# Patient Record
Sex: Female | Born: 1945 | Race: White | Hispanic: No | Smoking: Never smoker
Health system: Southern US, Community
[De-identification: ages and names within clinical notes are randomized; demographics above are authoritative.]

## PROBLEM LIST (undated history)

## (undated) ENCOUNTER — Emergency Department: Admission: EM | Payer: Medicare PPO | Source: Home / Self Care

## (undated) DIAGNOSIS — N2889 Other specified disorders of kidney and ureter: Secondary | ICD-10-CM

## (undated) DIAGNOSIS — E119 Type 2 diabetes mellitus without complications: Secondary | ICD-10-CM

## (undated) DIAGNOSIS — F32A Depression, unspecified: Secondary | ICD-10-CM

## (undated) DIAGNOSIS — I4891 Unspecified atrial fibrillation: Secondary | ICD-10-CM

## (undated) DIAGNOSIS — Z87442 Personal history of urinary calculi: Secondary | ICD-10-CM

## (undated) HISTORY — PX: GASTRIC BYPASS: SHX52

## (undated) HISTORY — DX: Personal history of urinary calculi: Z87.442

## (undated) HISTORY — PX: TONSILLECTOMY: SUR1361

## (undated) HISTORY — DX: Other specified disorders of kidney and ureter: N28.89

## (undated) HISTORY — DX: Type 2 diabetes mellitus without complications: E11.9

## (undated) HISTORY — PX: HERNIA REPAIR: SHX51

## (undated) HISTORY — DX: Depression, unspecified: F32.A

## (undated) HISTORY — DX: Unspecified atrial fibrillation: I48.91

## (undated) HISTORY — PX: URETEROSCOPY WITH HOLMIUM LASER LITHOTRIPSY: SHX6645

---

## 2020-06-20 ENCOUNTER — Emergency Department
Admission: EM | Admit: 2020-06-20 | Discharge: 2020-06-23 | Disposition: A | Discharge status: Home / Self Care | Payer: Medicare PPO | Attending: Emergency Medicine | Admitting: Emergency Medicine

## 2020-06-20 ENCOUNTER — Other Ambulatory Visit: Payer: Self-pay

## 2020-06-20 ENCOUNTER — Emergency Department: Payer: Medicare PPO

## 2020-06-20 DIAGNOSIS — Z7984 Long term (current) use of oral hypoglycemic drugs: Secondary | ICD-10-CM | POA: Diagnosis not present

## 2020-06-20 DIAGNOSIS — R410 Disorientation, unspecified: Secondary | ICD-10-CM | POA: Insufficient documentation

## 2020-06-20 DIAGNOSIS — R2681 Unsteadiness on feet: Secondary | ICD-10-CM | POA: Insufficient documentation

## 2020-06-20 DIAGNOSIS — Z7901 Long term (current) use of anticoagulants: Secondary | ICD-10-CM | POA: Diagnosis not present

## 2020-06-20 DIAGNOSIS — R4182 Altered mental status, unspecified: Secondary | ICD-10-CM | POA: Diagnosis present

## 2020-06-20 DIAGNOSIS — M6281 Muscle weakness (generalized): Secondary | ICD-10-CM | POA: Insufficient documentation

## 2020-06-20 DIAGNOSIS — R739 Hyperglycemia, unspecified: Secondary | ICD-10-CM | POA: Diagnosis not present

## 2020-06-20 DIAGNOSIS — Z794 Long term (current) use of insulin: Secondary | ICD-10-CM | POA: Diagnosis not present

## 2020-06-20 LAB — BASIC METABOLIC PANEL
Anion gap: 10 (ref 5–15)
BUN: 12 mg/dL (ref 8–23)
CO2: 26 mmol/L (ref 22–32)
Calcium: 10.2 mg/dL (ref 8.9–10.3)
Chloride: 98 mmol/L (ref 98–111)
Creatinine, Ser: 0.58 mg/dL (ref 0.44–1.00)
GFR, Estimated: >60 mL/min (ref >60)
Glucose, Bld: 303 mg/dL — ABNORMAL HIGH (ref 70–99)
Potassium: 4.6 mmol/L (ref 3.5–5.1)
Sodium: 134 mmol/L — ABNORMAL LOW (ref 135–145)

## 2020-06-20 LAB — URINALYSIS, COMPLETE (UACMP) WITH MICROSCOPIC
Bilirubin Urine: NEGATIVE
Glucose, UA: >=500 mg/dL — AB
Hgb urine dipstick: NEGATIVE
Ketones, ur: NEGATIVE mg/dL
Leukocytes,Ua: NEGATIVE
Nitrite: NEGATIVE
Protein, ur: NEGATIVE mg/dL
Specific Gravity, Urine: 1.016 (ref 1.005–1.030)
pH: 6 (ref 5.0–8.0)

## 2020-06-20 LAB — CBC
HCT: 39.4 % (ref 36.0–46.0)
Hemoglobin: 12.4 g/dL (ref 12.0–15.0)
MCH: 26.3 pg (ref 26.0–34.0)
MCHC: 31.5 g/dL (ref 30.0–36.0)
MCV: 83.7 fL (ref 80.0–100.0)
Platelets: 195 10*3/uL (ref 150–400)
RBC: 4.71 MIL/uL (ref 3.87–5.11)
RDW: 15.7 % — ABNORMAL HIGH (ref 11.5–15.5)
WBC: 6.5 10*3/uL (ref 4.0–10.5)
nRBC: 0 % (ref 0.0–0.2)

## 2020-06-20 LAB — GLUCOSE, CAPILLARY
Glucose-Capillary: 275 mg/dL — ABNORMAL HIGH (ref 70–99)
Glucose-Capillary: 302 mg/dL — ABNORMAL HIGH (ref 70–99)
Glucose-Capillary: 332 mg/dL — ABNORMAL HIGH (ref 70–99)

## 2020-06-20 MED ORDER — INSULIN ASPART 100 UNIT/ML ~~LOC~~ SOLN: 0.0000 [IU] | Freq: Three times a day (TID) | SUBCUTANEOUS | Status: DC

## 2020-06-20 MED ORDER — MELATONIN 5 MG PO TABS
5.0000 mg | ORAL_TABLET | Freq: Every evening | ORAL | Status: DC | PRN
  Filled 2020-06-20: qty 1

## 2020-06-20 MED ORDER — BISACODYL 5 MG PO TBEC: 5.0000 mg | DELAYED_RELEASE_TABLET | Freq: Every day | ORAL | Status: DC | PRN

## 2020-06-20 MED ORDER — FLUOXETINE HCL 10 MG PO CAPS
10.0000 mg | ORAL_CAPSULE | Freq: Every day | ORAL | Status: DC
  Administered 2020-06-20 – 2020-06-22 (×3): 10 mg via ORAL
  Filled 2020-06-20 (×4): qty 1

## 2020-06-20 MED ORDER — ACETAMINOPHEN ER 650 MG PO TBCR: 650.0000 mg | EXTENDED_RELEASE_TABLET | ORAL | Status: DC | PRN

## 2020-06-20 MED ORDER — MELATONIN 3 MG PO TABS: 3.0000 mg | ORAL_TABLET | Freq: Every evening | ORAL | Status: DC | PRN

## 2020-06-20 MED ORDER — HALOPERIDOL LACTATE 5 MG/ML IJ SOLN: 1.0000 mg | Freq: Once | INTRAMUSCULAR | Status: AC

## 2020-06-20 MED ORDER — HALOPERIDOL LACTATE 5 MG/ML IJ SOLN: 1.0000 mg | Freq: Once | INTRAMUSCULAR | Status: DC

## 2020-06-20 MED ORDER — INSULIN GLARGINE 100 UNIT/ML ~~LOC~~ SOLN
20.0000 [IU] | Freq: Every day | SUBCUTANEOUS | Status: DC
  Administered 2020-06-21 – 2020-06-23 (×3): 20 [IU] via SUBCUTANEOUS
  Filled 2020-06-20 (×4): qty 0.2

## 2020-06-20 MED ORDER — INSULIN ASPART 100 UNIT/ML ~~LOC~~ SOLN
0.0000 [IU] | Freq: Three times a day (TID) | SUBCUTANEOUS | Status: DC
  Administered 2020-06-21: 2 [IU] via SUBCUTANEOUS
  Administered 2020-06-21 – 2020-06-22 (×3): 5 [IU] via SUBCUTANEOUS
  Administered 2020-06-22: 2 [IU] via SUBCUTANEOUS
  Administered 2020-06-23: 1 [IU] via SUBCUTANEOUS
  Filled 2020-06-20 (×6): qty 1

## 2020-06-20 MED ORDER — ACETAMINOPHEN 325 MG PO TABS: 650.0000 mg | ORAL_TABLET | ORAL | Status: DC | PRN

## 2020-06-20 MED ORDER — HALOPERIDOL LACTATE 5 MG/ML IJ SOLN
INTRAMUSCULAR | Status: AC
  Administered 2020-06-20: 1 mg via INTRAMUSCULAR
  Filled 2020-06-20: qty 1

## 2020-06-20 MED ORDER — GLIMEPIRIDE 4 MG PO TABS
4.0000 mg | ORAL_TABLET | Freq: Every day | ORAL | Status: DC
  Administered 2020-06-21 – 2020-06-23 (×3): 4 mg via ORAL
  Filled 2020-06-20 (×4): qty 1

## 2020-06-20 MED ORDER — INSULIN ASPART 100 UNIT/ML ~~LOC~~ SOLN: 0.0000 [IU] | Freq: Every day | SUBCUTANEOUS | Status: DC

## 2020-06-20 MED ORDER — ONDANSETRON HCL 4 MG PO TABS: 4.0000 mg | ORAL_TABLET | Freq: Four times a day (QID) | ORAL | Status: DC | PRN

## 2020-06-20 MED ORDER — METFORMIN HCL ER 500 MG PO TB24
500.0000 mg | ORAL_TABLET | Freq: Every day | ORAL | Status: DC
  Administered 2020-06-21 – 2020-06-23 (×3): 500 mg via ORAL
  Filled 2020-06-20 (×4): qty 1

## 2020-06-20 MED ORDER — INSULIN LISPRO 100 UNIT/ML ~~LOC~~ SOLN: 0.0000 [IU] | Freq: Three times a day (TID) | SUBCUTANEOUS | Status: DC

## 2020-06-20 MED ORDER — METOPROLOL SUCCINATE ER 50 MG PO TB24
50.0000 mg | ORAL_TABLET | Freq: Every day | ORAL | Status: DC
  Administered 2020-06-20 – 2020-06-22 (×3): 50 mg via ORAL
  Filled 2020-06-20 (×2): qty 1

## 2020-06-20 NOTE — ED Triage Notes (Signed)
Patient was sent to assisted living today as a new patient, patient arrived to facility with AMS and elevated BGL. Patient alert to person. Patient agitated with EMS and uncooperative for vitals.

## 2020-06-20 NOTE — ED Provider Notes (Signed)
[facility] Emergency Department Provider Note ____________________________________________   First MD Initiated Contact with Patient 06/20/20 1542     (approximate)  I have reviewed the triage vital signs and the nursing notes.   HISTORY  Chief Complaint Altered Mental Status  EM Caveat: confusion  HPI Barbara Frank is a 74 y.o. female for evaluation after she arrived to an assisted living facility from outside hospital.  Patient tells me that she arrived to a new apartment, a bunch of people showed up and "kidnapped her" taking her to the hospital.  She does tell me she left the hospital earlier today and was brought to [facility] for her new home and her niece  She denies any acute concerns, she tells me she not any pain or discomfort.  She wants to be able to leave soon, but is starting life in a new apartment here in [place].  She does tell me she just left the hospital and was brought from the hospital to her new apartment which is assisted living  No past medical history on file.  There are no problems to display for this patient.     Prior to Admission medications   Medication Sig Start Date End Date Taking? Authorizing Provider  acetaminophen (TYLENOL) 650 MG CR tablet Take 650 mg by mouth every 4 (four) hours as needed for pain or fever.   Yes [provider]  bisacodyl (DULCOLAX) 5 MG EC tablet Take 5 mg by mouth daily as needed for moderate constipation.   Yes [provider]  FLUoxetine (PROZAC) 10 MG capsule Take 10 mg by mouth daily.   Yes [provider]  glimepiride (AMARYL) 4 MG tablet Take 4 mg by mouth daily with breakfast.   Yes [provider]  insulin glargine (LANTUS) 100 UNIT/ML injection Inject 20 Units into the skin daily.   Yes [provider]  insulin lispro (HUMALOG) 100 UNIT/ML injection Inject 0-8 Units into the skin 4 (four) times daily -  with meals and at bedtime. Sliding  scale 0-8 units (No coverage for blood sugars less than 250. Blood sugar 251-300 4 units, 301-350 6 units, 351-400 8 units, more than 400 call MD   Yes [provider]  melatonin 3 MG TABS tablet Take 3 mg by mouth at bedtime as needed (insomnia).   Yes [provider]  metFORMIN (GLUMETZA) 500 MG (MOD) 24 hr tablet Take 500 mg by mouth daily with breakfast.   Yes [provider]  metoprolol succinate (TOPROL-XL) 50 MG 24 hr tablet Take 50 mg by mouth daily. Take with or immediately following a meal.   Yes [provider]  ondansetron (ZOFRAN) 4 MG tablet Take 4 mg by mouth every 6 (six) hours as needed for nausea or vomiting.   Yes [provider]  warfarin (COUMADIN) 10 MG tablet Take 10 mg by mouth daily. Monday, Wednesday, Friday, Saturday, Sunday.   Yes [provider]  warfarin (COUMADIN) 7.5 MG tablet Take 7.5 mg by mouth daily. Tuesday, Thursday   Yes [provider]   Patient came from [facility] in [place] today per [name] (Niece). Patient will be staying at [facility] apartments starting today. Her glucose was > 400 at assisted living. Niece is guardian recently. Patient was just released for stomach pains and discharged today. She had a kidney stone at that time. Also found a possible renal mass and they recommended she see an oncologist in the future.  Niece reports  now guardian, patient recently depressed, and worsening condition since daughter died. No known history of dementia.   Allergies Patient has no known allergies.  No family history on file.  Social History Social History   Tobacco Use  . Smoking status: Not on file  Substance Use Topics  . Alcohol use: Not on file  . Drug use: Not on file  Does not actively smoke or use illegal drugs  Review of Systems Constitutional: No fever/chills reports recently had a stone treated at [facility] and just left that hospital Eyes: No visual  changes. ENT: No sore throat. Cardiovascular: Denies chest pain. Respiratory: Denies shortness of breath. Gastrointestinal: No abdominal pain.   Genitourinary: Negative for dysuria. Musculoskeletal: Negative for back pain. Skin: Negative for rash. Neurological: Negative for headaches, areas of focal weakness or numbness.    ____________________________________________   PHYSICAL EXAM:  VITAL SIGNS: ED Triage Vitals [06/20/20 1457]  Enc Vitals Group     BP      Pulse      Resp      Temp      Temp src      SpO2      Weight      Height      Head Circumference      Peak Flow      Pain Score 0     Pain Loc      Pain Edu?      Excl. in [place]?     Constitutional: Alert and oriented. Well appearing and in no acute distress.  Oriented to being at the hospital, being sent here from her new facility, also to month and date but not to year.  She reports unsure of the year Eyes: Conjunctivae are normal. Head: Atraumatic. Nose: No congestion/rhinnorhea. Mouth/Throat: Mucous membranes are somewhat dry. Neck: No stridor.  Cardiovascular: Normal rate, regular rhythm. Grossly normal heart sounds.  Good peripheral circulation. Respiratory: Normal respiratory effort.  No retractions. Lungs CTAB. Gastrointestinal: Soft and nontender. No distention. Musculoskeletal: No lower extremity tenderness nor edema. Neurologic:  Normal speech and language. No gross focal neurologic deficits are appreciated.  Skin:  Skin is warm, dry and intact. No rash noted. Psychiatric: Mood and affect are slightly anxious, calm and easily when spoken to and allows examination with her permission.  She has a Actuary at the bedside.  She does seem a little bit irritable, or upset easily, but participates in exam denies pain denies being any discomfort.  She is not fully oriented and that she is not quite sure of the year  ____________________________________________   LABS (all labs ordered are listed, but only  abnormal results are displayed)  Labs Reviewed  CBC - Abnormal; Notable for the following components:      Result Value   RDW 15.7 (*)    All other components within normal limits  URINALYSIS, COMPLETE (UACMP) WITH MICROSCOPIC - Abnormal; Notable for the following components:   Color, Urine YELLOW (*)    APPearance CLEAR (*)    Glucose, UA >=500 (*)    Bacteria, UA RARE (*)    All other components within normal limits  GLUCOSE, CAPILLARY - Abnormal; Notable for the following components:   Glucose-Capillary 302 (*)    All other components within normal limits  GLUCOSE, CAPILLARY - Abnormal; Notable for the following components:   Glucose-Capillary 275 (*)    All other components within normal limits  BASIC METABOLIC PANEL - Abnormal; Notable for the following components:   Sodium  134 (*)    Glucose, Bld 303 (*)    All other components within normal limits  GLUCOSE, CAPILLARY - Abnormal; Notable for the following components:   Glucose-Capillary 332 (*)    All other components within normal limits  CBG MONITORING, ED  CBG MONITORING, ED   ____________________________________________  EKG  Patient refuses this intervention. ____________________________________________  RADIOLOGY  Patient refused CT the head.  Please see my note regarding discussion with the patient's healthcare power of attorney who is her niece that assists in her medical decision ____________________________________________   PROCEDURES  Procedure(s) performed: None  Procedures  Critical Care performed: No  ____________________________________________   INITIAL IMPRESSION / ASSESSMENT AND PLAN / ED COURSE  Pertinent labs & imaging results that were available during my care of the patient were reviewed by me and considered in my medical decision making (see chart for details).   Patient sent for evaluation for concerns of altered mental status from a brand-new facility that she checked into  today.  Spoken with her family, they are coming to see her and we will place social work consult, she is recently had medical evaluation including discharged today from [facility] per family.  This was for a kidney stone.  She been doing okay but they are moving [place]  Overall she has a very soft reassuring exam, she does not appear acutely unstable, she is just slightly oriented or slightly confused and her niece reports that they have noticed a little bit of confusion for her are a little bit of a change in the last year but not diagnosed dementia.   Will evaluate and screen for medical etiologies but based on the clinical history provided I suspect the new care environment and recent transition from hospital to assisted living may be a contributory factor.  Discussed plan of care with niece who is guardian who is in agreement, she is also coming to the hospital.  Patient understanding  Clinical Course as of Jun 20 2345  Fri Jun 20, 2020  1700 Patient currently refusing head CT.   [MQ]  1749 Niece at the bedside, also received medical records from [place].  Patient is known diabetic was admitted and had on her assessment periods of confusion in the hospital also noted, also gait instability, and concern for possible renal cell carcinoma and nephrolithiasis.  Await laboratory panel, but CBC appears normal.  Patient refused head CT I discussed this with his niece with her niece who is her healthcare power of attorney/guardian now and she is okay with [place] for going head CT with a focus at this point to try to find her appropriate housing or care moving forward.  We discussed not obtaining a head CT could miss causes such as stroke, injury though no fall has been reported and she was evidently delivered from hospital to her assisted living facility today escorted the entire way, and other etiologies such as tumor, but given the goals of care and  discussion with niece at the bedside primary goal at this point is to find her appropriate care such as assisted living or nursing facility, niece does report there was some potential opportunity for her to go to a hospice facility in [place] which was also offered while she was at [place]   [MQ]  2001 Nursing had to have labs redrawn, awaiting redraw.  Home meds reordered.  Patient resting, would like something to eat.  Niece at bedside,  discussed with them our current plan is to await her pending lab tests including urinalysis and BMP, allow her to eat and drink, and further care coordination through social work team.   [MQ]    Clinical Course User Index [MQ] Delman Kitten, MD   ----------------------------------------- 9:42 PM on 06/20/2020 -----------------------------------------  In discussion with the patient's niece who is healthcare power of attorney, and the patient we do not wish for further work-up such as head CT but rather focus to this finding appropriate care setting for the patient especially in the setting and she was recently seen and evaluated at outside hospital, spent 2 weeks there being evaluated.  I think this is appropriate.  We discussed evaluation with CT would be helpful in evaluating to exclude etiology such as tumor, brain cancer, stroke, but also appears that an acute central neurologic etiology is unlikely to contribute to her cause for evaluation today.  I was able to review notes from her admission at [facility], able to review her discharge summary it does appear that she also had noted history of confusion some difficulty with gait there as well.  There is also concerns for possible renal cancer developing which niece is also aware of.  They are currently working and have placed a consult to our social work team to assist in finding appropriate placement.  Hopefully she will be able to return to her care facility which she is just found in [place] with additional  assistance and social/Case management, but other options may exist as family also reports she was offered possible placement at a hospice facility in [place] while she was at [place].  Ongoing care signed Dr. Joni Fears, patient being observed with social work team assistance to help with disposition.  Patient was wandering about, difficult to redirect back to her room, did require small dose of intramuscular Haldol for relief of patient's agitation.  I am suspicious patient may be experiencing some type of sundowning type pattern.  ____________________________________________   FINAL CLINICAL IMPRESSION(S) / ED DIAGNOSES  Final diagnoses:  Hyperglycemia  Confusion        Note:  This document was prepared using Dragon voice recognition software and may include unintentional dictation errors       Delman Kitten, MD 06/20/20 2348

## 2020-06-20 NOTE — ED Notes (Signed)
Unable to obtain vitals due to AMS, patient refused and very defensive.

## 2020-06-20 NOTE — ED Notes (Signed)
Contacted lab to come straight stick patient. Unsuccessful IV Stick.

## 2020-06-20 NOTE — ED Notes (Signed)
Patient's niece stated that she is in parking lot and will be here shortly.

## 2020-06-21 LAB — GLUCOSE, CAPILLARY
Glucose-Capillary: 254 mg/dL — ABNORMAL HIGH (ref 70–99)
Glucose-Capillary: 262 mg/dL — ABNORMAL HIGH (ref 70–99)
Glucose-Capillary: 152 mg/dL — ABNORMAL HIGH (ref 70–99)
Glucose-Capillary: 282 mg/dL — ABNORMAL HIGH (ref 70–99)
Glucose-Capillary: 179 mg/dL — ABNORMAL HIGH (ref 70–99)

## 2020-06-21 NOTE — ED Notes (Signed)
Pt visualized sleeping, rise and fall of chest noted.

## 2020-06-21 NOTE — ED Notes (Signed)
Pt assisted up to commode to void, hands washed, old meal tray discarded. Pt assisted back to bed and fall alarm turned back on. siderails up, non skid yellow socks in place.

## 2020-06-21 NOTE — ED Notes (Signed)
Pt up walking around ED with this RN and offgoing RN. Pt states "looking for her partner [name]". Pt reasured family went home. Pt used restroom, sample sent to lab.  Pt refusing VS and refusing to go back into bedroom. Charge RN aware, sitter requested.

## 2020-06-21 NOTE — ED Notes (Addendum)
Social work at bedside speaking to pt and niece.

## 2020-06-21 NOTE — Progress Notes (Signed)
Inpatient Diabetes Program Recommendations  AACE/ADA: New Consensus Statement on Inpatient Glycemic Control (2015)  Target Ranges:  Prepandial:   less than 140 mg/dL      Peak postprandial:   less than 180 mg/dL (1-2 hours)      Critically ill patients:  140 - 180 mg/dL   Lab Results  Component Value Date   GLUCAP 262 (H) 06/21/2020    Review of Glycemic Control Results for Barbara Frank, RIDINGS (MRN [id number]) as of 06/21/2020 09:24  Ref. Range 06/20/2020 18:52 06/20/2020 23:39 06/21/2020 05:57 06/21/2020 08:34  Glucose-Capillary Latest Ref Range: 70 - 99 mg/dL 275 (H) 332 (H) 254 (H) 262 (H)   Diabetes history: DM 2 Outpatient Diabetes medications:  Amaryl 4 mg daily, Lantus 20 units daily, Humalog 0-8 units four times a day, Glumetza 500 mg daily Current orders for Inpatient glycemic control:  Novolog sensitive tid with meals Amaryl 4 mg daily Lantus 20 units daily Metformin XR 500 mg daily  Inpatient Diabetes Program Recommendations:    Referral received.  Agree with current orders.  Will need to make sure she has assistance with DM medications at SNF or ALF.   Thanks,  Adah Perl, RN, BC-ADM Inpatient Diabetes Coordinator Pager [phone] (8a-5p)

## 2020-06-21 NOTE — ED Notes (Addendum)
Patient assessed and found to be sleeping. Patient wakes easily to verbal stimuli and responds to questions and commands appropriately. Pt in hospital bed with bed low and locked and siderails raised. Call bell is in reach and pulse ox/bp cuff is in place. Pt is breathing unlabored with symmetric chest rise and fall. Pt denies pain or needs at this time. RN to continue to monitor.

## 2020-06-21 NOTE — ED Notes (Signed)
Family and pt updated on plan of care °

## 2020-06-21 NOTE — ED Notes (Signed)
Pt noted to be sleeping. Rise and fall of chest noted, NAD noted.

## 2020-06-21 NOTE — ED Notes (Signed)
Pt resting quietly at this time. Pt denies any needs at this time. Family at bedside. Call bell in reach.

## 2020-06-21 NOTE — ED Notes (Signed)
Pt sitting in hallway with NT. Pt allowed this RN to check BG but is refusing insulin coverage. Pt is refusing VS and wanting to stay up in chair in hallway.

## 2020-06-21 NOTE — TOC Progression Note (Signed)
Transition of Care [facility]) - Progression Note    Patient Details  Name: Barbara Frank MRN: [id number] Date of Birth: Aug 02, 1946  Transition of Care [facility]) CM/SW Contact  Truitt Merle, LCSW Phone Number: 06/21/2020, 4:45 PM  Clinical Narrative:    LCSW spoke with patient and niece-Julie Cates [phone]) at bedside and explained role of TOC department in patient care. Patient recently moved to [place] from [facility], where she was admitted for several weeks due to kidney stone removal. Before being hospitalized in [place] patient lived in a house alone where there were concerns about the state of the home (hoarding), per niece. Adult Protective Services (APS) was active with patient and transported patient to [facility] from [facility] on 10/15, per niece. Patient had not had her insurlin, eaten, or had anything to drink for the 4 hours drive per niece and patient report. Staffed with RN Jacquelyn and Dr. Quentin Cornwall.   Patient observed to be alert and oriented. Very supportive niece, but works full-time and dealing with her own medical concerns. Patient stated she has been able to perform ADLs with assistance and administer her own medication before coming to [facility]. Patient also able to ambulate with limited assistance per patient and niece. Patient and niece wanting to return to [facility] upon discharge. Niece already working with facility management to put additional support in place for patient.   Spoke with Myles Gip at [facility] [phone]) who confirmed patient is able to return, but not until Monday due to support services not being available to initiate in the weekend. Per Myles Gip, patient to be assessed by [facility] to assess for assistance needs with ADLs. Patient also to be assessed by [facility] for physical therapy needs. Tenecia asked that patient be provided Rx for her  medication, as patient arrived with no medication from home. Updated RN Jacquelyn and Dr. Quentin Cornwall. P/T order placed.      Expected Discharge Plan and Services  Return to [facility], upon discharge.                                               Social Determinants of Health (SDOH) Interventions    Readmission Risk Interventions No flowsheet data found.

## 2020-06-21 NOTE — ED Notes (Signed)
Pt resting comfortably at this time. No distress noted. Pt denies any needs at this time. Call bell in reach.

## 2020-06-21 NOTE — ED Notes (Signed)
Patient assisted to the bathroom 

## 2020-06-21 NOTE — ED Notes (Signed)
Pt given IM medication by charge RN. Pt refusing to leave hallway and disrupting other patients.

## 2020-06-21 NOTE — ED Notes (Signed)
Patient sleeping free from sign of distress. Breathing remains easy and unlabored and symmetric chest rise and fall noted. Patient remains in hospital bed with bed low and locked and side rails raised x3. BP cuff and pulse ox in place and call bell in reach. No further change in patient condition noted.

## 2020-06-22 LAB — GLUCOSE, CAPILLARY
Glucose-Capillary: 273 mg/dL — ABNORMAL HIGH (ref 70–99)
Glucose-Capillary: 172 mg/dL — ABNORMAL HIGH (ref 70–99)

## 2020-06-22 NOTE — ED Notes (Signed)
Family member at bedside.

## 2020-06-22 NOTE — ED Notes (Signed)
Pt resting in bed. Pt cooperative with taking meds. Provided water to pt.

## 2020-06-22 NOTE — ED Notes (Signed)
Helped pt to toilet. Peri care performed and pt changed pants.

## 2020-06-22 NOTE — Evaluation (Signed)
Physical Therapy Evaluation Patient Details Name: Barbara Frank MRN: [id number] DOB: 05/17/1946 Today's Date: 06/22/2020   History of Present Illness  Patient is a 74 y.o. female presenting for evaluation after she arrived to an assisted living facility from outside hospital with AMS, hyperglycemia. Patient with past medical history of recent hospitilization at outside hospital with periods of confusion, gait instability, and possible renal cell carcinoma and nephrolithiasis with discharge plaement at a new facility.   Clinical Impression  Patient is pleasant, cooperative, and able to follow single step commands without difficulty. Per chart, patient recently discharged from outside hospital to ALF. Patient is Mod I for bed mobility. Supervision provided for sit to stand transfers for safety. Patient ambulated with hand held assistance around the room. Patient participated with LE seated exercises for strengthening. Recommend supervision for OOB/mobility at this time for safety and fall prevention at ALF and physical therapy at facility if possible. PT will continue to follow to address functional limitations listed below to maximize independence in preparation for discharge to next setting.     Follow Up Recommendations Other (comment);Supervision for mobility/OOB ([place] return to ALF with supervision for mobility, PT )    Equipment Recommendations  None recommended by PT    Recommendations for Other Services       Precautions / Restrictions Precautions Precautions: Fall Restrictions Weight Bearing Restrictions: No      Mobility  Bed Mobility   Bed Mobility: Sit to Supine;Supine to Sit     Supine to sit: Modified independent (Device/Increase time);HOB elevated Sit to supine: Modified independent (Device/Increase time);HOB elevated      Transfers Overall transfer level: Needs assistance Equipment used: None Transfers: Sit to/from Stand Sit to Stand: Supervision          General transfer comment: supervision for safety   Ambulation/Gait Ambulation/Gait assistance: Min guard Gait Distance (Feet): 50 Feet Assistive device: 1 person hand held assist Gait Pattern/deviations: Decreased stride length Gait velocity: decreased   General Gait Details: patient ambulated with hand held assistance with cues for safety   Stairs            Wheelchair Mobility    Modified Rankin (Stroke Patients Only)       Balance Overall balance assessment: Needs assistance Sitting-balance support: Feet supported Sitting balance-Leahy Scale: Normal     Standing balance support: Single extremity supported Standing balance-Leahy Scale: Fair Standing balance comment: CGA provided for safety                              Pertinent Vitals/Pain Pain Assessment: No/denies pain    Home Living Family/patient expects to be discharged to:: Private residence Living Arrangements:  (ALF per the chart review )                    Prior Function Level of Independence: Independent         Comments: patient reports she was independent with ambulation prior to admission without assistive devices      Hand Dominance   Dominant Hand: Right    Extremity/Trunk Assessment   Upper Extremity Assessment Upper Extremity Assessment: Generalized weakness    Lower Extremity Assessment Lower Extremity Assessment: Generalized weakness       Communication   Communication: No difficulties  Cognition Arousal/Alertness: Awake/alert Behavior During Therapy: WFL for tasks assessed/performed Overall Cognitive Status: No family/caregiver present to determine baseline cognitive functioning  General Comments: patient is pleasant and able to follow single step commands without difficulty. patient is oriented to self, place.       General Comments      Exercises General Exercises - Lower Extremity Ankle  Circles/Pumps: AROM;Strengthening;Both;10 reps;Seated Long Arc Quad: AAROM;Strengthening;Both;10 reps;Seated Hip Flexion/Marching: AAROM;Strengthening;Both;10 reps;Seated Other Exercises Other Exercises: verbal and tactile cues for exercise technique    Assessment/Plan    PT Assessment Patient needs continued PT services  PT Problem List Decreased strength;Decreased activity tolerance;Decreased balance;Decreased mobility;Decreased cognition;Decreased safety awareness;Decreased knowledge of precautions       PT Treatment Interventions DME instruction;Gait training;Stair training;Functional mobility training;Therapeutic activities;Therapeutic exercise;Balance training;Neuromuscular re-education;Patient/family education    PT Goals (Current goals can be found in the Care Plan section)  Acute Rehab PT Goals Patient Stated Goal: to return to apartment  PT Goal Formulation: With patient Time For Goal Achievement: 07/06/20 Potential to Achieve Goals: Good    Frequency Min 2X/week   Barriers to discharge        Co-evaluation               AM-PAC PT "6 Clicks" Mobility  Outcome Measure Help needed turning from your back to your side while in a flat bed without using bedrails?: None Help needed moving from lying on your back to sitting on the side of a flat bed without using bedrails?: None Help needed moving to and from a bed to a chair (including a wheelchair)?: A Little Help needed standing up from a chair using your arms (e.g., wheelchair or bedside chair)?: A Little Help needed to walk in hospital room?: A Little Help needed climbing 3-5 steps with a railing? : A Little 6 Click Score: 20    End of Session   Activity Tolerance: Patient tolerated treatment well Patient left: in bed;with bed alarm set;with call bell/phone within reach Nurse Communication: Mobility status PT Visit Diagnosis: Muscle weakness (generalized) (M62.81);Unsteadiness on feet (R26.81)    Time:  3202-3343 PT Time Calculation (min) (ACUTE ONLY): 17 min   Charges:   PT Evaluation $PT Eval Low Complexity: 1 Low PT Treatments $Therapeutic Exercise: 8-22 mins       Minna Merritts, PT, MPT   Percell Locus 06/22/2020, 10:31 AM

## 2020-06-22 NOTE — ED Notes (Signed)
Niece (POA) called. Wanted to know more or less what time tomorrow for pickup. Spoke with EDP who said likely during first part of day tomorrow. Informed niece.

## 2020-06-23 LAB — GLUCOSE, CAPILLARY
Glucose-Capillary: 185 mg/dL — ABNORMAL HIGH (ref 70–99)
Glucose-Capillary: 130 mg/dL — ABNORMAL HIGH (ref 70–99)

## 2020-06-23 NOTE — ED Notes (Signed)
This RN at bedside to assist pt to the bathroom. This RN asking pt to place mask on face to ensure safety when walking through the hall. Pt stating, "I will not. You will not talk to me like that. Put this thing down." This RN assuring pt it is for her safety. Pt stating, "I'm doing it for me not you." This RN attempting to assist pt out of bed. Pt stating, "I got this, I don't need your help I walk just fine." This RN stating assistance is to ensure she was steady and did not fall. Pt stating, "I don't want it." Pt walking through hall to bathroom holding onto walls and nurses station counter. This RN offering to assist again and pt declines.

## 2020-06-23 NOTE — ED Notes (Signed)
This RN spoke with pt's daughter and POA for transportation upon discharge.

## 2020-06-23 NOTE — ED Notes (Signed)
Waiting for breakfast tray to give 0800 meds

## 2020-06-23 NOTE — ED Notes (Signed)
Missing glimepiride and metformin. Messaged pharmacy to tube medications to ED.

## 2020-07-04 ENCOUNTER — Other Ambulatory Visit: Payer: Self-pay | Admitting: Family Medicine

## 2020-07-04 DIAGNOSIS — Z1231 Encounter for screening mammogram for malignant neoplasm of breast: Secondary | ICD-10-CM

## 2020-07-14 ENCOUNTER — Encounter: Payer: Self-pay | Admitting: *Deleted

## 2020-09-26 ENCOUNTER — Encounter (INDEPENDENT_AMBULATORY_CARE_PROVIDER_SITE_OTHER): Payer: Self-pay

## 2020-09-26 ENCOUNTER — Inpatient Hospital Stay: Payer: Medicare PPO

## 2020-09-26 ENCOUNTER — Ambulatory Visit
Admission: RE | Admit: 2020-09-26 | Discharge: 2020-09-26 | Disposition: A | Discharge status: Home / Self Care | Payer: Self-pay | Source: Ambulatory Visit | Attending: Oncology | Admitting: Oncology

## 2020-09-26 ENCOUNTER — Other Ambulatory Visit: Payer: Self-pay

## 2020-09-26 ENCOUNTER — Encounter: Payer: Self-pay | Admitting: Oncology

## 2020-09-26 ENCOUNTER — Telehealth: Payer: Self-pay

## 2020-09-26 ENCOUNTER — Inpatient Hospital Stay: Payer: Medicare PPO | Attending: Oncology | Admitting: Oncology

## 2020-09-26 VITALS — BP 132/80 | HR 75 | Temp 97.0°F | Resp 18 | Wt 169.3 lb

## 2020-09-26 DIAGNOSIS — Z7189 Other specified counseling: Secondary | ICD-10-CM

## 2020-09-26 DIAGNOSIS — F32A Depression, unspecified: Secondary | ICD-10-CM | POA: Diagnosis not present

## 2020-09-26 DIAGNOSIS — N2889 Other specified disorders of kidney and ureter: Secondary | ICD-10-CM

## 2020-09-26 DIAGNOSIS — I4891 Unspecified atrial fibrillation: Secondary | ICD-10-CM | POA: Diagnosis not present

## 2020-09-26 DIAGNOSIS — Z803 Family history of malignant neoplasm of breast: Secondary | ICD-10-CM | POA: Diagnosis not present

## 2020-09-26 DIAGNOSIS — C649 Malignant neoplasm of unspecified kidney, except renal pelvis: Secondary | ICD-10-CM | POA: Insufficient documentation

## 2020-09-26 DIAGNOSIS — R410 Disorientation, unspecified: Secondary | ICD-10-CM | POA: Insufficient documentation

## 2020-09-26 DIAGNOSIS — Z7901 Long term (current) use of anticoagulants: Secondary | ICD-10-CM | POA: Insufficient documentation

## 2020-09-26 DIAGNOSIS — R599 Enlarged lymph nodes, unspecified: Secondary | ICD-10-CM | POA: Diagnosis not present

## 2020-09-26 DIAGNOSIS — E876 Hypokalemia: Secondary | ICD-10-CM | POA: Insufficient documentation

## 2020-09-26 DIAGNOSIS — I480 Paroxysmal atrial fibrillation: Secondary | ICD-10-CM | POA: Insufficient documentation

## 2020-09-26 DIAGNOSIS — R627 Adult failure to thrive: Secondary | ICD-10-CM | POA: Diagnosis not present

## 2020-09-26 DIAGNOSIS — N201 Calculus of ureter: Secondary | ICD-10-CM | POA: Insufficient documentation

## 2020-09-26 DIAGNOSIS — IMO0002 Reserved for concepts with insufficient information to code with codable children: Secondary | ICD-10-CM | POA: Insufficient documentation

## 2020-09-26 DIAGNOSIS — C641 Malignant neoplasm of right kidney, except renal pelvis: Secondary | ICD-10-CM | POA: Insufficient documentation

## 2020-09-26 LAB — CBC WITH DIFFERENTIAL/PLATELET
Abs Immature Granulocytes: 0.04 10*3/uL (ref 0.00–0.07)
Basophils Absolute: 0 10*3/uL (ref 0.0–0.1)
Basophils Relative: 0 %
Eosinophils Absolute: 0.2 10*3/uL (ref 0.0–0.5)
Eosinophils Relative: 2 %
HCT: 41.2 % (ref 36.0–46.0)
Hemoglobin: 13.6 g/dL (ref 12.0–15.0)
Immature Granulocytes: 1 %
Lymphocytes Relative: 25 %
Lymphs Abs: 1.8 10*3/uL (ref 0.7–4.0)
MCH: 27 pg (ref 26.0–34.0)
MCHC: 33 g/dL (ref 30.0–36.0)
MCV: 81.9 fL (ref 80.0–100.0)
Monocytes Absolute: 0.6 10*3/uL (ref 0.1–1.0)
Monocytes Relative: 8 %
Neutro Abs: 4.8 10*3/uL (ref 1.7–7.7)
Neutrophils Relative %: 64 %
Platelets: 230 10*3/uL (ref 150–400)
RBC: 5.03 MIL/uL (ref 3.87–5.11)
RDW: 13.5 % (ref 11.5–15.5)
WBC: 7.5 10*3/uL (ref 4.0–10.5)
nRBC: 0 % (ref 0.0–0.2)

## 2020-09-26 LAB — COMPREHENSIVE METABOLIC PANEL
ALT: 13 U/L (ref 0–44)
AST: 17 U/L (ref 15–41)
Albumin: 3.6 g/dL (ref 3.5–5.0)
Alkaline Phosphatase: 98 U/L (ref 38–126)
Anion gap: 7 (ref 5–15)
BUN: 16 mg/dL (ref 8–23)
CO2: 27 mmol/L (ref 22–32)
Calcium: 10.8 mg/dL — ABNORMAL HIGH (ref 8.9–10.3)
Chloride: 99 mmol/L (ref 98–111)
Creatinine, Ser: 0.7 mg/dL (ref 0.44–1.00)
GFR, Estimated: >60 mL/min (ref >60)
Glucose, Bld: 426 mg/dL — ABNORMAL HIGH (ref 70–99)
Potassium: 4.5 mmol/L (ref 3.5–5.1)
Sodium: 133 mmol/L — ABNORMAL LOW (ref 135–145)
Total Bilirubin: 0.6 mg/dL (ref 0.3–1.2)
Total Protein: 6.8 g/dL (ref 6.5–8.1)

## 2020-09-26 LAB — URINALYSIS, COMPLETE (UACMP) WITH MICROSCOPIC
Bacteria, UA: NONE SEEN
Bilirubin Urine: NEGATIVE
Glucose, UA: >=500 mg/dL — AB
Hgb urine dipstick: NEGATIVE
Ketones, ur: NEGATIVE mg/dL
Leukocytes,Ua: NEGATIVE
Nitrite: NEGATIVE
Protein, ur: NEGATIVE mg/dL
Specific Gravity, Urine: 1.033 — ABNORMAL HIGH (ref 1.005–1.030)
pH: 5 (ref 5.0–8.0)

## 2020-09-26 LAB — LACTATE DEHYDROGENASE: LDH: 92 U/L — ABNORMAL LOW (ref 98–192)

## 2020-09-26 NOTE — Telephone Encounter (Signed)
[facility] radiology dept at [phone] to upload images via power share.  The have CT chest/abdomen/pelvis from 06/06/21 and 06/09/21.

## 2020-09-26 NOTE — Telephone Encounter (Signed)
Images have been up lodaded and available to view.

## 2020-09-26 NOTE — Progress Notes (Signed)
New patient evaluation.  Current medications confirmed with her pharmacy [facility].

## 2020-09-26 NOTE — Progress Notes (Signed)
Hematology/Oncology Consult note [facility] [phone] [phone]   Patient Care Team: Sheral Apley as PCP - General  REFERRING PROVIDER: Virgie Dad, FNP  CHIEF COMPLAINTS/REASON FOR VISIT:  Evaluation of right kidney mass  HISTORY OF PRESENTING ILLNESS:   Barbara Frank is a  75 y.o.  female with PMH listed below was seen in consultation at the request of  Virgie Dad, FNP  for evaluation of right kidney mass.   Patient is a poor historian. Her mood appears to be depressed.  She currently lives at assisted living facility. Moved from [place]. Widowed 3 years ago.  Her power of attorney is [name] who is her husband's niece. [name] was called during this encounter.   Extensive medical record review was performed. She has had multiple images done during the past few months, 05/27/20 CT head wo contrast- no acute intracranial abnormality.  06/01/20 CT abdomen and pelvis with contrast 4 mm proximal left ureteral calculus causing mild proximal left- sided hydroureteronephrosis with periureteric and perinephric stranding on the left.5.2cm enhancing right mid anterior kidney mass. Mildly enlarged right retroperitoneal retrocaval lymph nodes suspicious for nodal metastases Mild cardiomegaly, Small hiatal hernia. Status post gastric bypass surgery and cholecystectomy. Postoperative changes anterior abdominal wall. 06/05/20 CT head wo contrast - No acute intracranial abnormality. Probable tiny right frontal scalp Contusion. 2. Mild global volume loss and minimal chronic ischemic small vessel disease. 06/06/20 CT abdomen pelvis wo contrast 1. Indeterminate lesion in the right kidney which is stable may be a solid mass., 2. Partially calcified uterine fibroids. 3. Left UVJ calculus measuring 5 mm 06/07/20 CT head wo contrast.  1. No acute intracranial abnormality. No significant change. 2. Intracranial atherosclerosis, atrophy and chronic  small vessel ischemic changes. 3. Chronic/subacute right maxillary sinus disease.  06/07/20 Cystoscopy by [name] for Ureteroscopic stone extraction Admitted on 10/3/21due to failure to thrive,  anorexia, dehydration, electrolyte imbalances.   06/09/20 CT abdomen and chest with contrast 1. Right renal mass is stable in size. Most suggestive of a renal neoplasm. 2. Right retroperitoneal lymph nodes are stable in size; may well be metastatic. 3. Left hydronephrosis and hydroureter is stable. Pelvis is not imaged therefore if there is a distal calculus still present, it is not visible. 4. Sclerotic lesions in the anterior left seventh, eighth and ninth ribs which given distribution are most likely healed rib fractures.  She is on Eliquis 5mg  Bid for atrial fibrillation. She reports not taking anti depressants that were given by her PCP.   Review of Systems  Constitutional: Positive for fatigue. Negative for appetite change, chills and fever.  HENT:   Negative for hearing loss and voice change.   Eyes: Negative for eye problems.  Respiratory: Negative for chest tightness and cough.   Cardiovascular: Negative for chest pain.  Gastrointestinal: Negative for abdominal distention, abdominal pain and blood in stool.  Endocrine: Negative for hot flashes.  Genitourinary: Negative for difficulty urinating and frequency.   Musculoskeletal: Negative for arthralgias.  Skin: Negative for itching and rash.  Neurological: Negative for extremity weakness.  Hematological: Negative for adenopathy.  Psychiatric/Behavioral: Positive for depression. Negative for confusion.    MEDICAL HISTORY:  Past Medical History:  Diagnosis Date  . A-fib ([place])   . Depression   . Diabetes mellitus without complication ([place])   . History of kidney stones   . Kidney mass     SURGICAL HISTORY: Past Surgical History:  Procedure Laterality Date  . GASTRIC BYPASS    .  KIDNEY STONE SURGERY      SOCIAL  HISTORY: Social History   Socioeconomic History  . Marital status: Unknown    Spouse name: Not on file  . Number of children: Not on file  . Years of education: Not on file  . Highest education level: Not on file  Occupational History  . Not on file  Tobacco Use  . Smoking status: Never Smoker  . Smokeless tobacco: Not on file  Vaping Use  . Vaping Use: Never used  Substance and Sexual Activity  . Alcohol use: Not Currently  . Drug use: Not Currently  . Sexual activity: Not Currently  Other Topics Concern  . Not on file  Social History Narrative  . Not on file   Social Determinants of Health   Financial Resource Strain: Not on file  Food Insecurity: Not on file  Transportation Needs: Not on file  Physical Activity: Not on file  Stress: Not on file  Social Connections: Not on file  Intimate Partner Violence: Not on file    FAMILY HISTORY: Family History  Problem Relation Age of Onset  . Breast cancer Mother     ALLERGIES:  has No Known Allergies.  MEDICATIONS:  Current Outpatient Medications  Medication Sig Dispense Refill  . acetaminophen (TYLENOL) 650 MG CR tablet Take 650 mg by mouth every 4 (four) hours as needed for pain or fever.    . cyanocobalamin (,VITAMIN B-12,) 1000 MCG/ML injection Inject into the muscle.    Marland Kitchen ELIQUIS 5 MG TABS tablet Take 5 mg by mouth 2 (two) times daily.    . insulin glargine (LANTUS) 100 UNIT/ML injection Inject 25 Units into the skin 2 (two) times daily.    . metFORMIN (GLUCOPHAGE) 850 MG tablet Take 850 mg by mouth 2 (two) times daily with a meal.    . metoprolol succinate (TOPROL-XL) 50 MG 24 hr tablet Take 50 mg by mouth daily. Take with or immediately following a meal.     No current facility-administered medications for this visit.     PHYSICAL EXAMINATION: ECOG PERFORMANCE STATUS: 2 - Symptomatic, <50% confined to bed Vitals:   09/26/20 1123  BP: 132/80  Pulse: 75  Resp: 18  Temp: (!) 97 F (36.1 C)  SpO2: 98%    Filed Weights   09/26/20 1123  Weight: 169 lb 4.8 oz (76.8 kg)    Physical Exam Constitutional:      General: She is not in acute distress.    Comments: Frail elderly female walks with a walker.   HENT:     Head: Normocephalic and atraumatic.  Eyes:     General: No scleral icterus. Cardiovascular:     Rate and Rhythm: Normal rate. Rhythm irregular.     Heart sounds: Normal heart sounds.  Pulmonary:     Effort: Pulmonary effort is normal. No respiratory distress.     Breath sounds: No wheezing.  Abdominal:     General: Bowel sounds are normal. There is no distension.     Palpations: Abdomen is soft.  Musculoskeletal:        General: No deformity. Normal range of motion.     Cervical back: Normal range of motion and neck supple.  Skin:    General: Skin is warm and dry.     Findings: No erythema or rash.  Neurological:     Mental Status: She is alert. Mental status is at baseline.     Cranial Nerves: No cranial nerve deficit.  Coordination: Coordination normal.     LABORATORY DATA:  I have reviewed the data as listed Lab Results  Component Value Date   WBC 7.5 09/26/2020   HGB 13.6 09/26/2020   HCT 41.2 09/26/2020   MCV 81.9 09/26/2020   PLT 230 09/26/2020   Recent Labs    06/20/20 2005 09/26/20 1243  NA 134* 133*  K 4.6 4.5  CL 98 99  CO2 26 27  GLUCOSE 303* 426*  BUN 12 16  CREATININE 0.58 0.70  CALCIUM 10.2 10.8*  GFRNONAA >60 >60  PROT  --  6.8  ALBUMIN  --  3.6  AST  --  17  ALT  --  13  ALKPHOS  --  98  BILITOT  --  0.6   Iron/TIBC/Ferritin/ %Sat No results found for: IRON, TIBC, FERRITIN, IRONPCTSAT    RADIOGRAPHIC STUDIES: I have personally reviewed the radiological images as listed and agreed with the findings in the report. No results found.    ASSESSMENT & PLAN:  1. Right kidney mass   2. Atrial fibrillation, unspecified type ([place])   3. Failure to thrive in adult   4. Goals of care, counseling/discussion    Will obtain CT  images from outside facility.  Right kidney mass, 5.2, with retroperitoneal lymphadenopathy, suspect renal cell carcinoma.  cT1b N1 M0 Recommend patient to establish care with urology.  Lengthy discussion with patient, as well as her [place]. Patient is undecided if she wants to proceed with aggressive diagnostic work up and treatment but is willing to talk to urology about possible procedure.   A Fib on Eliquis 5mg  BID.  Depression/greiving. Currently not on any medication as she does not want to take.  Recommend patient to establish care with local PCP for further discussion.  Refer to palliative care.   Check cbc cmp [facility] Orders Placed This Encounter  Procedures  . CT OUTSIDE FILMS BODY/ABD/PELVIS    CT chest/abdomen/pelvis    Standing Status:   Future    Number of Occurrences:   1    Standing Expiration Date:   09/26/2021    Order Specific Question:   Where was this CD imported?    Answer:   [facility]  . Comprehensive metabolic panel    Standing Status:   Future    Number of Occurrences:   1    Standing Expiration Date:   09/26/2021  . CBC with Differential/Platelet    Standing Status:   Future    Number of Occurrences:   1    Standing Expiration Date:   09/26/2021  . Lactate dehydrogenase    Standing Status:   Future    Number of Occurrences:   1    Standing Expiration Date:   09/26/2021  . Urinalysis, Complete w Microscopic    Standing Status:   Future    Number of Occurrences:   1    Standing Expiration Date:   09/26/2021  . Ambulatory Referral to Palliative Care    Referral Priority:   Routine    Referral Type:   Consultation    Referred to Provider:   Borders, Kirt Boys, NP    Number of Visits Requested:   1  . Ambulatory referral to Urology    Referral Priority:   Routine    Referral Type:   Consultation    Referral Reason:   Specialty Services Required    Referred to Provider:   Billey Co, MD    Requested Specialty:  Urology    Number of Visits  Requested:   1    All questions were answered. The patient knows to call the clinic with any problems questions or concerns.   Virgie Dad, FNP    Return of visit: to be determined.  Thank you for this kind referral and the opportunity to participate in the care of this patient. A copy of today's note is routed to referring provider    [name], MD, PhD Hematology Oncology [facility] Pager- [phone] 09/26/2020

## 2020-09-30 ENCOUNTER — Other Ambulatory Visit: Payer: Self-pay

## 2020-09-30 ENCOUNTER — Inpatient Hospital Stay (HOSPITAL_BASED_OUTPATIENT_CLINIC_OR_DEPARTMENT_OTHER): Payer: Medicare PPO | Admitting: Hospice and Palliative Medicine

## 2020-09-30 ENCOUNTER — Encounter: Payer: Self-pay | Admitting: Hospice and Palliative Medicine

## 2020-09-30 VITALS — BP 147/108 | HR 150 | Temp 97.8°F | Resp 20 | Wt 168.6 lb

## 2020-09-30 DIAGNOSIS — Z7901 Long term (current) use of anticoagulants: Secondary | ICD-10-CM

## 2020-09-30 DIAGNOSIS — Z515 Encounter for palliative care: Secondary | ICD-10-CM

## 2020-09-30 DIAGNOSIS — R627 Adult failure to thrive: Secondary | ICD-10-CM

## 2020-09-30 DIAGNOSIS — N2889 Other specified disorders of kidney and ureter: Secondary | ICD-10-CM | POA: Diagnosis not present

## 2020-09-30 DIAGNOSIS — F32A Depression, unspecified: Secondary | ICD-10-CM

## 2020-09-30 DIAGNOSIS — E119 Type 2 diabetes mellitus without complications: Secondary | ICD-10-CM

## 2020-09-30 DIAGNOSIS — R599 Enlarged lymph nodes, unspecified: Secondary | ICD-10-CM | POA: Diagnosis not present

## 2020-09-30 DIAGNOSIS — I4891 Unspecified atrial fibrillation: Secondary | ICD-10-CM | POA: Diagnosis not present

## 2020-09-30 NOTE — Progress Notes (Signed)
   Palliative Medicine [facility]  [phone] [phone]   Name: Barbara Frank Date: 09/30/2020 MRN: [id number]  DOB: 08/07/1946  Patient Care Team: Barbara Frank as PCP - General    REASON FOR CONSULTATION: Barbara Frank is a 74 y.o. female with multiple medical problems including A. fib on Eliquis, depression, and diabetes, who was admitted in October 2021 in [place] for failure to thrive.  Work-up including abdominal CT chest/abdomen/pelvis revealed a right renal mass and retroperitoneal adenopathy concerning for renal cell carcinoma.  Patient saw Barbara Frank and was not interested in pursuing aggressive diagnostic work-up or treatment.  She was referred to urology to establish care.  She was also referred to palliative care to discuss goals.  SOCIAL HISTORY:     reports that she has never smoked. She has never used smokeless tobacco. She reports previous alcohol use. She reports previous drug use.  Patient was previously living near [place] in a motel. Patient now lives in [facility].  She is widowed 3 years ago.  She has no children.  Her only living family is her husband's niece.  Patient is a retired high school special education teacher.  ADVANCE DIRECTIVES:  Patient's HC POA husband's niece, Barbara Frank  CODE STATUS:   PAST MEDICAL HISTORY: Past Medical History:  Diagnosis Date  . A-fib ([facility])   . Depression   . Diabetes mellitus without complication ([facility])   . History of kidney stones   . Kidney mass     PAST SURGICAL HISTORY:  Past Surgical History:  Procedure Laterality Date  . GASTRIC BYPASS    . KIDNEY STONE SURGERY      HEMATOLOGY/ONCOLOGY HISTORY:  Oncology History   No history exists.    ALLERGIES:  has No Known Allergies.  MEDICATIONS:  Current Outpatient Medications  Medication Sig Dispense Refill  . acetaminophen (TYLENOL) 650 MG CR tablet Take 650 mg by mouth every 4 (four)  hours as needed for pain or fever.    . cyanocobalamin (,VITAMIN B-12,) 1000 MCG/ML injection Inject into the muscle.    . ELIQUIS 5 MG TABS tablet Take 5 mg by mouth 2 (two) times daily.    . insulin glargine (LANTUS) 100 UNIT/ML injection Inject 25 Units into the skin 2 (two) times daily.    . metFORMIN (GLUCOPHAGE) 850 MG tablet Take 850 mg by mouth 2 (two) times daily with a meal.    . metoprolol succinate (TOPROL-XL) 50 MG 24 hr tablet Take 50 mg by mouth daily. Take with or immediately following a meal.     No current facility-administered medications for this visit.    VITAL SIGNS: There were no vitals taken for this visit. There were no vitals filed for this visit.  There is no height or weight on file to calculate BMI.  LABS: CBC:    Component Value Date/Time   WBC 7.5 09/26/2020 1243   HGB 13.6 09/26/2020 1243   HCT 41.2 09/26/2020 1243   PLT 230 09/26/2020 1243   MCV 81.9 09/26/2020 1243   NEUTROABS 4.8 09/26/2020 1243   LYMPHSABS 1.8 09/26/2020 1243   MONOABS 0.6 09/26/2020 1243   EOSABS 0.2 09/26/2020 1243   BASOSABS 0.0 09/26/2020 1243   Comprehensive Metabolic Panel:    Component Value Date/Time   NA 133 (Frank) 09/26/2020 1243   K 4.5 09/26/2020 1243   CL 99 09/26/2020 1243   CO2 27 09/26/2020 1243   BUN 16 09/26/2020 1243     CREATININE 0.70 09/26/2020 1243   GLUCOSE 426 (H) 09/26/2020 1243   CALCIUM 10.8 (H) 09/26/2020 1243   AST 17 09/26/2020 1243   ALT 13 09/26/2020 1243   ALKPHOS 98 09/26/2020 1243   BILITOT 0.6 09/26/2020 1243   PROT 6.8 09/26/2020 1243   ALBUMIN 3.6 09/26/2020 1243    RADIOGRAPHIC STUDIES: No results found.  PERFORMANCE STATUS (ECOG) : 2 - Symptomatic, <50% confined to bed  Review of Systems Unless otherwise noted, a complete review of systems is negative.  Physical Exam General: NAD, thin, frail-appearing Pulmonary: unlabored Extremities: no edema, no joint deformities Skin: no rashes Neurological: Weakness but otherwise  nonfocal  IMPRESSION: Met with patient today in the clinic.  I introduced palliative care services and attempted to establish therapeutic rapport.  Patient understands that she likely has kidney cancer.  Previously, she had verbalized disinterest in pursuing further work-up or treatment.  However, today patient seems anxious that she perceives there has been a prolonged delay in pursuing work-up.  She tells me that she would want biopsy or surgery and any other treatments that would be unlikely to negatively impact her quality of life.  She says that she has a strong [place] faith and is excepting of what ever may come.  However, she seems interested in prolonging her life if possible.  Patient now lives in an ALF.  She says that she has gained some new friends there.  However, she says that she has limited social support outside of ALF.  She does have a niece (she clarifies as her husband's family) who is involved as her [place] POA.  Patient says that this niece is incredibly busy and she is considered hiring someone to assume the role of financial and healthcare power of attorney.  Symptomatically, patient endorses constipation.  She says that she has medication that she takes for constipation at the ALF.  She says his bowel regimen is effective but she recently stopped taking it.  She plans to resume medication.  Patient denies any other distressing or concerning symptoms today.  At baseline, she ambulates with use of a walker.  She is able to engage in her own self-care.  We discussed options for transportation assistance if needed to medical appointments.  She has history of depression and reports previous hoarding behavior when she lived alone.  She feels she is coping well today and denies depression or anxiety.  She did verbalize some frustration with her medical care.  Will refer her to urology for evaluation.  PLAN: -Referral to urology (discussed with Dr. Diamantina Frank) -Referral to  community-based palliative care -RTC as needed  Case and plan discussed with Dr. Tasia Frank  Patient expressed understanding and was in agreement with this plan. She also understands that She can call the clinic at any time with any questions, concerns, or complaints.     Time Total: 35 minutes  Visit consisted of counseling and education dealing with the complex and emotionally intense issues of symptom management and palliative care in the setting of serious and potentially life-threatening illness.Greater than 50%  of this time was spent counseling and coordinating care related to the above assessment and plan.  Signed by: Altha Harm, PhD, NP-C

## 2020-10-01 ENCOUNTER — Telehealth: Payer: Self-pay | Admitting: Urology

## 2020-10-01 ENCOUNTER — Telehealth: Payer: Self-pay | Admitting: Adult Health Nurse Practitioner

## 2020-10-01 NOTE — Telephone Encounter (Signed)
Left message w/Julie, pt's niece, on DPR to call office to schedule appt tomorrow 10/02/20 at 12:00 per [facility]: Lambert Rhonda W

## 2020-10-01 NOTE — Telephone Encounter (Signed)
Called patient to offer to schedule a Palliative Consult, no answer and unable to leave a message due to no voicemail set up.  I then called niece, [name], and had to leave a message as well, requested a return call.

## 2020-10-01 NOTE — Telephone Encounter (Signed)
Pt calling stating that she CAN NOT come tomorrow, pt states she's going to a funeral and she lives at [place] and have to schedule transportation. Pt was very confused on why [facility] wanted to see her. I advised she was referred to our office, pt states she will deal with this later and disconnected the call.

## 2020-10-02 ENCOUNTER — Ambulatory Visit: Payer: Medicare PPO

## 2020-10-02 ENCOUNTER — Other Ambulatory Visit: Payer: Self-pay

## 2020-10-02 ENCOUNTER — Encounter: Payer: Self-pay | Admitting: Urology

## 2020-10-02 ENCOUNTER — Telehealth: Payer: Self-pay | Admitting: Adult Health Nurse Practitioner

## 2020-10-02 ENCOUNTER — Ambulatory Visit (INDEPENDENT_AMBULATORY_CARE_PROVIDER_SITE_OTHER): Payer: Medicare PPO | Admitting: Urology

## 2020-10-02 VITALS — BP 119/78 | HR 83 | Ht 62.0 in | Wt 168.0 lb

## 2020-10-02 DIAGNOSIS — N2889 Other specified disorders of kidney and ureter: Secondary | ICD-10-CM | POA: Diagnosis not present

## 2020-10-02 DIAGNOSIS — N200 Calculus of kidney: Secondary | ICD-10-CM

## 2020-10-02 NOTE — Telephone Encounter (Signed)
Spoke with Claiborne Billings (receptionist) @ [facility] and asked her to please get a message to the patient and ask her to give me a call back to schedule a visit with our Palliative NP.  Claiborne Billings agreed to do this and took my name and number.  If I do not hear back from patient today or tomorrow I will f/u with patient's niece again.

## 2020-10-02 NOTE — Patient Instructions (Signed)
Kidney Cancer  Kidney cancer is an abnormal growth of cells in one or both kidneys. The kidneys filter waste from your blood and produce urine. Kidney cancer may spread to other parts of your body. This type of cancer may also be called renal cell carcinoma. What are the causes? The cause of this condition is not always known. In some cases, abnormal changes to genes (genetic mutations) can cause cells to form cancer. What increases the risk? You may be more likely to develop kidney cancer if you:  Are over age 60. The risk increases with age.  Have a family history of kidney cancer.  Are of African-American, Native American, or Native [place] descent.  Smoke.  Are female.  Are obese.  Have high blood pressure (hypertension).  Have advanced kidney disease, especially if you need long-term dialysis.  Have certain conditions that are passed from parent to child (inherited), such as von Hippel-Lindau disease, tuberous sclerosis, or hereditary papillary renal carcinoma.  Have been exposed to certain chemicals. What are the signs or symptoms? In the early stages, kidney cancer does not cause symptoms. As the cancer grows, symptoms may include:  Blood in the urine.  Pain in the upper back or abdomen, just below the rib cage. You may feel pain on one or both sides of the body.  Fatigue.  Unexplained weight loss.  Fever. How is this diagnosed? This condition may be diagnosed based on:  Your symptoms and medical history.  A physical exam.  Blood and urine tests.  X-rays.  Imaging tests, such as CT scans, MRIs, and PET scans.  Having dye injected into your blood through an IV, and then having X-rays taken of: ? Your kidneys and the rest of the organs involved in making and storing urine (intravenous pyelogram). ? Your blood vessels (angiogram).  Removal and testing of a kidney tissue sample (biopsy). Your cancer will be assessed (staged), based on how severe it is and how  much it has spread. How is this treated? Treatment depends on the type and stage of the cancer. Treatment may include one or more of the following:  Surgery. This may include surgery to remove: ? Just the tumor (nephron-sparing surgery). ? The entire kidney (nephrectomy). ? The kidney, some of the surrounding healthy tissue, nearby lymph nodes, and the adrenal gland in certain cases (radical nephrectomy).  Medicines that kill cancer cells (chemotherapy).  High-energy rays that kill cancer cells (radiation therapy).  Targeted therapy. This targets specific parts of cancer cells and the area around them to block the growth and the spread of the cancer. Targeted therapy can help to limit the damage to healthy cells.  Medicines that help your body's disease-fighting system (immune system) fight cancer cells (immunotherapy).  Freezing cancer cells using gas or liquid that is delivered through a needle (cryoablation).  Destroying cancer cells using high-energy radio waves that are delivered through a needle-like probe (radiofrequency ablation).  A procedure to block the artery that supplies blood to the tumor, which kills the cancer cells (embolization). Follow these instructions at home: Eating and drinking  Some of your treatments might affect your appetite and your ability to chew and swallow. If you are having problems eating, or if you do not have an appetite, meet with a diet and nutrition specialist (dietitian).  If you have side effects that affect eating, it may help to: ? Eat smaller meals and snacks often. ? Drink high-nutrition and high-calorie shakes or supplements. ? Eat bland and soft   foods that are easy to eat. ? Not eat foods that are hot, spicy, or hard to swallow. Lifestyle  Do not drink alcohol.  Do not use any products that contain nicotine or tobacco, such as cigarettes and e-cigarettes. If you need help quitting, ask your health care provider. General  instructions  Take over-the-counter and prescription medicines only as told by your health care provider. This includes vitamins, supplements, and herbal products.  Consider joining a support group to help you cope with the stress of having kidney cancer.  Work with your health care provider to manage any side effects of treatment.  Keep all follow-up visits as told by your health care provider. This is important.   Where to find more information  American Cancer Society: https://www.cancer.[place] ([place]): https://www.cancer.gov Contact a health care provider if you:  Notice that you bruise or bleed easily.  Are losing weight without trying.  Have new or increased fatigue or weakness. Get help right away if you have:  Blood in your urine.  A sudden increase in pain.  A fever.  Shortness of breath.  Chest pain.  Yellow skin or whites of your eyes (jaundice). Summary  Kidney cancer is an abnormal growth of cells (tumor) in one or both kidneys. Tumors may spread to other parts of your body.  In the early stages, kidney cancer does not cause symptoms. As the cancer grows, symptoms may include blood in the urine, pain in the upper back or abdomen, unexplained weight loss, fatigue, and fever.  Treatment depends on the type and stage of the cancer. It may include surgery to remove the tumor, procedures and medicines to kill the cancer cells, or medicines to help your body fight cancer cells. This information is not intended to replace advice given to you by your health care provider. Make sure you discuss any questions you have with your health care provider. Document Revised: 09/14/2017 Document Reviewed: 09/11/2017 Elsevier Patient Education  2021 [employer].   Minimally Invasive Nephrectomy Minimally invasive nephrectomy is a surgical procedure to remove a kidney. This procedure is done through several small incisions in the abdomen. You may have  surgery to:  Remove the entire kidney and some surrounding structures (radical nephrectomy).  Remove only the damaged or diseased part of the kidney (partial nephrectomy). You may need this surgery if:  Your kidney is severely damaged from disease, infection, or cancer.  You were born with an abnormal kidney.  You are donating a healthy kidney. Tell a health care provider about:  Any allergies you have.  All medicines you are taking, including vitamins, herbs, eye drops, creams, and over-the-counter medicines.  Any problems you or family members have had with anesthetic medicines.  Any blood disorders you have.  Any surgeries you have had.  Any medical conditions you have.  Whether you are pregnant or may be pregnant. What are the risks? Generally, this is a safe procedure. However, problems may occur, including:  Bleeding.  Infection.  Damage to other body structures near the kidney.  Urine leaking into the abdomen.  Pneumonia.  A blood clot that forms in the leg and travels to the lung (pulmonary embolism).  Allergic reactions to medicines. A bone (usually part of a rib) or more tissue may need to be removed so the surgeon can get to the kidney. If this happens, the minimally invasive procedure may need to be changed to an open procedure. What happens before the procedure? Staying hydrated Follow  instructions from your health care provider about hydration, which may include:  Up to 2 hours before the procedure - you may continue to drink clear liquids, such as water, clear fruit juice, black coffee, and plain tea.   Eating and drinking restrictions Follow instructions from your health care provider about eating and drinking, which may include:  8 hours before the procedure - stop eating heavy meals or foods, such as meat, fried foods, or fatty foods.  6 hours before the procedure - stop eating light meals or foods, such as toast or cereal.  6 hours before the  procedure - stop drinking milk or drinks that contain milk.  2 hours before the procedure - stop drinking clear liquids. Medicines Ask your health care provider about:  Changing or stopping your regular medicines. This is especially important if you are taking diabetes medicines or blood thinners.  Taking medicines such as aspirin and ibuprofen. These medicines can thin your blood. Do not take these medicines unless your health care provider tells you to take them.  Taking over-the-counter medicines, vitamins, herbs, and supplements. Surgery safety Ask your health care provider:  How your surgery site will be marked.  What steps will be taken to help prevent infection. These steps may include: ? Removing hair at the surgery site. ? Washing skin with a germ-killing soap. ? Taking antibiotic medicine. General instructions  Do not use any products that contain nicotine or tobacco for at least 4 weeks before the procedure. These products include cigarettes, e-cigarettes, and chewing tobacco. If you need help quitting, ask your health care provider.  Your health care provider may instruct you to do breathing exercises before the procedure to help prevent pneumonia. Do these exercises as directed.  You may need to have your blood drawn (type and cross) in case you need to receive blood (get a transfusion) during the procedure.  Plan to have a responsible adult take you home from the hospital or clinic.  Plan to have a responsible adult care for you for the time you are told after you leave the hospital or clinic. This is important. What happens during the procedure? Before surgery begins  An IV will be inserted into one of your veins.  You will be given a medicine to make you fall asleep (general anesthetic).  Your health care provider may take steps to help blood flow in your legs. You may have: ? Tight stockings, also called compression stockings, on your legs. ? Compression  sleeves wrapped around your legs. A machine called a sequential compression device (SCD) will pump air into the sleeves.  A small, thin tube will be placed in your bladder (urinary catheter) to drain urine.  A tube will be placed through your nose and into your stomach (nasogastric tube) to drain stomach fluids. During surgery  A small incision will be made in your abdomen to insert a thin, lighted tube (laparoscope) with a camera attached. This lets your surgeon see your kidney during the procedure.  More small incisions will be made to insert other surgical tools. One incision may be slightly larger to remove your kidney. ? In some cases, the surgeon will do the procedure by controlling tools that are attached to a surgical robot. This is called a robot-assisted nephrectomy.  The next steps depend on the type of procedure you are having. ? If you are having a partial nephrectomy:  The blood vessels attached to your kidney will be clamped.  Part of your kidney  will be removed. The kidney will be closed with stitches (sutures), and the clamp on the blood vessels will be removed. ? If you are having a radical nephrectomy:  All of the blood vessels that attach to your kidney will be closed and cut.  Part of the tube that carries urine from your kidney to your bladder (ureter) will be removed.  Your kidney will be removed.  All of the surgical tools will be removed from your body.  A small tube (drain) may be placed near one of the incisions to drain extra fluid from the surgery area.  The incisions may be closed with sutures or another type of closure.  A bandage (dressing) will be placed over the incision area. The procedure may vary among health care providers and hospitals.      What happens after the procedure?  Your blood pressure, heart rate, breathing rate, and blood oxygen level will be monitored until you leave the hospital or clinic.  Your nasogastric tube will be  removed.  You may continue to have: ? An IV until you can drink fluids on your own. ? A urinary catheter. Your urine output will be checked. ? A drain. Your surgical drain will be removed after a few days. Your health care provider will tell you how to care for the drain at home. ? Pain medicine. This will be given through your IV. ? Compression stockings or compression sleeves.This helps to prevent blood clots and reduce swelling in your legs.  You will be encouraged to: ? Get out of bed and walk as soon as possible. ? Do breathing exercises, such as coughing and breathing deeply. This helps prevent pneumonia. Summary  Minimally invasive nephrectomy is a surgical procedure to remove a kidney. In some cases, only the damaged or diseased part of the kidney is removed.  This procedure is done through several small incisions in the abdomen.  Follow instructions from your health care provider about taking medicines and about eating and drinking before the procedure.  You will be given a medicine to make you fall asleep (general anesthetic) during the procedure. This information is not intended to replace advice given to you by your health care provider. Make sure you discuss any questions you have with your health care provider. Document Revised: 12/17/2019 Document Reviewed: 12/17/2019 Elsevier Patient Education  2021 [employer].

## 2020-10-02 NOTE — Progress Notes (Addendum)
10/02/20 12:16 PM   Kermit Balo Darrall Dears 24-Mar-1946 [id number]  CC: 5cm Right renal mass, history of left ureteral stone  HPI: I saw Ms. Rogerson in urology clinic for a 5 cm right renal mass.  She is a poor historian, and much of the history is obtained from chart review.  She is a 75 year old frail-appearing female who apparently was residing alone in [place] and was brought to the hospital with failure to thrive.  I reviewed those notes extensively in care everywhere.  Essentially she was found to have a left 4 mm distal ureteral stone with hydronephrosis and flank pain and a 5 cm central right renal mass worrisome for renal cell carcinoma with retroperitoneal lymphadenopathy measuring up to 4 cm concerning for metastatic disease.  No chest imaging has been performed.  The 4 mm left ureteral stone was removed with ureteroscopy and basket extraction in October 2021 at the outside hospital.  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  She does not remember much of what is happened over the last year or two, and it sounds like she likely had some severe depression contributing as well.  She is unsure if she has had any weight loss or weight gain over the last few months.  She denies any pain.  She denies any gross hematuria.  She was sent to live in [place] an assisted living facility, and her POA is [name] Cates(late husband's niece)-but it sounds like that relationship is not going well and she is trying to find a new POA.  She was seen by oncology and palliative care this week, and lab work notable for normal renal function with creatinine 0.7(EGFR greater than 60), hematocrit 41, WBC 7.5, LDH 92, calcium 10.8, urinalysis benign aside from 6-10 WBCs, elevated glucose of  426.  Her past medical history is notable for diabetes on insulin and atrial fibrillation on Eliquis.  Her past surgical history is notable for gastric bypass with follow-up ventral hernia repair and cholecystectomy.  PMH: Past Medical History:  Diagnosis Date  . A-fib ([place])   . Depression   . Diabetes mellitus without complication ([place])   . History of kidney stones   . Kidney mass     Surgical History: Past Surgical History:  Procedure Laterality Date  . GASTRIC BYPASS    . KIDNEY STONE SURGERY      Family History: Family History  Problem Relation Age of Onset  . Breast cancer Mother     Social History:  reports that she has never smoked. She has never used smokeless tobacco. She reports previous alcohol use. She reports previous drug use.  Physical Exam: BP 119/78   Pulse 83   Ht $R'5\' 2"'Uj$  (1.575 m)   Wt 168 lb (76.2 kg)   SpO2 98%   BMI 30.73 kg/m    Constitutional: Frail-appearing, uses walker Cardiovascular: No clubbing, cyanosis, or edema. Respiratory: Normal respiratory effort, no increased work of breathing. GI: Abdomen is soft, nontender, nondistended, no abdominal masses GU: No CVA tenderness  Laboratory Data: Reviewed, see HPI  Pertinent Imaging: I have personally viewed and interpreted the CT abdomen from 06/09/2020 that shows the 5 cm right enhancing central renal mass and 4 cm of retroperitoneal lymphadenopathy worrisome for metastatic disease, there  is persistent hydronephrosis on the left side down to the presumed 4 mm left distal ureteral stone that was subsequently removed by urology with ureteroscopy.  No more recent imaging to review.  Assessment & Plan:   In summary, she is a 75 year old female who was originally found in October 2021 at an outside hospital to have a 5 cm enhancing right renal mass worrisome for renal cell carcinoma with 4 cm of retroperitoneal lymphadenopathy worrisome for metastatic disease.  At that time she also had a 4 mm left  distal ureteral stone that has subsequently been removed by urology with ureteroscopy in early October.  Renal function is normal, she has poorly controlled diabetes and atrial fibrillation on Eliquis.  She does not have any pain or symptoms at this time.  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  We reviewed the role of renal mass biopsy at length.  We also discussed the controversies surrounding treatment of metastatic RCC and the debate around cytoreductive nephrectomy.  We discussed options including renal mass biopsy, systemic immunotherapy and consideration of future nephrectomy in the future pending response to treatment, or upfront cytoreductive nephrectomy and consideration of systemic treatment in the future.  We reviewed hand-assisted laparoscopic radical nephrectomy which would take 2 to 3 hours, require 2-3 night hospital stay, have a small risk of bleeding or infection, and would anticipate 4 to 6 weeks before she is back to her baseline functional status.  She may require some rehab postoperatively.  Advantages would be tissue diagnosis and debulking of the majority of her disease, but this would likely not be curative and would require additional systemic treatment.    She does want to pursue treatment.  I recommended starting with updated imaging with a CT of the chest abdomen and pelvis to evaluate for any progression of disease or metastatic disease to the chest that would potentially change management.  She explicitly asked me not to contact her [place], and she wanted to discuss with her first.  I recommended that at our follow-up visit to review her updated imaging, she bring [place] with her so she has someone else to help make these challenging decisions.  -Complete staging imaging with CT chest, abdomen, pelvis, scheduled for tomorrow -Will plan to add to tumor board for discussion of options with her complex case -Follow-up in 1 to 2 weeks to review  imaging and re-discuss laparoscopic cytoreductive nephrectomy, ideally bring POA [name] with her if possible and further discuss treatment strategies  Addendum: New imaging with CT C/A/P reviewed, notable for 2cm right upper lobe lesion suspicious for metastatic disease, known large right renal mass with RP lymphadenopathy, nodules in the duodenum and stomach that may represent a second primary neoplasm, and nodularity throughout the peritoneum suspicious for peritoneal carcinomatosis.  With her extensive suspected metastatic disease and additional GI neoplasm, as well as her overall frailty, she is not a candidate for cytoreductive nephrectomy at this time. I communicated these findings to Dr. Tasia Catchings via [facility]. Will discuss further in person with patient at our follow up visit. Would recommend palliative approach or systemic therapy per patient preference.  I spent 70 total minutes on the day of the encounter including pre-visit review of the medical record, face-to-face time with the patient, and post visit ordering of labs/imaging/tests.  Nickolas Madrid, MD 10/02/2020  [facility] [address], [place], [place] [address] [phone]

## 2020-10-03 ENCOUNTER — Ambulatory Visit
Admission: RE | Admit: 2020-10-03 | Discharge: 2020-10-03 | Disposition: A | Discharge status: Home / Self Care | Payer: Medicare PPO | Source: Ambulatory Visit | Attending: Urology | Admitting: Urology

## 2020-10-03 DIAGNOSIS — R590 Localized enlarged lymph nodes: Secondary | ICD-10-CM | POA: Insufficient documentation

## 2020-10-03 DIAGNOSIS — R918 Other nonspecific abnormal finding of lung field: Secondary | ICD-10-CM | POA: Diagnosis not present

## 2020-10-03 DIAGNOSIS — I700 Atherosclerosis of aorta: Secondary | ICD-10-CM | POA: Diagnosis not present

## 2020-10-03 DIAGNOSIS — I2510 Atherosclerotic heart disease of native coronary artery without angina pectoris: Secondary | ICD-10-CM | POA: Insufficient documentation

## 2020-10-03 DIAGNOSIS — N2889 Other specified disorders of kidney and ureter: Secondary | ICD-10-CM | POA: Insufficient documentation

## 2020-10-03 DIAGNOSIS — I2721 Secondary pulmonary arterial hypertension: Secondary | ICD-10-CM | POA: Diagnosis not present

## 2020-10-03 MED ORDER — IOHEXOL 300 MG/ML  SOLN
100.0000 mL | Freq: Once | INTRAMUSCULAR | Status: AC | PRN
  Administered 2020-10-03: 100 mL via INTRAVENOUS

## 2020-10-06 ENCOUNTER — Telehealth: Payer: Self-pay | Admitting: Adult Health Nurse Practitioner

## 2020-10-06 NOTE — Telephone Encounter (Signed)
Called patient again to offer to schedule Consult with Palliative NP for tomorrow, 10/07/20 @ 9 AM (NP had a cancellation), no answer and no VM.  I caled niece, Barbara Frank back and left a message to let her know that Barbara Frank has not returned my call and that I did call and leave a message at [facility].  I let the niece know that NP had a cancellation and that I was trying to get patient scheduled for tomorrow @ 9 AM, left my contact information requesting a return call.

## 2020-10-14 ENCOUNTER — Telehealth: Payer: Self-pay

## 2020-10-14 NOTE — Telephone Encounter (Signed)
Please schedule as MD recommends and inform patient.

## 2020-10-14 NOTE — Telephone Encounter (Signed)
-----   Message from [name], MD sent at 10/14/2020  8:56 AM EST ----- Regarding: follow up Team, please schedule her to have a follow up visit with both me and Vonna Kotyk to go over CT results and treatment plan.   Talbert Cage

## 2020-10-14 NOTE — Telephone Encounter (Signed)
FYI....  Spoke with pts (Niece) [name]  she stated that she would try To contact the pt to have her call the office back to have her appts scheduled as resquested

## 2020-10-14 NOTE — Telephone Encounter (Signed)
Pt left vm stating her phone is now working and requested a call back at [phone].  Routing to scheduler for follow up.

## 2020-10-15 NOTE — Telephone Encounter (Signed)
FYI... I'm still trying to reach pt. to get her sched as requested. I called [facility] and was told that they would relay a message to Her to give the office a call back asap

## 2020-10-17 ENCOUNTER — Inpatient Hospital Stay: Payer: Medicare PPO | Admitting: Hospice and Palliative Medicine

## 2020-10-17 ENCOUNTER — Inpatient Hospital Stay: Payer: Medicare PPO | Attending: Oncology | Admitting: Oncology

## 2020-10-17 ENCOUNTER — Encounter: Payer: Self-pay | Admitting: Oncology

## 2020-10-17 VITALS — BP 134/92 | HR 108 | Temp 96.6°F | Resp 18 | Wt 173.4 lb

## 2020-10-17 DIAGNOSIS — R222 Localized swelling, mass and lump, trunk: Secondary | ICD-10-CM

## 2020-10-17 DIAGNOSIS — Z7901 Long term (current) use of anticoagulants: Secondary | ICD-10-CM | POA: Insufficient documentation

## 2020-10-17 DIAGNOSIS — C786 Secondary malignant neoplasm of retroperitoneum and peritoneum: Secondary | ICD-10-CM

## 2020-10-17 DIAGNOSIS — F32A Depression, unspecified: Secondary | ICD-10-CM | POA: Insufficient documentation

## 2020-10-17 DIAGNOSIS — I4891 Unspecified atrial fibrillation: Secondary | ICD-10-CM | POA: Insufficient documentation

## 2020-10-17 DIAGNOSIS — N2889 Other specified disorders of kidney and ureter: Secondary | ICD-10-CM | POA: Diagnosis present

## 2020-10-17 DIAGNOSIS — Z7189 Other specified counseling: Secondary | ICD-10-CM

## 2020-10-17 NOTE — Progress Notes (Signed)
Patient has chronic constipation that was worse for the past 2 weeks but finally had a bowel movement yesterday.

## 2020-10-17 NOTE — Progress Notes (Signed)
Hematology/Oncology Consult note [facility] [phone] [phone]   Patient Care Team: Sheral Apley as PCP - General  REFERRING PROVIDER: No ref. provider found  CHIEF COMPLAINTS/REASON FOR VISIT:  Evaluation of right kidney mass  HISTORY OF PRESENTING ILLNESS:   Barbara Frank is a  75 y.o.  female with PMH listed below was seen in consultation at the request of  No ref. provider found  for evaluation of right kidney mass.   Patient is a poor historian. Her mood appears to be depressed.  She currently lives at assisted living facility. Moved from [place]. Widowed 3 years ago.  Her power of attorney is [name] who is her husband's niece. [name] was called during this encounter.   Extensive medical record review was performed. She has had multiple images done during the past few months, 05/27/20 CT head wo contrast- no acute intracranial abnormality.  06/01/20 CT abdomen and pelvis with contrast 4 mm proximal left ureteral calculus causing mild proximal left- sided hydroureteronephrosis with periureteric and perinephric stranding on the left.5.2cm enhancing right mid anterior kidney mass. Mildly enlarged right retroperitoneal retrocaval lymph nodes suspicious for nodal metastases Mild cardiomegaly, Small hiatal hernia. Status post gastric bypass surgery and cholecystectomy. Postoperative changes anterior abdominal wall. 06/05/20 CT head wo contrast - No acute intracranial abnormality. Probable tiny right frontal scalp Contusion. 2. Mild global volume loss and minimal chronic ischemic small vessel disease. 06/06/20 CT abdomen pelvis wo contrast 1. Indeterminate lesion in the right kidney which is stable may be a solid mass., 2. Partially calcified uterine fibroids. 3. Left UVJ calculus measuring 5 mm 06/07/20 CT head wo contrast.  1. No acute intracranial abnormality. No significant change. 2. Intracranial atherosclerosis, atrophy and  chronic small vessel ischemic changes. 3. Chronic/subacute right maxillary sinus disease.  06/07/20 Cystoscopy by [name] for Ureteroscopic stone extraction Admitted on 10/3/21due to failure to thrive,  anorexia, dehydration, electrolyte imbalances.   06/09/20 CT abdomen and chest with contrast 1. Right renal mass is stable in size. Most suggestive of a renal neoplasm. 2. Right retroperitoneal lymph nodes are stable in size; may well be metastatic. 3. Left hydronephrosis and hydroureter is stable. Pelvis is not imaged therefore if there is a distal calculus still present, it is not visible. 4. Sclerotic lesions in the anterior left seventh, eighth and ninth ribs which given distribution are most likely healed rib fractures.  She is on Eliquis 5mg  Bid for atrial fibrillation. She reports not taking anti depressants that were given by her PCP.  INTERVAL HISTORY Barbara Frank is a 75 y.o. female who has above history reviewed by me today presents for follow up visit for management of renal mass. Problems and complaints are listed below: Patient was seen by urology and a CT chest abdomen pelvis was obtained for staging. Patient presents to discuss imaging results and management plan. Reports having constipation which she took laxatives which improved.  Vaginal itchiness.  Review of Systems  Constitutional: Positive for fatigue. Negative for appetite change, chills and fever.  HENT:   Negative for hearing loss and voice change.   Eyes: Negative for eye problems.  Respiratory: Negative for chest tightness and cough.   Cardiovascular: Negative for chest pain.  Gastrointestinal: Positive for constipation. Negative for abdominal distention, abdominal pain and blood in stool.  Endocrine: Negative for hot flashes.  Genitourinary: Negative for difficulty urinating and frequency.   Musculoskeletal: Negative for arthralgias.  Skin: Negative for itching and rash.  Neurological: Negative for  extremity weakness.  Hematological: Negative for adenopathy.  Psychiatric/Behavioral: Positive for depression. Negative for confusion.    MEDICAL HISTORY:  Past Medical History:  Diagnosis Date  . A-fib ([place])   . Depression   . Diabetes mellitus without complication ([place])   . History of kidney stones   . Kidney mass     SURGICAL HISTORY: Past Surgical History:  Procedure Laterality Date  . GASTRIC BYPASS    . HERNIA REPAIR    . TONSILLECTOMY    . URETEROSCOPY WITH HOLMIUM LASER LITHOTRIPSY Left     SOCIAL HISTORY: Social History   Socioeconomic History  . Marital status: Unknown    Spouse name: Not on file  . Number of children: Not on file  . Years of education: Not on file  . Highest education level: Not on file  Occupational History  . Not on file  Tobacco Use  . Smoking status: Never Smoker  . Smokeless tobacco: Never Used  Vaping Use  . Vaping Use: Never used  Substance and Sexual Activity  . Alcohol use: Not Currently  . Drug use: Not Currently  . Sexual activity: Not Currently  Other Topics Concern  . Not on file  Social History Narrative  . Not on file   Social Determinants of Health   Financial Resource Strain: Not on file  Food Insecurity: Not on file  Transportation Needs: Not on file  Physical Activity: Not on file  Stress: Not on file  Social Connections: Not on file  Intimate Partner Violence: Not on file    FAMILY HISTORY: Family History  Problem Relation Age of Onset  . Breast cancer Mother   . Bladder Cancer Neg Hx   . Kidney cancer Neg Hx   . Prostate cancer Neg Hx     ALLERGIES:  has No Known Allergies.  MEDICATIONS:  Current Outpatient Medications  Medication Sig Dispense Refill  . acetaminophen (TYLENOL) 650 MG CR tablet Take 650 mg by mouth every 4 (four) hours as needed for pain or fever.    . cyanocobalamin (,VITAMIN B-12,) 1000 MCG/ML injection Inject into the muscle.    Marland Kitchen ELIQUIS 5 MG TABS tablet Take 5 mg by mouth  2 (two) times daily.    . insulin glargine (LANTUS) 100 UNIT/ML injection Inject 25 Units into the skin 2 (two) times daily.    . metFORMIN (GLUCOPHAGE) 850 MG tablet Take 850 mg by mouth 2 (two) times daily with a meal.    . metoprolol succinate (TOPROL-XL) 50 MG 24 hr tablet Take 50 mg by mouth daily. Take with or immediately following a meal.    . Psyllium 48.57 % POWD Take by mouth. Takes 1 QD prn     No current facility-administered medications for this visit.     PHYSICAL EXAMINATION: ECOG PERFORMANCE STATUS: 2 - Symptomatic, <50% confined to bed Vitals:   10/17/20 1141  BP: (!) 134/92  Pulse: (!) 108  Resp: 18  Temp: (!) 96.6 F (35.9 C)   Filed Weights   10/17/20 1141  Weight: 173 lb 6.4 oz (78.7 kg)    Physical Exam Constitutional:      General: She is not in acute distress.    Comments: Frail elderly female walks with a walker.   HENT:     Head: Normocephalic and atraumatic.  Eyes:     General: No scleral icterus. Cardiovascular:     Rate and Rhythm: Normal rate. Rhythm irregular.     Heart sounds: Normal heart sounds.  Pulmonary:     Effort: Pulmonary effort is normal. No respiratory distress.     Breath sounds: No wheezing.  Abdominal:     General: Bowel sounds are normal. There is no distension.     Palpations: Abdomen is soft.  Musculoskeletal:        General: No deformity. Normal range of motion.     Cervical back: Normal range of motion and neck supple.  Skin:    General: Skin is warm and dry.     Findings: No erythema or rash.  Neurological:     Mental Status: She is alert. Mental status is at baseline.     Cranial Nerves: No cranial nerve deficit.     Coordination: Coordination normal.     LABORATORY DATA:  I have reviewed the data as listed Lab Results  Component Value Date   WBC 7.5 09/26/2020   HGB 13.6 09/26/2020   HCT 41.2 09/26/2020   MCV 81.9 09/26/2020   PLT 230 09/26/2020   Recent Labs    06/20/20 2005 09/26/20 1243  NA  134* 133*  K 4.6 4.5  CL 98 99  CO2 26 27  GLUCOSE 303* 426*  BUN 12 16  CREATININE 0.58 0.70  CALCIUM 10.2 10.8*  GFRNONAA >60 >60  PROT  --  6.8  ALBUMIN  --  3.6  AST  --  17  ALT  --  13  ALKPHOS  --  98  BILITOT  --  0.6   Iron/TIBC/Ferritin/ %Sat No results found for: IRON, TIBC, FERRITIN, IRONPCTSAT    RADIOGRAPHIC STUDIES: I have personally reviewed the radiological images as listed and agreed with the findings in the report. CT Abdomen Pelvis W Wo Contrast  Result Date: 10/03/2020 CLINICAL DATA:  Staging of urologic cancer, renal mass in this 75 year old female found to have a large RIGHT renal mass. EXAM: CT ABDOMEN AND PELVIS WITHOUT AND CHEST, ABDOMEN, AND PELVIS WITH CONTRAST TECHNIQUE: Multidetector CT imaging of the abdomen and pelvis was performed without contrast. Subsequently, CT imaging of the chest, abdomen and pelvis was performed following the standard protocol following bolus administration of intravenous contrast. CONTRAST:  158mL OMNIPAQUE IOHEXOL 300 MG/ML  SOLN COMPARISON:  06/09/2020 FINDINGS: CT CHEST FINDINGS Cardiovascular: Heart size is enlarged particularly LEFT atrium and RIGHT heart. No pericardial effusion. Signs of coronary artery disease of LEFT and RIGHT coronary circulation. Calcified atheromatous plaque, noncalcified plaque mild in the thoracic aorta which is nonaneurysmal. Central pulmonary vasculature is engorged markedly so at 4.2 cm greatest axial dimension. Mediastinum/Nodes: No thoracic inlet adenopathy. No axillary lymphadenopathy. No mediastinal lymphadenopathy. No hilar lymphadenopathy. Esophagus grossly normal. Lungs/Pleura: Spiculated nodule in the RIGHT upper lobe just above the minor fissure measuring 2.0 x 1.5 x 1.7 cm. Tiny pulmonary nodules on image 37 of series 11 measuring 4 and 2 mm in the medial and lateral posterior RIGHT lower lobe respectively. Airways are patent. Ill-defined nodule in the LEFT lung base measuring 3 mm on image  40. Musculoskeletal: See below for full musculoskeletal details. Spinal degenerative changes. CT ABDOMEN PELVIS FINDINGS Hepatobiliary: Subtle area of low attenuation along the anterior LEFT hepatic lobe (image 70, series 7) 10 mm. Post cholecystectomy with mild extrahepatic biliary duct distension. Portal vein is patent. Hepatic veins are patent. Pancreas: Atrophy of the pancreas without focal lesion or sign of inflammation. Spleen: Normal Adrenals/Urinary Tract: Adrenal glands are normal. RIGHT kidney: Arising from the anterior hilar lip and abutting renal vasculature is a reasonably well marginated bulge shaped heterogeneously  enhancing RIGHT renal mass that measures 4.8 x 4.3 x 5.7 cm. The RIGHT renal vein is patent. The mass is centered on the anterior hilar lip displacing hilar vessels. No hydronephrosis. No LEFT-sided renal mass. Hyperenhancing RIGHT hilar lymph nodes 2.1 cm lymph node on image 91 of series 7 behind the inferior vena cava. The RIGHT retrocaval lymph node also with similar enhancement characteristics on image 95 of series 7 measuring 11 mm. Stomach/Bowel: Post gastric bypass with changes of gastrojejunostomy. Some gastric thickening is noted diffusely with some wall enhancement. Nodule in the duodenal bulb with hyperenhancement measuring 2 x 2.2 cm (image 91 of series 7. Other nodular areas of change along the mucosal surface in the distal stomach/pylorus are noted on image 26 and 27 through image 33 of series 8. No acute gastrointestinal process. Nodularity in the area of the appendix but without visualization of the appendix along fascial planes in the RIGHT lower quadrant measuring 3.2 x 1.7 cm in greatest axial dimension on image 86 of series 12. Similar appearing nodule with calcification in the LEFT hemiabdomen measuring 1.7 cm on image 69 of series 12. Postoperative changes in the midline of the abdomen. Vascular/Lymphatic: Patent abdominal vasculature with signs of minimal  atherosclerotic change. No aneurysmal dilation. Retroperitoneal adenopathy as described above. Reproductive: No pelvic lymphadenopathy. Posterior to the uterus is a small area of nodularity with some calcification similar to other peritoneal lesions described above measuring 2.5 x 1.4 cm with another peripherally calcified area in the cul-de-sac at the upper margin of the mesorectum measuring 1.5 cm (image 114, series 12) Other: An additional calcified peritoneal nodule on image 84 series 12 measures 11 mm. An additional nodule on image 51 of series 12 measuring 14 mm both of these in the LEFT hemiabdomen. Additional small calcified nodule seen on image 68 of series 12 in the anterior peritoneum just to the LEFT of midline. Musculoskeletal: Spinal degenerative changes. No acute or destructive bone process IMPRESSION: 1. Spiculated nodule in the RIGHT upper lobe just above the minor fissure, measuring 2.0 x 1.5 x 1.7 cm, suspicious for lung primary versus metastatic disease in the chest. PET scan may be helpful. 2. Other small pulmonary nodules are less than 5 mm, attention on follow-up. 3. Large RIGHT renal mass centered about the anterior hilar lip, associated with retroperitoneal adenopathy. 4. Nodules in the duodenum and distal stomach are nonspecific, potentially primary neoplasm of the gastrointestinal tract though metastatic disease while unusual is also considered. 5. Calcified areas of nodularity throughout the peritoneum suspicious for peritoneal carcinomatosis. Appendix not discretely seen and dominant area in the expected location of the appendix. Correlate with prior appendectomy and with pathology if available. Implants are present in the cul-de-sac as well. Correlation with more remote imaging could be helpful to determine whether this could somehow be related to postoperative/inflammatory changes though findings remain suspicious for peritoneal disease. 6. Subtle area of low attenuation along the  anterior LEFT hepatic lobe, nonspecific, attention on follow-up. MRI may be helpful for further evaluation. 7. Signs of coronary artery disease and marked pulmonary arterial hypertension. 8. Aortic atherosclerosis. Aortic Atherosclerosis (ICD10-I70.0). Electronically Signed   By: Zetta Bills M.D.   On: 10/03/2020 11:13   CT CHEST W CONTRAST  Result Date: 10/03/2020 CLINICAL DATA:  Staging of urologic cancer, renal mass in this 75 year old female found to have a large RIGHT renal mass. EXAM: CT ABDOMEN AND PELVIS WITHOUT AND CHEST, ABDOMEN, AND PELVIS WITH CONTRAST TECHNIQUE: Multidetector CT imaging of the abdomen  and pelvis was performed without contrast. Subsequently, CT imaging of the chest, abdomen and pelvis was performed following the standard protocol following bolus administration of intravenous contrast. CONTRAST:  173mL OMNIPAQUE IOHEXOL 300 MG/ML  SOLN COMPARISON:  06/09/2020 FINDINGS: CT CHEST FINDINGS Cardiovascular: Heart size is enlarged particularly LEFT atrium and RIGHT heart. No pericardial effusion. Signs of coronary artery disease of LEFT and RIGHT coronary circulation. Calcified atheromatous plaque, noncalcified plaque mild in the thoracic aorta which is nonaneurysmal. Central pulmonary vasculature is engorged markedly so at 4.2 cm greatest axial dimension. Mediastinum/Nodes: No thoracic inlet adenopathy. No axillary lymphadenopathy. No mediastinal lymphadenopathy. No hilar lymphadenopathy. Esophagus grossly normal. Lungs/Pleura: Spiculated nodule in the RIGHT upper lobe just above the minor fissure measuring 2.0 x 1.5 x 1.7 cm. Tiny pulmonary nodules on image 37 of series 11 measuring 4 and 2 mm in the medial and lateral posterior RIGHT lower lobe respectively. Airways are patent. Ill-defined nodule in the LEFT lung base measuring 3 mm on image 40. Musculoskeletal: See below for full musculoskeletal details. Spinal degenerative changes. CT ABDOMEN PELVIS FINDINGS Hepatobiliary: Subtle  area of low attenuation along the anterior LEFT hepatic lobe (image 70, series 7) 10 mm. Post cholecystectomy with mild extrahepatic biliary duct distension. Portal vein is patent. Hepatic veins are patent. Pancreas: Atrophy of the pancreas without focal lesion or sign of inflammation. Spleen: Normal Adrenals/Urinary Tract: Adrenal glands are normal. RIGHT kidney: Arising from the anterior hilar lip and abutting renal vasculature is a reasonably well marginated bulge shaped heterogeneously enhancing RIGHT renal mass that measures 4.8 x 4.3 x 5.7 cm. The RIGHT renal vein is patent. The mass is centered on the anterior hilar lip displacing hilar vessels. No hydronephrosis. No LEFT-sided renal mass. Hyperenhancing RIGHT hilar lymph nodes 2.1 cm lymph node on image 91 of series 7 behind the inferior vena cava. The RIGHT retrocaval lymph node also with similar enhancement characteristics on image 95 of series 7 measuring 11 mm. Stomach/Bowel: Post gastric bypass with changes of gastrojejunostomy. Some gastric thickening is noted diffusely with some wall enhancement. Nodule in the duodenal bulb with hyperenhancement measuring 2 x 2.2 cm (image 91 of series 7. Other nodular areas of change along the mucosal surface in the distal stomach/pylorus are noted on image 26 and 27 through image 33 of series 8. No acute gastrointestinal process. Nodularity in the area of the appendix but without visualization of the appendix along fascial planes in the RIGHT lower quadrant measuring 3.2 x 1.7 cm in greatest axial dimension on image 86 of series 12. Similar appearing nodule with calcification in the LEFT hemiabdomen measuring 1.7 cm on image 69 of series 12. Postoperative changes in the midline of the abdomen. Vascular/Lymphatic: Patent abdominal vasculature with signs of minimal atherosclerotic change. No aneurysmal dilation. Retroperitoneal adenopathy as described above. Reproductive: No pelvic lymphadenopathy. Posterior to the  uterus is a small area of nodularity with some calcification similar to other peritoneal lesions described above measuring 2.5 x 1.4 cm with another peripherally calcified area in the cul-de-sac at the upper margin of the mesorectum measuring 1.5 cm (image 114, series 12) Other: An additional calcified peritoneal nodule on image 84 series 12 measures 11 mm. An additional nodule on image 51 of series 12 measuring 14 mm both of these in the LEFT hemiabdomen. Additional small calcified nodule seen on image 68 of series 12 in the anterior peritoneum just to the LEFT of midline. Musculoskeletal: Spinal degenerative changes. No acute or destructive bone process IMPRESSION: 1. Spiculated nodule  in the RIGHT upper lobe just above the minor fissure, measuring 2.0 x 1.5 x 1.7 cm, suspicious for lung primary versus metastatic disease in the chest. PET scan may be helpful. 2. Other small pulmonary nodules are less than 5 mm, attention on follow-up. 3. Large RIGHT renal mass centered about the anterior hilar lip, associated with retroperitoneal adenopathy. 4. Nodules in the duodenum and distal stomach are nonspecific, potentially primary neoplasm of the gastrointestinal tract though metastatic disease while unusual is also considered. 5. Calcified areas of nodularity throughout the peritoneum suspicious for peritoneal carcinomatosis. Appendix not discretely seen and dominant area in the expected location of the appendix. Correlate with prior appendectomy and with pathology if available. Implants are present in the cul-de-sac as well. Correlation with more remote imaging could be helpful to determine whether this could somehow be related to postoperative/inflammatory changes though findings remain suspicious for peritoneal disease. 6. Subtle area of low attenuation along the anterior LEFT hepatic lobe, nonspecific, attention on follow-up. MRI may be helpful for further evaluation. 7. Signs of coronary artery disease and marked  pulmonary arterial hypertension. 8. Aortic atherosclerosis. Aortic Atherosclerosis (ICD10-I70.0). Electronically Signed   By: Zetta Bills M.D.   On: 10/03/2020 11:13      ASSESSMENT & PLAN:  1. Right kidney mass   2. 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 Hypercalcemia    #Right kidney mass with retroperitoneal lymphadenopathy Chest mass, peritoneal carcinomatosis 10/03/2020, CT chest abdomen pelvis with contrast images were reviewed and discussed with patient.  Patient feels there is no need to call her needs right now as she is working.  She will further discuss her information with her.  Spiculated nodule in the right upper lobe just above the minor fissure, 2 x 1.5 x 1.7 cm suspicious for primary lung cancer versus metastatic disease.  Small pulmonary nodules less than 5 mm attention on follow-up. Large right renal mass associated with a retroperitoneal adenopathy. Nodules in the duodenum and distal stomach are nonspecific. Calcified area of nodularity throughout the peritoneum suspicious for peritoneal carcinomatosis.  Appendix was not discretely seen and dominant area in the expected location of the appendix.  Subtle area of low attenuation along the anterior left hepatic lobe.  Nonspecific.  Coronary artery disease and marked pulmonary arterial hypertension.  Aortic atherosclerosis  I recommend patient to obtain PET scan and determine site for tissue diagnosis. Consider metastatic RCC +/- lung mets versus primary lung neoplasm.  Patient agrees with setting up a PET scan. Goals of care were discussed with patient.  She will decide after the PET scan regarding biopsy and whether to proceed with aggressive work-up/treatments versus hospice.  A Fib on Eliquis 5mg  BID.  Depression/greiving. Currently not on any medication as she does not want to take.  Follow-up with palliative care service.  #Hypercalcemia, calcium is 10.8, likely secondary to underlying  malignancy.  Orders Placed This Encounter  Procedures  . NM PET Image Initial (PI) Skull Base To Thigh    Standing Status:   Future    Standing Expiration Date:   10/17/2021    Order Specific Question:   If indicated for the ordered procedure, I authorize the administration of a radiopharmaceutical per Radiology protocol    Answer:   Yes    Order Specific Question:   Preferred imaging location?    Answer:   [facility]    Order Specific Question:   Radiology Contrast Protocol - do NOT remove file path  Answer:   [email or web address]    All questions were answered. The patient knows to call the clinic with any problems questions or concerns.  Return of visit: to be determined.  Thank you for this kind referral and the opportunity to participate in the care of this patient. A copy of today's note is routed to referring provider    [name], MD, PhD Hematology Oncology [facility] at [facility] Pager- [phone] 10/17/2020

## 2020-10-20 ENCOUNTER — Telehealth: Payer: Self-pay | Admitting: *Deleted

## 2020-10-20 NOTE — Telephone Encounter (Signed)
Cx 10/22/20 PET scan due to pending pre auth per [facility] PET scan was cx as requested. Pt was last seen on 10/17/20 I made her aware that if the sched PET was not approved by Ins. then It would be cx. Pt stated that she was working on getting a new phone and that she would call the office on 10/20/20 the see if it was still sched. No incoming call was received by her so I called  [name] pts (Niece) and left a very detailed message on her VM making her aware of the cx PET scan.

## 2020-10-21 NOTE — Telephone Encounter (Signed)
Pts.  PET was approved per [facility] on 10/21/20 I called and had pts PET scan R/S.Per [facility] Scheduled the 1st avail appt is 11/04/20. I called pts niece and left a very detailed message on her VM making her aware the the PET had been approved. And to contact the office with any questions. A NEW appt letter was mailed to the address list on pts chart. FYI (Pt did state on 10/17/20 that she didn't have a phone) and to contact her niece [name] went appt details.

## 2020-10-22 ENCOUNTER — Ambulatory Visit: Payer: Medicare PPO

## 2020-10-27 ENCOUNTER — Telehealth: Payer: Self-pay | Admitting: *Deleted

## 2020-10-27 NOTE — Telephone Encounter (Signed)
FYI..... I contacted [facility] and spoke with Judye Bos the manager and made him aware of her sched 11/04/20 PET scan.  He stated that they will provide transportation for her

## 2020-10-28 ENCOUNTER — Telehealth: Payer: Self-pay | Admitting: Adult Health Nurse Practitioner

## 2020-10-28 NOTE — Telephone Encounter (Signed)
Called patient to schedule Palliative Consult, no answer and no VM.    I also called patient's niece, [name], and left her a message letting her know that I still have not rec'd a return call from patient and would like for her to call me back.

## 2020-10-29 ENCOUNTER — Ambulatory Visit: Payer: Medicare PPO | Admitting: Urology

## 2020-10-31 ENCOUNTER — Telehealth: Payer: Self-pay | Admitting: Adult Health Nurse Practitioner

## 2020-10-31 NOTE — Telephone Encounter (Signed)
Spoke with patient and told her that I was calling from [place] and why I was calling and she said she wasn't in a good place to listen to me and hung the phone up.  I then called patient's niece [name], and left her a message regarding my conversation with the patient and requested a return call to let me know if I needed to cancel this referral or if she could speak with the patient, left my name and call back number.

## 2020-11-04 ENCOUNTER — Ambulatory Visit
Admission: RE | Admit: 2020-11-04 | Discharge: 2020-11-04 | Disposition: A | Discharge status: Home / Self Care | Payer: Medicare PPO | Source: Ambulatory Visit | Attending: Oncology | Admitting: Oncology

## 2020-11-04 ENCOUNTER — Other Ambulatory Visit: Payer: Self-pay

## 2020-11-04 DIAGNOSIS — N2889 Other specified disorders of kidney and ureter: Secondary | ICD-10-CM | POA: Insufficient documentation

## 2020-11-04 DIAGNOSIS — J849 Interstitial pulmonary disease, unspecified: Secondary | ICD-10-CM | POA: Diagnosis not present

## 2020-11-04 DIAGNOSIS — R911 Solitary pulmonary nodule: Secondary | ICD-10-CM | POA: Diagnosis not present

## 2020-11-04 LAB — GLUCOSE, CAPILLARY: Glucose-Capillary: 151 mg/dL — ABNORMAL HIGH (ref 70–99)

## 2020-11-04 MED ORDER — FLUDEOXYGLUCOSE F - 18 (FDG) INJECTION
9.0000 | Freq: Once | INTRAVENOUS | Status: AC | PRN
  Administered 2020-11-04: 9.81 via INTRAVENOUS

## 2020-11-05 ENCOUNTER — Telehealth: Payer: Self-pay | Admitting: Gastroenterology

## 2020-11-05 NOTE — Telephone Encounter (Signed)
Letter mailed to patient returned to sender

## 2020-11-10 ENCOUNTER — Telehealth: Payer: Self-pay

## 2020-11-10 NOTE — Telephone Encounter (Signed)
FYI...Marland KitchenMarland Kitchen  I was unable to reach pt by phone. I called [place] where pt lives  And spoke with Jerl Mina that stated that no calls could be transferred but she would relay the to Ms Dura to contact the office back  [place].

## 2020-11-10 NOTE — Telephone Encounter (Signed)
-----   Message from [name], MD sent at 11/10/2020  8:32 AM EST ----- Please arrange MD visit for discussion with her about biopsy.

## 2020-11-10 NOTE — Telephone Encounter (Signed)
Please contact pt to schedule MD visit.

## 2020-11-10 NOTE — Telephone Encounter (Signed)
FYI...  Working on trying to contact pt.Marland Kitchen

## 2020-11-13 ENCOUNTER — Telehealth: Payer: Self-pay | Admitting: *Deleted

## 2020-11-13 NOTE — Telephone Encounter (Signed)
FYI....  I received a message from [facility] . Stating that pt called the office back this morning saying the she received a message to call.  I tried calling her back, but again (no answer) pt did state on her last visit that she was having telephone issues. I tried calling her niece Mrs. Cates and a detailed message was left on her VM. I then called [place] back were she lives and spoke with [place] and asked her if she could relay another message to Laury Deep asking her to please give me a call back so that I can get her appt sched as requested per MD on 11/10/20. I'll try calling her back as well.

## 2020-11-13 NOTE — Telephone Encounter (Signed)
FYI.Rene Paci been trying every hr on the hr trying to contact pt to get her sched for an MD visit for discussion of her biopsy per MD 11/10/20 pt message.Her phone is not allowing me to leave messages. I'll try to reaching out to [facility] again.

## 2020-11-14 NOTE — Telephone Encounter (Signed)
11/14/20  I spoke with Barbara Frank this morning. She stated that her appt had to sched for late nx week due to her transportation service having to have a 3 day notice. Pt will RTC on 11/21/20 "MD visit for discussion with her about biopsy" @ 8:45 (Early morning appt per pts request).

## 2020-11-21 ENCOUNTER — Inpatient Hospital Stay: Payer: Medicare PPO | Attending: Oncology | Admitting: Oncology

## 2020-11-21 ENCOUNTER — Encounter: Payer: Self-pay | Admitting: Oncology

## 2020-11-21 ENCOUNTER — Other Ambulatory Visit: Payer: Self-pay

## 2020-11-21 VITALS — BP 132/73 | HR 89 | Temp 96.7°F | Resp 18 | Wt 177.3 lb

## 2020-11-21 DIAGNOSIS — Z7984 Long term (current) use of oral hypoglycemic drugs: Secondary | ICD-10-CM | POA: Diagnosis not present

## 2020-11-21 DIAGNOSIS — Z7189 Other specified counseling: Secondary | ICD-10-CM

## 2020-11-21 DIAGNOSIS — R918 Other nonspecific abnormal finding of lung field: Secondary | ICD-10-CM | POA: Insufficient documentation

## 2020-11-21 DIAGNOSIS — E119 Type 2 diabetes mellitus without complications: Secondary | ICD-10-CM | POA: Diagnosis not present

## 2020-11-21 DIAGNOSIS — N2889 Other specified disorders of kidney and ureter: Secondary | ICD-10-CM | POA: Diagnosis not present

## 2020-11-21 DIAGNOSIS — R599 Enlarged lymph nodes, unspecified: Secondary | ICD-10-CM | POA: Insufficient documentation

## 2020-11-21 DIAGNOSIS — Z7901 Long term (current) use of anticoagulants: Secondary | ICD-10-CM | POA: Insufficient documentation

## 2020-11-21 DIAGNOSIS — F32A Depression, unspecified: Secondary | ICD-10-CM | POA: Insufficient documentation

## 2020-11-21 DIAGNOSIS — I4891 Unspecified atrial fibrillation: Secondary | ICD-10-CM | POA: Diagnosis not present

## 2020-11-21 NOTE — Progress Notes (Signed)
Patient here for follow up. No concerns voiced.  °

## 2020-11-21 NOTE — Progress Notes (Signed)
Hematology/Oncology Consult note [facility] Telephone:([phone] [phone]   Patient Care Team: Sheral Apley as PCP - General  REFERRING PROVIDER: No ref. provider found  CHIEF COMPLAINTS/REASON FOR VISIT:  Evaluation of right kidney mass  HISTORY OF PRESENTING ILLNESS:   Barbara Frank is a  75 y.o.  female with PMH listed below was seen in consultation at the request of  No ref. provider found  for evaluation of right kidney mass.   Patient is a poor historian. Her mood appears to be depressed.  She currently lives at assisted living facility. Moved from [place]. Widowed 3 years ago.  Her power of attorney is [name] who is her husband's niece. [name] was called during this encounter.   Extensive medical record review was performed. She has had multiple images done during the past few months, 05/27/20 CT head wo contrast- no acute intracranial abnormality.  06/01/20 CT abdomen and pelvis with contrast 4 mm proximal left ureteral calculus causing mild proximal left- sided hydroureteronephrosis with periureteric and perinephric stranding on the left.5.2cm enhancing right mid anterior kidney mass. Mildly enlarged right retroperitoneal retrocaval lymph nodes suspicious for nodal metastases Mild cardiomegaly, Small hiatal hernia. Status post gastric bypass surgery and cholecystectomy. Postoperative changes anterior abdominal wall. 06/05/20 CT head wo contrast - No acute intracranial abnormality. Probable tiny right frontal scalp Contusion. 2. Mild global volume loss and minimal chronic ischemic small vessel disease. 06/06/20 CT abdomen pelvis wo contrast 1. Indeterminate lesion in the right kidney which is stable may be a solid mass., 2. Partially calcified uterine fibroids. 3. Left UVJ calculus measuring 5 mm 06/07/20 CT head wo contrast.  1. No acute intracranial abnormality. No significant change. 2. Intracranial atherosclerosis, atrophy and  chronic small vessel ischemic changes. 3. Chronic/subacute right maxillary sinus disease.  06/07/20 Cystoscopy by [name] for Ureteroscopic stone extraction Admitted on 10/3/21due to failure to thrive,  anorexia, dehydration, electrolyte imbalances.   06/09/20 CT abdomen and chest with contrast 1. Right renal mass is stable in size. Most suggestive of a renal neoplasm. 2. Right retroperitoneal lymph nodes are stable in size; may well be metastatic. 3. Left hydronephrosis and hydroureter is stable. Pelvis is not imaged therefore if there is a distal calculus still present, it is not visible. 4. Sclerotic lesions in the anterior left seventh, eighth and ninth ribs which given distribution are most likely healed rib fractures.  She is on Eliquis 5mg  Bid for atrial fibrillation. She reports not taking anti depressants that were given by her PCP.  INTERVAL HISTORY Barbara Frank is a 75 y.o. female who has above history reviewed by me today presents for follow up visit for management of renal mass. Problems and complaints are listed below: 11/04/2020, PET scan was obtained. PET scan showed interval increasing size of right upper lobe pulmonary lesion, now 3 cm with an SUV of 3.8.  Associated with a new right lower lobe nodule 8 mm.  5.1 cm right renal mass hypermetabolic activity.  1.9 retrocaval lymph node with SUV of 4.8.  Second index retrocaval node with SUV of 2.  Nodular disease seen in the duodenum on previous study is not well demonstrated on noncontrast CT image today.  Some nodular FDG activity, nonspecific. Multiple anterior abdominal soft tissue nodules are evident.  13 mm anterior left nodule shows no substantial hypermetabolic with an SUV of 1.7.  Calcified left lateral nodule with an SUV of 2.9.  2.2 cm calcified nodule lobe right paracolic gutter with an  SUV of 2.5. Patient presents to discuss about PET scan results and her decisionon the next steps of diagnosis and  treatments. Denies any pain today.  Review of Systems  Constitutional: Positive for fatigue. Negative for appetite change, chills and fever.  HENT:   Negative for hearing loss and voice change.   Eyes: Negative for eye problems.  Respiratory: Negative for chest tightness and cough.   Cardiovascular: Negative for chest pain.  Gastrointestinal: Positive for constipation. Negative for abdominal distention, abdominal pain and blood in stool.  Endocrine: Negative for hot flashes.  Genitourinary: Negative for difficulty urinating and frequency.   Musculoskeletal: Negative for arthralgias.  Skin: Negative for itching and rash.  Neurological: Negative for extremity weakness.  Hematological: Negative for adenopathy.  Psychiatric/Behavioral: Positive for depression. Negative for confusion.    MEDICAL HISTORY:  Past Medical History:  Diagnosis Date  . A-fib ([place])   . Depression   . Diabetes mellitus without complication ([place])   . History of kidney stones   . Kidney mass     SURGICAL HISTORY: Past Surgical History:  Procedure Laterality Date  . GASTRIC BYPASS    . HERNIA REPAIR    . TONSILLECTOMY    . URETEROSCOPY WITH HOLMIUM LASER LITHOTRIPSY Left     SOCIAL HISTORY: Social History   Socioeconomic History  . Marital status: Widowed    Spouse name: Not on file  . Number of children: Not on file  . Years of education: Not on file  . Highest education level: Not on file  Occupational History  . Not on file  Tobacco Use  . Smoking status: Never Smoker  . Smokeless tobacco: Never Used  Vaping Use  . Vaping Use: Never used  Substance and Sexual Activity  . Alcohol use: Not Currently  . Drug use: Not Currently  . Sexual activity: Not Currently  Other Topics Concern  . Not on file  Social History Narrative  . Not on file   Social Determinants of Health   Financial Resource Strain: Not on file  Food Insecurity: Not on file  Transportation Needs: Not on file  Physical  Activity: Not on file  Stress: Not on file  Social Connections: Not on file  Intimate Partner Violence: Not on file    FAMILY HISTORY: Family History  Problem Relation Age of Onset  . Breast cancer Mother   . Bladder Cancer Neg Hx   . Kidney cancer Neg Hx   . Prostate cancer Neg Hx     ALLERGIES:  has No Known Allergies.  MEDICATIONS:  Current Outpatient Medications  Medication Sig Dispense Refill  . acetaminophen (TYLENOL) 650 MG CR tablet Take 650 mg by mouth every 4 (four) hours as needed for pain or fever.    . cetirizine (ZYRTEC) 10 MG tablet Take 10 mg by mouth daily.    . cyanocobalamin (,VITAMIN B-12,) 1000 MCG/ML injection Inject into the muscle.    Marland Kitchen ELIQUIS 5 MG TABS tablet Take 5 mg by mouth 2 (two) times daily.    . insulin glargine (LANTUS) 100 UNIT/ML injection Inject 25 Units into the skin 2 (two) times daily.    . metFORMIN (GLUCOPHAGE) 850 MG tablet Take 850 mg by mouth 2 (two) times daily with a meal.    . metoprolol succinate (TOPROL-XL) 50 MG 24 hr tablet Take 50 mg by mouth daily. Take with or immediately following a meal.    . Psyllium 48.57 % POWD Take by mouth. Takes 1 QD prn  No current facility-administered medications for this visit.     PHYSICAL EXAMINATION: ECOG PERFORMANCE STATUS: 2 - Symptomatic, <50% confined to bed Vitals:   11/21/20 0849  BP: 132/73  Pulse: 89  Resp: 18  Temp: (!) 96.7 F (35.9 C)   Filed Weights   11/21/20 0849  Weight: 177 lb 4.8 oz (80.4 kg)    Physical Exam Constitutional:      General: She is not in acute distress.    Comments: Frail elderly female walks with a walker.   HENT:     Head: Normocephalic and atraumatic.  Eyes:     General: No scleral icterus. Cardiovascular:     Rate and Rhythm: Normal rate. Rhythm irregular.     Heart sounds: Normal heart sounds.  Pulmonary:     Effort: Pulmonary effort is normal. No respiratory distress.     Breath sounds: No wheezing.  Abdominal:     General:  Bowel sounds are normal. There is no distension.     Palpations: Abdomen is soft.  Musculoskeletal:        General: No deformity. Normal range of motion.     Cervical back: Normal range of motion and neck supple.  Skin:    General: Skin is warm and dry.     Findings: No erythema or rash.  Neurological:     Mental Status: She is alert. Mental status is at baseline.     Cranial Nerves: No cranial nerve deficit.     Coordination: Coordination normal.     LABORATORY DATA:  I have reviewed the data as listed Lab Results  Component Value Date   WBC 7.5 09/26/2020   HGB 13.6 09/26/2020   HCT 41.2 09/26/2020   MCV 81.9 09/26/2020   PLT 230 09/26/2020   Recent Labs    06/20/20 2005 09/26/20 1243  NA 134* 133*  K 4.6 4.5  CL 98 99  CO2 26 27  GLUCOSE 303* 426*  BUN 12 16  CREATININE 0.58 0.70  CALCIUM 10.2 10.8*  GFRNONAA >60 >60  PROT  --  6.8  ALBUMIN  --  3.6  AST  --  17  ALT  --  13  ALKPHOS  --  98  BILITOT  --  0.6   Iron/TIBC/Ferritin/ %Sat No results found for: IRON, TIBC, FERRITIN, IRONPCTSAT    RADIOGRAPHIC STUDIES: I have personally reviewed the radiological images as listed and agreed with the findings in the report. NM PET Image Initial (PI) Skull Base To Thigh  Result Date: 11/05/2020 CLINICAL DATA:  Initial treatment strategy for renal mass and peritoneal nodules. EXAM: NUCLEAR MEDICINE PET SKULL BASE TO THIGH TECHNIQUE: 9.8 mCi F-18 FDG was injected intravenously. Full-ring PET imaging was performed from the skull base to thigh after the radiotracer. CT data was obtained and used for attenuation correction and anatomic localization. Fasting blood glucose: 151 mg/dl COMPARISON:  CT scan 10/03/2020 FINDINGS: Mediastinal blood pool activity: SUV max 2.6 Liver activity: SUV max NA NECK: No hypermetabolic lymph nodes in the neck. Incidental CT findings: none CHEST: 3 cm right upper lobe pulmonary lesion (image 82/3) is mildly hypermetabolic with SUV max = 3.8  this lesion has increased in size from 2.0 cm previously. 8 mm right lower lobe nodule on 101/3 is new in the interval but below threshold for detection on PET imaging. Incidental CT findings: Heart is enlarged. Coronary artery calcification is evident. Atherosclerotic calcification is noted in the wall of the thoracic aorta. Enlargement of the pulmonary outflow  tract suggests pulmonary arterial hypertension. ABDOMEN/PELVIS: 5.1 cm right renal mass is hypermetabolic consistent with renal cell carcinoma. 1.9 cm retrocaval lymph node seen on image 137/3 shows low level hypermetabolism with SUV max = 4.8. A second index retrocaval node identified previously shows no substantial hypermetabolism with SUV max = 2.0 (image 145/3 today). Nodular disease seen in the duodenum on the previous study is not well demonstrated on noncontrast CT imaging today. There is some nodular FDG accumulation in this region on PET imaging, nonspecific. As on prior CT, multiple anterior abdominal soft tissue nodules are evident. Patient may be status post omentectomy based on imaging, but these nodules are either omental, mesenteric or anterior peritoneal. 13 mm anterior left nodule on 153/3 shows no substantial hypermetabolism with SUV max = 1.7. Calcified left lateral nodule on 166/3 demonstrates SUV max = 2.9. 2.2 cm calcified nodule low right paracolic gutter on 585/2 demonstrates SUV max = 2.5. Incidental CT findings: Status post gastric bypass. There is abdominal aortic atherosclerosis without aneurysm. No intraperitoneal free fluid. Multiple calcified nodules are seen along the posterior uterus and in the cul-de-sac. SKELETON: No focal hypermetabolic activity to suggest skeletal metastasis. Incidental CT findings: No worrisome lytic or sclerotic osseous abnormality. IMPRESSION: 1. Interval increase in size of the right upper lobe pulmonary lesion showing low level hypermetabolism. Imaging features concerning and primary for neoplasm  bronchogenic etiology or metastatic disease would be considerations. This finding is associated with a new 8 mm right lower lobe pulmonary nodule. 2. Large right renal mass is hypermetabolic, highly suspicious for renal cell carcinoma. Adjacent retrocaval lymph nodes show low level uptake to mild hypermetabolism. Metastatic disease is a concern. 3. Calcified nodules in the peritoneal cavity as identified on previous diagnostic CT. The show low level FDG accumulation and may reflect infectious/inflammatory etiology or metastatic disease. 4. Enhancing nodularity seen in the duodenum on the previous CT is not evident on noncontrast CT imaging today. 5. No ascites. Electronically Signed   By: Misty Stanley M.D.   On: 11/05/2020 09:54      ASSESSMENT & PLAN:  1. Right kidney mass   2. Goals of care, counseling/discussion   3. Lung mass    #Right kidney mass with retroperitoneal lymphadenopathy, lung mass and peritoneal nodularity. PET scan was independently reviewed by me and discussed with patient. 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 I recommend patient to proceed with biopsy to establish tissue diagnosis.  Due to the complexity of her the condition she may needs more than one biopsy. Goals of care was discussed with patient.  Likely this is metastatic disease and cure is not available.  Palliative continue present treatment will be discussed after she establish tissue diagnosis. Patient is also on Eliquis 5 mg twice daily for anticoagulation for atrial fibrillation.  Will need cardiology clearance prior to the biopsy. It has been about 6 months since the renal mass was first discovered on her previous CTs at outside hospital, and has been 2 months since she establish care with me.  Patient remains undecided about her biopsy physician. She says that she has no family members, and is concerned about further decrease of her quality when she pursue  additional aggressive work-up and cancer treatments.  We discussed about options of proceed with biopsy then have another discussion about treatment plan versus proceed with hospice care.  She will think about this over the weekend and update me. we will arrange her to see palliative care  service next week.  Depression/greiving. Currently not on any medication as she does not want to take.  Follow-up with palliative care service.  #Hypercalcemia, calcium is 10.8, likely secondary to underlying malignancy.  Orders Placed This Encounter  Procedures  . Ambulatory Referral to Palliative Care    Referral Priority:   Routine    Referral Type:   Consultation    Referral Reason:   Advance Care Planning    Number of Visits Requested:   1    All questions were answered. The patient knows to call the clinic with any problems questions or concerns.  Return of visit: to be determined.  Thank you for this kind referral and the opportunity to participate in the care of this patient. A copy of today's note is routed to referring provider    [name], MD, PhD Hematology Oncology [facility] at [facility] Pager- [phone] 11/21/2020

## 2020-11-26 ENCOUNTER — Encounter: Payer: Self-pay | Admitting: Hospice and Palliative Medicine

## 2020-11-26 ENCOUNTER — Inpatient Hospital Stay (HOSPITAL_BASED_OUTPATIENT_CLINIC_OR_DEPARTMENT_OTHER): Payer: Medicare PPO | Admitting: Hospice and Palliative Medicine

## 2020-11-26 VITALS — BP 130/83 | HR 85 | Temp 97.8°F | Resp 20 | Wt 178.8 lb

## 2020-11-26 DIAGNOSIS — Z515 Encounter for palliative care: Secondary | ICD-10-CM

## 2020-11-26 DIAGNOSIS — N2889 Other specified disorders of kidney and ureter: Secondary | ICD-10-CM

## 2020-11-26 NOTE — Progress Notes (Signed)
[place]  [phone] [phone]   Name: Barbara Frank Date: 11/26/2020 MRN: [id number]  DOB: 07-Jun-1946  Patient Care Team: Sheral Apley as PCP - General    REASON FOR CONSULTATION: Barbara Frank is a 75 y.o. female with multiple medical problems including A. fib on Eliquis, depression, and diabetes, who was admitted in October 2021 in [facility] for failure to thrive.  Work-up including abdominal CT chest/abdomen/pelvis revealed a right renal mass and retroperitoneal adenopathy concerning for renal cell carcinoma.  Patient saw Dr. Tasia Catchings and was not interested in pursuing aggressive diagnostic work-up or treatment.  She was referred to urology to establish care.  She was also referred to palliative care to discuss goals.  SOCIAL HISTORY:     reports that she has never smoked. She has never used smokeless tobacco. She reports previous alcohol use. She reports previous drug use.  Patient was previously living near [facility] in a motel. Patient now lives in [facility].  She is widowed 3 years ago.  She has no children.  Her only living family is her husband's niece.  Patient is a retired Air cabin crew.  ADVANCE DIRECTIVES:  Patient's HC POA husband's niece, [name]  CODE STATUS:   PAST MEDICAL HISTORY: Past Medical History:  Diagnosis Date  . A-fib ([place])   . Depression   . Diabetes mellitus without complication ([place])   . History of kidney stones   . Kidney mass     PAST SURGICAL HISTORY:  Past Surgical History:  Procedure Laterality Date  . GASTRIC BYPASS    . HERNIA REPAIR    . TONSILLECTOMY    . URETEROSCOPY WITH HOLMIUM LASER LITHOTRIPSY Left     HEMATOLOGY/ONCOLOGY HISTORY:  Oncology History   No history exists.    ALLERGIES:  has No Known Allergies.  MEDICATIONS:  Current Outpatient Medications  Medication Sig Dispense Refill  . acetaminophen  (TYLENOL) 650 MG CR tablet Take 650 mg by mouth every 4 (four) hours as needed for pain or fever.    . cetirizine (ZYRTEC) 10 MG tablet Take 10 mg by mouth daily.    . cyanocobalamin (,VITAMIN B-12,) 1000 MCG/ML injection Inject into the muscle.    Marland Kitchen ELIQUIS 5 MG TABS tablet Take 5 mg by mouth 2 (two) times daily.    . insulin glargine (LANTUS) 100 UNIT/ML injection Inject 25 Units into the skin 2 (two) times daily.    . metFORMIN (GLUCOPHAGE) 850 MG tablet Take 850 mg by mouth 2 (two) times daily with a meal.    . metoprolol succinate (TOPROL-XL) 50 MG 24 hr tablet Take 50 mg by mouth daily. Take with or immediately following a meal.    . Psyllium 48.57 % POWD Take by mouth. Takes 1 QD prn     No current facility-administered medications for this visit.    VITAL SIGNS: BP 130/83   Pulse 85   Temp 97.8 F (36.6 C)   Resp 20   Wt 178 lb 12.8 oz (81.1 kg)   SpO2 100%   BMI 32.70 kg/m  Filed Weights   11/26/20 0929  Weight: 178 lb 12.8 oz (81.1 kg)    Estimated body mass index is 32.7 kg/m as calculated from the following:   Height as of 10/02/20: 5\' 2"  (1.575 m).   Weight as of this encounter: 178 lb 12.8 oz (81.1 kg).  LABS: CBC:    Component Value Date/Time  WBC 7.5 09/26/2020 1243   HGB 13.6 09/26/2020 1243   HCT 41.2 09/26/2020 1243   PLT 230 09/26/2020 1243   MCV 81.9 09/26/2020 1243   NEUTROABS 4.8 09/26/2020 1243   LYMPHSABS 1.8 09/26/2020 1243   MONOABS 0.6 09/26/2020 1243   EOSABS 0.2 09/26/2020 1243   BASOSABS 0.0 09/26/2020 1243   Comprehensive Metabolic Panel:    Component Value Date/Time   NA 133 (L) 09/26/2020 1243   K 4.5 09/26/2020 1243   CL 99 09/26/2020 1243   CO2 27 09/26/2020 1243   BUN 16 09/26/2020 1243   CREATININE 0.70 09/26/2020 1243   GLUCOSE 426 (H) 09/26/2020 1243   CALCIUM 10.8 (H) 09/26/2020 1243   AST 17 09/26/2020 1243   ALT 13 09/26/2020 1243   ALKPHOS 98 09/26/2020 1243   BILITOT 0.6 09/26/2020 1243   PROT 6.8 09/26/2020  1243   ALBUMIN 3.6 09/26/2020 1243    RADIOGRAPHIC STUDIES: NM PET Image Initial (PI) Skull Base To Thigh  Result Date: 11/05/2020 CLINICAL DATA:  Initial treatment strategy for renal mass and peritoneal nodules. EXAM: NUCLEAR MEDICINE PET SKULL BASE TO THIGH TECHNIQUE: 9.8 mCi F-18 FDG was injected intravenously. Full-ring PET imaging was performed from the skull base to thigh after the radiotracer. CT data was obtained and used for attenuation correction and anatomic localization. Fasting blood glucose: 151 mg/dl COMPARISON:  CT scan 10/03/2020 FINDINGS: Mediastinal blood pool activity: SUV max 2.6 Liver activity: SUV max NA NECK: No hypermetabolic lymph nodes in the neck. Incidental CT findings: none CHEST: 3 cm right upper lobe pulmonary lesion (image 82/3) is mildly hypermetabolic with SUV max = 3.8 this lesion has increased in size from 2.0 cm previously. 8 mm right lower lobe nodule on 101/3 is new in the interval but below threshold for detection on PET imaging. Incidental CT findings: Heart is enlarged. Coronary artery calcification is evident. Atherosclerotic calcification is noted in the wall of the thoracic aorta. Enlargement of the pulmonary outflow tract suggests pulmonary arterial hypertension. ABDOMEN/PELVIS: 5.1 cm right renal mass is hypermetabolic consistent with renal cell carcinoma. 1.9 cm retrocaval lymph node seen on image 137/3 shows low level hypermetabolism with SUV max = 4.8. A second index retrocaval node identified previously shows no substantial hypermetabolism with SUV max = 2.0 (image 145/3 today). Nodular disease seen in the duodenum on the previous study is not well demonstrated on noncontrast CT imaging today. There is some nodular FDG accumulation in this region on PET imaging, nonspecific. As on prior CT, multiple anterior abdominal soft tissue nodules are evident. Patient may be status post omentectomy based on imaging, but these nodules are either omental, mesenteric or  anterior peritoneal. 13 mm anterior left nodule on 153/3 shows no substantial hypermetabolism with SUV max = 1.7. Calcified left lateral nodule on 166/3 demonstrates SUV max = 2.9. 2.2 cm calcified nodule low right paracolic gutter on 950/9 demonstrates SUV max = 2.5. Incidental CT findings: Status post gastric bypass. There is abdominal aortic atherosclerosis without aneurysm. No intraperitoneal free fluid. Multiple calcified nodules are seen along the posterior uterus and in the cul-de-sac. SKELETON: No focal hypermetabolic activity to suggest skeletal metastasis. Incidental CT findings: No worrisome lytic or sclerotic osseous abnormality. IMPRESSION: 1. Interval increase in size of the right upper lobe pulmonary lesion showing low level hypermetabolism. Imaging features concerning and primary for neoplasm bronchogenic etiology or metastatic disease would be considerations. This finding is associated with a new 8 mm right lower lobe pulmonary nodule. 2. Large right  renal mass is hypermetabolic, highly suspicious for renal cell carcinoma. Adjacent retrocaval lymph nodes show low level uptake to mild hypermetabolism. Metastatic disease is a concern. 3. Calcified nodules in the peritoneal cavity as identified on previous diagnostic CT. The show low level FDG accumulation and may reflect infectious/inflammatory etiology or metastatic disease. 4. Enhancing nodularity seen in the duodenum on the previous CT is not evident on noncontrast CT imaging today. 5. No ascites. Electronically Signed   By: Misty Stanley M.D.   On: 11/05/2020 09:54    PERFORMANCE STATUS (ECOG) : 2 - Symptomatic, <50% confined to bed  Review of Systems Unless otherwise noted, a complete review of systems is negative.  Physical Exam General: NAD, thin, frail-appearing Pulmonary: unlabored Extremities: no edema, no joint deformities Skin: no rashes Neurological: Weakness but otherwise nonfocal  IMPRESSION: Patient presents today for  routine follow-up.  She reports that overall she is doing well.  She denies any significant symptomatic complaints.  She denies any significant changes or concerns today.  PET scan on 11/04/2020 revealed interval increase in size of right upper lobe pulmonary nodule concerning for metastatic disease or secondary neoplasm.  Patient also redemonstrated large hypermetabolic right renal mass and peritoneal nodules.  Treatment options were discussed by Dr. Tasia Catchings in a previous visit.  Patient was unsure if she wanted to pursue treatment.  Today, she says that she is willing to try systemic chemotherapy but wants to maintain the option of discontinuing treatment if the symptom burden becomes too high or her quality of life is negatively impacted.  Patient verbalized some financial issues with her independent living facility.  Will refer her to financial navigator for conversation regarding resources.  Patient continues to endorse depressive symptoms.  However, she is not interested in starting any medications today.  I reviewed with her a MOST form, which she took him to consider.  I requested the patient bring in her ACP documents to be scanned into the chart.  PLAN: -Continue current scope of treatment -Patient to bring in ACP documents -MOST form reviewed -Referral to home-based palliative care -RTC as needed   Patient expressed understanding and was in agreement with this plan. She also understands that She can call the clinic at any time with any questions, concerns, or complaints.     Time Total: 35 minutes  Visit consisted of counseling and education dealing with the complex and emotionally intense issues of symptom management and palliative care in the setting of serious and potentially life-threatening illness.Greater than 50%  of this time was spent counseling and coordinating care related to the above assessment and plan.  Signed by: Altha Harm, PhD, NP-C

## 2020-11-26 NOTE — Progress Notes (Signed)
Patient states she doesn't know if she wants to do treatments.

## 2020-11-27 ENCOUNTER — Other Ambulatory Visit: Payer: Medicare PPO

## 2020-11-27 ENCOUNTER — Telehealth: Payer: Self-pay | Admitting: Adult Health Nurse Practitioner

## 2020-11-27 NOTE — Telephone Encounter (Signed)
Called patient to offer to schedule a Palliative Consult, no answer and I was unable to leave a message due to no voicemail set up.  I then called patient's niece, [name], to let her know that I haven't been able to speak with patient to schedule a visit and that we had rec'd a second referral from the [facility] on her.  I asked [name] to please give me a call back by the end of the and that if I did not hear back from her that I would cancel the referral and notify the [facility] that we have been unable to speak with patient to schedule.

## 2020-11-27 NOTE — Progress Notes (Cosign Needed)
Tumor Board Documentation  Mariavictoria Nottingham was presented by [name] at our Tumor Board on 11/27/2020, which included representatives from medical oncology,radiation oncology,internal medicine,navigation,pathology,radiology,surgical,pharmacy,genetics,research,palliative care,pulmonology.  Seba currently presents as a current patient,for discussion with history of the following treatments:  .  Additionally, we reviewed previous medical and familial history, history of present illness, and recent lab results along with all available histopathologic and imaging studies. The tumor board considered available treatment options and made the following recommendations:      The following procedures/referrals were also placed: No orders of the defined types were placed in this encounter.   Clinical Trial Status:     Staging used:    National site-specific guidelines   were discussed with respect to the case.  Tumor board is a meeting of clinicians from various specialty areas who evaluate and discuss patients for whom a multidisciplinary approach is being considered. Final determinations in the plan of care are those of the provider(s). The responsibility for follow up of recommendations given during tumor board is that of the provider.   Today's extended care, comprehensive team conference, Dashonna was not present for the discussion and was not examined.   Multidisciplinary Tumor Board is a multidisciplinary case peer review process.  Decisions discussed in the Multidisciplinary Tumor Board reflect the opinions of the specialists present at the conference without having examined the patient.  Ultimately, treatment and diagnostic decisions rest with the primary provider(s) and the patient.

## 2020-11-27 NOTE — Progress Notes (Signed)
Tumor Board Documentation  Barbara Frank was presented by [name] at our Tumor Board on 11/27/2020, which included representatives from medical oncology,radiation oncology,internal medicine,navigation,pathology,radiology,surgical,pharmacy,genetics,research,palliative care,pulmonology.  Barbara Frank currently presents as a current patient,for discussion with history of the following treatments: active survellience.  Additionally, we reviewed previous medical and familial history, history of present illness, and recent lab results along with all available histopathologic and imaging studies. The tumor board considered available treatment options and made the following recommendations: Biopsy (Of Kidney)    The following procedures/referrals were also placed: No orders of the defined types were placed in this encounter.   Clinical Trial Status: not discussed   Staging used: To be determined  National site-specific guidelines   were discussed with respect to the case.  Tumor board is a meeting of clinicians from various specialty areas who evaluate and discuss patients for whom a multidisciplinary approach is being considered. Final determinations in the plan of care are those of the provider(s). The responsibility for follow up of recommendations given during tumor board is that of the provider.   Today's extended care, comprehensive team conference, Barbara Frank was not present for the discussion and was not examined.   Multidisciplinary Tumor Board is a multidisciplinary case peer review process.  Decisions discussed in the Multidisciplinary Tumor Board reflect the opinions of the specialists present at the conference without having examined the patient.  Ultimately, treatment and diagnostic decisions rest with the primary provider(s) and the patient.

## 2020-11-28 ENCOUNTER — Telehealth: Payer: Self-pay

## 2020-11-28 NOTE — Telephone Encounter (Signed)
-----   Message from [place], [place] sent at 11/27/2020  2:34 PM EDT ----- [name]- I am not familiar with patient but I will be happy to call her if you haven't already.   Angie Fava ----- Message ----- From: [name], MD Sent: 11/27/2020   1:47 PM EDT To: Irean Hong, NP, Evelina Dun, RN, #  I called her this afternoon and I am not able to reach her for a follow up of her decision.  Her case was discussed on tumor board and consensus decision was kidney biopsy to establish tissue diagnosis. Please reach out to her and see if she is interested.  If yes, please arrange IR to do kidney mass biopsy and I see her 1 week after biopsy. She will need to have cardiology clearance to stop eliquis for biopsy.  If not please inform me and Vonna Kotyk and comfort care and hospice is recommended.

## 2020-11-28 NOTE — Telephone Encounter (Signed)
Unable to reach patient after multiple attempts and VM not set up to leave messages. Called pt's niece [name] and left VM for her to have Ms. Wahlquist call us back. Call back number provided.

## 2020-12-02 NOTE — Telephone Encounter (Signed)
Attempted to call pt again and still unable to reach her. Left VM on niece's cell phone again. Still have not gotten a call back from last week.

## 2020-12-04 ENCOUNTER — Other Ambulatory Visit: Payer: Self-pay

## 2020-12-04 DIAGNOSIS — N2889 Other specified disorders of kidney and ureter: Secondary | ICD-10-CM

## 2020-12-04 NOTE — Telephone Encounter (Signed)
Returned pt call and she is in agreement with Kidney biopsy. I told her we will need cardiology clearance for her to stop Eliquis and then once we obtain that we can get her scheduled. Pt agrees and since we are having a hard time reaching her, she will call on Monday for an update.

## 2020-12-04 NOTE — Telephone Encounter (Signed)
Pt left message on scheduling line 3/31 at 204 pm requesting call back.  Routing to clinical team for follow up as they have been trying to reach her.

## 2020-12-04 NOTE — Telephone Encounter (Addendum)
Anticoagulation clearance request for Eliquis faxed to Virgie Dad, [place] (attening provider at [facility]).  Ph: [phone] Fax: [phone]  Fax confirmation received. Request for Kidney biopsy faxed to specialized scheduling with note stating that we are working on getting anticoagulation clearance.

## 2020-12-05 NOTE — Telephone Encounter (Addendum)
Spoke to [place], [place] for [facility], who confirmed that she received form and has forwarded to [facility] for review.    Direct Office #: [phone]

## 2020-12-05 NOTE — Telephone Encounter (Signed)
Unable to reach Barbara Frank to follow up on anticoagulation clearance request. Left message on her VM to call back and confirm if they have received form or if there is a different fax number that we need to fax it to.

## 2020-12-09 NOTE — Telephone Encounter (Signed)
FYI...  I tried calling pt to make her aware of hr sched BX date and her RTC 1 week after bx to see Dr. Tasia Catchings  for Results..(no answer)  her phone  Isn't setup for her to receive VM... I'll continue to try calling her.

## 2020-12-09 NOTE — Telephone Encounter (Signed)
Barbara Frank, will you see if you can reach patient to let her know about her biopsy appt and schedule her to see Md 1 week after bx?  Anticoagulation clearance scanned in patients chart.  Patient is scheduled for biopsy on Wed 4/13 at [place] and to arrive at [place].  The IR nurse will call and go over her instructions with her prior to her procedure.

## 2020-12-09 NOTE — Telephone Encounter (Signed)
Unsuccessfully attempt at calling patient.

## 2020-12-10 NOTE — Telephone Encounter (Signed)
FYI...   I tried calling pt again today and was unable to reach her. I'll try calling her again later today.

## 2020-12-10 NOTE — Telephone Encounter (Signed)
FYI.Marland KitchenMarland KitchenMarland KitchenMarland Kitchen  Pt has been sched for a BX and to RTC 1 week after BX to see MD. I have been unable to reach her by phone. I'm going to try calling her again today and also mail out an update appt letter. Hopefully, she'll contact the office to receive the information needed regarding her sched Bx.

## 2020-12-11 NOTE — Telephone Encounter (Signed)
Done...  Pt called today and was made aware her BX appts with Instructions  and she's aware of her RTC appt to see Dr.Yu 1 week after.

## 2020-12-11 NOTE — Telephone Encounter (Signed)
Talked with patient's niece to let her know about bx appt as well.  Also asked for to relay the message with holding Eliquis instructions.  I will continue to try and talk to patient with instructions as well.

## 2020-12-12 ENCOUNTER — Other Ambulatory Visit: Payer: Self-pay | Admitting: Radiology

## 2020-12-12 NOTE — OR Nursing (Signed)
Spoke with patients niece, Almon Hercules (pt didn't answer phone)  last dose Eliqius Sunday 4/10, Hold all meds except Metoprolol Wed. NPO after midnight. half dose of insulin Tuesday evening if she takes in evening. Hold Meformin after Tuesday morning dose. Have someone to drive her home. Niece said pt has set up bus transportation with her assisted living center [facility] but Niece plans to transport her to hospital and back. Niece is going to [facility] to share these instructions with patient.

## 2020-12-16 ENCOUNTER — Other Ambulatory Visit: Payer: Self-pay | Admitting: Student

## 2020-12-17 ENCOUNTER — Ambulatory Visit
Admission: RE | Admit: 2020-12-17 | Discharge: 2020-12-17 | Disposition: A | Discharge status: Home / Self Care | Payer: Medicare PPO | Source: Ambulatory Visit | Attending: Oncology | Admitting: Oncology

## 2020-12-17 ENCOUNTER — Ambulatory Visit: Admission: RE | Admit: 2020-12-17 | Payer: Medicare PPO | Source: Ambulatory Visit

## 2020-12-17 ENCOUNTER — Other Ambulatory Visit: Payer: Self-pay | Admitting: Oncology

## 2020-12-17 ENCOUNTER — Other Ambulatory Visit: Payer: Self-pay

## 2020-12-17 DIAGNOSIS — Z7901 Long term (current) use of anticoagulants: Secondary | ICD-10-CM | POA: Diagnosis not present

## 2020-12-17 DIAGNOSIS — E119 Type 2 diabetes mellitus without complications: Secondary | ICD-10-CM | POA: Insufficient documentation

## 2020-12-17 DIAGNOSIS — Z79899 Other long term (current) drug therapy: Secondary | ICD-10-CM | POA: Insufficient documentation

## 2020-12-17 DIAGNOSIS — Z794 Long term (current) use of insulin: Secondary | ICD-10-CM | POA: Insufficient documentation

## 2020-12-17 DIAGNOSIS — N2889 Other specified disorders of kidney and ureter: Secondary | ICD-10-CM

## 2020-12-17 DIAGNOSIS — C641 Malignant neoplasm of right kidney, except renal pelvis: Secondary | ICD-10-CM | POA: Insufficient documentation

## 2020-12-17 DIAGNOSIS — Z7984 Long term (current) use of oral hypoglycemic drugs: Secondary | ICD-10-CM | POA: Diagnosis not present

## 2020-12-17 DIAGNOSIS — I4891 Unspecified atrial fibrillation: Secondary | ICD-10-CM | POA: Insufficient documentation

## 2020-12-17 LAB — CBC
HCT: 42.4 % (ref 36.0–46.0)
Hemoglobin: 14 g/dL (ref 12.0–15.0)
MCH: 27 pg (ref 26.0–34.0)
MCHC: 33 g/dL (ref 30.0–36.0)
MCV: 81.9 fL (ref 80.0–100.0)
Platelets: 227 10*3/uL (ref 150–400)
RBC: 5.18 MIL/uL — ABNORMAL HIGH (ref 3.87–5.11)
RDW: 14.3 % (ref 11.5–15.5)
WBC: 8 10*3/uL (ref 4.0–10.5)
nRBC: 0 % (ref 0.0–0.2)

## 2020-12-17 LAB — GLUCOSE, CAPILLARY: Glucose-Capillary: 369 mg/dL — ABNORMAL HIGH (ref 70–99)

## 2020-12-17 LAB — PROTIME-INR
INR: 1 (ref 0.8–1.2)
Prothrombin Time: 13.1 seconds (ref 11.4–15.2)

## 2020-12-17 MED ORDER — SODIUM CHLORIDE 0.9 % IV SOLN: INTRAVENOUS | Status: DC

## 2020-12-17 MED ORDER — FENTANYL CITRATE (PF) 100 MCG/2ML IJ SOLN
INTRAMUSCULAR | Status: AC | PRN
  Administered 2020-12-17: 50 ug via INTRAVENOUS

## 2020-12-17 MED ORDER — MIDAZOLAM HCL 2 MG/2ML IJ SOLN
INTRAMUSCULAR | Status: AC
  Filled 2020-12-17: qty 2

## 2020-12-17 MED ORDER — MIDAZOLAM HCL 2 MG/2ML IJ SOLN
INTRAMUSCULAR | Status: AC | PRN
  Administered 2020-12-17: 1 mg via INTRAVENOUS

## 2020-12-17 MED ORDER — FENTANYL CITRATE (PF) 100 MCG/2ML IJ SOLN
INTRAMUSCULAR | Status: AC
  Filled 2020-12-17: qty 2

## 2020-12-17 NOTE — Procedures (Signed)
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 female with past medical history significant for atrial fibrillation (currently on Eliquis) and diabetes who presents today for CT-guided biopsy of worrisome right renal mass for tissue diagnostic purposes.  Note, preprocedural imaging was discussed with referring oncologist, Dr. Tasia Catchings and the decision was made to proceed with image guided biopsy of the right renal lesion as opposed to the adjacent perirenal lymph node as it is uncertain whether the patient will undergo treatment in the provided oncologist simply wishes to establish a definitive pathologic diagnosis.  Patient states that her energy level is somewhat decreased.  She reports intermittent fleeting right-sided flank pain which she is unsure if this is related to the right-sided renal lesion.  She is otherwise without complaint.  Specifically, no change in appetite.  No hematuria.  No chest pain or shortness of breath.  No fever or chills.  Past Medical History:  Diagnosis Date  . A-fib ([place])   . Depression   . Diabetes mellitus without complication ([place])   . History of kidney stones   . Kidney mass     Past Surgical History:  Procedure Laterality Date  . GASTRIC BYPASS    . HERNIA REPAIR    . TONSILLECTOMY    . URETEROSCOPY WITH HOLMIUM LASER LITHOTRIPSY Left     Allergies: Kiwi extract  Medications: Prior to Admission medications   Medication Sig Start Date End Date Taking? Authorizing Provider  acetaminophen (TYLENOL) 650 MG CR tablet Take 650 mg by mouth every 4 (four) hours as needed for pain or fever.    [provider]  cetirizine (ZYRTEC) 10 MG tablet Take 10 mg by mouth daily.    [provider]  cyanocobalamin (,VITAMIN B-12,) 1000 MCG/ML injection Inject into the muscle. 09/24/20   [provider]  ELIQUIS 5 MG TABS tablet Take 5 mg by mouth 2 (two) times daily. 08/18/20   [provider]  insulin glargine (LANTUS) 100 UNIT/ML injection Inject 25 Units into the skin 2 (two) times daily.    [provider]  metFORMIN (GLUCOPHAGE) 850 MG tablet Take 850 mg by mouth 2 (two) times daily with a meal.    [provider]  metoprolol succinate (TOPROL-XL) 50 MG 24 hr tablet Take 50 mg by mouth daily. Take with or immediately following a meal.    [provider]  Psyllium 48.57 % POWD Take by mouth. Takes 1 QD prn    [provider]     Family History  Problem Relation Age of Onset  . Breast cancer Mother   . Bladder Cancer Neg Hx   . Kidney cancer Neg Hx   . Prostate cancer Neg Hx     Social History   Socioeconomic History  . Marital status: Widowed    Spouse name: Not on file  . Number of children: Not on file  . Years of education: Not on file  . Highest education level: Not on file  Occupational History  . Not on file  Tobacco Use  . Smoking status: Never Smoker  . Smokeless tobacco: Never Used  Vaping Use  . Vaping Use: Never used  Substance and Sexual Activity  . Alcohol use: Not Currently  . Drug use: Not Currently  . Sexual activity: Not Currently  Other Topics Concern  . Not on file  Social History  Narrative  . Not on file   Social Determinants of Health   Financial Resource Strain: Not on file  Food Insecurity: Not on file  Transportation Needs: Not on file  Physical Activity: Not on file  Stress: Not on file  Social Connections: Not on file    ECOG Status: 2 - Symptomatic, <50% confined to bed  Review of Systems: A 12 point ROS discussed and pertinent positives are indicated in the HPI above.  All other systems are negative.  Review of Systems  Vital Signs: There were no vitals taken for this visit.  Physical Exam  Imaging: No results found.  Labs:  CBC: Recent Labs     06/20/20 1703 09/26/20 1243 12/17/20 0921  WBC 6.5 7.5 8.0  HGB 12.4 13.6 14.0  HCT 39.4 41.2 42.4  PLT 195 230 227    COAGS: Recent Labs    12/17/20 0921  INR 1.0    BMP: Recent Labs    06/20/20 2005 09/26/20 1243  NA 134* 133*  K 4.6 4.5  CL 98 99  CO2 26 27  GLUCOSE 303* 426*  BUN 12 16  CALCIUM 10.2 10.8*  CREATININE 0.58 0.70  GFRNONAA >60 >60    LIVER FUNCTION TESTS: Recent Labs    09/26/20 1243  BILITOT 0.6  AST 17  ALT 13  ALKPHOS 98  PROT 6.8  ALBUMIN 3.6    TUMOR MARKERS: No results for input(s): AFPTM, CEA, CA199, CHROMGRNA in the last 8760 hours.  Assessment and Plan:  Barbara Frank is a 75 y.o. female with past medical history significant for atrial fibrillation (currently on Eliquis) and diabetes who presents today for CT-guided biopsy of worrisome right renal mass for tissue diagnostic purposes.  Note, preprocedural imaging was discussed with referring oncologist, Dr. Tasia Catchings and the decision was made to proceed with image guided biopsy of the right renal lesion as opposed to the adjacent perirenal lymph node as it is uncertain whether the patient will undergo treatment in the provided oncologist simply wishes to establish a definitive pathologic diagnosis.  Patient states that her energy level is somewhat decreased.  She reports intermittent fleeting right-sided flank pain which she is unsure if this is related to the right-sided renal lesion.  She is otherwise without complaint.    Risks and benefits of image guided renal renal mass biopsy was discussed with the patient and/or patient's family including, but not limited to bleeding, infection, damage to adjacent structures or low yield requiring additional tests.  All of the questions were answered and there is agreement to proceed.  Consent signed and in chart.  Thank you for this interesting consult.  I greatly enjoyed meeting Barbara Frank and look forward to participating in their  care.  A copy of this report was sent to the requesting provider on this date.  Electronically Signed: Sandi Mariscal, MD 12/17/2020, 10:31 AM   I spent a total of 15 Minutes in face to face in clinical consultation, greater than 50% of which was counseling/coordinating care for image guided right renal mass biopsy.

## 2020-12-17 NOTE — Discharge Instructions (Signed)
Percutaneous Kidney Biopsy, Care After This sheet gives you information about how to care for yourself after your procedure. Your health care provider may also give you more specific instructions. If you have problems or questions, contact your health care provider. What can I expect after the procedure? After the procedure, it is common to have:  Pain or soreness near the biopsy site.  Pink or cloudy urine for 24 hours after the procedure. This is normal. Follow these instructions at home: Activity  Return to your normal activities as told by your health care provider. Ask your health care provider what activities are safe for you.  If you were given a sedative during the procedure, it can affect you for several hours. Do not drive or operate machinery until your health care provider says that it is safe.  Do not lift anything that is heavier than 10 lb (4.5 kg), or the limit that you are told, until your health care provider says that it is safe.  Avoid activities that take a lot of effort until your health care provider approves. Most people will have to wait 2 weeks before returning to activities such as exercise or sex. General instructions  Take over-the-counter and prescription medicines only as told by your health care provider.  Follow instructions from your health care provider about eating or drinking restrictions.  Check your biopsy site every day for signs of infection. Check for: ? More redness, swelling, or pain. ? Fluid or blood. ? Warmth. ? Pus or a bad smell.  Keep all follow-up visits as told by your health care provider. This is important.   Contact a health care provider if:  You have more redness, swelling, or pain around your biopsy site.  You have fluid or blood coming from your biopsy site.  Your biopsy site feels warm to the touch.  You have pus or a bad smell coming from your biopsy site.  You have blood in your urine more than 24 hours after your  procedure. Get help right away if:  Your urine is dark red or brown.  You have a fever.  You are not able to urinate.  You feel burning when you urinate.  You feel dizzy or light-headed.  You have severe pain in your abdomen or side. Summary  After the procedure, it is common to have pain or soreness at the biopsy site and pink or cloudy urine for the first 24 hours.  Check your biopsy site each day for signs of infection, such as more redness, swelling, or pain; fluid, blood, pus or a bad smell coming from the biopsy site; or the biopsy site feeling warm to the touch.  Return to your normal activities as told by your health care provider. This information is not intended to replace advice given to you by your health care provider. Make sure you discuss any questions you have with your health care provider. Document Revised: 11/16/2019 Document Reviewed: 11/16/2019 Elsevier Patient Education  2021 Elsevier Inc.  

## 2020-12-17 NOTE — Procedures (Signed)
Pre Procedure Dx: Right sided renal mass Post Procedural Dx: Same  Technically successful US guided biopsy of worrisome right sided renal mass.   EBL: Trace  No immediate complications.   Ronny Bacon, MD Pager #: [phone]

## 2020-12-24 ENCOUNTER — Inpatient Hospital Stay: Payer: Medicare PPO | Attending: Oncology | Admitting: Oncology

## 2020-12-24 ENCOUNTER — Encounter: Payer: Self-pay | Admitting: Oncology

## 2020-12-24 VITALS — BP 131/75 | HR 120 | Temp 97.4°F | Resp 18 | Wt 170.5 lb

## 2020-12-24 DIAGNOSIS — C641 Malignant neoplasm of right kidney, except renal pelvis: Secondary | ICD-10-CM | POA: Diagnosis not present

## 2020-12-24 DIAGNOSIS — R918 Other nonspecific abnormal finding of lung field: Secondary | ICD-10-CM | POA: Insufficient documentation

## 2020-12-24 DIAGNOSIS — Z7189 Other specified counseling: Secondary | ICD-10-CM | POA: Diagnosis not present

## 2020-12-24 DIAGNOSIS — N2889 Other specified disorders of kidney and ureter: Secondary | ICD-10-CM | POA: Insufficient documentation

## 2020-12-24 DIAGNOSIS — E1165 Type 2 diabetes mellitus with hyperglycemia: Secondary | ICD-10-CM | POA: Diagnosis not present

## 2020-12-24 NOTE — Progress Notes (Signed)
START ON PATHWAY REGIMEN - Renal Cell     Administer 4 weeks on followed by 2 weeks off:     Sunitinib   **Always confirm dose/schedule in your pharmacy ordering system**  Patient Characteristics: Stage IV/Metastatic Disease, Non Clear Cell, First Line Therapeutic Status: Stage IV/Metastatic Disease Histology: Non Clear Cell Line of Therapy: First Line Intent of Therapy: Non-Curative / Palliative Intent, Discussed with Patient

## 2020-12-24 NOTE — Progress Notes (Signed)
Hematology/Oncology Consult note [facility] Telephone:([phone] [phone]   Patient Care Team: Sheral Apley as PCP - General  REFERRING PROVIDER: No ref. provider found  CHIEF COMPLAINTS/REASON FOR VISIT:  RCC  HISTORY OF PRESENTING ILLNESS:   Barbara Frank is a  75 y.o.  female with PMH listed below was seen in consultation at the request of  No ref. provider found  for evaluation of right kidney mass.   Patient is a poor historian. Her mood appears to be depressed.  She currently lives at assisted living facility. Moved from [place]. Widowed 3 years ago.  Her power of attorney is [name] who is her husband's niece. [name] was called during this encounter.   Extensive medical record review was performed. She has had multiple images done during the past few months, 05/27/20 CT head wo contrast- no acute intracranial abnormality.  06/01/20 CT abdomen and pelvis with contrast 4 mm proximal left ureteral calculus causing mild proximal left- sided hydroureteronephrosis with periureteric and perinephric stranding on the left.5.2cm enhancing right mid anterior kidney mass. Mildly enlarged right retroperitoneal retrocaval lymph nodes suspicious for nodal metastases Mild cardiomegaly, Small hiatal hernia. Status post gastric bypass surgery and cholecystectomy. Postoperative changes anterior abdominal wall. 06/05/20 CT head wo contrast - No acute intracranial abnormality. Probable tiny right frontal scalp Contusion. 2. Mild global volume loss and minimal chronic ischemic small vessel disease. 06/06/20 CT abdomen pelvis wo contrast 1. Indeterminate lesion in the right kidney which is stable may be a solid mass., 2. Partially calcified uterine fibroids. 3. Left UVJ calculus measuring 5 mm 06/07/20 CT head wo contrast.  1. No acute intracranial abnormality. No significant change. 2. Intracranial atherosclerosis, atrophy and chronic small vessel ischemic  changes. 3. Chronic/subacute right maxillary sinus disease.  06/07/20 Cystoscopy by [name] for Ureteroscopic stone extraction Admitted on 10/3/21due to failure to thrive,  anorexia, dehydration, electrolyte imbalances.   06/09/20 CT abdomen and chest with contrast 1. Right renal mass is stable in size. Most suggestive of a renal neoplasm. 2. Right retroperitoneal lymph nodes are stable in size; may well be metastatic. 3. Left hydronephrosis and hydroureter is stable. Pelvis is not imaged therefore if there is a distal calculus still present, it is not visible. 4. Sclerotic lesions in the anterior left seventh, eighth and ninth ribs which given distribution are most likely healed rib fractures.  She is on Eliquis 5mg  Bid for atrial fibrillation. She reports not taking anti depressants that were given by her PCP.  INTERVAL HISTORY Barbara Frank is a 75 y.o. female who has above history reviewed by me today presents for follow up visit for management of renal mass. Problems and complaints are listed below: 11/04/2020, PET scan was obtained. PET scan showed interval increasing size of right upper lobe pulmonary lesion, now 3 cm with an SUV of 3.8.  Associated with a new right lower lobe nodule 8 mm.  5.1 cm right renal mass hypermetabolic activity.  1.9 retrocaval lymph node with SUV of 4.8.  Second index retrocaval node with SUV of 2.  Nodular disease seen in the duodenum on previous study is not well demonstrated on noncontrast CT image today.  Some nodular FDG activity, nonspecific. Multiple anterior abdominal soft tissue nodules are evident.  13 mm anterior left nodule shows no substantial hypermetabolic with an SUV of 1.7.  Calcified left lateral nodule with an SUV of 2.9.  2.2 cm calcified nodule lobe right paracolic gutter with an SUV of 2.5. Patient  presents to discuss about PET scan results and her decisionon the next steps of diagnosis and treatments. Denies any pain  today.  Review of Systems  Constitutional: Positive for fatigue. Negative for appetite change, chills and fever.  HENT:   Negative for hearing loss and voice change.   Eyes: Negative for eye problems.  Respiratory: Negative for chest tightness and cough.   Cardiovascular: Negative for chest pain.  Gastrointestinal: Positive for constipation. Negative for abdominal distention, abdominal pain and blood in stool.  Endocrine: Negative for hot flashes.  Genitourinary: Negative for difficulty urinating and frequency.   Musculoskeletal: Negative for arthralgias.  Skin: Negative for itching and rash.  Neurological: Negative for extremity weakness.  Hematological: Negative for adenopathy.  Psychiatric/Behavioral: Positive for depression. Negative for confusion.    MEDICAL HISTORY:  Past Medical History:  Diagnosis Date  . A-fib ([place])   . Depression   . Diabetes mellitus without complication ([place])   . History of kidney stones   . Kidney mass     SURGICAL HISTORY: Past Surgical History:  Procedure Laterality Date  . GASTRIC BYPASS    . HERNIA REPAIR    . TONSILLECTOMY    . URETEROSCOPY WITH HOLMIUM LASER LITHOTRIPSY Left     SOCIAL HISTORY: Social History   Socioeconomic History  . Marital status: Widowed    Spouse name: Not on file  . Number of children: Not on file  . Years of education: Not on file  . Highest education level: Not on file  Occupational History  . Not on file  Tobacco Use  . Smoking status: Never Smoker  . Smokeless tobacco: Never Used  Vaping Use  . Vaping Use: Never used  Substance and Sexual Activity  . Alcohol use: Not Currently  . Drug use: Not Currently  . Sexual activity: Not Currently  Other Topics Concern  . Not on file  Social History Narrative  . Not on file   Social Determinants of Health   Financial Resource Strain: Not on file  Food Insecurity: Not on file  Transportation Needs: Not on file  Physical Activity: Not on file   Stress: Not on file  Social Connections: Not on file  Intimate Partner Violence: Not on file    FAMILY HISTORY: Family History  Problem Relation Age of Onset  . Breast cancer Mother   . Bladder Cancer Neg Hx   . Kidney cancer Neg Hx   . Prostate cancer Neg Hx     ALLERGIES:  is allergic to kiwi extract.  MEDICATIONS:  Current Outpatient Medications  Medication Sig Dispense Refill  . acetaminophen (TYLENOL) 650 MG CR tablet Take 650 mg by mouth every 4 (four) hours as needed for pain or fever.    . cetirizine (ZYRTEC) 10 MG tablet Take 10 mg by mouth daily.    . cyanocobalamin (,VITAMIN B-12,) 1000 MCG/ML injection Inject into the muscle.    Marland Kitchen ELIQUIS 5 MG TABS tablet Take 5 mg by mouth 2 (two) times daily.    . insulin glargine (LANTUS) 100 UNIT/ML injection Inject 27 Units into the skin 2 (two) times daily. 27 u in the morning and 27 u at night    . metFORMIN (GLUCOPHAGE) 850 MG tablet Take 1,000 mg by mouth 2 (two) times daily with a meal.    . metoprolol succinate (TOPROL-XL) 50 MG 24 hr tablet Take 50 mg by mouth daily. Take with or immediately following a meal.    . Psyllium 48.57 % POWD Take by  mouth. Takes 1 QD prn     No current facility-administered medications for this visit.     PHYSICAL EXAMINATION: ECOG PERFORMANCE STATUS: 2 - Symptomatic, <50% confined to bed Vitals:   12/24/20 1335  BP: 131/75  Pulse: (!) 120  Resp: 18  Temp: (!) 97.4 F (36.3 C)  SpO2: 97%   Filed Weights   12/24/20 1335  Weight: 170 lb 8 oz (77.3 kg)    Physical Exam Constitutional:      General: She is not in acute distress.    Comments: Frail elderly female walks with a walker.   HENT:     Head: Normocephalic and atraumatic.  Eyes:     General: No scleral icterus. Cardiovascular:     Rate and Rhythm: Normal rate. Rhythm irregular.     Heart sounds: Normal heart sounds.  Pulmonary:     Effort: Pulmonary effort is normal. No respiratory distress.     Breath sounds: No  wheezing.  Abdominal:     General: Bowel sounds are normal. There is no distension.     Palpations: Abdomen is soft.  Musculoskeletal:        General: No deformity. Normal range of motion.     Cervical back: Normal range of motion and neck supple.  Skin:    General: Skin is warm and dry.     Findings: No erythema or rash.  Neurological:     Mental Status: She is alert. Mental status is at baseline.     Cranial Nerves: No cranial nerve deficit.     Coordination: Coordination normal.     LABORATORY DATA:  I have reviewed the data as listed Lab Results  Component Value Date   WBC 8.0 12/17/2020   HGB 14.0 12/17/2020   HCT 42.4 12/17/2020   MCV 81.9 12/17/2020   PLT 227 12/17/2020   Recent Labs    06/20/20 2005 09/26/20 1243  NA 134* 133*  K 4.6 4.5  CL 98 99  CO2 26 27  GLUCOSE 303* 426*  BUN 12 16  CREATININE 0.58 0.70  CALCIUM 10.2 10.8*  GFRNONAA >60 >60  PROT  --  6.8  ALBUMIN  --  3.6  AST  --  17  ALT  --  13  ALKPHOS  --  98  BILITOT  --  0.6   Iron/TIBC/Ferritin/ %Sat No results found for: IRON, TIBC, FERRITIN, IRONPCTSAT    RADIOGRAPHIC STUDIES: I have personally reviewed the radiological images as listed and agreed with the findings in the report. CT RENAL BIOPSY  Result Date: 12/17/2020 INDICATION: Findings worrisome for metastatic renal cell carcinoma. Please perform CT-guided biopsy of exophytic right renal mass for tissue diagnostic purposes Note, prior to proceeding with the CT-guided right renal mass biopsy appropriateness of proceeding with adjacent pathologically enlarged right perirenal lymph node was discussed however the referring oncologist which is for a solely to pursue image guided renal mass biopsy as the patient is unsure certain whether she wishes to pursue treatment and at the present time establishing a definitive tissue diagnosis is of the most significant importance. EXAM: CT-GUIDED RIGHT RENAL MASS BIOPSY COMPARISON:  CT abdomen and  pelvis-10/03/2020; PET-CT-11/04/2020 MEDICATIONS: None. ANESTHESIA/SEDATION: Fentanyl 50 mcg IV; Versed 1 mg IV Sedation time: 15 minutes; The patient was continuously monitored during the procedure by the interventional radiology nurse under my direct supervision. CONTRAST:  None. COMPLICATIONS: None immediate. PROCEDURE: Informed consent was obtained from the patient following an explanation of the procedure, risks, benefits and alternatives.  A time out was performed prior to the initiation of the procedure. The patient was positioned prone on the CT table and a limited CT was performed for procedural planning demonstrating unchanged size and appearance of the approximately 4.7 x 4.2 cm hyperattenuating mass involving the anterior interpolar aspect of the right kidney (image 26, series 2). The procedure was planned. The operative site was prepped and draped in the usual sterile fashion. Appropriate trajectory was confirmed with a 22 gauge spinal needle after the adjacent tissues were anesthetized with 1% Lidocaine with epinephrine. Under intermittent CT guidance, a 17 gauge coaxial needle was advanced into the peripheral aspect of the mass. Appropriate positioning was confirmed and 5 core needle biopsy samples were obtained with an 18 gauge core needle biopsy device. The co-axial needle was removed following administration of a Gel-Foam slurry and superficial hemostasis was achieved with manual compression. A limited postprocedural CT was negative for hemorrhage or additional complication. A dressing was placed. The patient tolerated the procedure well without immediate postprocedural complication. IMPRESSION: Technically successful CT guided core needle biopsy of hyperattenuating mass arising from the interpolar aspect of the right kidney. Electronically Signed   By: Sandi Mariscal M.D.   On: 12/17/2020 13:14      ASSESSMENT & PLAN:  1. 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 Goals of care,  counseling/discussion   3. Lung mass   4. Hypercalcemia   5. Uncontrolled type 2 diabetes mellitus with hyperglycemia ([facility])    #Right kidney mass with retroperitoneal lymphadenopathy, lung mass and peritoneal nodularity. 11/04/2020 PET scan was independently reviewed by me and discussed with patient. 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 12/17/2020, right kidney mass biopsy pathology was reviewed and discussed with patient.  Pathology is consistent with RCC-demonstrates the morphology spectrum, papillary, solid and nested areas.  CD10 positive, racemase positive, CK7 negative.  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  TFE 3 break apart FISH testing is pending. If confirmed, will plan VGEF inhibitor Sunitinib.  If negative. Consider immunotherapy.  Chemotherapy education.  Discussed about the rationale of chemotherapy/immunotherapy and potential side effects. Patient agrees with treatment.  She wants to have her try.  Lung mass maybe a second primary or lung metastatic disease. She is not interested in additional biopsy at this point.   # Hyperglycemia, uncontrolled DM.  She reports that her PCP has increased her DM regimen.  She declines having blood rechecked today.  Goals of care, palliative intent.  Discussed with patient.#  Orders Placed This Encounter  Procedures  . CBC with Differential/Platelet    Standing Status:   Future    Standing Expiration Date:   12/24/2021  . Comprehensive metabolic panel    Standing Status:   Future    Standing Expiration Date:   12/24/2021  . TSH    Standing Status:   Future    Standing Expiration Date:   12/24/2021    All questions were answered. The patient knows to call the clinic with any problems questions or concerns.  Return of visit:  1 week.   [name], MD, PhD Hematology Oncology [facility] Pager- [phone] 12/24/2020

## 2020-12-24 NOTE — Progress Notes (Signed)
Patient here for follow up. Reports that she had a fall 2 weeks ago and now is having knee problems and is unable to use right hand as much. PCP has ordered Xray, but has not been done yet. Pt reports feeling weak.

## 2020-12-25 ENCOUNTER — Telehealth: Payer: Self-pay | Admitting: Oncology

## 2020-12-25 NOTE — Telephone Encounter (Signed)
FYI- pt not coming to chemo edu tomorrow.   uable to reach her after multiple attemps of trying to call her.

## 2020-12-25 NOTE — Patient Instructions (Incomplete)

## 2020-12-26 ENCOUNTER — Inpatient Hospital Stay: Payer: Medicare PPO

## 2020-12-30 NOTE — Telephone Encounter (Addendum)
All appts have been cxl. Attempted to call pt but could not reach her or LVM. Called pts niece, [name], and left VM informing her about cancellation and gave call back number in case there are any questions  SRW

## 2020-12-30 NOTE — Telephone Encounter (Signed)
Please cancel the appts on 4/29.  MD wants to hold off on r/s at this time and will let us know when the additional lab results are in and how to proceed.

## 2021-01-02 ENCOUNTER — Inpatient Hospital Stay: Payer: Medicare PPO

## 2021-01-02 ENCOUNTER — Inpatient Hospital Stay: Payer: Medicare PPO | Admitting: Oncology

## 2021-01-02 ENCOUNTER — Telehealth: Payer: Self-pay

## 2021-01-02 ENCOUNTER — Inpatient Hospital Stay: Payer: Medicare PPO | Admitting: Hospice and Palliative Medicine

## 2021-01-02 NOTE — Telephone Encounter (Signed)
[facility] order form on specimen [id number] (kidney) completed and faxed to pathology.

## 2021-01-05 ENCOUNTER — Encounter: Payer: Self-pay | Admitting: Oncology

## 2021-01-05 LAB — SURGICAL PATHOLOGY

## 2021-01-06 ENCOUNTER — Other Ambulatory Visit: Payer: Self-pay

## 2021-01-06 ENCOUNTER — Other Ambulatory Visit: Payer: Self-pay | Admitting: Oncology

## 2021-01-06 ENCOUNTER — Inpatient Hospital Stay: Payer: Medicare PPO | Attending: Oncology

## 2021-01-06 DIAGNOSIS — C799 Secondary malignant neoplasm of unspecified site: Secondary | ICD-10-CM | POA: Insufficient documentation

## 2021-01-06 DIAGNOSIS — Z79899 Other long term (current) drug therapy: Secondary | ICD-10-CM | POA: Insufficient documentation

## 2021-01-06 DIAGNOSIS — C641 Malignant neoplasm of right kidney, except renal pelvis: Secondary | ICD-10-CM | POA: Insufficient documentation

## 2021-01-06 DIAGNOSIS — Z5112 Encounter for antineoplastic immunotherapy: Secondary | ICD-10-CM | POA: Insufficient documentation

## 2021-01-06 DIAGNOSIS — C649 Malignant neoplasm of unspecified kidney, except renal pelvis: Secondary | ICD-10-CM

## 2021-01-06 NOTE — Progress Notes (Signed)
DISCONTINUE ON PATHWAY REGIMEN - Renal Cell     Administer 4 weeks on followed by 2 weeks off:     Sunitinib   **Always confirm dose/schedule in your pharmacy ordering system**  REASON: Other Reason PRIOR TREATMENT: RCOS32: Sunitinib 50 mg PO Daily x 4 Weeks Followed by 2 Weeks Off TREATMENT RESPONSE: Unable to Evaluate  START ON PATHWAY REGIMEN - Renal Cell     A cycle is every 21 days:     Axitinib      Pembrolizumab   **Always confirm dose/schedule in your pharmacy ordering system**  Patient Characteristics: Stage IV/Metastatic Disease, Clear Cell, First Line, Intermediate or Poor Risk Therapeutic Status: Stage IV/Metastatic Disease Histology: Clear Cell Line of Therapy: First Line Risk Status: Intermediate Risk Intent of Therapy: Non-Curative / Palliative Intent, Discussed with Patient

## 2021-01-08 ENCOUNTER — Inpatient Hospital Stay (HOSPITAL_BASED_OUTPATIENT_CLINIC_OR_DEPARTMENT_OTHER): Payer: Medicare PPO | Admitting: Hospice and Palliative Medicine

## 2021-01-08 ENCOUNTER — Encounter: Payer: Self-pay | Admitting: Oncology

## 2021-01-08 ENCOUNTER — Inpatient Hospital Stay: Payer: Medicare PPO

## 2021-01-08 ENCOUNTER — Inpatient Hospital Stay: Payer: Medicare PPO | Admitting: Oncology

## 2021-01-08 VITALS — BP 146/82 | HR 90 | Temp 96.1°F | Resp 18 | Wt 179.0 lb

## 2021-01-08 DIAGNOSIS — C649 Malignant neoplasm of unspecified kidney, except renal pelvis: Secondary | ICD-10-CM

## 2021-01-08 DIAGNOSIS — Z79899 Other long term (current) drug therapy: Secondary | ICD-10-CM | POA: Diagnosis not present

## 2021-01-08 DIAGNOSIS — C641 Malignant neoplasm of right kidney, except renal pelvis: Secondary | ICD-10-CM

## 2021-01-08 DIAGNOSIS — Z515 Encounter for palliative care: Secondary | ICD-10-CM

## 2021-01-08 DIAGNOSIS — Z5112 Encounter for antineoplastic immunotherapy: Secondary | ICD-10-CM | POA: Diagnosis present

## 2021-01-08 DIAGNOSIS — Z7189 Other specified counseling: Secondary | ICD-10-CM

## 2021-01-08 DIAGNOSIS — E1165 Type 2 diabetes mellitus with hyperglycemia: Secondary | ICD-10-CM | POA: Diagnosis not present

## 2021-01-08 DIAGNOSIS — C799 Secondary malignant neoplasm of unspecified site: Secondary | ICD-10-CM | POA: Diagnosis not present

## 2021-01-08 DIAGNOSIS — Z5111 Encounter for antineoplastic chemotherapy: Secondary | ICD-10-CM | POA: Insufficient documentation

## 2021-01-08 LAB — COMPREHENSIVE METABOLIC PANEL
ALT: 11 U/L (ref 0–44)
AST: 14 U/L — ABNORMAL LOW (ref 15–41)
Albumin: 3.5 g/dL (ref 3.5–5.0)
Alkaline Phosphatase: 94 U/L (ref 38–126)
Anion gap: 8 (ref 5–15)
BUN: 14 mg/dL (ref 8–23)
CO2: 28 mmol/L (ref 22–32)
Calcium: 10.3 mg/dL (ref 8.9–10.3)
Chloride: 103 mmol/L (ref 98–111)
Creatinine, Ser: 0.49 mg/dL (ref 0.44–1.00)
GFR, Estimated: >60 mL/min (ref >60)
Glucose, Bld: 191 mg/dL — ABNORMAL HIGH (ref 70–99)
Potassium: 4 mmol/L (ref 3.5–5.1)
Sodium: 139 mmol/L (ref 135–145)
Total Bilirubin: 0.4 mg/dL (ref 0.3–1.2)
Total Protein: 6.5 g/dL (ref 6.5–8.1)

## 2021-01-08 LAB — CBC WITH DIFFERENTIAL/PLATELET
Abs Immature Granulocytes: 0.05 10*3/uL (ref 0.00–0.07)
Basophils Absolute: 0 10*3/uL (ref 0.0–0.1)
Basophils Relative: 1 %
Eosinophils Absolute: 0.1 10*3/uL (ref 0.0–0.5)
Eosinophils Relative: 2 %
HCT: 38.2 % (ref 36.0–46.0)
Hemoglobin: 12 g/dL (ref 12.0–15.0)
Immature Granulocytes: 1 %
Lymphocytes Relative: 29 %
Lymphs Abs: 1.6 10*3/uL (ref 0.7–4.0)
MCH: 26.8 pg (ref 26.0–34.0)
MCHC: 31.4 g/dL (ref 30.0–36.0)
MCV: 85.5 fL (ref 80.0–100.0)
Monocytes Absolute: 0.6 10*3/uL (ref 0.1–1.0)
Monocytes Relative: 11 %
Neutro Abs: 3.1 10*3/uL (ref 1.7–7.7)
Neutrophils Relative %: 56 %
Platelets: 245 10*3/uL (ref 150–400)
RBC: 4.47 MIL/uL (ref 3.87–5.11)
RDW: 13.4 % (ref 11.5–15.5)
WBC: 5.5 10*3/uL (ref 4.0–10.5)
nRBC: 0 % (ref 0.0–0.2)

## 2021-01-08 LAB — TSH: TSH: 2.952 u[IU]/mL (ref 0.350–4.500)

## 2021-01-08 MED ORDER — SODIUM CHLORIDE 0.9 % IV SOLN
Freq: Once | INTRAVENOUS | Status: AC
  Filled 2021-01-08: qty 250

## 2021-01-08 MED ORDER — SODIUM CHLORIDE 0.9 % IV SOLN
200.0000 mg | Freq: Once | INTRAVENOUS | Status: AC
  Administered 2021-01-08: 200 mg via INTRAVENOUS
  Filled 2021-01-08: qty 8

## 2021-01-08 MED ORDER — FLUCONAZOLE 100 MG PO TABS: 100.0000 mg | ORAL_TABLET | Freq: Every day | ORAL | 0 refills | Status: DC

## 2021-01-08 NOTE — Progress Notes (Signed)
[place]  Telephone:([phone] [phone]   Name: Barbara Frank Date: 01/08/2021 MRN: [id number]  DOB: [birth year]  Patient Care Team: Sheral Apley as PCP - General    REASON FOR CONSULTATION: Barbara Frank is a 75 y.o. female with multiple medical problems including A. fib on Eliquis, depression, and diabetes, who was admitted in October 2021 in [facility] for failure to thrive.  Work-up including abdominal CT chest/abdomen/pelvis revealed a right renal mass and retroperitoneal adenopathy concerning for renal cell carcinoma.  Patient saw Dr. Tasia Catchings and was not interested in pursuing aggressive diagnostic work-up or treatment.  Patient later agreed to systemic treatment. She was referred to palliative care to discuss goals.  SOCIAL HISTORY:     reports that she has never smoked. She has never used smokeless tobacco. She reports previous alcohol use. She reports previous drug use.  Patient was previously living near [facility] in a motel. Patient now lives in [facility].  She is widowed 3 years ago.  She has no children.  Her only living family is her husband's niece.  Patient is a retired Air cabin crew.  ADVANCE DIRECTIVES:  Patient's HC POA husband's niece, Barbara Frank  CODE STATUS:   PAST MEDICAL HISTORY: Past Medical History:  Diagnosis Date  . A-fib ([place])   . Depression   . Diabetes mellitus without complication ([place])   . History of kidney stones   . Kidney mass     PAST SURGICAL HISTORY:  Past Surgical History:  Procedure Laterality Date  . GASTRIC BYPASS    . HERNIA REPAIR    . TONSILLECTOMY    . URETEROSCOPY WITH HOLMIUM LASER LITHOTRIPSY Left     HEMATOLOGY/ONCOLOGY HISTORY:  Oncology History  Renal cell carcinoma ([place])  09/26/2020 Initial Diagnosis   Renal cell carcinoma ([place])   12/24/2020 Cancer Staging   Staging form: Kidney, AJCC 8th Edition - Clinical  stage from 12/24/2020: Stage IV (cT1b, cN1, cM1) - Signed by [name], MD on 12/24/2020   01/08/2021 -  Chemotherapy    Patient is on Treatment Plan: RENAL CELL PEMBROLIZUMAB         ALLERGIES:  is allergic to kiwi extract.  MEDICATIONS:  Current Outpatient Medications  Medication Sig Dispense Refill  . acetaminophen (TYLENOL) 650 MG CR tablet Take 650 mg by mouth every 4 (four) hours as needed for pain or fever.    . cetirizine (ZYRTEC) 10 MG tablet Take 10 mg by mouth daily.    . cyanocobalamin (,VITAMIN B-12,) 1000 MCG/ML injection Inject into the muscle.    Marland Kitchen ELIQUIS 5 MG TABS tablet Take 5 mg by mouth 2 (two) times daily.    . fluconazole (DIFLUCAN) 100 MG tablet Take 1 tablet (100 mg total) by mouth daily. 5 tablet 0  . insulin glargine (LANTUS) 100 UNIT/ML injection Inject 27 Units into the skin 2 (two) times daily. 27 u in the morning and 27 u at night    . metFORMIN (GLUCOPHAGE) 850 MG tablet Take 1,000 mg by mouth 2 (two) times daily with a meal.    . metoprolol succinate (TOPROL-XL) 50 MG 24 hr tablet Take 50 mg by mouth daily. Take with or immediately following a meal.    . Psyllium 48.57 % POWD Take by mouth. Takes 1 QD prn     No current facility-administered medications for this visit.    VITAL SIGNS: There were no vitals taken for this visit.  There were no vitals filed for this visit.  Estimated body mass index is 32.22 kg/m as calculated from the following:   Height as of 12/17/20: 5' 2.5" (1.588 m).   Weight as of an earlier encounter on 01/08/21: 179 lb (81.2 kg).  LABS: CBC:    Component Value Date/Time   WBC 5.5 01/08/2021 0822   HGB 12.0 01/08/2021 0822   HCT 38.2 01/08/2021 0822   PLT 245 01/08/2021 0822   MCV 85.5 01/08/2021 0822   NEUTROABS 3.1 01/08/2021 0822   LYMPHSABS 1.6 01/08/2021 0822   MONOABS 0.6 01/08/2021 0822   EOSABS 0.1 01/08/2021 0822   BASOSABS 0.0 01/08/2021 0822   Comprehensive Metabolic Panel:    Component Value Date/Time   NA  139 01/08/2021 0822   K 4.0 01/08/2021 0822   CL 103 01/08/2021 0822   CO2 28 01/08/2021 0822   BUN 14 01/08/2021 0822   CREATININE 0.49 01/08/2021 0822   GLUCOSE 191 (H) 01/08/2021 0822   CALCIUM 10.3 01/08/2021 0822   AST 14 (L) 01/08/2021 0822   ALT 11 01/08/2021 0822   ALKPHOS 94 01/08/2021 0822   BILITOT 0.4 01/08/2021 0822   PROT 6.5 01/08/2021 0822   ALBUMIN 3.5 01/08/2021 7371    RADIOGRAPHIC STUDIES: CT RENAL BIOPSY  Result Date: 12/17/2020 INDICATION: Findings worrisome for metastatic renal cell carcinoma. Please perform CT-guided biopsy of exophytic right renal mass for tissue diagnostic purposes Note, prior to proceeding with the CT-guided right renal mass biopsy appropriateness of proceeding with adjacent pathologically enlarged right perirenal lymph node was discussed however the referring oncologist which is for a solely to pursue image guided renal mass biopsy as the patient is unsure certain whether she wishes to pursue treatment and at the present time establishing a definitive tissue diagnosis is of the most significant importance. EXAM: CT-GUIDED RIGHT RENAL MASS BIOPSY COMPARISON:  CT abdomen and pelvis-10/03/2020; PET-CT-11/04/2020 MEDICATIONS: None. ANESTHESIA/SEDATION: Fentanyl 50 mcg IV; Versed 1 mg IV Sedation time: 15 minutes; The patient was continuously monitored during the procedure by the interventional radiology nurse under my direct supervision. CONTRAST:  None. COMPLICATIONS: None immediate. PROCEDURE: Informed consent was obtained from the patient following an explanation of the procedure, risks, benefits and alternatives. A time out was performed prior to the initiation of the procedure. The patient was positioned prone on the CT table and a limited CT was performed for procedural planning demonstrating unchanged size and appearance of the approximately 4.7 x 4.2 cm hyperattenuating mass involving the anterior interpolar aspect of the right kidney (image 26,  series 2). The procedure was planned. The operative site was prepped and draped in the usual sterile fashion. Appropriate trajectory was confirmed with a 22 gauge spinal needle after the adjacent tissues were anesthetized with 1% Lidocaine with epinephrine. Under intermittent CT guidance, a 17 gauge coaxial needle was advanced into the peripheral aspect of the mass. Appropriate positioning was confirmed and 5 core needle biopsy samples were obtained with an 18 gauge core needle biopsy device. The co-axial needle was removed following administration of a Gel-Foam slurry and superficial hemostasis was achieved with manual compression. A limited postprocedural CT was negative for hemorrhage or additional complication. A dressing was placed. The patient tolerated the procedure well without immediate postprocedural complication. IMPRESSION: Technically successful CT guided core needle biopsy of hyperattenuating mass arising from the interpolar aspect of the right kidney. Electronically Signed   By: Sandi Mariscal M.D.   On: 12/17/2020 13:14    PERFORMANCE STATUS (ECOG) :  2 - Symptomatic, <50% confined to bed  Review of Systems Unless otherwise noted, a complete review of systems is negative.  Physical Exam General: NAD, thin, frail-appearing Pulmonary: unlabored Extremities: no edema, no joint deformities Skin: no rashes Neurological: Weakness but otherwise nonfocal  IMPRESSION: Patient presents today for routine follow-up.  Patient seen in the infusion area.  Patient denies any significant changes or concerns today.  She continues to endorse anxiety and depressive symptoms.  She says that she feels betrayed by her PCP who ordered CBG checks twice daily by staff.  She was unable to explain why she felt this was a betrayal.  However, it appears that a Education officer, museum was also sent out to the house and she verbalized a longstanding distrust of social work.  Patient remains reluctant to take any medications  for her moods or seek care from psychiatry.  She endorses loneliness.  She says that she really needs a "friend."  We discussed strategies for her to increase social engagement.  She says she is thinking about trying a class at the community college.  PLAN: -Continue current scope of treatment -Patient to bring in ACP documents -MOST form reviewed -Follow-up MyChart visit 2 months   Patient expressed understanding and was in agreement with this plan. She also understands that She can call the clinic at any time with any questions, concerns, or complaints.     Time Total: 20 minutes  Visit consisted of counseling and education dealing with the complex and emotionally intense issues of symptom management and palliative care in the setting of serious and potentially life-threatening illness.Greater than 50%  of this time was spent counseling and coordinating care related to the above assessment and plan.  Signed by: Altha Harm, PhD, NP-C

## 2021-01-08 NOTE — Progress Notes (Signed)
Hematology/Oncology Consult note [facility] Telephone:([phone] Fax:([phone]   Patient Care Team: Sheral Apley as PCP - General  REFERRING PROVIDER: No ref. provider found  CHIEF COMPLAINTS/REASON FOR VISIT:  RCC  HISTORY OF PRESENTING ILLNESS:   Barbara Frank is a  75 y.o.  female with PMH listed below was seen in consultation at the request of  No ref. provider found  for evaluation of right kidney mass.   Patient is a poor historian. Her mood appears to be depressed.  She currently lives at assisted living facility. Moved from [place]. Widowed 3 years ago.  Her power of attorney is [name] who is her husband's niece. [name] was called during this encounter.   Extensive medical record review was performed. She has had multiple images done during the past few months, 05/27/20 CT head wo contrast- no acute intracranial abnormality.  06/01/20 CT abdomen and pelvis with contrast 4 mm proximal left ureteral calculus causing mild proximal left- sided hydroureteronephrosis with periureteric and perinephric stranding on the left.5.2cm enhancing right mid anterior kidney mass. Mildly enlarged right retroperitoneal retrocaval lymph nodes suspicious for nodal metastases Mild cardiomegaly, Small hiatal hernia. Status post gastric bypass surgery and cholecystectomy. Postoperative changes anterior abdominal wall. 06/05/20 CT head wo contrast - No acute intracranial abnormality. Probable tiny right frontal scalp Contusion. 2. Mild global volume loss and minimal chronic ischemic small vessel disease. 06/06/20 CT abdomen pelvis wo contrast 1. Indeterminate lesion in the right kidney which is stable may be a solid mass., 2. Partially calcified uterine fibroids. 3. Left UVJ calculus measuring 5 mm 06/07/20 CT head wo contrast.  1. No acute intracranial abnormality. No significant change. 2. Intracranial atherosclerosis, atrophy and chronic small vessel ischemic  changes. 3. Chronic/subacute right maxillary sinus disease.  06/07/20 Cystoscopy by [name] for Ureteroscopic stone extraction Admitted on 10/3/21due to failure to thrive,  anorexia, dehydration, electrolyte imbalances.   06/09/20 CT abdomen and chest with contrast 1. Right renal mass is stable in size. Most suggestive of a renal neoplasm. 2. Right retroperitoneal lymph nodes are stable in size; may well be metastatic. 3. Left hydronephrosis and hydroureter is stable. Pelvis is not imaged therefore if there is a distal calculus still present, it is not visible. 4. Sclerotic lesions in the anterior left seventh, eighth and ninth ribs which given distribution are most likely healed rib fractures.  She is on Eliquis 5mg  Bid for atrial fibrillation. She reports not taking anti depressants that were given by her PCP.  #11/04/2020, PET scan was obtained. PET scan showed interval increasing size of right upper lobe pulmonary lesion, now 3 cm with an SUV of 3.8.  Associated with a new right lower lobe nodule 8 mm.  5.1 cm right renal mass hypermetabolic activity.  1.9 retrocaval lymph node with SUV of 4.8.  Second index retrocaval node with SUV of 2.  Nodular disease seen in the duodenum on previous study is not well demonstrated on noncontrast CT image today.  Some nodular FDG activity, nonspecific. Multiple anterior abdominal soft tissue nodules are evident.  13 mm anterior left nodule shows no substantial hypermetabolic with an SUV of 1.7.  Calcified left lateral nodule with an SUV of 2.9.  2.2 cm calcified nodule lobe right paracolic gutter with an SUV of 2.5.  12/17/2020, right kidney mass biopsy pathology was reviewed and discussed with patient.  Pathology is consistent with RCC-demonstrates the morphology spectrum, papillary, solid and nested areas.  CD10 positive, racemase positive, CK7 negative.  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  TFE 3 break apart FISH  testing is pending.  #01/05/2021, addendum pathology TFE3 break-apart FISH testing was performed and a translocation  involving Xp11 / TFE3 transcription gene was not detected. HMB-45 IHC is negative  CAIX IHC demonstrates membranous staining.  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 female who has above history reviewed by me today presents for follow up visit for management of right renal clear-cell carcinoma  Problems and complaints are listed below: Patient presents for starting immunotherapy. Chronic fatigue unchanged. She reports " I am stressed by my primary care provider Steffanie Dunn". "  She pressures me to do so many things"."  I am in the process of finding another provider" Denies any pain today.   Review of Systems  Constitutional: Positive for fatigue. Negative for appetite change, chills and fever.  HENT:   Negative for hearing loss and voice change.   Eyes: Negative for eye problems.  Respiratory: Negative for chest tightness and cough.   Cardiovascular: Negative for chest pain.  Gastrointestinal: Positive for constipation. Negative for abdominal distention, abdominal pain and blood in stool.  Endocrine: Negative for hot flashes.  Genitourinary: Negative for difficulty urinating and frequency.   Musculoskeletal: Negative for arthralgias.  Skin: Negative for itching and rash.  Neurological: Negative for extremity weakness.  Hematological: Negative for adenopathy.  Psychiatric/Behavioral: Positive for depression. Negative for confusion.    MEDICAL HISTORY:  Past Medical History:  Diagnosis Date  . A-fib ([place])   . Depression   . Diabetes mellitus without complication ([place])   . History of kidney stones   . Kidney mass     SURGICAL HISTORY: Past Surgical History:  Procedure Laterality Date  . GASTRIC BYPASS    . HERNIA REPAIR    .  TONSILLECTOMY    . URETEROSCOPY WITH HOLMIUM LASER LITHOTRIPSY Left     SOCIAL HISTORY: Social History   Socioeconomic History  . Marital status: Widowed    Spouse name: Not on file  . Number of children: Not on file  . Years of education: Not on file  . Highest education level: Not on file  Occupational History  . Not on file  Tobacco Use  . Smoking status: Never Smoker  . Smokeless tobacco: Never Used  Vaping Use  . Vaping Use: Never used  Substance and Sexual Activity  . Alcohol use: Not Currently  . Drug use: Not Currently  . Sexual activity: Not Currently  Other Topics Concern  . Not on file  Social History Narrative  . Not on file   Social Determinants of Health   Financial Resource Strain: Not on file  Food Insecurity: Not on file  Transportation Needs: Not on file  Physical Activity: Not on file  Stress: Not on file  Social Connections: Not on file  Intimate Partner Violence: Not on file    FAMILY HISTORY: Family History  Problem Relation Age of Onset  . Breast cancer Mother   . Bladder Cancer Neg Hx   . Kidney cancer Neg Hx   . Prostate cancer Neg Hx     ALLERGIES:  is allergic to kiwi extract.  MEDICATIONS:  Current Outpatient Medications  Medication Sig Dispense Refill  . acetaminophen (TYLENOL) 650 MG CR tablet Take 650 mg by mouth every 4 (four) hours as needed for pain or  fever.    . cetirizine (ZYRTEC) 10 MG tablet Take 10 mg by mouth daily.    . cyanocobalamin (,VITAMIN B-12,) 1000 MCG/ML injection Inject into the muscle.    Marland Kitchen ELIQUIS 5 MG TABS tablet Take 5 mg by mouth 2 (two) times daily.    . fluconazole (DIFLUCAN) 100 MG tablet Take 1 tablet (100 mg total) by mouth daily. 5 tablet 0  . insulin glargine (LANTUS) 100 UNIT/ML injection Inject 27 Units into the skin 2 (two) times daily. 27 u in the morning and 27 u at night    . metFORMIN (GLUCOPHAGE) 850 MG tablet Take 1,000 mg by mouth 2 (two) times daily with a meal.    . metoprolol  succinate (TOPROL-XL) 50 MG 24 hr tablet Take 50 mg by mouth daily. Take with or immediately following a meal.    . Psyllium 48.57 % POWD Take by mouth. Takes 1 QD prn     No current facility-administered medications for this visit.     PHYSICAL EXAMINATION: ECOG PERFORMANCE STATUS: 2 - Symptomatic, <50% confined to bed Vitals:   01/08/21 0934  BP: (!) 146/82  Pulse: 90  Resp: 18  Temp: (!) 96.1 F (35.6 C)   Filed Weights   01/08/21 0934  Weight: 179 lb (81.2 kg)    Physical Exam Constitutional:      General: She is not in acute distress.    Comments: Frail elderly female walks with a walker.   HENT:     Head: Normocephalic and atraumatic.  Eyes:     General: No scleral icterus. Cardiovascular:     Rate and Rhythm: Normal rate. Rhythm irregular.     Heart sounds: Normal heart sounds.  Pulmonary:     Effort: Pulmonary effort is normal. No respiratory distress.     Breath sounds: No wheezing.  Abdominal:     General: Bowel sounds are normal. There is no distension.     Palpations: Abdomen is soft.  Musculoskeletal:        General: No deformity. Normal range of motion.     Cervical back: Normal range of motion and neck supple.  Skin:    General: Skin is warm and dry.     Findings: No erythema or rash.  Neurological:     Mental Status: She is alert. Mental status is at baseline.     Cranial Nerves: No cranial nerve deficit.     Coordination: Coordination normal.     LABORATORY DATA:  I have reviewed the data as listed Lab Results  Component Value Date   WBC 5.5 01/08/2021   HGB 12.0 01/08/2021   HCT 38.2 01/08/2021   MCV 85.5 01/08/2021   PLT 245 01/08/2021   Recent Labs    06/20/20 2005 09/26/20 1243 01/08/21 0822  NA 134* 133* 139  K 4.6 4.5 4.0  CL 98 99 103  CO2 26 27 28   GLUCOSE 303* 426* 191*  BUN 12 16 14   CREATININE 0.58 0.70 0.49  CALCIUM 10.2 10.8* 10.3  GFRNONAA >60 >60 >60  PROT  --  6.8 6.5  ALBUMIN  --  3.6 3.5  AST  --  17 14*   ALT  --  13 11  ALKPHOS  --  98 94  BILITOT  --  0.6 0.4   Iron/TIBC/Ferritin/ %Sat No results found for: IRON, TIBC, FERRITIN, IRONPCTSAT    RADIOGRAPHIC STUDIES: I have personally reviewed the radiological images as listed and agreed with the findings in the report.  CT RENAL BIOPSY  Result Date: 12/17/2020 INDICATION: Findings worrisome for metastatic renal cell carcinoma. Please perform CT-guided biopsy of exophytic right renal mass for tissue diagnostic purposes Note, prior to proceeding with the CT-guided right renal mass biopsy appropriateness of proceeding with adjacent pathologically enlarged right perirenal lymph node was discussed however the referring oncologist which is for a solely to pursue image guided renal mass biopsy as the patient is unsure certain whether she wishes to pursue treatment and at the present time establishing a definitive tissue diagnosis is of the most significant importance. EXAM: CT-GUIDED RIGHT RENAL MASS BIOPSY COMPARISON:  CT abdomen and pelvis-10/03/2020; PET-CT-11/04/2020 MEDICATIONS: None. ANESTHESIA/SEDATION: Fentanyl 50 mcg IV; Versed 1 mg IV Sedation time: 15 minutes; The patient was continuously monitored during the procedure by the interventional radiology nurse under my direct supervision. CONTRAST:  None. COMPLICATIONS: None immediate. PROCEDURE: Informed consent was obtained from the patient following an explanation of the procedure, risks, benefits and alternatives. A time out was performed prior to the initiation of the procedure. The patient was positioned prone on the CT table and a limited CT was performed for procedural planning demonstrating unchanged size and appearance of the approximately 4.7 x 4.2 cm hyperattenuating mass involving the anterior interpolar aspect of the right kidney (image 26, series 2). The procedure was planned. The operative site was prepped and draped in the usual sterile fashion. Appropriate trajectory was confirmed with  a 22 gauge spinal needle after the adjacent tissues were anesthetized with 1% Lidocaine with epinephrine. Under intermittent CT guidance, a 17 gauge coaxial needle was advanced into the peripheral aspect of the mass. Appropriate positioning was confirmed and 5 core needle biopsy samples were obtained with an 18 gauge core needle biopsy device. The co-axial needle was removed following administration of a Gel-Foam slurry and superficial hemostasis was achieved with manual compression. A limited postprocedural CT was negative for hemorrhage or additional complication. A dressing was placed. The patient tolerated the procedure well without immediate postprocedural complication. IMPRESSION: Technically successful CT guided core needle biopsy of hyperattenuating mass arising from the interpolar aspect of the right kidney. Electronically Signed   By: Sandi Mariscal M.D.   On: 12/17/2020 13:14      ASSESSMENT & PLAN:  1. Renal cell carcinoma of right kidney ([place])   2. Goals of care, counseling/discussion   3. Encounter for antineoplastic chemotherapy   4. Uncontrolled type 2 diabetes mellitus with hyperglycemia ([facility])    #Right kidney mass with retroperitoneal lymphadenopathy, lung mass and peritoneal nodularity. 11/04/2020 PET scan was independently reviewed by me and discussed with patient. 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 I discussed with Dr. Reuel Derby about the pathology addendum.  Patient has clear cell carcinoma I recommend immunotherapy with Keytruda, in the future if she tolerates, plan to add axitinib. Rationale and potential side effects were discussed with patient and she agrees with the plan. Labs reviewed and discussed with patient.  Proceed with cycle 1 Keytruda  Lung mass maybe a second primary or lung metastatic disease. She is not interested in additional biopsy at this point.   # Hyperglycemia, uncontrolled DM.  Blood glucose has improved  today to 191, improving.  Continue metformin I encourage patient to continue follow-up with primary care provider. #Depression, she does not want to take any antidepressants. Follow-up with palliative care service. All questions were answered. The patient knows to call the clinic with any problems questions or concerns.  Return of visit:  3 weeks.   [name], MD, PhD Hematology Oncology [facility] at [facility] Pager- [phone] 01/08/2021

## 2021-01-08 NOTE — Patient Instructions (Signed)
CANCER CENTER [facility] [facility] [facility]  Discharge Instructions: Thank you for choosing [facility] to provide your oncology and hematology care.  If you have a lab appointment with the Cancer Center, please go directly to the Cancer Center and check in at the registration area.  Wear comfortable clothing and clothing appropriate for easy access to any Portacath or PICC line.   We strive to give you quality time with your provider. You may need to reschedule your appointment if you arrive late (15 or more minutes).  Arriving late affects you and other patients whose appointments are after yours.  Also, if you miss three or more appointments without notifying the office, you may be dismissed from the clinic at the provider's discretion.      For prescription refill requests, have your pharmacy contact our office and allow 72 hours for refills to be completed.    Today you received the following chemotherapy and/or immunotherapy agents - pembrolizumab      To help prevent nausea and vomiting after your treatment, we encourage you to take your nausea medication as directed.  BELOW ARE SYMPTOMS THAT SHOULD BE REPORTED IMMEDIATELY: . *FEVER GREATER THAN 100.4 F (38 C) OR HIGHER . *CHILLS OR SWEATING . *NAUSEA AND VOMITING THAT IS NOT CONTROLLED WITH YOUR NAUSEA MEDICATION . *UNUSUAL SHORTNESS OF BREATH . *UNUSUAL BRUISING OR BLEEDING . *URINARY PROBLEMS (pain or burning when urinating, or frequent urination) . *BOWEL PROBLEMS (unusual diarrhea, constipation, pain near the anus) . TENDERNESS IN MOUTH AND THROAT WITH OR WITHOUT PRESENCE OF ULCERS (sore throat, sores in mouth, or a toothache) . UNUSUAL RASH, SWELLING OR PAIN  . UNUSUAL VAGINAL DISCHARGE OR ITCHING   Items with * indicate a potential emergency and should be followed up as soon as possible or go to the Emergency Department if any problems should occur.  Please show the CHEMOTHERAPY ALERT CARD or  IMMUNOTHERAPY ALERT CARD at check-in to the Emergency Department and triage nurse.  Should you have questions after your visit or need to cancel or reschedule your appointment, please contact CANCER CENTER [place] [facility]  [phone] and follow the prompts.  Office hours are 8:00 a.m. to 4:30 p.m. Monday - Friday. Please note that voicemails left after 4:00 p.m. may not be returned until the following business day.  We are closed weekends and major holidays. You have access to a nurse at all times for urgent questions. Please call the main number to the clinic [phone] and follow the prompts.  For any non-urgent questions, you may also contact your provider using MyChart. We now offer e-Visits for anyone 18 and older to request care online for non-urgent symptoms. For details visit [email or web address].   Also download the MyChart app! Go to the app store, search "MyChart", open the app, select [facility], and log in with your MyChart username and password.  Due to Covid, a mask is required upon entering the hospital/clinic. If you do not have a mask, one will be given to you upon arrival. For doctor visits, patients may have 1 support person aged 18 or older with them. For treatment visits, patients cannot have anyone with them due to current Covid guidelines and our immunocompromised population.   Pembrolizumab injection What is this medicine? PEMBROLIZUMAB (pem broe liz ue mab) is a monoclonal antibody. It is used to treat certain types of cancer. This medicine may be used for other purposes; ask your health care provider or pharmacist if you have questions.   COMMON BRAND NAME(S): Keytruda What should I tell my health care provider before I take this medicine? They need to know if you have any of these conditions:  autoimmune diseases like Crohn's disease, ulcerative colitis, or lupus  have had or planning to have an allogeneic stem cell transplant (uses someone  else's stem cells)  history of organ transplant  history of chest radiation  nervous system problems like myasthenia gravis or Guillain-Barre syndrome  an unusual or allergic reaction to pembrolizumab, other medicines, foods, dyes, or preservatives  pregnant or trying to get pregnant  breast-feeding How should I use this medicine? This medicine is for infusion into a vein. It is given by a health care professional in a hospital or clinic setting. A special MedGuide will be given to you before each treatment. Be sure to read this information carefully each time. Talk to your pediatrician regarding the use of this medicine in children. While this drug may be prescribed for children as young as 6 months for selected conditions, precautions do apply. Overdosage: If you think you have taken too much of this medicine contact a poison control center or emergency room at once. NOTE: This medicine is only for you. Do not share this medicine with others. What if I miss a dose? It is important not to miss your dose. Call your doctor or health care professional if you are unable to keep an appointment. What may interact with this medicine? Interactions have not been studied. This list may not describe all possible interactions. Give your health care provider a list of all the medicines, herbs, non-prescription drugs, or dietary supplements you use. Also tell them if you smoke, drink alcohol, or use illegal drugs. Some items may interact with your medicine. What should I watch for while using this medicine? Your condition will be monitored carefully while you are receiving this medicine. You may need blood work done while you are taking this medicine. Do not become pregnant while taking this medicine or for 4 months after stopping it. Women should inform their doctor if they wish to become pregnant or think they might be pregnant. There is a potential for serious side effects to an unborn child. Talk  to your health care professional or pharmacist for more information. Do not breast-feed an infant while taking this medicine or for 4 months after the last dose. What side effects may I notice from receiving this medicine? Side effects that you should report to your doctor or health care professional as soon as possible:  allergic reactions like skin rash, itching or hives, swelling of the face, lips, or tongue  bloody or black, tarry  breathing problems  changes in vision  chest pain  chills  confusion  constipation  cough  diarrhea  dizziness or feeling faint or lightheaded  fast or irregular heartbeat  fever  flushing  joint pain  low blood counts - this medicine may decrease the number of white blood cells, red blood cells and platelets. You may be at increased risk for infections and bleeding.  muscle pain  muscle weakness  pain, tingling, numbness in the hands or feet  persistent headache  redness, blistering, peeling or loosening of the skin, including inside the mouth  signs and symptoms of high blood sugar such as dizziness; dry mouth; dry skin; fruity breath; nausea; stomach pain; increased hunger or thirst; increased urination  signs and symptoms of kidney injury like trouble passing urine or change in the amount of urine  signs   and symptoms of liver injury like dark urine, light-colored stools, loss of appetite, nausea, right upper belly pain, yellowing of the eyes or skin  sweating  swollen lymph nodes  weight loss Side effects that usually do not require medical attention (report to your doctor or health care professional if they continue or are bothersome):  decreased appetite  hair loss  tiredness This list may not describe all possible side effects. Call your doctor for medical advice about side effects. You may report side effects to FDA at 1-800-FDA-1088. Where should I keep my medicine? This drug is given in a hospital or clinic  and will not be stored at home. NOTE: This sheet is a summary. It may not cover all possible information. If you have questions about this medicine, talk to your doctor, pharmacist, or health care provider.  2021 Elsevier/Gold Standard (2019-07-25 21:44:53)  

## 2021-01-08 NOTE — Progress Notes (Signed)
Patient here for new treatment start.

## 2021-01-09 LAB — T4: T4, Total: 6.5 ug/dL (ref 4.5–12.0)

## 2021-01-12 ENCOUNTER — Telehealth: Payer: Self-pay

## 2021-01-12 NOTE — Telephone Encounter (Signed)
T/C to patient for follow up after first infusion.  No answer and mail box has not been set up to leave a message.

## 2021-01-19 ENCOUNTER — Encounter: Payer: Self-pay | Admitting: Oncology

## 2021-01-19 NOTE — Telephone Encounter (Signed)
Results received

## 2021-01-20 ENCOUNTER — Encounter: Payer: Self-pay | Admitting: Oncology

## 2021-01-27 ENCOUNTER — Telehealth: Payer: Self-pay | Admitting: *Deleted

## 2021-01-27 NOTE — Telephone Encounter (Signed)
Patient called stating that she tested negative for the Virus, but the man she eats breakfast and lunch with daily has tested positive. She wants [name] to know this

## 2021-01-27 NOTE — Telephone Encounter (Signed)
Patient has an appt on 01/30/21 for lab/MD/Keytruda.

## 2021-01-30 ENCOUNTER — Encounter: Payer: Self-pay | Admitting: Oncology

## 2021-01-30 ENCOUNTER — Inpatient Hospital Stay: Payer: Medicare PPO

## 2021-01-30 ENCOUNTER — Telehealth: Payer: Self-pay | Admitting: Pharmacy Technician

## 2021-01-30 ENCOUNTER — Telehealth: Payer: Self-pay | Admitting: Pharmacist

## 2021-01-30 ENCOUNTER — Other Ambulatory Visit (HOSPITAL_COMMUNITY): Payer: Self-pay

## 2021-01-30 ENCOUNTER — Inpatient Hospital Stay: Payer: Medicare PPO | Admitting: Oncology

## 2021-01-30 VITALS — BP 134/86 | HR 108 | Temp 96.7°F | Resp 16 | Wt 181.0 lb

## 2021-01-30 VITALS — BP 143/92 | HR 92 | Resp 16

## 2021-01-30 DIAGNOSIS — C641 Malignant neoplasm of right kidney, except renal pelvis: Secondary | ICD-10-CM

## 2021-01-30 DIAGNOSIS — Z5112 Encounter for antineoplastic immunotherapy: Secondary | ICD-10-CM

## 2021-01-30 DIAGNOSIS — E1165 Type 2 diabetes mellitus with hyperglycemia: Secondary | ICD-10-CM | POA: Diagnosis not present

## 2021-01-30 DIAGNOSIS — C649 Malignant neoplasm of unspecified kidney, except renal pelvis: Secondary | ICD-10-CM

## 2021-01-30 LAB — COMPREHENSIVE METABOLIC PANEL
ALT: 12 U/L (ref 0–44)
AST: 16 U/L (ref 15–41)
Albumin: 3.8 g/dL (ref 3.5–5.0)
Alkaline Phosphatase: 77 U/L (ref 38–126)
Anion gap: 10 (ref 5–15)
BUN: 11 mg/dL (ref 8–23)
CO2: 28 mmol/L (ref 22–32)
Calcium: 10.6 mg/dL — ABNORMAL HIGH (ref 8.9–10.3)
Chloride: 102 mmol/L (ref 98–111)
Creatinine, Ser: 0.39 mg/dL — ABNORMAL LOW (ref 0.44–1.00)
GFR, Estimated: >60 mL/min (ref >60)
Glucose, Bld: 118 mg/dL — ABNORMAL HIGH (ref 70–99)
Potassium: 4.2 mmol/L (ref 3.5–5.1)
Sodium: 140 mmol/L (ref 135–145)
Total Bilirubin: 0.6 mg/dL (ref 0.3–1.2)
Total Protein: 6.7 g/dL (ref 6.5–8.1)

## 2021-01-30 LAB — CBC WITH DIFFERENTIAL/PLATELET
Abs Immature Granulocytes: 0.04 10*3/uL (ref 0.00–0.07)
Basophils Absolute: 0 10*3/uL (ref 0.0–0.1)
Basophils Relative: 1 %
Eosinophils Absolute: 0.2 10*3/uL (ref 0.0–0.5)
Eosinophils Relative: 2 %
HCT: 40.9 % (ref 36.0–46.0)
Hemoglobin: 13.1 g/dL (ref 12.0–15.0)
Immature Granulocytes: 1 %
Lymphocytes Relative: 22 %
Lymphs Abs: 1.5 10*3/uL (ref 0.7–4.0)
MCH: 26.9 pg (ref 26.0–34.0)
MCHC: 32 g/dL (ref 30.0–36.0)
MCV: 84 fL (ref 80.0–100.0)
Monocytes Absolute: 0.5 10*3/uL (ref 0.1–1.0)
Monocytes Relative: 7 %
Neutro Abs: 4.7 10*3/uL (ref 1.7–7.7)
Neutrophils Relative %: 67 %
Platelets: 242 10*3/uL (ref 150–400)
RBC: 4.87 MIL/uL (ref 3.87–5.11)
RDW: 13.6 % (ref 11.5–15.5)
WBC: 6.9 10*3/uL (ref 4.0–10.5)
nRBC: 0 % (ref 0.0–0.2)

## 2021-01-30 MED ORDER — AXITINIB 5 MG PO TABS
5.0000 mg | ORAL_TABLET | Freq: Two times a day (BID) | ORAL | 0 refills | Status: DC
  Filled 2021-01-30 – 2021-02-12 (×2): qty 60, 30d supply, fill #0

## 2021-01-30 MED ORDER — SODIUM CHLORIDE 0.9 % IV SOLN
Freq: Once | INTRAVENOUS | Status: AC
  Filled 2021-01-30: qty 250

## 2021-01-30 MED ORDER — SODIUM CHLORIDE 0.9 % IV SOLN
200.0000 mg | Freq: Once | INTRAVENOUS | Status: AC
  Administered 2021-01-30: 200 mg via INTRAVENOUS
  Filled 2021-01-30: qty 8

## 2021-01-30 NOTE — Telephone Encounter (Signed)
Oral Oncology Patient Advocate Encounter  Prior Authorization for [name] has been approved.    PA# [id number] Effective dates: 01/30/21 through 07/29/21   Patients co-pay is $100.00  Oral Oncology Clinic will continue to follow.   [place] Patient [place] Phone [phone] Fax [phone] 01/30/2021 11:25 AM

## 2021-01-30 NOTE — Progress Notes (Signed)
Patient reports increased urinary frequency during the night for a couple of months.  Her rate is irregular today (hx of A-fib) ranging from 108-118.

## 2021-01-30 NOTE — Telephone Encounter (Signed)
Oral Oncology Patient Advocate Encounter  Received notification from [facility] that prior authorization for Inlyta is required.  PA submitted on CoverMyMeds Key [id number]  Status is pending  Oral Oncology Clinic will continue to follow.  [place] Patient [place] Phone [phone] Fax [phone] 01/30/2021 11:05 AM

## 2021-01-30 NOTE — Progress Notes (Signed)
Per Esau Grew CMA, patient okay to proceed with treatment with additional IVF.

## 2021-01-30 NOTE — Telephone Encounter (Signed)
Oral Oncology Patient Advocate Encounter  Was successful in securing patient a $10,000 grant from [employer] to provide copayment coverage for Inlyta.  This will keep the out of pocket expense at $0.     [employer] ID: [id number]  I have spoken with the patient.   The billing information is as follows and has been shared with [place].    RxBin: [id number] PCN: [id number] Member ID: [id number] Group ID: [id number] Dates of Eligibility: 12/31/20 through 12/30/21  Fund:  Renal Cell [place] Patient [place] Phone [phone] Fax [phone] 01/30/2021 11:57 AM

## 2021-01-30 NOTE — Patient Instructions (Signed)
CANCER CENTER [place] [facility]  Discharge Instructions: Thank you for choosing [facility] to provide your oncology and hematology care.  If you have a lab appointment with the Cancer Center, please go directly to the Cancer Center and check in at the registration area.  Wear comfortable clothing and clothing appropriate for easy access to any Portacath or PICC line.   We strive to give you quality time with your provider. You may need to reschedule your appointment if you arrive late (15 or more minutes).  Arriving late affects you and other patients whose appointments are after yours.  Also, if you miss three or more appointments without notifying the office, you may be dismissed from the clinic at the provider's discretion.      For prescription refill requests, have your pharmacy contact our office and allow 72 hours for refills to be completed.    Today you received the following chemotherapy and/or immunotherapy agents : Keytruda    To help prevent nausea and vomiting after your treatment, we encourage you to take your nausea medication as directed.  BELOW ARE SYMPTOMS THAT SHOULD BE REPORTED IMMEDIATELY: *FEVER GREATER THAN 100.4 F (38 C) OR HIGHER *CHILLS OR SWEATING *NAUSEA AND VOMITING THAT IS NOT CONTROLLED WITH YOUR NAUSEA MEDICATION *UNUSUAL SHORTNESS OF BREATH *UNUSUAL BRUISING OR BLEEDING *URINARY PROBLEMS (pain or burning when urinating, or frequent urination) *BOWEL PROBLEMS (unusual diarrhea, constipation, pain near the anus) TENDERNESS IN MOUTH AND THROAT WITH OR WITHOUT PRESENCE OF ULCERS (sore throat, sores in mouth, or a toothache) UNUSUAL RASH, SWELLING OR PAIN  UNUSUAL VAGINAL DISCHARGE OR ITCHING   Items with * indicate a potential emergency and should be followed up as soon as possible or go to the Emergency Department if any problems should occur.  Please show the CHEMOTHERAPY ALERT CARD or IMMUNOTHERAPY ALERT CARD at check-in  to the Emergency Department and triage nurse.  Should you have questions after your visit or need to cancel or reschedule your appointment, please contact CANCER CENTER [place] [facility]  [phone] and follow the prompts.  Office hours are 8:00 a.m. to 4:30 p.m. Monday - Friday. Please note that voicemails left after 4:00 p.m. may not be returned until the following business day.  We are closed weekends and major holidays. You have access to a nurse at all times for urgent questions. Please call the main number to the clinic [phone] and follow the prompts.  For any non-urgent questions, you may also contact your provider using MyChart. We now offer e-Visits for anyone 74 and older to request care online for non-urgent symptoms. For details visit [email or web address].   Also download the MyChart app! Go to the app store, search "MyChart", open the app, select [place], and log in with your MyChart username and password.  Due to Covid, a mask is required upon entering the hospital/clinic. If you do not have a mask, one will be given to you upon arrival. For doctor visits, patients may have 1 support person aged 74 or older with them. For treatment visits, patients cannot have anyone with them due to current Covid guidelines and our immunocompromised population.  

## 2021-01-30 NOTE — Telephone Encounter (Signed)
Oral Oncology Pharmacist Encounter  Received new prescription for Inlyta (axitinib) for the treatment of newly diagnosed metastatic RCC, clear cell in conjunction with pembrolizumab, planned duration until disease progression or unacceptable drug toxicity. Patient is actively receiving pembrolizumab, MD would now like to add on axitinib. Plan to start the Inlyta in 3 weeks.  CMP from 01/30/21 assessed, no relevant lab abnormalities. BP at today's visit well controlled, continue to monitor. Prescription dose and frequency assessed.   Current medication list in Epic reviewed, no DDIs with axitinib identified.  Evaluated chart and no patient barriers to medication adherence identified.   Prescription has been e-scribed to the [facility] for benefits analysis and approval.  Oral Oncology Clinic will continue to follow for insurance authorization, copayment issues, initial counseling and start date.   Darl Pikes, PharmD, BCPS, BCOP, CPP Hematology/Oncology Clinical Pharmacist Practitioner [facility]/HP/AP [facility] [phone]  01/30/2021 10:15 AM

## 2021-01-30 NOTE — Progress Notes (Signed)
Hematology/Oncology Consult note [facility] Telephone:([phone] [phone]   Patient Care Team: Barbara Frank as PCP - General  REFERRING PROVIDER: No ref. provider Frank  CHIEF COMPLAINTS/REASON FOR VISIT:  RCC  HISTORY OF PRESENTING ILLNESS:   Barbara Frank is a  75 y.o.  female with PMH listed below was seen in consultation at the request of  Barbara Frank  for evaluation of right kidney mass.   Patient is a poor historian. Her mood appears to be depressed.  She currently lives at assisted living facility. Moved from [place]. Widowed 3 years ago.  Her power of attorney is Barbara Frank who is her husband's niece. Barbara Frank was called during this encounter.   Extensive medical record review was performed. She has had multiple images done during the past few months, 05/27/20 CT head wo contrast- Barbara acute intracranial abnormality.  06/01/20 CT abdomen and pelvis with contrast 4 mm proximal left ureteral calculus causing mild proximal left- sided hydroureteronephrosis with periureteric and perinephric stranding on the left.5.2cm enhancing right mid anterior kidney mass. Mildly enlarged right retroperitoneal retrocaval lymph nodes suspicious for nodal metastases Mild cardiomegaly, Small hiatal hernia. Status post gastric bypass surgery and cholecystectomy. Postoperative changes anterior abdominal wall. 06/05/20 CT head wo contrast - Barbara acute intracranial abnormality. Probable tiny right frontal scalp Contusion. 2. Mild global volume loss and minimal chronic ischemic small vessel disease. 06/06/20 CT abdomen pelvis wo contrast 1. Indeterminate lesion in the right kidney which is stable may be a solid mass., 2. Partially calcified uterine fibroids. 3. Left UVJ calculus measuring 5 mm 06/07/20 CT head wo contrast.  1. Barbara acute intracranial abnormality. Barbara significant change. 2. Intracranial atherosclerosis, atrophy and chronic small vessel ischemic  changes. 3. Chronic/subacute right maxillary sinus disease.  06/07/20 Cystoscopy by Barbara Frank for Ureteroscopic stone extraction Admitted on 10/3/21due to failure to thrive,  anorexia, dehydration, electrolyte imbalances.   06/09/20 CT abdomen and chest with contrast 1. Right renal mass is stable in size. Most suggestive of a renal neoplasm. 2. Right retroperitoneal lymph nodes are stable in size; may well be metastatic. 3. Left hydronephrosis and hydroureter is stable. Pelvis is not imaged therefore if there is a distal calculus still present, it is not visible. 4. Sclerotic lesions in the anterior left seventh, eighth and ninth ribs which given distribution are most likely healed rib fractures.  She is on Eliquis 5mg  Bid for atrial fibrillation. She reports not taking anti depressants that were given by her PCP.  #11/04/2020, PET scan was obtained. PET scan showed interval increasing size of right upper lobe pulmonary lesion, now 3 cm with an SUV of 3.8.  Associated with a new right lower lobe nodule 8 mm.  5.1 cm right renal mass hypermetabolic activity.  1.9 retrocaval lymph node with SUV of 4.8.  Second index retrocaval node with SUV of 2.  Nodular disease seen in the duodenum on previous study is not well demonstrated on noncontrast CT image today.  Some nodular FDG activity, nonspecific. Multiple anterior abdominal soft tissue nodules are evident.  13 mm anterior left nodule shows Barbara substantial hypermetabolic with an SUV of 1.7.  Calcified left lateral nodule with an SUV of 2.9.  2.2 cm calcified nodule lobe right paracolic gutter with an SUV of 2.5.  12/17/2020, right kidney mass biopsy pathology was reviewed and discussed with patient.  Pathology is consistent with RCC-demonstrates the morphology spectrum, papillary, solid and nested areas.  CD10 positive, racemase positive, CK7 negative.  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  TFE 3 break apart FISH  testing is pending.  #01/05/2021, addendum pathology TFE3 break-apart FISH testing was performed and a translocation  involving Xp11 / TFE3 transcription gene was not detected. HMB-45 IHC is negative  CAIX IHC demonstrates membranous staining.  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 female who has above history reviewed by me today presents for follow up visit for management of right renal clear-cell carcinoma  Problems and complaints are listed below: Patient has received 1 dose of Keytruda 3 weeks ago.  Overall tolerates well.  Barbara nausea vomiting diarrhea. Chronic fatigue is unchanged, patient feels slightly better. Appetite is fair, she reports not satisfied with the food service at her facility Denies any pain today.   Review of Systems  Constitutional: Positive for fatigue. Negative for appetite change, chills and fever.  HENT:   Negative for hearing loss and voice change.   Eyes: Negative for eye problems.  Respiratory: Negative for chest tightness and cough.   Cardiovascular: Negative for chest pain.  Gastrointestinal: Negative for abdominal distention, abdominal pain, blood in stool and constipation.  Endocrine: Negative for hot flashes.  Genitourinary: Negative for difficulty urinating and frequency.   Musculoskeletal: Negative for arthralgias.  Skin: Negative for itching and rash.  Neurological: Negative for extremity weakness.  Hematological: Negative for adenopathy.  Psychiatric/Behavioral: Positive for depression. Negative for confusion.    MEDICAL HISTORY:  Past Medical History:  Diagnosis Date  . A-fib ([place])   . Depression   . Diabetes mellitus without complication ([place])   . History of kidney stones   . Kidney mass     SURGICAL HISTORY: Past Surgical History:  Procedure Laterality Date  . GASTRIC BYPASS    . HERNIA REPAIR    .  TONSILLECTOMY    . URETEROSCOPY WITH HOLMIUM LASER LITHOTRIPSY Left     SOCIAL HISTORY: Social History   Socioeconomic History  . Marital status: Widowed    Spouse name: Not on file  . Number of children: Not on file  . Years of education: Not on file  . Highest education level: Not on file  Occupational History  . Not on file  Tobacco Use  . Smoking status: Never Smoker  . Smokeless tobacco: Never Used  Vaping Use  . Vaping Use: Never used  Substance and Sexual Activity  . Alcohol use: Not Currently  . Drug use: Not Currently  . Sexual activity: Not Currently  Other Topics Concern  . Not on file  Social History Narrative  . Not on file   Social Determinants of Health   Financial Resource Strain: Not on file  Food Insecurity: Not on file  Transportation Needs: Not on file  Physical Activity: Not on file  Stress: Not on file  Social Connections: Not on file  Intimate Partner Violence: Not on file    FAMILY HISTORY: Family History  Problem Relation Age of Onset  . Breast cancer Mother   . Bladder Cancer Neg Hx   . Kidney cancer Neg Hx   . Prostate cancer Neg Hx     ALLERGIES:  is allergic to kiwi extract.  MEDICATIONS:  Current Outpatient Medications  Medication Sig Dispense Refill  . acetaminophen (TYLENOL) 650 MG CR tablet Take 650 mg by mouth every 4 (four) hours as needed for pain or fever.    Mar[place] Kitchen  cetirizine (ZYRTEC) 10 MG tablet Take 10 mg by mouth daily.    . cyanocobalamin (,VITAMIN B-12,) 1000 MCG/ML injection Inject into the muscle.    [place] Kitchen ELIQUIS 5 MG TABS tablet Take 5 mg by mouth 2 (two) times daily.    . insulin glargine (LANTUS) 100 UNIT/ML injection Inject 27 Units into the skin 2 (two) times daily. 27 u in the morning and 27 u at night    . metFORMIN (GLUCOPHAGE) 850 MG tablet Take 1,000 mg by mouth 2 (two) times daily with a meal.    . metoprolol succinate (TOPROL-XL) 50 MG 24 hr tablet Take 50 mg by mouth daily. Take with or immediately  following a meal.    . Psyllium 48.57 % POWD Take by mouth. Takes 1 QD prn    . axitinib (INLYTA) 5 MG tablet Take 1 tablet (5 mg total) by mouth 2 (two) times daily. 60 tablet 0   Barbara current facility-administered medications for this visit.     PHYSICAL EXAMINATION: ECOG PERFORMANCE STATUS: 2 - Symptomatic, <50% confined to bed Vitals:   01/30/21 0915  BP: 134/86  Pulse: (!) 108  Resp: 16  Temp: (!) 96.7 F (35.9 C)   Filed Weights   01/30/21 0915  Weight: 181 lb (82.1 kg)    Physical Exam Constitutional:      General: She is not in acute distress.    Comments: Frail elderly female walks with a walker.   HENT:     Head: Normocephalic and atraumatic.  Eyes:     General: Barbara scleral icterus. Cardiovascular:     Rate and Rhythm: Normal rate. Rhythm irregular.     Heart sounds: Normal heart sounds.  Pulmonary:     Effort: Pulmonary effort is normal. Barbara respiratory distress.     Breath sounds: Barbara wheezing.  Abdominal:     General: Bowel sounds are normal. There is Barbara distension.     Palpations: Abdomen is soft.  Musculoskeletal:        General: Barbara deformity. Normal range of motion.     Cervical back: Normal range of motion and neck supple.  Skin:    General: Skin is warm and dry.     Findings: Barbara erythema or rash.  Neurological:     Mental Status: She is alert. Mental status is at baseline.     Cranial Nerves: Barbara cranial nerve deficit.     Coordination: Coordination normal.     LABORATORY DATA:  I have reviewed the data as listed Lab Results  Component Value Date   WBC 6.9 01/30/2021   HGB 13.1 01/30/2021   HCT 40.9 01/30/2021   MCV 84.0 01/30/2021   PLT 242 01/30/2021   Recent Labs    09/26/20 1243 01/08/21 0822 01/30/21 0822  NA 133* 139 140  K 4.5 4.0 4.2  CL 99 103 102  CO2 27 28 28   GLUCOSE 426* 191* 118*  BUN 16 14 11   CREATININE 0.70 0.49 0.39*  CALCIUM 10.8* 10.3 10.6*  GFRNONAA >60 >60 >60  PROT 6.8 6.5 6.7  ALBUMIN 3.6 3.5 3.8  AST  17 14* 16  ALT 13 11 12   ALKPHOS 98 94 77  BILITOT 0.6 0.4 0.6   Iron/TIBC/Ferritin/ %Sat Barbara results Frank for: IRON, TIBC, FERRITIN, IRONPCTSAT    RADIOGRAPHIC STUDIES: I have personally reviewed the radiological images as listed and agreed with the findings in the report. Barbara results Frank.    ASSESSMENT & PLAN:  1. Renal cell carcinoma  of right kidney ([place])   2. Encounter for antineoplastic immunotherapy   3. Uncontrolled type 2 diabetes mellitus with hyperglycemia (HCC)    #Right kidney mass with retroperitoneal lymphadenopathy, lung mass and peritoneal nodularity. Labs are reviewed and discussed with patient Proceed with Keytruda today. Plan to add axitinib 5 mg twice daily along with cycle 3 Keytruda.  We will look into coverage.  Pharmacist discussed with patient.  #Lung mass maybe a second primary or lung metastatic disease. She is not interested in additional biopsy at this point.   # Hyperglycemia,  DM.  Continue metformin.  Glucose has improved   I encourage patient to continue follow-up with primary care provider. #Depression, she does not want to take any antidepressants. Follow-up with palliative care service. All questions were answered. The patient knows to call the clinic with any problems questions or concerns.  Return of visit:  3 weeks.   [name], MD, PhD Hematology Oncology [facility] at [facility] Pager- [phone] 01/30/2021

## 2021-02-09 ENCOUNTER — Other Ambulatory Visit (HOSPITAL_COMMUNITY): Payer: Self-pay

## 2021-02-11 ENCOUNTER — Other Ambulatory Visit (HOSPITAL_COMMUNITY): Payer: Self-pay

## 2021-02-12 ENCOUNTER — Other Ambulatory Visit (HOSPITAL_COMMUNITY): Payer: Self-pay

## 2021-02-12 NOTE — Telephone Encounter (Signed)
Oral Oncology Patient Advocate Encounter  I spoke with Barbara Frank this morning to set up delivery of Inlyta.  Address verified for shipment.  [name] will be filled through [facility] and mailed 02/12/21 for delivery 02/13/21.  Patient instructed to not start medication when received but to bring with her to her appt on 6/17.  MD will instruct when to start and education will be given by oral chemo pharmacist.    [facility] pharmacy will call 7-10 days before next refill is due to complete adherence call and set up delivery of medication.     [facility] Phone [phone] Fax [phone] 02/12/2021 2:52 PM

## 2021-02-20 ENCOUNTER — Inpatient Hospital Stay: Payer: Medicare PPO

## 2021-02-20 ENCOUNTER — Telehealth: Payer: Self-pay | Admitting: Pharmacist

## 2021-02-20 ENCOUNTER — Inpatient Hospital Stay: Payer: Medicare PPO | Attending: Oncology

## 2021-02-20 ENCOUNTER — Encounter: Payer: Self-pay | Admitting: Oncology

## 2021-02-20 ENCOUNTER — Inpatient Hospital Stay (HOSPITAL_BASED_OUTPATIENT_CLINIC_OR_DEPARTMENT_OTHER): Payer: Medicare PPO | Admitting: Oncology

## 2021-02-20 VITALS — BP 147/80 | HR 61 | Temp 97.4°F | Resp 18 | Wt 181.0 lb

## 2021-02-20 DIAGNOSIS — Z5112 Encounter for antineoplastic immunotherapy: Secondary | ICD-10-CM | POA: Diagnosis present

## 2021-02-20 DIAGNOSIS — F32A Depression, unspecified: Secondary | ICD-10-CM | POA: Diagnosis not present

## 2021-02-20 DIAGNOSIS — C641 Malignant neoplasm of right kidney, except renal pelvis: Secondary | ICD-10-CM | POA: Insufficient documentation

## 2021-02-20 DIAGNOSIS — I4891 Unspecified atrial fibrillation: Secondary | ICD-10-CM | POA: Insufficient documentation

## 2021-02-20 DIAGNOSIS — E1169 Type 2 diabetes mellitus with other specified complication: Secondary | ICD-10-CM

## 2021-02-20 DIAGNOSIS — Z79899 Other long term (current) drug therapy: Secondary | ICD-10-CM | POA: Insufficient documentation

## 2021-02-20 DIAGNOSIS — R918 Other nonspecific abnormal finding of lung field: Secondary | ICD-10-CM | POA: Diagnosis not present

## 2021-02-20 DIAGNOSIS — C649 Malignant neoplasm of unspecified kidney, except renal pelvis: Secondary | ICD-10-CM

## 2021-02-20 DIAGNOSIS — E119 Type 2 diabetes mellitus without complications: Secondary | ICD-10-CM | POA: Insufficient documentation

## 2021-02-20 DIAGNOSIS — Z794 Long term (current) use of insulin: Secondary | ICD-10-CM

## 2021-02-20 LAB — TSH: TSH: 2.152 u[IU]/mL (ref 0.350–4.500)

## 2021-02-20 LAB — CBC WITH DIFFERENTIAL/PLATELET
Abs Immature Granulocytes: 0.06 10*3/uL (ref 0.00–0.07)
Basophils Absolute: 0 10*3/uL (ref 0.0–0.1)
Basophils Relative: 0 %
Eosinophils Absolute: 0.1 10*3/uL (ref 0.0–0.5)
Eosinophils Relative: 1 %
HCT: 43 % (ref 36.0–46.0)
Hemoglobin: 13.5 g/dL (ref 12.0–15.0)
Immature Granulocytes: 1 %
Lymphocytes Relative: 15 %
Lymphs Abs: 1.5 10*3/uL (ref 0.7–4.0)
MCH: 26.7 pg (ref 26.0–34.0)
MCHC: 31.4 g/dL (ref 30.0–36.0)
MCV: 85.1 fL (ref 80.0–100.0)
Monocytes Absolute: 0.6 10*3/uL (ref 0.1–1.0)
Monocytes Relative: 6 %
Neutro Abs: 8.3 10*3/uL — ABNORMAL HIGH (ref 1.7–7.7)
Neutrophils Relative %: 77 %
Platelets: 255 10*3/uL (ref 150–400)
RBC: 5.05 MIL/uL (ref 3.87–5.11)
RDW: 13.7 % (ref 11.5–15.5)
WBC: 10.6 10*3/uL — ABNORMAL HIGH (ref 4.0–10.5)
nRBC: 0 % (ref 0.0–0.2)

## 2021-02-20 LAB — COMPREHENSIVE METABOLIC PANEL
ALT: 11 U/L (ref 0–44)
AST: 19 U/L (ref 15–41)
Albumin: 3.9 g/dL (ref 3.5–5.0)
Alkaline Phosphatase: 88 U/L (ref 38–126)
Anion gap: 11 (ref 5–15)
BUN: 16 mg/dL (ref 8–23)
CO2: 28 mmol/L (ref 22–32)
Calcium: 10.9 mg/dL — ABNORMAL HIGH (ref 8.9–10.3)
Chloride: 98 mmol/L (ref 98–111)
Creatinine, Ser: 0.52 mg/dL (ref 0.44–1.00)
GFR, Estimated: >60 mL/min (ref >60)
Glucose, Bld: 159 mg/dL — ABNORMAL HIGH (ref 70–99)
Potassium: 4.4 mmol/L (ref 3.5–5.1)
Sodium: 137 mmol/L (ref 135–145)
Total Bilirubin: 0.8 mg/dL (ref 0.3–1.2)
Total Protein: 7.4 g/dL (ref 6.5–8.1)

## 2021-02-20 MED ORDER — SODIUM CHLORIDE 0.9 % IV SOLN
Freq: Once | INTRAVENOUS | Status: AC
  Filled 2021-02-20: qty 250

## 2021-02-20 MED ORDER — SODIUM CHLORIDE 0.9 % IV SOLN
200.0000 mg | Freq: Once | INTRAVENOUS | Status: AC
  Administered 2021-02-20: 200 mg via INTRAVENOUS
  Filled 2021-02-20: qty 8

## 2021-02-20 NOTE — Progress Notes (Signed)
Patient here for follow up. Pt reports she has been having low blood sugars in the mornings.

## 2021-02-20 NOTE — Progress Notes (Signed)
Hematology/Oncology Consult note [facility] Telephone:([phone] Fax:(336) [phone]   Patient Care Team: Sheral Apley as PCP - General  REFERRING PROVIDER: [name], MD  CHIEF COMPLAINTS/REASON FOR VISIT:  RCC  HISTORY OF PRESENTING ILLNESS:   Barbara Frank is a  75 y.o.  female with PMH listed below was seen in consultation at the request of  [name], MD  for evaluation of right kidney mass.   Patient is a poor historian. Her mood appears to be depressed.  She currently lives at assisted living facility. Moved from [place]. Widowed 3 years ago.  Her power of attorney is [name] who is her husband's niece. Barbara Frank was called during this encounter.   Extensive medical record review was performed. She has had multiple images done during the past few months, 05/27/20 CT head wo contrast- no acute intracranial abnormality.  06/01/20 CT abdomen and pelvis with contrast 4 mm proximal left ureteral calculus causing mild proximal left- sided hydroureteronephrosis with periureteric and perinephric stranding on the left.5.2cm enhancing right mid anterior kidney mass. Mildly enlarged right retroperitoneal retrocaval lymph nodes suspicious for nodal metastases Mild cardiomegaly, Small hiatal hernia. Status post gastric bypass surgery and cholecystectomy. Postoperative changes anterior abdominal wall. 06/05/20 CT head wo contrast - No acute intracranial abnormality. Probable tiny right frontal scalp Contusion. 2. Mild global volume loss and minimal chronic ischemic small vessel disease. 06/06/20 CT abdomen pelvis wo contrast 1. Indeterminate lesion in the right kidney which is stable may be a solid mass., 2. Partially calcified uterine fibroids. 3. Left UVJ calculus measuring 5 mm 06/07/20 CT head wo contrast.  1. No acute intracranial abnormality. No significant change. 2. Intracranial atherosclerosis, atrophy and chronic small vessel ischemic changes. 3.  Chronic/subacute right maxillary sinus disease.  06/07/20 Cystoscopy by [name] for Ureteroscopic stone extraction Admitted on 10/3/21due to failure to thrive,  anorexia, dehydration, electrolyte imbalances.   06/09/20 CT abdomen and chest with contrast 1. Right renal mass is stable in size. Most suggestive of a renal neoplasm. 2. Right retroperitoneal lymph nodes are stable in size; may well be metastatic. 3. Left hydronephrosis and hydroureter is stable. Pelvis is not imaged therefore if there is a distal calculus still present, it is not visible. 4. Sclerotic lesions in the anterior left seventh, eighth and ninth ribs which given distribution are most likely healed rib fractures.  She is on Eliquis 23m Bid for atrial fibrillation. She reports not taking anti depressants that were given by her PCP.  #11/04/2020, PET scan was obtained. PET scan showed interval increasing size of right upper lobe pulmonary lesion, now 3 cm with an SUV of 3.8.  Associated with a new right lower lobe nodule 8 mm.  5.1 cm right renal mass hypermetabolic activity.  1.9 retrocaval lymph node with SUV of 4.8.  Second index retrocaval node with SUV of 2.  Nodular disease seen in the duodenum on previous study is not well demonstrated on noncontrast CT image today.  Some nodular FDG activity, nonspecific. Multiple anterior abdominal soft tissue nodules are evident.  13 mm anterior left nodule shows no substantial hypermetabolic with an SUV of 1.7.  Calcified left lateral nodule with an SUV of 2.9.  2.2 cm calcified nodule lobe right paracolic gutter with an SUV of 2.5.  12/17/2020, right kidney mass biopsy pathology was reviewed and discussed with patient.  Pathology is consistent with RCC-demonstrates the morphology spectrum, papillary, solid and nested areas.  CD10 positive, racemase positive, CK7 negative.  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  TFE 3 break apart FISH testing is  pending.  #01/05/2021, addendum pathology TFE3 break-apart FISH testing was performed and a translocation  involving Xp11 / TFE3 transcription gene was not detected. HMB-45 IHC is negative  CAIX IHC demonstrates membranous staining.  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 female who has above history reviewed by me today presents for follow up visit for management of right renal clear-cell carcinoma  Problems and complaints are listed below: Currently on palliative Keytruda treatments and she tolerates well She denies any pain.   Patient takes Lantus 27 units at bedtime and 27 units in the morning.  She reports low blood sugar in the morning ranging from 60s to 80s.  Usually 4-5 a.m. She has not had breakfast this morning and glucose level at our clinic is 1 59.   Review of Systems  Constitutional:  Positive for fatigue. Negative for appetite change, chills and fever.  HENT:   Negative for hearing loss and voice change.   Eyes:  Negative for eye problems.  Respiratory:  Negative for chest tightness and cough.   Cardiovascular:  Negative for chest pain.  Gastrointestinal:  Negative for abdominal distention, abdominal pain, blood in stool and constipation.  Endocrine: Negative for hot flashes.  Genitourinary:  Negative for difficulty urinating and frequency.   Musculoskeletal:  Negative for arthralgias.  Skin:  Negative for itching and rash.  Neurological:  Negative for extremity weakness.  Hematological:  Negative for adenopathy.  Psychiatric/Behavioral:  Positive for depression. Negative for confusion.    MEDICAL HISTORY:  Past Medical History:  Diagnosis Date   A-fib ([place])    Depression    Diabetes mellitus without complication ([place])    History of kidney stones    Kidney mass      SURGICAL HISTORY: Past Surgical History:  Procedure Laterality Date   GASTRIC BYPASS     HERNIA REPAIR     TONSILLECTOMY     URETEROSCOPY WITH HOLMIUM LASER LITHOTRIPSY Left     SOCIAL HISTORY: Social History   Socioeconomic History   Marital status: Widowed    Spouse name: Not on file   Number of children: Not on file   Years of education: Not on file   Highest education level: Not on file  Occupational History   Not on file  Tobacco Use   Smoking status: Never   Smokeless tobacco: Never  Vaping Use   Vaping Use: Never used  Substance and Sexual Activity   Alcohol use: Not Currently   Drug use: Not Currently   Sexual activity: Not Currently  Other Topics Concern   Not on file  Social History Narrative   Not on file   Social Determinants of Health   Financial Resource Strain: Not on file  Food Insecurity: Not on file  Transportation Needs: Not on file  Physical Activity: Not on file  Stress: Not on file  Social Connections: Not on file  Intimate Partner Violence: Not on file    FAMILY HISTORY: Family History  Problem Relation Age of Onset   Breast cancer Mother    Bladder Cancer Neg Hx    Kidney cancer Neg Hx    Prostate cancer Neg Hx     ALLERGIES:  is allergic to kiwi  extract.  MEDICATIONS:  Current Outpatient Medications  Medication Sig Dispense Refill   acetaminophen (TYLENOL) 650 MG CR tablet Take 650 mg by mouth every 4 (four) hours as needed for pain or fever.     axitinib (INLYTA) 5 MG tablet Take 1 tablet (5 mg total) by mouth 2 (two) times daily. 60 tablet 0   cetirizine (ZYRTEC) 10 MG tablet Take 10 mg by mouth daily.     cyanocobalamin (,VITAMIN B-12,) 1000 MCG/ML injection Inject into the muscle.     ELIQUIS 5 MG TABS tablet Take 5 mg by mouth 2 (two) times daily.     insulin glargine (LANTUS) 100 UNIT/ML injection Inject 27 Units into the skin 2 (two) times daily. 27 u in the morning and 27 u at night     metFORMIN (GLUCOPHAGE) 850  MG tablet Take 1,000 mg by mouth 2 (two) times daily with a meal.     metoprolol succinate (TOPROL-XL) 50 MG 24 hr tablet Take 50 mg by mouth daily. Take with or immediately following a meal.     Psyllium 48.57 % POWD Take by mouth. Takes 1 QD prn     No current facility-administered medications for this visit.     PHYSICAL EXAMINATION: ECOG PERFORMANCE STATUS: 2 - Symptomatic, <50% confined to bed Vitals:   02/20/21 0849  BP: (!) 147/80  Pulse: 61  Resp: 18  Temp: (!) 97.4 F (36.3 C)  SpO2: 99%   Filed Weights   02/20/21 0849  Weight: 181 lb (82.1 kg)    Physical Exam Constitutional:      General: She is not in acute distress.    Comments: Frail elderly female walks with a walker.   HENT:     Head: Normocephalic and atraumatic.  Eyes:     General: No scleral icterus. Cardiovascular:     Rate and Rhythm: Normal rate. Rhythm irregular.     Heart sounds: Normal heart sounds.  Pulmonary:     Effort: Pulmonary effort is normal. No respiratory distress.     Breath sounds: No wheezing.  Abdominal:     General: Bowel sounds are normal. There is no distension.     Palpations: Abdomen is soft.  Musculoskeletal:        General: No deformity. Normal range of motion.     Cervical back: Normal range of motion and neck supple.  Skin:    General: Skin is warm and dry.     Findings: No erythema or rash.  Neurological:     Mental Status: She is alert. Mental status is at baseline.     Cranial Nerves: No cranial nerve deficit.     Coordination: Coordination normal.    LABORATORY DATA:  I have reviewed the data as listed Lab Results  Component Value Date   WBC 10.6 (H) 02/20/2021   HGB 13.5 02/20/2021   HCT 43.0 02/20/2021   MCV 85.1 02/20/2021   PLT 255 02/20/2021   Recent Labs    01/08/21 0822 01/30/21 0822 02/20/21 0835  NA 139 140 137  K 4.0 4.2 4.4  CL 103 102 98  CO2 $Re'28 28 28  'nKj$ GLUCOSE 191* 118* 159*  BUN $Re'14 11 16  'eOF$ CREATININE 0.49 0.39* 0.52  CALCIUM 10.3  10.6* 10.9*  GFRNONAA >60 >60 >60  PROT 6.5 6.7 7.4  ALBUMIN 3.5 3.8 3.9  AST 14* 16 19  ALT $Re'11 12 11  'IPn$ ALKPHOS 94 77 88  BILITOT 0.4 0.6 0.8    Iron/TIBC/Ferritin/ %Sat No  results found for: IRON, TIBC, FERRITIN, IRONPCTSAT    RADIOGRAPHIC STUDIES: I have personally reviewed the radiological images as listed and agreed with the findings in the report. No results found.    ASSESSMENT & PLAN:  1. Renal cell carcinoma of right kidney ([place])   2. Encounter for antineoplastic immunotherapy   3. Lung mass   4. Type 2 diabetes mellitus with other specified complication, with long-term current use of insulin ([place])   5. Hypercalcemia    #Right kidney mass with retroperitoneal lymphadenopathy, lung mass and peritoneal nodularity. Labs are reviewed and are discussed with patient.  Clinically she is doing well Proceed with Keytruda today. Plan to add axitinib 5 mg twice daily. Discussed about potential side effects profile with her.  Oral chemotherapy pharmacist will call her chemotherapy education.  Discussed with Ebony Hail #Lung mass maybe a second primary or lung metastatic disease. She is not interested in additional biopsy at this point.   #Hypercalcemia, check PTH at the next visit # Hyperglycemia,  DM.  Continue metformin and Lantus.  Recommend patient to keep a log of her blood glucose and further discuss with primary care provider.  Low morning glucose level, consider lower Lantus dose at bedtime.    #Depression, she does not want to take any antidepressants. Follow-up with palliative care service. All questions were answered. The patient knows to call the clinic with any problems questions or concerns.  Follow-up  Lab NP/1 week possible IV fluid/evaluation of tolerability Lab MD 3 weeks Keytruda possible IV fluid   [name], MD, PhD Hematology Oncology [facility] at [facility] Pager- [phone] 02/20/2021

## 2021-02-20 NOTE — Telephone Encounter (Signed)
Oral Chemotherapy Pharmacist Encounter  Patient Education I spoke with patient for overview of new oral chemotherapy medication: Inlyta (axitinib) for the treatment of newly diagnosed metastatic RCC, clear cell in conjunction with pembrolizumab, planned duration until disease progression or unacceptable drug toxicity.   Pt is doing well. Counseled patient on administration, dosing, side effects, monitoring, drug-food interactions, safe handling, storage, and disposal. Patient will take 1 tablet (5 mg total) by mouth 2 (two) times daily.  She had trouble opening the bottle on the phone during our education. She stated that she was having dinner with someone that evening who could help her. I asked that she call me if she continues to have trouble opening the medication bottle.  Side effects include but not limited to: HTN, diarrhea, fatigue, hand-foot syndrome.    Reviewed with patient importance of keeping a medication schedule and plan for any missed doses.  After discussion with patient one patient barriers to medication adherence identified. Opening the medication bottle, as mentioned above I asked that she call me if this problem persist.   Ms. Bloch voiced understanding and appreciation. All questions answered. Medication handout provided.  Provided patient with [facility] phone number. Patient knows to call the office with questions or concerns. Oral Chemotherapy Navigation Clinic will continue to follow.  Darl Pikes, PharmD, BCPS, BCOP, CPP Hematology/Oncology Clinical Pharmacist Practitioner [facility] [phone]  02/20/2021 4:10 PM

## 2021-02-20 NOTE — Patient Instructions (Signed)
[facility]  Discharge Instructions: Thank you for choosing [facility] to provide your oncology and hematology care.  If you have a lab appointment with the Cancer Center, please go directly to the Cancer Center and check in at the registration area.  Wear comfortable clothing and clothing appropriate for easy access to any Portacath or PICC line.   We strive to give you quality time with your provider. You may need to reschedule your appointment if you arrive late (15 or more minutes).  Arriving late affects you and other patients whose appointments are after yours.  Also, if you miss three or more appointments without notifying the office, you may be dismissed from the clinic at the provider's discretion.      For prescription refill requests, have your pharmacy contact our office and allow 72 hours for refills to be completed.    Today you received the following chemotherapy and/or immunotherapy agents : Keytruda    To help prevent nausea and vomiting after your treatment, we encourage you to take your nausea medication as directed.  BELOW ARE SYMPTOMS THAT SHOULD BE REPORTED IMMEDIATELY: *FEVER GREATER THAN 100.4 F (38 C) OR HIGHER *CHILLS OR SWEATING *NAUSEA AND VOMITING THAT IS NOT CONTROLLED WITH YOUR NAUSEA MEDICATION *UNUSUAL SHORTNESS OF BREATH *UNUSUAL BRUISING OR BLEEDING *URINARY PROBLEMS (pain or burning when urinating, or frequent urination) *BOWEL PROBLEMS (unusual diarrhea, constipation, pain near the anus) TENDERNESS IN MOUTH AND THROAT WITH OR WITHOUT PRESENCE OF ULCERS (sore throat, sores in mouth, or a toothache) UNUSUAL RASH, SWELLING OR PAIN  UNUSUAL VAGINAL DISCHARGE OR ITCHING   Items with * indicate a potential emergency and should be followed up as soon as possible or go to the Emergency Department if any problems should occur.  Please show the CHEMOTHERAPY ALERT CARD or IMMUNOTHERAPY ALERT CARD at check-in  to the Emergency Department and triage nurse.  Should you have questions after your visit or need to cancel or reschedule your appointment, please contact [facility]  [phone] and follow the prompts.  Office hours are 8:00 a.m. to 4:30 p.m. Monday - Friday. Please note that voicemails left after 4:00 p.m. may not be returned until the following business day.  We are closed weekends and major holidays. You have access to a nurse at all times for urgent questions. Please call the main number to the clinic [phone] and follow the prompts.  For any non-urgent questions, you may also contact your provider using MyChart. We now offer e-Visits for anyone 18 and older to request care online for non-urgent symptoms. For details visit [email or web address].   Also download the MyChart app! Go to the app store, search "MyChart", open the app, select [facility], and log in with your MyChart username and password.  Due to Covid, a mask is required upon entering the hospital/clinic. If you do not have a mask, one will be given to you upon arrival. For doctor visits, patients may have 1 support person aged 18 or older with them. For treatment visits, patients cannot have anyone with them due to current Covid guidelines and our immunocompromised population.  

## 2021-02-27 ENCOUNTER — Inpatient Hospital Stay: Payer: Medicare PPO

## 2021-02-27 ENCOUNTER — Other Ambulatory Visit: Payer: Self-pay | Admitting: Pharmacist

## 2021-02-27 ENCOUNTER — Inpatient Hospital Stay: Payer: Medicare PPO | Admitting: Oncology

## 2021-02-27 VITALS — BP 113/76 | HR 84 | Temp 97.0°F | Wt 178.0 lb

## 2021-02-27 DIAGNOSIS — C641 Malignant neoplasm of right kidney, except renal pelvis: Secondary | ICD-10-CM

## 2021-02-27 DIAGNOSIS — Z5112 Encounter for antineoplastic immunotherapy: Secondary | ICD-10-CM | POA: Diagnosis not present

## 2021-02-27 DIAGNOSIS — C649 Malignant neoplasm of unspecified kidney, except renal pelvis: Secondary | ICD-10-CM

## 2021-02-27 LAB — CBC WITH DIFFERENTIAL/PLATELET
Abs Immature Granulocytes: 0.05 10*3/uL (ref 0.00–0.07)
Basophils Absolute: 0 10*3/uL (ref 0.0–0.1)
Basophils Relative: 1 %
Eosinophils Absolute: 0.1 10*3/uL (ref 0.0–0.5)
Eosinophils Relative: 1 %
HCT: 41.4 % (ref 36.0–46.0)
Hemoglobin: 13.5 g/dL (ref 12.0–15.0)
Immature Granulocytes: 1 %
Lymphocytes Relative: 22 %
Lymphs Abs: 1.4 10*3/uL (ref 0.7–4.0)
MCH: 26.5 pg (ref 26.0–34.0)
MCHC: 32.6 g/dL (ref 30.0–36.0)
MCV: 81.3 fL (ref 80.0–100.0)
Monocytes Absolute: 0.6 10*3/uL (ref 0.1–1.0)
Monocytes Relative: 10 %
Neutro Abs: 4.2 10*3/uL (ref 1.7–7.7)
Neutrophils Relative %: 65 %
Platelets: 233 10*3/uL (ref 150–400)
RBC: 5.09 MIL/uL (ref 3.87–5.11)
RDW: 13.6 % (ref 11.5–15.5)
WBC: 6.4 10*3/uL (ref 4.0–10.5)
nRBC: 0 % (ref 0.0–0.2)

## 2021-02-27 LAB — COMPREHENSIVE METABOLIC PANEL
ALT: 14 U/L (ref 0–44)
AST: 22 U/L (ref 15–41)
Albumin: 3.6 g/dL (ref 3.5–5.0)
Alkaline Phosphatase: 92 U/L (ref 38–126)
Anion gap: 9 (ref 5–15)
BUN: 14 mg/dL (ref 8–23)
CO2: 26 mmol/L (ref 22–32)
Calcium: 10.6 mg/dL — ABNORMAL HIGH (ref 8.9–10.3)
Chloride: 103 mmol/L (ref 98–111)
Creatinine, Ser: 0.72 mg/dL (ref 0.44–1.00)
GFR, Estimated: >60 mL/min (ref >60)
Glucose, Bld: 301 mg/dL — ABNORMAL HIGH (ref 70–99)
Potassium: 4.7 mmol/L (ref 3.5–5.1)
Sodium: 138 mmol/L (ref 135–145)
Total Bilirubin: 0.7 mg/dL (ref 0.3–1.2)
Total Protein: 6.8 g/dL (ref 6.5–8.1)

## 2021-02-27 MED ORDER — SODIUM CHLORIDE 0.9 % IV SOLN
Freq: Once | INTRAVENOUS | Status: AC
  Filled 2021-02-27: qty 250

## 2021-02-27 NOTE — Progress Notes (Signed)
Oral Chemotherapy Pharmacist Encounter   Patient reported to Sonia Baller, NP that she was taking a new medication but could not remember the name. I called [facility] and they reviewed her medication list with me. The new medication filled her her was sertraline 25mg . Added this medication to her medication list.   Darl Pikes, PharmD, BCPS, BCOP, CPP Hematology/Oncology Clinical Pharmacist [facility] [phone]  02/27/2021 1:03 PM

## 2021-02-27 NOTE — Progress Notes (Signed)
Hematology/Oncology Consult note [facility] [phone] [phone]   Patient Care Team: Barbara Frank as PCP - General  REFERRING PROVIDER: [name], MD  CHIEF COMPLAINTS/REASON FOR VISIT:  RCC  HISTORY OF PRESENTING ILLNESS:   Barbara Frank is a  75 y.o.  female with PMH listed below was seen in consultation at the request of  Barbara Server, MD  for evaluation of right kidney mass.   Patient is a poor historian. Her mood appears to be depressed.  She currently lives at assisted living facility. Moved from [place]. Widowed 3 years ago.  Her power of attorney is Barbara Frank who is her husband's niece. Barbara Frank was called during this encounter.   Extensive medical record review was performed. She has had multiple images done during the past few months, 05/27/20 CT head wo contrast- no acute intracranial abnormality.  06/01/20 CT abdomen and pelvis with contrast 4 mm proximal left ureteral calculus causing mild proximal left- sided hydroureteronephrosis with periureteric and perinephric stranding on the left.5.2cm enhancing right mid anterior kidney mass. Mildly enlarged right retroperitoneal retrocaval lymph nodes suspicious for nodal metastases Mild cardiomegaly, Small hiatal hernia. Status post gastric bypass surgery and cholecystectomy. Postoperative changes anterior abdominal wall. 06/05/20 CT head wo contrast - No acute intracranial abnormality. Probable tiny right frontal scalp Contusion. 2. Mild global volume loss and minimal chronic ischemic small vessel disease. 06/06/20 CT abdomen pelvis wo contrast 1. Indeterminate lesion in the right kidney which is stable may be a solid mass., 2. Partially calcified uterine fibroids. 3. Left UVJ calculus measuring 5 mm 06/07/20 CT head wo contrast.  1. No acute intracranial abnormality. No significant change. 2. Intracranial atherosclerosis, atrophy and chronic small vessel ischemic changes. 3.  Chronic/subacute right maxillary sinus disease.  06/07/20 Cystoscopy by Barbara Frank for Ureteroscopic stone extraction Admitted on 10/3/21due to failure to thrive,  anorexia, dehydration, electrolyte imbalances.   06/09/20 CT abdomen and chest with contrast 1. Right renal mass is stable in size. Most suggestive of a renal neoplasm. 2. Right retroperitoneal lymph nodes are stable in size; may well be metastatic. 3. Left hydronephrosis and hydroureter is stable. Pelvis is not imaged therefore if there is a distal calculus still present, it is not visible. 4. Sclerotic lesions in the anterior left seventh, eighth and ninth ribs which given distribution are most likely healed rib fractures.  She is on Eliquis 23m Bid for atrial fibrillation. She reports not taking anti depressants that were given by her PCP.  #11/04/2020, PET scan was obtained. PET scan showed interval increasing size of right upper lobe pulmonary lesion, now 3 cm with an SUV of 3.8.  Associated with a new right lower lobe nodule 8 mm.  5.1 cm right renal mass hypermetabolic activity.  1.9 retrocaval lymph node with SUV of 4.8.  Second index retrocaval node with SUV of 2.  Nodular disease seen in the duodenum on previous study is not well demonstrated on noncontrast CT image today.  Some nodular FDG activity, nonspecific. Multiple anterior abdominal soft tissue nodules are evident.  13 mm anterior left nodule shows no substantial hypermetabolic with an SUV of 1.7.  Calcified left lateral nodule with an SUV of 2.9.  2.2 cm calcified nodule lobe right paracolic gutter with an SUV of 2.5.  12/17/2020, right kidney mass biopsy pathology was reviewed and discussed with patient.  Pathology is consistent with RCC-demonstrates the morphology spectrum, papillary, solid and nested areas.  CD10 positive, racemase positive, CK7 negative.  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  TFE 3 break apart FISH testing is  pending.  #01/05/2021, addendum pathology TFE3 break-apart FISH testing was performed and a translocation  involving Xp11 / TFE3 transcription gene was not detected. HMB-45 IHC is negative  CAIX IHC demonstrates membranous staining.  The presence of membranous CAIX staining lends support to subclassification as "clear cell renal cell carcinoma  Omniseq SETD2 S1176f, VHL T1258f TMB 5.4, MS stable, PD-L1 <1%, ADORA2A  INTERVAL HISTORY Barbara Frank a [age].o. female who has above history who presents for follow-up for renal clear cell carcinoma.  She is status post 3 cycles last given on 02/20/2021.  She also recently started on Inlyta.  Her first dose was on Monday.  Appears to be tolerating well.  Endorses 1 day of diarrhea since initiating Inlyta.  Did not require Imodium.  She denies any pain.  Reports a fall within the past week due to tripping.  She was able to get herself up and only fell on her left knee.  She has chronic fatigue but overall feels well.  Appetite is stable.  Feels that she is inconsistent with drinking fluids.   Review of Systems  Constitutional:  Positive for fatigue. Negative for appetite change, chills and fever.  HENT:  Negative.  Negative for hearing loss, lump/mass, mouth sores and nosebleeds.   Eyes: Negative.  Negative for eye problems.  Respiratory:  Negative for cough, hemoptysis and shortness of breath.   Cardiovascular: Negative.  Negative for chest pain and leg swelling.  Gastrointestinal: Negative.  Negative for abdominal pain, blood in stool, constipation, diarrhea, nausea and vomiting.  Endocrine: Negative.  Negative for hot flashes.  Genitourinary: Negative.  Negative for bladder incontinence, difficulty urinating, dysuria, frequency and hematuria.   Musculoskeletal:  Positive for gait problem (Recent fall- no injury). Negative for back pain, flank pain and myalgias.  Skin:  Positive for wound (Brusing to left knee d/t fall). Negative for itching  and rash.  Neurological:  Positive for gait problem (Recent fall- no injury). Negative for dizziness, headaches, light-headedness and numbness.  Hematological: Negative.  Negative for adenopathy.  Psychiatric/Behavioral:  Negative for confusion. The patient is not nervous/anxious.    MEDICAL HISTORY:  Past Medical History:  Diagnosis Date   A-fib ([facility]   Depression    Diabetes mellitus without complication ([facility]   History of kidney stones    Kidney mass     SURGICAL HISTORY: Past Surgical History:  Procedure Laterality Date   GASTRIC BYPASS     HERNIA REPAIR     TONSILLECTOMY     URETEROSCOPY WITH HOLMIUM LASER LITHOTRIPSY Left     SOCIAL HISTORY: Social History   Socioeconomic History   Marital status: Widowed    Spouse name: Not on file   Number of children: Not on file   Years of education: Not on file   Highest education level: Not on file  Occupational History   Not on file  Tobacco Use   Smoking status: Never   Smokeless tobacco: Never  Vaping Use   Vaping Use: Never used  Substance and Sexual Activity   Alcohol use: Not Currently   Drug use: Not Currently   Sexual activity: Not Currently  Other Topics Concern   Not on file  Social History Narrative   Not on file   Social Determinants of Health   Financial Resource Strain: Not on file  Food Insecurity: Not on file  Transportation  Needs: Not on file  Physical Activity: Not on file  Stress: Not on file  Social Connections: Not on file  Intimate Partner Violence: Not on file    FAMILY HISTORY: Family History  Problem Relation Age of Onset   Breast cancer Mother    Bladder Cancer Neg Hx    Kidney cancer Neg Hx    Prostate cancer Neg Hx     ALLERGIES:  is allergic to kiwi extract.  MEDICATIONS:  Current Outpatient Medications  Medication Sig Dispense Refill   axitinib (INLYTA) 5 MG tablet Take 1 tablet (5 mg total) by mouth 2 (two) times daily. 60 tablet 0   cetirizine (ZYRTEC) 10 MG  tablet Take 10 mg by mouth daily.     cyanocobalamin (,VITAMIN B-12,) 1000 MCG/ML injection Inject into the muscle.     ELIQUIS 5 MG TABS tablet Take 5 mg by mouth 2 (two) times daily.     insulin glargine (LANTUS) 100 UNIT/ML injection Inject 27 Units into the skin 2 (two) times daily. 27 u in the morning and 27 u at night     metFORMIN (GLUCOPHAGE) 850 MG tablet Take 1,000 mg by mouth 2 (two) times daily with a meal.     metoprolol succinate (TOPROL-XL) 50 MG 24 hr tablet Take 50 mg by mouth daily. Take with or immediately following a meal.     Psyllium 48.57 % POWD Take by mouth. Takes 1 QD prn     acetaminophen (TYLENOL) 650 MG CR tablet Take 650 mg by mouth every 4 (four) hours as needed for pain or fever. (Patient not taking: Reported on 02/27/2021)     No current facility-administered medications for this visit.     PHYSICAL EXAMINATION: ECOG PERFORMANCE STATUS: 2 - Symptomatic, <50% confined to bed Vitals:   02/27/21 1043  BP: 113/76  Pulse: 84  Temp: (!) 97 F (36.1 C)  SpO2: 98%   Filed Weights   02/27/21 1043  Weight: 178 lb (80.7 kg)    Physical Exam Constitutional:      General: She is not in acute distress.    Comments: Frail elderly female walks with a walker.   HENT:     Head: Normocephalic and atraumatic.  Eyes:     General: No scleral icterus. Cardiovascular:     Rate and Rhythm: Normal rate. Rhythm irregular.     Heart sounds: Normal heart sounds.  Pulmonary:     Effort: Pulmonary effort is normal. No respiratory distress.     Breath sounds: No wheezing.  Abdominal:     General: Bowel sounds are normal. There is no distension.     Palpations: Abdomen is soft.  Musculoskeletal:        General: No deformity. Normal range of motion.     Cervical back: Normal range of motion and neck supple.  Skin:    General: Skin is warm and dry.     Findings: No erythema or rash.  Neurological:     Mental Status: She is alert. Mental status is at baseline.      Cranial Nerves: No cranial nerve deficit.     Coordination: Coordination normal.    LABORATORY DATA:  I have reviewed the data as listed Lab Results  Component Value Date   WBC 6.4 02/27/2021   HGB 13.5 02/27/2021   HCT 41.4 02/27/2021   MCV 81.3 02/27/2021   PLT 233 02/27/2021   Recent Labs    01/30/21 0822 02/20/21 0835 02/27/21 1009  NA 140  137 138  K 4.2 4.4 4.7  CL 102 98 103  CO2 _0 GLUCOSE 118* 159* 301*  BUN _1 CREATININE 0.39* 0.52 0.72  CALCIUM 10.6* 10.9* 10.6*  GFRNONAA >60 >60 >60  PROT 6.7 7.4 6.8  ALBUMIN 3.8 3.9 3.6  AST _2 ALT _3 ALKPHOS 77 88 92  BILITOT 0.6 0.8 0.7    Iron/TIBC/Ferritin/ %Sat No results found for: IRON, TIBC, FERRITIN, IRONPCTSAT    RADIOGRAPHIC STUDIES: I have personally reviewed the radiological images as listed and agreed with the findings in the report. No results found.    ASSESSMENT & PLAN:  No diagnosis found.  Stage IV clear-cell renal cancer: Status post 3 cycles of Keytruda and started Inlyta on 02/23/2021. Appears to be tolerating treatment well.   Denies any symptoms or concerns since initiating Inlyta Labs from 02/27/2021 are WNL.  Diarrhea: Secondary to Inlyta (incidence 55% per up-to-date) Did not require the use of Imodium. Encouraged her to use Imodium if this returns.  Hypercalcemia: Today her calcium level is 10.6. Stable. PTH pending.  Hyperglycemia: Her blood sugar is 301 today. States she did not take her Lantus last night. Recommend she check her blood sugar when she gets home.  Depression: She was recently started on Zoloft. Spoke with Thayer Dallas and no interaction with Inlyta.  Dehydration: Not apparent on her labs today. Patient would like half a liter of normal saline given she does not drink enough fluids throughout the day. Okay to proceed with fluids.  Disposition: Proceed with 500 mL fluid bolus today. Continue Inlyta RTC every 3 weeks for  Keytruda. She is scheduled to return on 03/20/2021.    Greater than 50% was spent in counseling and coordination of care with this patient including but not limited to discussion of the relevant topics above (See A&P) including, but not limited to diagnosis and management of acute and chronic medical conditions.   Faythe Casa, NP 02/27/2021 2:14 PM

## 2021-02-28 LAB — PARATHYROID HORMONE, INTACT (NO CA): PTH: 208 pg/mL — ABNORMAL HIGH (ref 15–65)

## 2021-03-05 LAB — PTH-RELATED PEPTIDE: PTH-related peptide: <2 pmol/L

## 2021-03-06 ENCOUNTER — Other Ambulatory Visit: Payer: Self-pay

## 2021-03-06 ENCOUNTER — Emergency Department: Payer: Medicare PPO

## 2021-03-06 ENCOUNTER — Observation Stay: Payer: Medicare PPO

## 2021-03-06 ENCOUNTER — Inpatient Hospital Stay
Admission: EM | Admit: 2021-03-06 | Discharge: 2021-03-16 | DRG: 689 | Disposition: A | Discharge status: Skilled Nursing Facility | Payer: Medicare PPO | Attending: Internal Medicine | Admitting: Internal Medicine

## 2021-03-06 ENCOUNTER — Encounter: Payer: Self-pay | Admitting: Emergency Medicine

## 2021-03-06 DIAGNOSIS — F0390 Unspecified dementia without behavioral disturbance: Secondary | ICD-10-CM | POA: Diagnosis present

## 2021-03-06 DIAGNOSIS — E1165 Type 2 diabetes mellitus with hyperglycemia: Secondary | ICD-10-CM | POA: Diagnosis present

## 2021-03-06 DIAGNOSIS — R4182 Altered mental status, unspecified: Secondary | ICD-10-CM | POA: Diagnosis present

## 2021-03-06 DIAGNOSIS — IMO0002 Reserved for concepts with insufficient information to code with codable children: Secondary | ICD-10-CM | POA: Diagnosis present

## 2021-03-06 DIAGNOSIS — Z7901 Long term (current) use of anticoagulants: Secondary | ICD-10-CM

## 2021-03-06 DIAGNOSIS — C649 Malignant neoplasm of unspecified kidney, except renal pelvis: Secondary | ICD-10-CM | POA: Diagnosis present

## 2021-03-06 DIAGNOSIS — Z794 Long term (current) use of insulin: Secondary | ICD-10-CM

## 2021-03-06 DIAGNOSIS — N3001 Acute cystitis with hematuria: Secondary | ICD-10-CM | POA: Diagnosis not present

## 2021-03-06 DIAGNOSIS — R531 Weakness: Secondary | ICD-10-CM

## 2021-03-06 DIAGNOSIS — E785 Hyperlipidemia, unspecified: Secondary | ICD-10-CM | POA: Diagnosis present

## 2021-03-06 DIAGNOSIS — R1310 Dysphagia, unspecified: Secondary | ICD-10-CM | POA: Diagnosis present

## 2021-03-06 DIAGNOSIS — E119 Type 2 diabetes mellitus without complications: Secondary | ICD-10-CM

## 2021-03-06 DIAGNOSIS — Z515 Encounter for palliative care: Secondary | ICD-10-CM

## 2021-03-06 DIAGNOSIS — Z9221 Personal history of antineoplastic chemotherapy: Secondary | ICD-10-CM

## 2021-03-06 DIAGNOSIS — L304 Erythema intertrigo: Secondary | ICD-10-CM | POA: Diagnosis present

## 2021-03-06 DIAGNOSIS — R7989 Other specified abnormal findings of blood chemistry: Secondary | ICD-10-CM

## 2021-03-06 DIAGNOSIS — Z9884 Bariatric surgery status: Secondary | ICD-10-CM

## 2021-03-06 DIAGNOSIS — G9340 Encephalopathy, unspecified: Secondary | ICD-10-CM | POA: Diagnosis not present

## 2021-03-06 DIAGNOSIS — B954 Other streptococcus as the cause of diseases classified elsewhere: Secondary | ICD-10-CM | POA: Diagnosis present

## 2021-03-06 DIAGNOSIS — R0602 Shortness of breath: Secondary | ICD-10-CM

## 2021-03-06 DIAGNOSIS — Z79899 Other long term (current) drug therapy: Secondary | ICD-10-CM

## 2021-03-06 DIAGNOSIS — R52 Pain, unspecified: Secondary | ICD-10-CM

## 2021-03-06 DIAGNOSIS — I4891 Unspecified atrial fibrillation: Secondary | ICD-10-CM

## 2021-03-06 DIAGNOSIS — I4819 Other persistent atrial fibrillation: Secondary | ICD-10-CM

## 2021-03-06 DIAGNOSIS — I480 Paroxysmal atrial fibrillation: Secondary | ICD-10-CM

## 2021-03-06 DIAGNOSIS — Z20822 Contact with and (suspected) exposure to covid-19: Secondary | ICD-10-CM | POA: Diagnosis present

## 2021-03-06 DIAGNOSIS — K5900 Constipation, unspecified: Secondary | ICD-10-CM | POA: Diagnosis present

## 2021-03-06 DIAGNOSIS — I482 Chronic atrial fibrillation, unspecified: Secondary | ICD-10-CM | POA: Diagnosis present

## 2021-03-06 DIAGNOSIS — Z9109 Other allergy status, other than to drugs and biological substances: Secondary | ICD-10-CM

## 2021-03-06 DIAGNOSIS — Z87442 Personal history of urinary calculi: Secondary | ICD-10-CM

## 2021-03-06 DIAGNOSIS — R410 Disorientation, unspecified: Secondary | ICD-10-CM | POA: Diagnosis present

## 2021-03-06 DIAGNOSIS — Z803 Family history of malignant neoplasm of breast: Secondary | ICD-10-CM

## 2021-03-06 DIAGNOSIS — F32A Depression, unspecified: Secondary | ICD-10-CM | POA: Diagnosis present

## 2021-03-06 DIAGNOSIS — F05 Delirium due to known physiological condition: Secondary | ICD-10-CM | POA: Diagnosis present

## 2021-03-06 DIAGNOSIS — Z85118 Personal history of other malignant neoplasm of bronchus and lung: Secondary | ICD-10-CM

## 2021-03-06 DIAGNOSIS — I10 Essential (primary) hypertension: Secondary | ICD-10-CM | POA: Diagnosis present

## 2021-03-06 DIAGNOSIS — G9341 Metabolic encephalopathy: Secondary | ICD-10-CM | POA: Diagnosis present

## 2021-03-06 DIAGNOSIS — C641 Malignant neoplasm of right kidney, except renal pelvis: Secondary | ICD-10-CM | POA: Diagnosis present

## 2021-03-06 LAB — URINE DRUG SCREEN, QUALITATIVE (ARMC ONLY)
Amphetamines, Ur Screen: NOT DETECTED
Barbiturates, Ur Screen: NOT DETECTED
Benzodiazepine, Ur Scrn: NOT DETECTED
Cannabinoid 50 Ng, Ur ~~LOC~~: NOT DETECTED
Cocaine Metabolite,Ur ~~LOC~~: NOT DETECTED
MDMA (Ecstasy)Ur Screen: NOT DETECTED
Methadone Scn, Ur: NOT DETECTED
Opiate, Ur Screen: NOT DETECTED
Phencyclidine (PCP) Ur S: NOT DETECTED
Tricyclic, Ur Screen: NOT DETECTED

## 2021-03-06 LAB — TSH: TSH: 2.211 u[IU]/mL (ref 0.350–4.500)

## 2021-03-06 LAB — BASIC METABOLIC PANEL
Anion gap: 6 (ref 5–15)
BUN: 13 mg/dL (ref 8–23)
CO2: 26 mmol/L (ref 22–32)
Calcium: 9.9 mg/dL (ref 8.9–10.3)
Chloride: 107 mmol/L (ref 98–111)
Creatinine, Ser: 0.5 mg/dL (ref 0.44–1.00)
GFR, Estimated: >60 mL/min (ref >60)
Glucose, Bld: 282 mg/dL — ABNORMAL HIGH (ref 70–99)
Potassium: 4.1 mmol/L (ref 3.5–5.1)
Sodium: 139 mmol/L (ref 135–145)

## 2021-03-06 LAB — CBC WITH DIFFERENTIAL/PLATELET
Abs Immature Granulocytes: 0.04 10*3/uL (ref 0.00–0.07)
Basophils Absolute: 0 10*3/uL (ref 0.0–0.1)
Basophils Relative: 0 %
Eosinophils Absolute: 0.1 10*3/uL (ref 0.0–0.5)
Eosinophils Relative: 2 %
HCT: 39.3 % (ref 36.0–46.0)
Hemoglobin: 12.9 g/dL (ref 12.0–15.0)
Immature Granulocytes: 1 %
Lymphocytes Relative: 24 %
Lymphs Abs: 1.4 10*3/uL (ref 0.7–4.0)
MCH: 27.2 pg (ref 26.0–34.0)
MCHC: 32.8 g/dL (ref 30.0–36.0)
MCV: 82.7 fL (ref 80.0–100.0)
Monocytes Absolute: 0.5 10*3/uL (ref 0.1–1.0)
Monocytes Relative: 9 %
Neutro Abs: 3.7 10*3/uL (ref 1.7–7.7)
Neutrophils Relative %: 64 %
Platelets: 213 10*3/uL (ref 150–400)
RBC: 4.75 MIL/uL (ref 3.87–5.11)
RDW: 14 % (ref 11.5–15.5)
WBC: 5.7 10*3/uL (ref 4.0–10.5)
nRBC: 0 % (ref 0.0–0.2)

## 2021-03-06 LAB — URINALYSIS, COMPLETE (UACMP) WITH MICROSCOPIC
Bilirubin Urine: NEGATIVE
Glucose, UA: >=500 mg/dL — AB
Hgb urine dipstick: NEGATIVE
Ketones, ur: 5 mg/dL — AB
Leukocytes,Ua: NEGATIVE
Nitrite: NEGATIVE
Protein, ur: NEGATIVE mg/dL
Specific Gravity, Urine: 1.029 (ref 1.005–1.030)
pH: 6 (ref 5.0–8.0)

## 2021-03-06 LAB — ETHANOL: Alcohol, Ethyl (B): <10 mg/dL (ref <10)

## 2021-03-06 LAB — CBG MONITORING, ED: Glucose-Capillary: 249 mg/dL — ABNORMAL HIGH (ref 70–99)

## 2021-03-06 LAB — RESP PANEL BY RT-PCR (FLU A&B, COVID) ARPGX2
Influenza A by PCR: NEGATIVE
Influenza B by PCR: NEGATIVE
SARS Coronavirus 2 by RT PCR: NEGATIVE

## 2021-03-06 LAB — AMMONIA: Ammonia: 50 umol/L — ABNORMAL HIGH (ref 9–35)

## 2021-03-06 MED ORDER — IOHEXOL 9 MG/ML PO SOLN: 500.0000 mL | ORAL | Status: AC

## 2021-03-06 MED ORDER — LORAZEPAM 2 MG/ML IJ SOLN
1.0000 mg | Freq: Once | INTRAMUSCULAR | Status: AC | PRN
  Administered 2021-03-07: 1 mg via INTRAVENOUS
  Filled 2021-03-06: qty 1

## 2021-03-06 MED ORDER — AXITINIB 5 MG PO TABS: 5.0000 mg | ORAL_TABLET | Freq: Two times a day (BID) | ORAL | Status: DC

## 2021-03-06 MED ORDER — APIXABAN 5 MG PO TABS
5.0000 mg | ORAL_TABLET | Freq: Two times a day (BID) | ORAL | Status: DC
  Administered 2021-03-06 – 2021-03-11 (×6): 5 mg via ORAL
  Filled 2021-03-06 (×9): qty 1

## 2021-03-06 MED ORDER — ACETAMINOPHEN 325 MG PO TABS
650.0000 mg | ORAL_TABLET | ORAL | Status: DC | PRN
  Administered 2021-03-11 – 2021-03-15 (×4): 650 mg via ORAL
  Filled 2021-03-06 (×5): qty 2

## 2021-03-06 MED ORDER — STROKE: EARLY STAGES OF RECOVERY BOOK: Freq: Once | Status: DC

## 2021-03-06 MED ORDER — INSULIN ASPART 100 UNIT/ML IJ SOLN
0.0000 [IU] | Freq: Three times a day (TID) | INTRAMUSCULAR | Status: DC
  Administered 2021-03-07 – 2021-03-08 (×4): 5 [IU] via SUBCUTANEOUS
  Administered 2021-03-08: 8 [IU] via SUBCUTANEOUS
  Administered 2021-03-08 – 2021-03-09 (×3): 5 [IU] via SUBCUTANEOUS
  Administered 2021-03-10 (×2): 3 [IU] via SUBCUTANEOUS
  Administered 2021-03-11 (×2): 2 [IU] via SUBCUTANEOUS
  Administered 2021-03-11: 3 [IU] via SUBCUTANEOUS
  Administered 2021-03-12 (×2): 11 [IU] via SUBCUTANEOUS
  Administered 2021-03-12: 5 [IU] via SUBCUTANEOUS
  Administered 2021-03-13: 3 [IU] via SUBCUTANEOUS
  Administered 2021-03-13: 5 [IU] via SUBCUTANEOUS
  Administered 2021-03-13: 3 [IU] via SUBCUTANEOUS
  Administered 2021-03-14: 11 [IU] via SUBCUTANEOUS
  Administered 2021-03-15 (×2): 2 [IU] via SUBCUTANEOUS
  Administered 2021-03-15: 5 [IU] via SUBCUTANEOUS
  Administered 2021-03-16 (×2): 3 [IU] via SUBCUTANEOUS
  Filled 2021-03-06 (×25): qty 1

## 2021-03-06 MED ORDER — METOPROLOL SUCCINATE ER 50 MG PO TB24
50.0000 mg | ORAL_TABLET | Freq: Every day | ORAL | Status: DC
  Administered 2021-03-07 – 2021-03-16 (×8): 50 mg via ORAL
  Filled 2021-03-06 (×8): qty 1

## 2021-03-06 MED ORDER — INSULIN ASPART 100 UNIT/ML IJ SOLN
0.0000 [IU] | Freq: Every day | INTRAMUSCULAR | Status: DC
  Administered 2021-03-06: 2 [IU] via SUBCUTANEOUS
  Administered 2021-03-08: 3 [IU] via SUBCUTANEOUS
  Administered 2021-03-09 – 2021-03-10 (×2): 2 [IU] via SUBCUTANEOUS
  Administered 2021-03-11: 3 [IU] via SUBCUTANEOUS
  Administered 2021-03-12: 2 [IU] via SUBCUTANEOUS
  Filled 2021-03-06 (×6): qty 1

## 2021-03-06 MED ORDER — SERTRALINE HCL 50 MG PO TABS
25.0000 mg | ORAL_TABLET | Freq: Every day | ORAL | Status: DC
  Administered 2021-03-08 – 2021-03-16 (×7): 25 mg via ORAL
  Filled 2021-03-06 (×8): qty 1

## 2021-03-06 MED ORDER — SENNOSIDES-DOCUSATE SODIUM 8.6-50 MG PO TABS: 1.0000 | ORAL_TABLET | Freq: Every evening | ORAL | Status: DC | PRN

## 2021-03-06 MED ORDER — LACTULOSE 10 GM/15ML PO SOLN
20.0000 g | Freq: Once | ORAL | Status: AC
  Administered 2021-03-08: 20 g via ORAL
  Filled 2021-03-06: qty 30

## 2021-03-06 MED ORDER — ACETAMINOPHEN 160 MG/5ML PO SOLN
650.0000 mg | ORAL | Status: DC | PRN
  Filled 2021-03-06: qty 20.3

## 2021-03-06 MED ORDER — ACETAMINOPHEN 650 MG RE SUPP: 650.0000 mg | RECTAL | Status: DC | PRN

## 2021-03-06 MED ORDER — INSULIN GLARGINE 100 UNIT/ML ~~LOC~~ SOLN
27.0000 [IU] | Freq: Two times a day (BID) | SUBCUTANEOUS | Status: DC
  Administered 2021-03-07 – 2021-03-11 (×9): 27 [IU] via SUBCUTANEOUS
  Filled 2021-03-06 (×10): qty 0.27

## 2021-03-06 MED ORDER — NYSTATIN 100000 UNIT/GM EX POWD
Freq: Three times a day (TID) | CUTANEOUS | Status: DC
  Filled 2021-03-06: qty 15

## 2021-03-06 NOTE — H&P (Signed)
History and Physical   Barbara Frank TFT:[id number] DOB: 21-Sep-1945 DOA: 03/06/2021  PCP: Sheral Apley  Outpatient Specialists: Dr. Tasia Catchings, medical oncology Patient coming from: [facility]  I have personally briefly reviewed patient's old medical records in [place].  Chief Concern: confusion  HPI: Barbara Frank is a 75 y.o. female with medical history significant for renal cell carcinoma on Keytruda and axitinib, hypertension, hyperlipidemia, depression, insulin-dependent diabetes mellitus, atrial fibrillation, presents to the emergency department for chief concerns of confusion.  Per niece and chart review, patient has been confused for the last 2 days.  At bedside she is able to tell me her full name, her age, year is 89, and that she is in the hospital.  She states that she does not know which hospital she is in.  She states that she called EMS because she is feeling more confused.  She denies changes to her diet.  She also endorses compliance with medication.  She denies dysuria, chest pain, shortness of breath, nausea, vomiting, difficulty swallowing, vision changes.  She states her last bowel movememt was 4 days ago.  She states this is unusual for her.  Patient provides H&P however her responses are very slowed.  Social history: Patient lives at [facility].  ROS: Constitutional: no weight change, no fever ENT/Mouth: no sore throat, no rhinorrhea Eyes: no eye pain, no vision changes Cardiovascular: no chest pain, no dyspnea,  no edema, no palpitations Respiratory: no cough, no sputum, no wheezing Gastrointestinal: no nausea, no vomiting, no diarrhea, + constipation Genitourinary: no urinary incontinence, no dysuria, no hematuria Musculoskeletal: no arthralgias, no myalgias Skin: no skin lesions, no pruritus, Neuro: + weakness, no loss of consciousness, no syncope Psych: no anxiety, no depression, + decrease appetite Heme/Lymph: no bruising, no bleeding  ED  Course: Discussed with ED provider, patient requiring hospitalization for TIA work-up.  Per ED provider, when EMS arrived to series, patient was found to be in A. fib with RVR and patient was given metoprolol 5 mg IV.  This resolved her atrial fibrillation with RVR.  Vitals in the emergency department was remarkable for temperature of 98.3, respiration rate of 18, heart rate 84, blood pressure 140/79, SPO2 of 95% on room air.  Initial CT of the head without contrast was negative.  Assessment/Plan  Principal Problem:   Altered mental status Active Problems:   Atrial fibrillation (HCC)   Depression   Diabetes type 2, uncontrolled (HCC)   Renal cell carcinoma (HCC)   Transient confusion   Intertrigo  # Confusion-I suspect this is secondary to obstipation # Query metabolic cephalopathy - Checking TSH, B12 - Elevated ammonia at 50 in setting of constipation - 1 dose of lactulose 20 g ordered - Ethanol was negative, UDS was negative - CT abdomen and pelvis with contrast ordered  # Stage IV renal cell carcinoma-outpatient follow-up with medical oncology  # Intertrigo-nystatin powder 3 times daily ordered  # Atrial fibrillation with RVR-resumed apixaban 5 mg twice daily, metoprolol succinate 50 mg daily  # Depression-sertraline 25 mg daily  # Insulin-dependent diabetes mellitus-resumed home glargine 27 units twice daily, insulin SSI with at bedtime coverage ordered  Chart reviewed.   DVT prophylaxis: Eliquis 5 mg twice daily Code Status: full code  Diet: Heart healthy/carb modified Family Communication: No Disposition Plan: Pending clinical course Consults called: None at this time Admission status: MedSurg, observation, telemetry  Past Medical History:  Diagnosis Date   A-fib [facility])    Depression    Diabetes  mellitus without complication ([place])    History of kidney stones    Kidney mass    Past Surgical History:  Procedure Laterality Date   GASTRIC BYPASS     HERNIA  REPAIR     TONSILLECTOMY     URETEROSCOPY WITH HOLMIUM LASER LITHOTRIPSY Left    Social History:  reports that she has never smoked. She has never used smokeless tobacco. She reports previous alcohol use. She reports previous drug use.  Allergies  Allergen Reactions   Kiwi Extract    Family History  Problem Relation Age of Onset   Breast cancer Mother    Bladder Cancer Neg Hx    Kidney cancer Neg Hx    Prostate cancer Neg Hx    Family history: Family history reviewed and not pertinent  Prior to Admission medications   Medication Sig Start Date End Date Taking? Authorizing Provider  metFORMIN (GLUCOPHAGE) 1000 MG tablet Take 1,000 mg by mouth 2 (two) times daily. 03/04/21  Yes [provider]  acetaminophen (TYLENOL) 650 MG CR tablet Take 650 mg by mouth every 4 (four) hours as needed for pain or fever. Patient not taking: Reported on 02/27/2021    [provider]  axitinib (INLYTA) 5 MG tablet Take 1 tablet (5 mg total) by mouth 2 (two) times daily. 01/30/21   [name], MD  cetirizine (ZYRTEC) 10 MG tablet Take 10 mg by mouth daily.    [provider]  cyanocobalamin (,VITAMIN B-12,) 1000 MCG/ML injection Inject into the muscle. 09/24/20   [provider]  ELIQUIS 5 MG TABS tablet Take 5 mg by mouth 2 (two) times daily. 08/18/20   [provider]  insulin glargine (LANTUS) 100 UNIT/ML injection Inject 27 Units into the skin 2 (two) times daily. 27 u in the morning and 27 u at night    [provider]  metoprolol succinate (TOPROL-XL) 50 MG 24 hr tablet Take 50 mg by mouth daily. Take with or immediately following a meal.    [provider]  Psyllium 48.57 % POWD Take by mouth. Takes 1 QD prn    [provider]  sertraline (ZOLOFT) 25 MG tablet Take 25 mg by mouth daily.    [provider]   Physical Exam: Vitals:   03/06/21 1800 03/06/21 1830 03/06/21 1900 03/06/21 1930  BP: 134/82 (!) 160/90 (!) 164/85  (!) 158/94  Pulse: 86 89 90 91  Resp: (!) 21 11 17 16   Temp:      TempSrc:      SpO2: 95% 95% 96% 93%  Weight:      Height:       Constitutional: appears age-appropriate, NAD, calm, comfortable Eyes: PERRL, lids and conjunctivae normal ENMT: Mucous membranes are moist. Posterior pharynx clear of any exudate or lesions. Age-appropriate dentition. Hearing appropriate Neck: normal, supple, no masses, no thyromegaly Respiratory: clear to auscultation bilaterally, no wheezing, no crackles. Normal respiratory effort. No accessory muscle use.  Cardiovascular: Regular rate and rhythm, no murmurs / rubs / gallops. No extremity edema. 2+ pedal pulses. No carotid bruits.  Abdomen: Obese abdomen with pannus, mild tenderness, no masses palpated, no hepatosplenomegaly. Bowel sounds positive.  Belly appears constipated Musculoskeletal: no clubbing / cyanosis. No joint deformity upper and lower extremities. Good ROM, no contractures, no atrophy. Normal muscle tone.  Skin: no rashes, lesions, ulcers. No induration.  Bilateral groin intertrigo, pannus with intertrigo Neurologic: Sensation intact. Strength 5/5 in all 4.  Psychiatric: Normal judgment and insight. Alert and  oriented x 3.  Depressed mood.  Slow to respond.  EKG: independently reviewed, showing atrial fibrillation with rate of 91, right bundle branch block, QTC 475  Chest x-ray on Admission: I personally reviewed and I agree with radiologist reading as below.  CT Head Wo Contrast  Result Date: 03/06/2021 CLINICAL DATA:  Altered mental status. EXAM: CT HEAD WITHOUT CONTRAST TECHNIQUE: Contiguous axial images were obtained from the base of the skull through the vertex without intravenous contrast. COMPARISON:  None. FINDINGS: Brain: There is mild cerebral atrophy with widening of the extra-axial spaces and ventricular dilatation. There are areas of decreased attenuation within the white matter tracts of the supratentorial brain, consistent with  microvascular disease changes. Vascular: No hyperdense vessel or unexpected calcification. Skull: Normal. Negative for fracture or focal lesion. Sinuses/Orbits: No acute finding. Other: None. IMPRESSION: 1. Generalized cerebral atrophy. 2. No acute intracranial abnormality. Electronically Signed   By: Virgina Norfolk M.D.   On: 03/06/2021 17:23   MR BRAIN WO CONTRAST  Result Date: 03/06/2021 CLINICAL DATA:  Encephalopathy EXAM: MRI HEAD WITHOUT CONTRAST TECHNIQUE: Multiplanar, multiecho pulse sequences of the brain and surrounding structures were obtained without intravenous contrast. COMPARISON:  None. FINDINGS: Brain: No acute infarct, mass effect or extra-axial collection. No acute or chronic hemorrhage. There is multifocal hyperintense T2-weighted signal within the white matter. Parenchymal volume and CSF spaces are normal. The midline structures are normal. Vascular: Major flow voids are preserved. Skull and upper cervical spine: Normal calvarium and skull base. Visualized upper cervical spine and soft tissues are normal. Sinuses/Orbits:No paranasal sinus fluid levels or advanced mucosal thickening. No mastoid or middle ear effusion. Normal orbits. IMPRESSION: 1. No acute intracranial abnormality. 2. Findings of chronic small vessel ischemia. Electronically Signed   By: Ulyses Jarred M.D.   On: 03/06/2021 22:23   DG Chest Port 1 View  Result Date: 03/06/2021 CLINICAL DATA:  Shortness of breath.  Confusion. EXAM: PORTABLE CHEST 1 VIEW COMPARISON:  CT chest 10/03/2020 FINDINGS: Shallow inspiration. Heart size and pulmonary vascularity are normal for technique. Lungs are clear. No pleural effusions. No pneumothorax. Shuttle clips in the upper abdomen. IMPRESSION: No active disease. Electronically Signed   By: Lucienne Capers M.D.   On: 03/06/2021 21:05    Labs on Admission: I have personally reviewed following labs  CBC: Recent Labs  Lab 03/06/21 1629  WBC 5.7  NEUTROABS 3.7  HGB 12.9  HCT 39.3   MCV 82.7  PLT 956   Basic Metabolic Panel: Recent Labs  Lab 03/06/21 1629  NA 139  K 4.1  CL 107  CO2 26  GLUCOSE 282*  BUN 13  CREATININE 0.50  CALCIUM 9.9   GFR: Estimated Creatinine Clearance: 61.8 mL/min (by C-G formula based on SCr of 0.5 mg/dL).  Recent Labs  Lab 03/06/21 1629  AMMONIA 50*   Thyroid Function Tests: Recent Labs    03/06/21 1629  TSH 2.211   Urine analysis:    Component Value Date/Time   COLORURINE YELLOW (A) 03/06/2021 1726   APPEARANCEUR HAZY (A) 03/06/2021 1726   LABSPEC 1.029 03/06/2021 1726   PHURINE 6.0 03/06/2021 1726   GLUCOSEU >=500 (A) 03/06/2021 1726   HGBUR NEGATIVE 03/06/2021 1726   BILIRUBINUR NEGATIVE 03/06/2021 1726   KETONESUR 5 (A) 03/06/2021 1726   PROTEINUR NEGATIVE 03/06/2021 1726   NITRITE NEGATIVE 03/06/2021 1726   LEUKOCYTESUR NEGATIVE 03/06/2021 1726   Dr. Tobie Poet [facility]  If 7PM-7AM, please contact overnight-coverage provider If 7AM-7PM, please contact day coverage provider [email or web address]  03/06/2021, 11:05 PM

## 2021-03-06 NOTE — ED Provider Notes (Signed)
[facility] Emergency Department Provider Note  Time seen: 4:56 PM  I have reviewed the triage vital signs and the nursing notes.   HISTORY  Chief Complaint Weakness   HPI Barbara Frank is a 75 y.o. female with a past medical history of atrial fibrillation, depression, diabetes, presents to the emergency department for confusion.  Patient is coming from [facility] facility for feeling confused.  Patient is able to answer most questions although is very slow to respond.  Patient is alert she is oriented to person and place but not time.  Per report patient was able to go shopping yesterday using her walker is normal however today she is unable to stand on her own.  Patient was found to be in A. fib RVR and was given 5 mg metoprolol by EMS.  Currently patient's heart rate around 84 bpm but appears consistent with A. fib.  Patient denies any headache chest pain abdominal pain vomiting or diarrhea.  No fever or cough.  No current complaints but appears very slow with her responses.  Past Medical History:  Diagnosis Date   A-fib [facility])    Depression    Diabetes mellitus without complication ([place])    History of kidney stones    Kidney mass     Patient Active Problem List   Diagnosis Date Noted   Encounter for antineoplastic chemotherapy 01/08/2021   Encounter for antineoplastic immunotherapy 01/08/2021   Goals of care, counseling/discussion 10/17/2020   Hypercalcemia 10/17/2020   Chest mass 10/17/2020   Atrial fibrillation ([place]) 09/26/2020   Depression 09/26/2020   Diabetes type 2, uncontrolled ([place]) 09/26/2020   Hypokalemia 09/26/2020   Renal cell carcinoma ([place]) 09/26/2020   Transient confusion 09/26/2020   Ureteral stone 09/26/2020    Past Surgical History:  Procedure Laterality Date   GASTRIC BYPASS     HERNIA REPAIR     TONSILLECTOMY     URETEROSCOPY WITH HOLMIUM LASER LITHOTRIPSY Left     Prior to Admission medications   Medication Sig  Start Date End Date Taking? Authorizing Provider  acetaminophen (TYLENOL) 650 MG CR tablet Take 650 mg by mouth every 4 (four) hours as needed for pain or fever. Patient not taking: Reported on 02/27/2021    [provider]  axitinib (INLYTA) 5 MG tablet Take 1 tablet (5 mg total) by mouth 2 (two) times daily. 01/30/21   [name], MD  cetirizine (ZYRTEC) 10 MG tablet Take 10 mg by mouth daily.    [provider]  cyanocobalamin (,VITAMIN B-12,) 1000 MCG/ML injection Inject into the muscle. 09/24/20   [provider]  ELIQUIS 5 MG TABS tablet Take 5 mg by mouth 2 (two) times daily. 08/18/20   [provider]  insulin glargine (LANTUS) 100 UNIT/ML injection Inject 27 Units into the skin 2 (two) times daily. 27 u in the morning and 27 u at night    [provider]  metFORMIN (GLUCOPHAGE) 850 MG tablet Take 1,000 mg by mouth 2 (two) times daily with a meal.    [provider]  metoprolol succinate (TOPROL-XL) 50 MG 24 hr tablet Take 50 mg by mouth daily. Take with or immediately following a meal.    [provider]  Psyllium 48.57 % POWD Take by mouth. Takes 1 QD prn    [provider]  sertraline (ZOLOFT) 25 MG tablet Take 25 mg by mouth daily.    [provider]    Allergies  Allergen Reactions  Kiwi Extract     Family History  Problem Relation Age of Onset   Breast cancer Mother    Bladder Cancer Neg Hx    Kidney cancer Neg Hx    Prostate cancer Neg Hx     Social History Social History   Tobacco Use   Smoking status: Never   Smokeless tobacco: Never  Vaping Use   Vaping Use: Never used  Substance Use Topics   Alcohol use: Not Currently   Drug use: Not Currently    Review of Systems Unable to obtain an adequate/accurate review of systems secondary to confusion ____________________________________________   PHYSICAL EXAM:  VITAL SIGNS: ED Triage Vitals  Enc Vitals Group     BP 03/06/21 1627  140/79     Pulse Rate 03/06/21 1627 84     Resp 03/06/21 1627 18     Temp 03/06/21 1627 98.3 F (36.8 C)     Temp Source 03/06/21 1627 Oral     SpO2 03/06/21 1627 95 %     Weight 03/06/21 1623 184 lb 8 oz (83.7 kg)     Height 03/06/21 1623 5\' 2"  (1.575 m)     Head Circumference --      Peak Flow --      Pain Score 03/06/21 1623 0     Pain Loc --      Pain Edu? --      Excl. in [place]? --    Constitutional: Awake alert but slow responses.  Oriented to person and place only not time. Eyes: Normal exam ENT      Head: Normocephalic and atraumati      Mouth/Throat: Mucous membranes are moist. Cardiovascular: Irregular rhythm rate around 80 bpm Respiratory: Normal respiratory effort without tachypnea nor retractions. Breath sounds are clear  Gastrointestinal: Soft and nontender. No distention Musculoskeletal: Nontender with normal range of motion in all extremities.  Neurologic: Slowed responses.  Patient has equal grip strengths, no pronator drift.  No obvious cranial nerve deficits.  4/5 strength in all extremities. Skin:  Skin is warm, dry and intact.  Psychiatric: Slowed responses.  ____________________________________________    EKG  EKG viewed and interpreted by myself shows atrial fibrillation at 91 bpm with a slightly widened QRS with a normal axis, normal intervals with nonspecific ST changes.  ____________________________________________    RADIOLOGY  CT head is negative  ____________________________________________   INITIAL IMPRESSION / ASSESSMENT AND PLAN / ED COURSE  Pertinent labs & imaging results that were available during my care of the patient were reviewed by me and considered in my medical decision making (see chart for details).   Patient presents to the emergency department for confusion.  In reviewing the patient's record she has report of transient confusion in the past.  However given her atrial fibrillation she is also at a higher risk for stroke.   Patient appears to have intact neurologic function besides slower responses and she is disoriented to time but is not entirely clear what her baseline is.  Given this we will check labs, urinalysis obtain a CT scan and continue to closely monitor.  Patient agreeable to plan of care.  Patient's work-up is essentially negative.  However nursing home states this is a significant change from the patient's baseline.  We will admit to the hospital service for further work-up and treatment patient would likely require an MRI to rule out CVA.  Barbara Frank was evaluated in Emergency Department on 03/06/2021 for the symptoms described in the history  of present illness. She was evaluated in the context of the global COVID-19 pandemic, which necessitated consideration that the patient might be at risk for infection with the SARS-CoV-2 virus that causes COVID-19. Institutional protocols and algorithms that pertain to the evaluation of patients at risk for COVID-19 are in a state of rapid change based on information released by regulatory bodies including the CDC and federal and state organizations. These policies and algorithms were followed during the patient's care in the ED.  ____________________________________________   FINAL CLINICAL IMPRESSION(S) / ED DIAGNOSES  Confusion Delirium   Harvest Dark, MD 03/06/21 2010

## 2021-03-06 NOTE — ED Notes (Signed)
This RN and Vet, NT changed pt and placed purewick on pt.

## 2021-03-06 NOTE — ED Triage Notes (Signed)
[facility] via [facility] from [facility] for "feeling confused". Per EMS pt is answering all questions appropriately but slower than normal. Pt is on New medication for renal cancer (Inlyta), pt has been on med for 2 weeks. Pt was able to go out shopping and use walker per her normal yesterday, however, today pt is not able to stand on her own. Pt was in A Fib RVR and was given 5 mg of IV Metoprolol by EMS PTA. Pt was given 400 CC of NS in route. Pts CBG was 280 with hx/o DM.

## 2021-03-06 NOTE — ED Notes (Signed)
This RN encouraged pt to take medication and pt refused. This RN explained what medication she needed to take and pt refused. After refusing, pt stated "im going to tell them you threatened me." This RN notified Megan, RN and reassured pt that we were just trying to help her get better. This RN contacted pt niece, [name], and explained to her what happened and to try to encourage pt to cooperate. [name] stated that pt has fabricated "threats" before. Pt on phone with [name] at this time.

## 2021-03-07 ENCOUNTER — Observation Stay: Payer: Medicare PPO

## 2021-03-07 ENCOUNTER — Encounter: Payer: Self-pay | Admitting: Internal Medicine

## 2021-03-07 DIAGNOSIS — G9340 Encephalopathy, unspecified: Secondary | ICD-10-CM | POA: Diagnosis not present

## 2021-03-07 DIAGNOSIS — I480 Paroxysmal atrial fibrillation: Secondary | ICD-10-CM | POA: Diagnosis not present

## 2021-03-07 DIAGNOSIS — E1165 Type 2 diabetes mellitus with hyperglycemia: Secondary | ICD-10-CM | POA: Diagnosis not present

## 2021-03-07 LAB — CBG MONITORING, ED
Glucose-Capillary: 245 mg/dL — ABNORMAL HIGH (ref 70–99)
Glucose-Capillary: 220 mg/dL — ABNORMAL HIGH (ref 70–99)

## 2021-03-07 LAB — LIPID PANEL
Cholesterol: 128 mg/dL (ref 0–200)
HDL: 36 mg/dL — ABNORMAL LOW (ref >40)
LDL Cholesterol: 78 mg/dL (ref 0–99)
Total CHOL/HDL Ratio: 3.6 RATIO
Triglycerides: 71 mg/dL (ref <150)
VLDL: 14 mg/dL (ref 0–40)

## 2021-03-07 LAB — GLUCOSE, CAPILLARY
Glucose-Capillary: 205 mg/dL — ABNORMAL HIGH (ref 70–99)
Glucose-Capillary: 162 mg/dL — ABNORMAL HIGH (ref 70–99)

## 2021-03-07 LAB — VITAMIN B12: Vitamin B-12: 483 pg/mL (ref 180–914)

## 2021-03-07 LAB — MRSA NEXT GEN BY PCR, NASAL: MRSA by PCR Next Gen: NOT DETECTED

## 2021-03-07 LAB — MAGNESIUM: Magnesium: 1.6 mg/dL — ABNORMAL LOW (ref 1.7–2.4)

## 2021-03-07 MED ORDER — LORAZEPAM 2 MG/ML IJ SOLN
0.5000 mg | Freq: Four times a day (QID) | INTRAMUSCULAR | Status: DC | PRN
  Administered 2021-03-08 – 2021-03-10 (×2): 0.5 mg via INTRAVENOUS
  Filled 2021-03-07 (×4): qty 1

## 2021-03-07 MED ORDER — IOHEXOL 300 MG/ML  SOLN
100.0000 mL | Freq: Once | INTRAMUSCULAR | Status: AC | PRN
  Administered 2021-03-07: 100 mL via INTRAVENOUS
  Filled 2021-03-07: qty 100

## 2021-03-07 MED ORDER — DILTIAZEM HCL 25 MG/5ML IV SOLN
15.0000 mg | Freq: Once | INTRAVENOUS | Status: AC
  Administered 2021-03-07: 15 mg via INTRAVENOUS
  Filled 2021-03-07: qty 5

## 2021-03-07 MED ORDER — METOPROLOL TARTRATE 5 MG/5ML IV SOLN: 5.0000 mg | Freq: Three times a day (TID) | INTRAVENOUS | Status: DC

## 2021-03-07 MED ORDER — DILTIAZEM LOAD VIA INFUSION: 15.0000 mg | Freq: Once | INTRAVENOUS | Status: DC

## 2021-03-07 MED ORDER — DILTIAZEM HCL-DEXTROSE 125-5 MG/125ML-% IV SOLN (PREMIX)
5.0000 mg/h | INTRAVENOUS | Status: DC
  Administered 2021-03-07 – 2021-03-08 (×2): 5 mg/h via INTRAVENOUS
  Administered 2021-03-08: 10 mg/h via INTRAVENOUS
  Administered 2021-03-10: 5 mg/h via INTRAVENOUS
  Administered 2021-03-10 (×2): 15 mg/h via INTRAVENOUS
  Administered 2021-03-10: 12.5 mg/h via INTRAVENOUS
  Administered 2021-03-10 – 2021-03-11 (×2): 15 mg/h via INTRAVENOUS
  Administered 2021-03-11: 7.5 mg/h via INTRAVENOUS
  Administered 2021-03-12: 10 mg/h via INTRAVENOUS
  Administered 2021-03-12: 7.5 mg/h via INTRAVENOUS
  Filled 2021-03-07 (×9): qty 125

## 2021-03-07 MED ORDER — HALOPERIDOL LACTATE 5 MG/ML IJ SOLN
5.0000 mg | Freq: Four times a day (QID) | INTRAMUSCULAR | Status: DC | PRN
  Administered 2021-03-07 – 2021-03-14 (×3): 5 mg via INTRAMUSCULAR
  Filled 2021-03-07 (×3): qty 1

## 2021-03-07 MED ORDER — MAGNESIUM SULFATE 2 GM/50ML IV SOLN
2.0000 g | Freq: Once | INTRAVENOUS | Status: AC
  Administered 2021-03-07: 2 g via INTRAVENOUS
  Filled 2021-03-07: qty 50

## 2021-03-07 MED ORDER — METOPROLOL TARTRATE 5 MG/5ML IV SOLN
5.0000 mg | Freq: Three times a day (TID) | INTRAVENOUS | Status: DC | PRN
  Administered 2021-03-07 – 2021-03-10 (×2): 5 mg via INTRAVENOUS
  Filled 2021-03-07 (×2): qty 5

## 2021-03-07 NOTE — ED Notes (Signed)
Pt's family present at bedside.

## 2021-03-07 NOTE — ED Notes (Signed)
Attempted to reposition pt in bed, she states "don't move me, my back hurts when I move". Pt inquiring about breakfast, states "I am diabetic and haven't eaten in 24 hours." Will assess bgl and call dietary for breakfast tray.  Stretcher locked in low position with side rails up x2, call light in reach. Fall precautions in use.

## 2021-03-07 NOTE — ED Notes (Signed)
Pt given prn medication, see mar. Mitts applied to bilateral hands. ED staff remains at the bedside.

## 2021-03-07 NOTE — ED Notes (Signed)
Pt refusing medications at this time. Pt appears agitated and repeats the same statements, "I want to see my doctor" "why am I here" "where I am I?"   Minor peri care performed with brief change, pt attempting to put her nails into staff members arms while staff attempting to provide this care. Barrier cream and nystatin powder applied due to excoriated skin under abdominal skin folds and suprapubic area.  Pt is not redirectable at this time, appears to be able to calm herself down when staff exit room. Stretcher locked in low position with call light in reach, side rails up x2. High fall precautions in place.

## 2021-03-07 NOTE — Progress Notes (Addendum)
PROGRESS NOTE    Barbara Frank  YNW:[id number] DOB: 06-30-1946 DOA: 03/06/2021 PCP: Sheral Apley    Assessment & Plan:   Principal Problem:   Altered mental status Active Problems:   Atrial fibrillation ([place])   Depression   Diabetes type 2, uncontrolled ([place])   Renal cell carcinoma ([place])   Transient confusion   Intertrigo   Encephalopathy   Acute metabolic encephalopathy: etiology unclear, possibly delirium vs dementia vs infection. Very agitated & pulling out lines, mittens were placed. Urine drug screen was neg. Ethanol was neg. CT head shows generalized cerebral atrophy & no acute intracranial abnormalities. MRI brain shows no acute intracranial abnormalities. Urine cx ordered. Mental status if far from baseline as per pt's niece. Haldol, ativan prn.   Stage IV renal cell carcinoma: outpatient follow-up with medical oncology  Intertrigo: continue on nystatin powder    Likely PAF: w/ intermittent RVR. Continue on metoprolol, eliquis. IV cardizem x 1. Started on IV cardizem drip as pt has been refusing po meds   Depression: severity unknown. Continue on home dose of sertraline    DM2: likely poorly controlled. Continue on lantus, SSI w/ accuchecks    DVT prophylaxis: eliquis  Code Status:  full  Family Communication: discussed pt's care w/ pt's niece, [name], and answered her questions  Disposition Plan: depends on PT/OT recs   Level of care: Progressive Cardiac  Status is: Observation  The patient remains OBS appropriate and will d/c before 2 midnights.  Dispo: The patient is from: [facility]               Anticipated d/c is to: [facility]               Patient currently is not medically stable to d/c.   Difficult to place patient : unclear     Consultants:    Procedures:   Antimicrobials:    Subjective: Pt is very confused  Objective: Vitals:   03/07/21 0835 03/07/21 0915 03/07/21 1245 03/07/21 1352  BP: 130/67 (!) 143/88 (!) 101/59    Pulse: (!) 108 97 (!) 105 (!) 128  Resp: (!) 21 (!) 22 18 17   Temp:      TempSrc:      SpO2: 94% 97% 100% 96%  Weight:      Height:       No intake or output data in the 24 hours ending 03/07/21 1610 Filed Weights   03/06/21 1623  Weight: 83.7 kg    Examination:  Pt refused a PE today.    Data Reviewed: I have personally reviewed following labs and imaging studies  CBC: Recent Labs  Lab 03/06/21 1629  WBC 5.7  NEUTROABS 3.7  HGB 12.9  HCT 39.3  MCV 82.7  PLT 657   Basic Metabolic Panel: Recent Labs  Lab 03/06/21 1629 03/07/21 0001  NA 139  --   K 4.1  --   CL 107  --   CO2 26  --   GLUCOSE 282*  --   BUN 13  --   CREATININE 0.50  --   CALCIUM 9.9  --   MG  --  1.6*   GFR: Estimated Creatinine Clearance: 61.8 mL/min (by C-G formula based on SCr of 0.5 mg/dL). Liver Function Tests: No results for input(s): AST, ALT, ALKPHOS, BILITOT, PROT, ALBUMIN in the last 168 hours. No results for input(s): LIPASE, AMYLASE in the last 168 hours. Recent Labs  Lab 03/06/21 1629  AMMONIA 50*   Coagulation  Profile: No results for input(s): INR, PROTIME in the last 168 hours. Cardiac Enzymes: No results for input(s): CKTOTAL, CKMB, CKMBINDEX, TROPONINI in the last 168 hours. BNP (last 3 results) No results for input(s): PROBNP in the last 8760 hours. HbA1C: No results for input(s): HGBA1C in the last 72 hours. CBG: Recent Labs  Lab 03/06/21 2354 03/07/21 0755 03/07/21 1158  GLUCAP 249* 245* 220*   Lipid Profile: Recent Labs    03/07/21 0001  CHOL 128  HDL 36*  LDLCALC 78  TRIG 71  CHOLHDL 3.6   Thyroid Function Tests: Recent Labs    03/06/21 1629  TSH 2.211   Anemia Panel: Recent Labs    03/07/21 0714  VITAMINB12 483   Sepsis Labs: No results for input(s): PROCALCITON, LATICACIDVEN in the last 168 hours.  Recent Results (from the past 240 hour(s))  Resp Panel by RT-PCR (Flu A&B, Covid) Nasopharyngeal Swab     Status: None   Collection  Time: 03/06/21  4:56 PM   Specimen: Nasopharyngeal Swab; Nasopharyngeal(NP) swabs in vial transport medium  Result Value Ref Range Status   SARS Coronavirus 2 by RT PCR NEGATIVE NEGATIVE Final    Comment: (NOTE) SARS-CoV-2 target nucleic acids are NOT DETECTED.  The SARS-CoV-2 RNA is generally detectable in upper respiratory specimens during the acute phase of infection. The lowest concentration of SARS-CoV-2 viral copies this assay can detect is 138 copies/mL. A negative result does not preclude SARS-Cov-2 infection and should not be used as the sole basis for treatment or other patient management decisions. A negative result may occur with  improper specimen collection/handling, submission of specimen other than nasopharyngeal swab, presence of viral mutation(s) within the areas targeted by this assay, and inadequate number of viral copies(<138 copies/mL). A negative result must be combined with clinical observations, patient history, and epidemiological information. The expected result is Negative.  Fact Sheet for Patients:  EntrepreneurPulse.com.au  Fact Sheet for Healthcare Providers:  IncredibleEmployment.be  This test is no t yet approved or cleared by the [place] FDA and  has been authorized for detection and/or diagnosis of SARS-CoV-2 by FDA under an Emergency Use Authorization (EUA). This EUA will remain  in effect (meaning this test can be used) for the duration of the COVID-19 declaration under Section 564(b)(1) of the Act, 21 U.S.C.section 360bbb-3(b)(1), unless the authorization is terminated  or revoked sooner.       Influenza A by PCR NEGATIVE NEGATIVE Final   Influenza B by PCR NEGATIVE NEGATIVE Final    Comment: (NOTE) The Xpert Xpress SARS-CoV-2/FLU/RSV plus assay is intended as an aid in the diagnosis of influenza from Nasopharyngeal swab specimens and should not be used as a sole basis for treatment. Nasal washings  and aspirates are unacceptable for Xpert Xpress SARS-CoV-2/FLU/RSV testing.  Fact Sheet for Patients: EntrepreneurPulse.com.au  Fact Sheet for Healthcare Providers: IncredibleEmployment.be  This test is not yet approved or cleared by the [place] FDA and has been authorized for detection and/or diagnosis of SARS-CoV-2 by FDA under an Emergency Use Authorization (EUA). This EUA will remain in effect (meaning this test can be used) for the duration of the COVID-19 declaration under Section 564(b)(1) of the Act, 21 U.S.C. section 360bbb-3(b)(1), unless the authorization is terminated or revoked.  Performed at [facility], [address], [place], [place] [address]          Radiology Studies: CT Head Wo Contrast  Result Date: 03/06/2021 CLINICAL DATA:  Altered mental status. EXAM: CT HEAD WITHOUT  CONTRAST TECHNIQUE: Contiguous axial images were obtained from the base of the skull through the vertex without intravenous contrast. COMPARISON:  None. FINDINGS: Brain: There is mild cerebral atrophy with widening of the extra-axial spaces and ventricular dilatation. There are areas of decreased attenuation within the white matter tracts of the supratentorial brain, consistent with microvascular disease changes. Vascular: No hyperdense vessel or unexpected calcification. Skull: Normal. Negative for fracture or focal lesion. Sinuses/Orbits: No acute finding. Other: None. IMPRESSION: 1. Generalized cerebral atrophy. 2. No acute intracranial abnormality. Electronically Signed   By: Virgina Norfolk M.D.   On: 03/06/2021 17:23   MR BRAIN WO CONTRAST  Result Date: 03/06/2021 CLINICAL DATA:  Encephalopathy EXAM: MRI HEAD WITHOUT CONTRAST TECHNIQUE: Multiplanar, multiecho pulse sequences of the brain and surrounding structures were obtained without intravenous contrast. COMPARISON:  None. FINDINGS: Brain: No acute infarct, mass effect or extra-axial  collection. No acute or chronic hemorrhage. There is multifocal hyperintense T2-weighted signal within the white matter. Parenchymal volume and CSF spaces are normal. The midline structures are normal. Vascular: Major flow voids are preserved. Skull and upper cervical spine: Normal calvarium and skull base. Visualized upper cervical spine and soft tissues are normal. Sinuses/Orbits:No paranasal sinus fluid levels or advanced mucosal thickening. No mastoid or middle ear effusion. Normal orbits. IMPRESSION: 1. No acute intracranial abnormality. 2. Findings of chronic small vessel ischemia. Electronically Signed   By: Ulyses Jarred M.D.   On: 03/06/2021 22:23   CT ABDOMEN PELVIS W CONTRAST  Result Date: 03/07/2021 CLINICAL DATA:  75 year old with suspected bowel obstruction. Renal cell carcinoma. EXAM: CT ABDOMEN AND PELVIS WITH CONTRAST TECHNIQUE: Multidetector CT imaging of the abdomen and pelvis was performed using the standard protocol following bolus administration of intravenous contrast. CONTRAST:  19mL OMNIPAQUE IOHEXOL 300 MG/ML  SOLN COMPARISON:  CT 10/03/2020 FINDINGS: Lower chest: New 1.5 cm peripheral nodule in the right lower lobe on sequence 5, image 8. 5 mm peripheral nodule in the right lower lobe on image 13 may have been present on the exam from 10/03/2020. No significant pleural fluid. Hepatobiliary: Cholecystectomy. Normal appearance of the liver. No significant biliary dilatation. Pancreas: Unremarkable. No pancreatic ductal dilatation or surrounding inflammatory changes. Spleen: Normal in size without focal abnormality. Adrenals/Urinary Tract: Normal adrenal glands. Normal appearance of the left kidney without hydronephrosis. Normal appearance of the urinary bladder with mild distention. Again noted is a large mass involving the anterior aspect of the right kidney which appears to be involving the right renal sinus and hilum. The right renal mass measures 5.4 x 4.9 x 6.4 cm and previously  measured 5.0 x 4.4 x 6.3 cm. Mild stranding around the right renal lesion that was not present on the previous examination. No evidence for rupture or hemorrhage. Stomach/Bowel: Prior gastric surgery with multiple surgical clips in the upper abdomen. Surgical findings are suggestive for a gastric bypass procedure. No evidence for acute bowel inflammation or obstruction. Vascular/Lymphatic: Normal caliber of the abdominal aorta. Main visceral arteries are patent. Again noted are enlarged right retroperitoneal lymph nodes in the retrocaval region. Index lymph node on sequence 2 image 25 measures 2.0 x 2.3 cm and minimally changed. Right renal mass is abutting the right renal vein but no clear evidence of intravascular involvement. Reproductive: Again noted is a small uterus with peripherally calcified fibroids. No evidence for an adnexal mass. Other: Negative for ascites. Extensive postoperative changes along the anterior abdominal wall. Musculoskeletal: Stable lucency involving the T12 right pedicle region. Mild sclerosis along the left side  of the L1 vertebral body is also stable. No clear evidence for osseous metastatic disease. IMPRESSION: 1. Right renal mass is compatible with a renal cell carcinoma. This renal mass has slightly enlarged since 10/03/2020. The adjacent right retroperitoneal lymphadenopathy has minimally changed. However, there is a new 1.5 cm nodule in the right lower lobe. This is suggestive for new metastatic disease. 2. Mild stranding around the right renal mass without evidence of hemorrhage. 3. 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 No evidence for acute bowel inflammation or obstruction. Electronically Signed   By: Markus Daft M.D.   On: 03/07/2021 10:23   DG Chest Port 1 View  Result Date: 03/06/2021 CLINICAL DATA:  Shortness of breath.  Confusion. EXAM: PORTABLE CHEST 1 VIEW COMPARISON:  CT chest 10/03/2020 FINDINGS: Shallow inspiration. Heart size and  pulmonary vascularity are normal for technique. Lungs are clear. No pleural effusions. No pneumothorax. Shuttle clips in the upper abdomen. IMPRESSION: No active disease. Electronically Signed   By: Lucienne Capers M.D.   On: 03/06/2021 21:05        Scheduled Meds:   stroke: mapping our early stages of recovery book   Does not apply Once   apixaban  5 mg Oral BID   axitinib  5 mg Oral BID   insulin aspart  0-15 Units Subcutaneous TID WC   insulin aspart  0-5 Units Subcutaneous QHS   insulin glargine  27 Units Subcutaneous BID   lactulose  20 g Oral Once   metoprolol succinate  50 mg Oral Q breakfast   nystatin   Topical TID   sertraline  25 mg Oral Daily   Continuous Infusions:  diltiazem (CARDIZEM) infusion 5 mg/hr (03/07/21 1454)   magnesium sulfate bolus IVPB 2 g (03/07/21 1517)     LOS: 0 days    Time spent: 30 mins     Wyvonnia Dusky, MD [facility] Pager [phone]  If 7PM-7AM, please contact night-coverage [email or web address] Password [email or web address] 03/07/2021, 4:10 PM

## 2021-03-07 NOTE — Evaluation (Addendum)
Physical Therapy Evaluation Patient Details Name: Barbara Frank MRN: [id number] DOB: 11-19-1945 Today's Date: 03/07/2021   History of Present Illness  Pt is a 75 y/o F admitted on 03/06/21 from [facility] for "feeling confused". Pt was found to be in a-fib with RVR. Head imaging negative for acute abnormality. Pt being treated for confusion likely due to obstipation. PMH: a-fib, depression, DM, renal CA  Clinical Impression  Pt seen for PT evaluation with pt demonstrating impaired cognition, restless, and inability to consistently follow 1 step commands. Pt requires mod assist for supine>sit but max assist +2 for sit>supine with HOB elevated. Pt demonstrates poor trunk/core control and requires max assist to correct posterior lean when sitting EOB. Pt is able to stand & take 1 step to R then L with BUE HHA but appears nervous in standing. Pt becoming more difficult to re-direct as session progresses. At this time pt is unsafe to d/c home without 24 hr assistance/supervision and would benefit from STR upon d/c to maximize independence with mobility & reduce fall risk.    Addendum: OT arrived at end of session to provide +2 assist for safe standing & side steps at EOB.     Follow Up Recommendations SNF;Supervision/Assistance - 24 hour    Equipment Recommendations   (TBD in next venue)    Recommendations for Other Services       Precautions / Restrictions Precautions Precautions: Fall Restrictions Weight Bearing Restrictions: No      Mobility  Bed Mobility Overal bed mobility: Needs Assistance Bed Mobility: Supine to Sit;Sit to Supine     Supine to sit: Mod assist;HOB elevated Sit to supine: Max assist;+2 for physical assistance;HOB elevated        Transfers Overall transfer level: Needs assistance Equipment used: 1 person hand held assist;2 person hand held assist Transfers: Sit to/from Stand Sit to Stand: Mod assist;+2 physical assistance             Ambulation/Gait Ambulation/Gait assistance: Mod assist;+2 physical assistance;Max assist Gait Distance (Feet): 1 Feet Assistive device: 2 person hand held assist   Gait velocity: decreased   General Gait Details: Pt able to take 1 step to R then to L with BUE HHA & mod/max assist +2  Stairs            Wheelchair Mobility    Modified Rankin (Stroke Patients Only)       Balance Overall balance assessment: Needs assistance Sitting-balance support: Feet unsupported;Bilateral upper extremity supported Sitting balance-Leahy Scale: Zero   Postural control: Posterior lean   Standing balance-Leahy Scale: Poor                               Pertinent Vitals/Pain Pain Assessment: Faces Faces Pain Scale: Hurts a little bit Pain Location: back Pain Descriptors / Indicators: Grimacing Pain Intervention(s): Monitored during session;Repositioned    Home Living Family/patient expects to be discharged to:: Private residence     Type of Home: Assisted living         Home Equipment: Walker - 2 wheels Additional Comments: information taken from chart    Prior Function           Comments: Information obtained from chart, pt lives at [place] with walker     Hand Dominance        Extremity/Trunk Assessment   Upper Extremity Assessment Upper Extremity Assessment: Generalized weakness;Difficult to assess due to impaired cognition  Lower Extremity Assessment Lower Extremity Assessment: Generalized weakness;Difficult to assess due to impaired cognition       Communication   Communication: No difficulties  Cognition Arousal/Alertness: Awake/alert Behavior During Therapy: Restless Overall Cognitive Status: No family/caregiver present to determine baseline cognitive functioning                                 General Comments: Pt oriented to self & city, unsure of which hospital she's in or why. PT attempts to  re-orient pt throughout session. Pt fidgeting & distracted by lines with PT consistenty redirecting her. Pt very confused, follows simple one step commands with extra time 50% of the time or less. Pt is primarily limited by impaired cognition.      General Comments General comments (skin integrity, edema, etc.): Redness noted to peri area & under folds of stomach - nurse made aware    Exercises     Assessment/Plan    PT Assessment Patient needs continued PT services  PT Problem List Decreased strength;Decreased mobility;Decreased safety awareness;Decreased coordination;Decreased activity tolerance;Decreased balance;Decreased knowledge of use of DME;Decreased cognition;Cardiopulmonary status limiting activity       PT Treatment Interventions DME instruction;Therapeutic exercise;Gait training;Balance training;Stair training;Neuromuscular re-education;Functional mobility training;Cognitive remediation;Therapeutic activities;Patient/family education;Modalities    PT Goals (Current goals can be found in the Care Plan section)  Acute Rehab PT Goals Patient Stated Goal: none stated PT Goal Formulation: Patient unable to participate in goal setting Time For Goal Achievement: 03/21/21 Potential to Achieve Goals: Fair    Frequency Min 2X/week   Barriers to discharge        Co-evaluation               AM-PAC PT "6 Clicks" Mobility  Outcome Measure Help needed turning from your back to your side while in a flat bed without using bedrails?: A Lot Help needed moving from lying on your back to sitting on the side of a flat bed without using bedrails?: Total Help needed moving to and from a bed to a chair (including a wheelchair)?: Total Help needed standing up from a chair using your arms (e.g., wheelchair or bedside chair)?: A Lot Help needed to walk in hospital room?: Total Help needed climbing 3-5 steps with a railing? : Total 6 Click Score: 8    End of Session   Activity  Tolerance: Patient tolerated treatment well Patient left: in bed (in care of OT & nurse) Nurse Communication: Mobility status PT Visit Diagnosis: Muscle weakness (generalized) (M62.81);Difficulty in walking, not elsewhere classified (R26.2);Unsteadiness on feet (R26.81)    Time: 2395-3202 PT Time Calculation (min) (ACUTE ONLY): 16 min   Charges:   PT Evaluation $PT Eval Moderate Complexity: [place], PT, DPT 03/07/21, 10:51 AM   Waunita Schooner 03/07/2021, 10:45 AM

## 2021-03-07 NOTE — Evaluation (Signed)
Occupational Therapy Evaluation Patient Details Name: Barbara Frank MRN: [id number] DOB: 1945-11-21 Today's Date: 03/07/2021    History of Present Illness Pt is a 75 y/o F admitted on 03/06/21 from [facility] for "feeling confused". Pt was found to be in a-fib with RVR. Head imaging negative for acute abnormality. Pt being treated for confusion likely due to obstipation. PMH: a-fib, depression, DM, renal CA   Clinical Impression   Pt was seen for OT evaluation this date. Evaluation started [phone], taken to CT, completed once back from CT and after PT evaluation, 1032-1055, total 37 min.   Per chart, prior to hospital admission, pt was ambulatory with RW at [facility]. Pt alert, oriented to self and "hospital in [place]". Pt initially pleasant and agreeable to answering some questions but easily distracted, asking questions regarding why she is here, required encouragement and active listening when staff came to take her for CT scan. Once back from CT OT assisted PT with transfer prior to completing rest of OT evaluation after PT left (time spent with PT not included in charges). Pt required +2 assist for back to bed and rolling with RN to address changing saturated brief and address groin and skin under folds of stomach that were noted to be very moist and red. Pt became increasingly agitated and appeared fearful and verbalized distrust for RN and OT. Active listening provided to support rapport building and ultimately in order to maximize RN's ability to provide care to skin.     Currently pt demonstrates impairments as described below (See OT problem list) which functionally limit her ability to perform ADL/self-care tasks. Pt currently requires significantly increased assist for ADL and mobility 2/2 increased cognitive deficits as compared to baseline per chart, generalized weakness, and decreased balance.  Pt would benefit from skilled OT services to address noted impairments and functional  limitations (see below for any additional details) in order to maximize safety and independence while minimizing falls risk and caregiver burden. Upon hospital discharge, recommend SNF for trial of short term rehab to maximize pt safety and return to PLOF as well as minimize caregiver burden and functional decline long term.     Follow Up Recommendations  SNF    Equipment Recommendations  Other (comment) (TBD at next venue, likely needs [facility])    Recommendations for Other Services       Precautions / Restrictions Precautions Precautions: Fall Restrictions Weight Bearing Restrictions: No      Mobility Bed Mobility Overal bed mobility: Needs Assistance Bed Mobility: Rolling;Sit to Supine Rolling: Max assist;+2 for physical assistance   Supine to sit: Mod assist;HOB elevated Sit to supine: Max assist;+2 for physical assistance;HOB elevated   General bed mobility comments: pt very fearful, resistant to moving    Transfers Overall transfer level: Needs assistance Equipment used: 2 person hand held assist Transfers: Sit to/from Stand Sit to Stand: Mod assist;+2 physical assistance              Balance Overall balance assessment: Needs assistance Sitting-balance support: Feet unsupported;Bilateral upper extremity supported Sitting balance-Leahy Scale: Zero   Postural control: Posterior lean   Standing balance-Leahy Scale: Poor                             ADL either performed or assessed with clinical judgement   ADL Overall ADL's : Needs assistance/impaired  General ADL Comments: Pt currently requires MAX A x1-2 for bathing, dressing, toileting, all safest from bed level 2/2 increased confusion     Vision   Vision Assessment?: No apparent visual deficits     Perception     Praxis      Pertinent Vitals/Pain Pain Assessment: Faces Faces Pain Scale: Hurts a little bit Pain Location: back Pain  Descriptors / Indicators: Grimacing Pain Intervention(s): Limited activity within patient's tolerance;Monitored during session;Repositioned     Hand Dominance Right   Extremity/Trunk Assessment Upper Extremity Assessment Upper Extremity Assessment: Generalized weakness;Difficult to assess due to impaired cognition   Lower Extremity Assessment Lower Extremity Assessment: Generalized weakness;Difficult to assess due to impaired cognition       Communication Communication Communication: No difficulties   Cognition Arousal/Alertness: Awake/alert Behavior During Therapy: Agitated;Anxious Overall Cognitive Status: No family/caregiver present to determine baseline cognitive functioning                                 General Comments: Pt oriented to self, at a hospital in [place]. Pt confused, increasingly agitated, fearful, and difficult to redirect.   General Comments  redness noted in peri area and under folds of stomach and groin; OT assist RN with care at bed level to maximize skin integrity    Exercises     Shoulder Instructions      Home Living Family/patient expects to be discharged to:: Private residence     Type of Home: Assisted living                       Home Equipment: Gilford Rile - 2 wheels   Additional Comments: information taken from chart      Prior Functioning/Environment          Comments: Information obtained from chart, pt lives at [place] with walker        OT Problem List: Decreased cognition;Decreased activity tolerance;Decreased safety awareness;Impaired balance (sitting and/or standing);Decreased knowledge of use of DME or AE;Decreased strength      OT Treatment/Interventions: Self-care/ADL training;Therapeutic activities;Therapeutic exercise;Cognitive remediation/compensation;DME and/or AE instruction;Patient/family education;Balance training    OT Goals(Current goals can be found in the care  plan section) Acute Rehab OT Goals Patient Stated Goal: none stated OT Goal Formulation: Patient unable to participate in goal setting Time For Goal Achievement: 03/21/21 Potential to Achieve Goals: Good ADL Goals Pt Will Perform Upper Body Dressing: sitting;with min assist Pt Will Perform Lower Body Dressing: sit to/from stand;with mod assist Pt Will Transfer to Toilet: with min assist;ambulating;bedside commode (LRAD)  OT Frequency: Min 1X/week   Barriers to D/C:            Co-evaluation              AM-PAC OT "6 Clicks" Daily Activity     Outcome Measure Help from another person eating meals?: None Help from another person taking care of personal grooming?: A Little Help from another person toileting, which includes using toliet, bedpan, or urinal?: Total Help from another person bathing (including washing, rinsing, drying)?: Total Help from another person to put on and taking off regular upper body clothing?: A Lot Help from another person to put on and taking off regular lower body clothing?: Total 6 Click Score: 12   End of Session Nurse Communication: Mobility status;Other (comment) (skin red in groin and under folds)  Activity Tolerance: Treatment limited secondary  to agitation Patient left: in bed;with call bell/phone within reach  OT Visit Diagnosis: Other abnormalities of gait and mobility (R26.89);History of falling (Z91.81);Muscle weakness (generalized) (M62.81);Other symptoms and signs involving cognitive function                Time: 0931 (first part [phone], taken to CT, completed 1032-1055, total 48min)-1055 OT Time Calculation (min): 84 min Charges:  OT General Charges $OT Visit: 1 Visit OT Evaluation $OT Eval Moderate Complexity: 1 Mod OT Treatments $Self Care/Home Management : 8-22 mins  Hanley Hays, MPH, MS, OTR/L ascom [phone] 03/07/21, 12:13 PM

## 2021-03-07 NOTE — ED Notes (Signed)
RN to bedside due to bed alarm going off. Pt sliding down in the bed, appears agitated - pulling at her wires, removing blood pressure cuff.   Agricultural consultant again notified that sitter not present and is needed, attempted to call tele sitter and was told "I need to go through staffing and get an order".   Additional staff at bedside to monitor patient. Stretcher locked in low position with side rails up x2, bed alarm in use.

## 2021-03-07 NOTE — ED Notes (Signed)
PT at bedside.

## 2021-03-07 NOTE — ED Notes (Signed)
OT at bedside. 

## 2021-03-07 NOTE — Progress Notes (Signed)
Dr. Mitchell Heir made aware of patient's refusal of care and treatments and not consuming the prep solution for diagnostic testing.

## 2021-03-07 NOTE — ED Notes (Signed)
Attending MD messaged regarding patient's heart rate and mental status. No additional PRN orders available at this time.

## 2021-03-07 NOTE — Progress Notes (Signed)
Patient received about about 4am. Patient noted able to answer all orientation question. However, patient noted to make statement such as, "I am on the bed"., or "I am swimming". Patient was checked for incontinence. Safety maintained. Bed kept in  the lowest position. Call light within reach. Will continue to monitor and endorse.

## 2021-03-07 NOTE — ED Notes (Signed)
Patient allowed this RN to administer insulin and metoprolol, see mar. Given at [place].

## 2021-03-07 NOTE — ED Notes (Signed)
RN to room, cardiac monitor alarming - heart rate 190, pt found sitting on edge of bed. Pt assisted back into bed, requiring assistance from staff x3. Will administer IV metoprolol for heart rate and ativan, patient appears restless and is combative with staff.   Charge RN notified that sitter is required for pt safety. RN remains at bedside. High fall precautions in place.

## 2021-03-07 NOTE — Progress Notes (Signed)
SLP Cancellation Note  Patient Details Name: Barbara Frank MRN: [id number] DOB: [birth year]   Cancelled treatment:       Reason Eval/Treat Not Completed: Patient not medically ready (Pt's level of Cognition presently). Pt is currently exhibiting increased confusion, decreased awareness and insight, and poor follow through instructions. A Cognitive-linguistic assessment is not indicated at this time d/t pt's reduced level of awareness of self and situation. Due to pt's current presentation, ST services will also modify pt's diet for safer swallowing and intake; MD agreed. Recommend Pills Crushed in Puree for safer swallowing also; Supervision during meals for support as needed. NSG has placed a Sitter in pt's room.  ST services will f/u on Monday. NSG updated, agreed. Precautions placed in room.       Orinda Kenner, MS, CCC-SLP Speech Language Pathologist Rehab Services [phone] [facility] 03/07/2021, 2:30 PM

## 2021-03-07 NOTE — ED Notes (Signed)
Attempted to administer morning medications including insulin and metoprolol. Patient able to verbalize her name, birthday, year, president, and knows that she is at the hospital - unsure of which hospital. Attempted to educate patient on need for metoprolol due to current heart rate of 120's-150's. Pt makes repetitive statements such as "I don't think I'm receiving any empathy" and "what I really need is my cancer medication". Patient also states "well I have been told I won't live until Christmas". Patient is not redirectable verbally at this time. Messaged attending MD. Continuous cardiac monitoring in use, stretcher locked in low position with side rails up x2, call light in reach. High fall precautions in place.

## 2021-03-07 NOTE — ED Notes (Signed)
Pt resting quietly with eyes closed. Chest rise and fall observed, respirations regular and unlabored. Stretcher locked in low position with side rails up x2, call light in reach. High fall precautions including bed alarm in use. Plan to utilize tele sitter.

## 2021-03-07 NOTE — ED Notes (Signed)
Medication given for heart rate of 150, rhythm A Fib. Attending MD aware, provided order for this medication - see mar.  Pt resting, appears comfortable. Is able to appropriately answer questions but appears to have intermittent confusion regarding situation, location, necessity of medical treatment.  Continuous cardiac monitoring in place. Stretcher locked in low position with side rails up x2, call light in reach. High fall precautions in place.

## 2021-03-08 DIAGNOSIS — Z7901 Long term (current) use of anticoagulants: Secondary | ICD-10-CM | POA: Diagnosis not present

## 2021-03-08 DIAGNOSIS — Z87442 Personal history of urinary calculi: Secondary | ICD-10-CM | POA: Diagnosis not present

## 2021-03-08 DIAGNOSIS — B954 Other streptococcus as the cause of diseases classified elsewhere: Secondary | ICD-10-CM | POA: Diagnosis present

## 2021-03-08 DIAGNOSIS — R1310 Dysphagia, unspecified: Secondary | ICD-10-CM | POA: Diagnosis present

## 2021-03-08 DIAGNOSIS — G9340 Encephalopathy, unspecified: Secondary | ICD-10-CM | POA: Diagnosis present

## 2021-03-08 DIAGNOSIS — Z79899 Other long term (current) drug therapy: Secondary | ICD-10-CM | POA: Diagnosis not present

## 2021-03-08 DIAGNOSIS — R410 Disorientation, unspecified: Secondary | ICD-10-CM | POA: Diagnosis not present

## 2021-03-08 DIAGNOSIS — D72828 Other elevated white blood cell count: Secondary | ICD-10-CM | POA: Diagnosis not present

## 2021-03-08 DIAGNOSIS — Z9109 Other allergy status, other than to drugs and biological substances: Secondary | ICD-10-CM | POA: Diagnosis not present

## 2021-03-08 DIAGNOSIS — Z803 Family history of malignant neoplasm of breast: Secondary | ICD-10-CM | POA: Diagnosis not present

## 2021-03-08 DIAGNOSIS — I482 Chronic atrial fibrillation, unspecified: Secondary | ICD-10-CM | POA: Diagnosis present

## 2021-03-08 DIAGNOSIS — C649 Malignant neoplasm of unspecified kidney, except renal pelvis: Secondary | ICD-10-CM | POA: Diagnosis present

## 2021-03-08 DIAGNOSIS — Z794 Long term (current) use of insulin: Secondary | ICD-10-CM | POA: Diagnosis not present

## 2021-03-08 DIAGNOSIS — K5900 Constipation, unspecified: Secondary | ICD-10-CM | POA: Diagnosis present

## 2021-03-08 DIAGNOSIS — E119 Type 2 diabetes mellitus without complications: Secondary | ICD-10-CM | POA: Diagnosis not present

## 2021-03-08 DIAGNOSIS — Z515 Encounter for palliative care: Secondary | ICD-10-CM | POA: Diagnosis not present

## 2021-03-08 DIAGNOSIS — N390 Urinary tract infection, site not specified: Secondary | ICD-10-CM | POA: Diagnosis not present

## 2021-03-08 DIAGNOSIS — N3001 Acute cystitis with hematuria: Secondary | ICD-10-CM | POA: Diagnosis present

## 2021-03-08 DIAGNOSIS — I10 Essential (primary) hypertension: Secondary | ICD-10-CM | POA: Diagnosis present

## 2021-03-08 DIAGNOSIS — R4182 Altered mental status, unspecified: Secondary | ICD-10-CM | POA: Diagnosis not present

## 2021-03-08 DIAGNOSIS — G9341 Metabolic encephalopathy: Secondary | ICD-10-CM | POA: Diagnosis present

## 2021-03-08 DIAGNOSIS — R531 Weakness: Secondary | ICD-10-CM | POA: Diagnosis present

## 2021-03-08 DIAGNOSIS — E1165 Type 2 diabetes mellitus with hyperglycemia: Secondary | ICD-10-CM | POA: Diagnosis present

## 2021-03-08 DIAGNOSIS — I4891 Unspecified atrial fibrillation: Secondary | ICD-10-CM | POA: Diagnosis not present

## 2021-03-08 DIAGNOSIS — Z9884 Bariatric surgery status: Secondary | ICD-10-CM | POA: Diagnosis not present

## 2021-03-08 DIAGNOSIS — F32A Depression, unspecified: Secondary | ICD-10-CM

## 2021-03-08 DIAGNOSIS — F0390 Unspecified dementia without behavioral disturbance: Secondary | ICD-10-CM | POA: Diagnosis present

## 2021-03-08 DIAGNOSIS — E785 Hyperlipidemia, unspecified: Secondary | ICD-10-CM | POA: Diagnosis present

## 2021-03-08 DIAGNOSIS — M79604 Pain in right leg: Secondary | ICD-10-CM | POA: Diagnosis present

## 2021-03-08 DIAGNOSIS — F05 Delirium due to known physiological condition: Secondary | ICD-10-CM | POA: Diagnosis present

## 2021-03-08 DIAGNOSIS — L304 Erythema intertrigo: Secondary | ICD-10-CM | POA: Diagnosis present

## 2021-03-08 DIAGNOSIS — Z20822 Contact with and (suspected) exposure to covid-19: Secondary | ICD-10-CM | POA: Diagnosis present

## 2021-03-08 DIAGNOSIS — I480 Paroxysmal atrial fibrillation: Secondary | ICD-10-CM | POA: Diagnosis present

## 2021-03-08 LAB — BASIC METABOLIC PANEL
Anion gap: 9 (ref 5–15)
BUN: 17 mg/dL (ref 8–23)
CO2: 26 mmol/L (ref 22–32)
Calcium: 10.6 mg/dL — ABNORMAL HIGH (ref 8.9–10.3)
Chloride: 104 mmol/L (ref 98–111)
Creatinine, Ser: 0.5 mg/dL (ref 0.44–1.00)
GFR, Estimated: >60 mL/min (ref >60)
Glucose, Bld: 235 mg/dL — ABNORMAL HIGH (ref 70–99)
Potassium: 4.1 mmol/L (ref 3.5–5.1)
Sodium: 139 mmol/L (ref 135–145)

## 2021-03-08 LAB — CBC
HCT: 42.9 % (ref 36.0–46.0)
Hemoglobin: 13.9 g/dL (ref 12.0–15.0)
MCH: 27 pg (ref 26.0–34.0)
MCHC: 32.4 g/dL (ref 30.0–36.0)
MCV: 83.5 fL (ref 80.0–100.0)
Platelets: 245 10*3/uL (ref 150–400)
RBC: 5.14 MIL/uL — ABNORMAL HIGH (ref 3.87–5.11)
RDW: 14.1 % (ref 11.5–15.5)
WBC: 11.4 10*3/uL — ABNORMAL HIGH (ref 4.0–10.5)
nRBC: 0 % (ref 0.0–0.2)

## 2021-03-08 LAB — GLUCOSE, CAPILLARY
Glucose-Capillary: 238 mg/dL — ABNORMAL HIGH (ref 70–99)
Glucose-Capillary: 231 mg/dL — ABNORMAL HIGH (ref 70–99)
Glucose-Capillary: 296 mg/dL — ABNORMAL HIGH (ref 70–99)
Glucose-Capillary: 207 mg/dL — ABNORMAL HIGH (ref 70–99)
Glucose-Capillary: 286 mg/dL — ABNORMAL HIGH (ref 70–99)

## 2021-03-08 LAB — MAGNESIUM: Magnesium: 1.8 mg/dL (ref 1.7–2.4)

## 2021-03-08 NOTE — Progress Notes (Signed)
PROGRESS NOTE    Barbara Frank  SAY:[id number] DOB: 09-Sep-1945 DOA: 03/06/2021 PCP: Sheral Apley    Assessment & Plan:   Principal Problem:   Altered mental status Active Problems:   Atrial fibrillation ([place])   Depression   Diabetes type 2, uncontrolled ([place])   Renal cell carcinoma ([place])   Transient confusion   Intertrigo   Encephalopathy   Acute metabolic encephalopathy: unchanged from day prior. Etiology unclear, possibly delirium vs dementia vs infection. Agitated and pulling out lines, continue w/ mittens. Urine drug screen was neg. Ethanol was neg. CT head shows generalized cerebral atrophy & no acute intracranial abnormalities. MRI brain shows no acute intracranial abnormalities. Urine cx ordered. Mental status is far from baseline as per pt's niece. Haldol, ativan prn.   Stage IV renal cell carcinoma: will f/u outpatient w/ onco   Intertrigo: continue on nystatin powder   Likely PAF: w/ intermittent RVR. Continue on metoprolol, eliquis but pt intermittent refuses to take po meds. Continue on IV cardizem drip and wean as tolerated   Depression: severity unknown. Continue on home dose of sertraline    DM2: likely poorly controlled. Continue on lantus, SSI w/ accuchecks    DVT prophylaxis: eliquis  Code Status:  full  Family Communication:  Disposition Plan: depends on PT/OT recs   Level of care: Progressive Cardiac  Status is: Inpatient  Remains inpatient appropriate because:Altered mental status, IV treatments appropriate due to intensity of illness or inability to take PO, and Inpatient level of care appropriate due to severity of illness  Dispo: The patient is from: ALF              Anticipated d/c is to: ALF              Patient currently is not medically stable to d/c.   Difficult to place patient : unclear           Consultants:    Procedures:   Antimicrobials:    Subjective: Pt is confused still   Objective: Vitals:   03/08/21  0200 03/08/21 0400 03/08/21 0805 03/08/21 1200  BP: (!) 147/100 (!) 145/72 108/62 119/67  Pulse:   84 78  Resp: 19 (!) 23 18 15   Temp:   98.7 F (37.1 C) 98.3 F (36.8 C)  TempSrc:   Oral Axillary  SpO2:   94% 96%  Weight:      Height:        Intake/Output Summary (Last 24 hours) at 03/08/2021 1339 Last data filed at 03/08/2021 1335 Gross per 24 hour  Intake 109.99 ml  Output 250 ml  Net -140.01 ml   Filed Weights   03/06/21 1623  Weight: 83.7 kg    Examination:  General exam: Appears confused & lethargic  Respiratory system: clear breath sounds b/l  Cardiovascular system: S1/S2+. No clicks or rubs  Gastrointestinal system: Abd is soft, NT, ND & hypoactive bowel sounds Central nervous system:  Lethargic. Moves all extremities  Psychiatry: Judgement and insight appears abnormal. Flat mood and affect   Data Reviewed: I have personally reviewed following labs and imaging studies  CBC: Recent Labs  Lab 03/06/21 1629 03/08/21 0944  WBC 5.7 11.4*  NEUTROABS 3.7  --   HGB 12.9 13.9  HCT 39.3 42.9  MCV 82.7 83.5  PLT 213 235   Basic Metabolic Panel: Recent Labs  Lab 03/06/21 1629 03/07/21 0001 03/08/21 0944  NA 139  --  139  K 4.1  --  4.1  CL  107  --  104  CO2 26  --  26  GLUCOSE 282*  --  235*  BUN 13  --  17  CREATININE 0.50  --  0.50  CALCIUM 9.9  --  10.6*  MG  --  1.6* 1.8   GFR: Estimated Creatinine Clearance: 61.8 mL/min (by C-G formula based on SCr of 0.5 mg/dL). Liver Function Tests: No results for input(s): AST, ALT, ALKPHOS, BILITOT, PROT, ALBUMIN in the last 168 hours. No results for input(s): LIPASE, AMYLASE in the last 168 hours. Recent Labs  Lab 03/06/21 1629  AMMONIA 50*   Coagulation Profile: No results for input(s): INR, PROTIME in the last 168 hours. Cardiac Enzymes: No results for input(s): CKTOTAL, CKMB, CKMBINDEX, TROPONINI in the last 168 hours. BNP (last 3 results) No results for input(s): PROBNP in the last 8760  hours. HbA1C: No results for input(s): HGBA1C in the last 72 hours. CBG: Recent Labs  Lab 03/07/21 1635 03/07/21 2243 03/08/21 0822 03/08/21 0957 03/08/21 1219  GLUCAP 205* 162* 238* 231* 296*   Lipid Profile: Recent Labs    03/07/21 0001  CHOL 128  HDL 36*  LDLCALC 78  TRIG 71  CHOLHDL 3.6   Thyroid Function Tests: Recent Labs    03/06/21 1629  TSH 2.211   Anemia Panel: Recent Labs    03/07/21 0714  VITAMINB12 483   Sepsis Labs: No results for input(s): PROCALCITON, LATICACIDVEN in the last 168 hours.  Recent Results (from the past 240 hour(s))  Resp Panel by RT-PCR (Flu A&B, Covid) Nasopharyngeal Swab     Status: None   Collection Time: 03/06/21  4:56 PM   Specimen: Nasopharyngeal Swab; Nasopharyngeal(NP) swabs in vial transport medium  Result Value Ref Range Status   SARS Coronavirus 2 by RT PCR NEGATIVE NEGATIVE Final    Comment: (NOTE) SARS-CoV-2 target nucleic acids are NOT DETECTED.  The SARS-CoV-2 RNA is generally detectable in upper respiratory specimens during the acute phase of infection. The lowest concentration of SARS-CoV-2 viral copies this assay can detect is 138 copies/mL. A negative result does not preclude SARS-Cov-2 infection and should not be used as the sole basis for treatment or other patient management decisions. A negative result may occur with  improper specimen collection/handling, submission of specimen other than nasopharyngeal swab, presence of viral mutation(s) within the areas targeted by this assay, and inadequate number of viral copies(<138 copies/mL). A negative result must be combined with clinical observations, patient history, and epidemiological information. The expected result is Negative.  Fact Sheet for Patients:  EntrepreneurPulse.com.au  Fact Sheet for Healthcare Providers:  IncredibleEmployment.be  This test is no t yet approved or cleared by the [place] FDA and   has been authorized for detection and/or diagnosis of SARS-CoV-2 by FDA under an Emergency Use Authorization (EUA). This EUA will remain  in effect (meaning this test can be used) for the duration of the COVID-19 declaration under Section 564(b)(1) of the Act, 21 U.S.C.section 360bbb-3(b)(1), unless the authorization is terminated  or revoked sooner.       Influenza A by PCR NEGATIVE NEGATIVE Final   Influenza B by PCR NEGATIVE NEGATIVE Final    Comment: (NOTE) The Xpert Xpress SARS-CoV-2/FLU/RSV plus assay is intended as an aid in the diagnosis of influenza from Nasopharyngeal swab specimens and should not be used as a sole basis for treatment. Nasal washings and aspirates are unacceptable for Xpert Xpress SARS-CoV-2/FLU/RSV testing.  Fact Sheet for Patients: EntrepreneurPulse.com.au  Fact Sheet for Healthcare Providers:  IncredibleEmployment.be  This test is not yet approved or cleared by the [place] and has been authorized for detection and/or diagnosis of SARS-CoV-2 by FDA under an Emergency Use Authorization (EUA). This EUA will remain in effect (meaning this test can be used) for the duration of the COVID-19 declaration under Section 564(b)(1) of the Act, 21 U.S.C. section 360bbb-3(b)(1), unless the authorization is terminated or revoked.  Performed at [facility], [address] [place], [place] [address]   MRSA Next Gen by PCR, Nasal     Status: None   Collection Time: 03/07/21  6:56 PM   Specimen: Nasal Mucosa; Nasal Swab  Result Value Ref Range Status   MRSA by PCR Next Gen NOT DETECTED NOT DETECTED Final    Comment: (NOTE) The GeneXpert MRSA Assay (FDA approved for NASAL specimens only), is one component of a comprehensive MRSA colonization surveillance program. It is not intended to diagnose MRSA infection nor to guide or monitor treatment for MRSA infections. Test performance is not FDA approved in  patients less than 29 years old. Performed at [facility], [address], [place], [place] [address]          Radiology Studies: CT Head Wo Contrast  Result Date: 03/06/2021 CLINICAL DATA:  Altered mental status. EXAM: CT HEAD WITHOUT CONTRAST TECHNIQUE: Contiguous axial images were obtained from the base of the skull through the vertex without intravenous contrast. COMPARISON:  None. FINDINGS: Brain: There is mild cerebral atrophy with widening of the extra-axial spaces and ventricular dilatation. There are areas of decreased attenuation within the white matter tracts of the supratentorial brain, consistent with microvascular disease changes. Vascular: No hyperdense vessel or unexpected calcification. Skull: Normal. Negative for fracture or focal lesion. Sinuses/Orbits: No acute finding. Other: None. IMPRESSION: 1. Generalized cerebral atrophy. 2. No acute intracranial abnormality. Electronically Signed   By: Virgina Norfolk M.D.   On: 03/06/2021 17:23   MR BRAIN WO CONTRAST  Result Date: 03/06/2021 CLINICAL DATA:  Encephalopathy EXAM: MRI HEAD WITHOUT CONTRAST TECHNIQUE: Multiplanar, multiecho pulse sequences of the brain and surrounding structures were obtained without intravenous contrast. COMPARISON:  None. FINDINGS: Brain: No acute infarct, mass effect or extra-axial collection. No acute or chronic hemorrhage. There is multifocal hyperintense T2-weighted signal within the white matter. Parenchymal volume and CSF spaces are normal. The midline structures are normal. Vascular: Major flow voids are preserved. Skull and upper cervical spine: Normal calvarium and skull base. Visualized upper cervical spine and soft tissues are normal. Sinuses/Orbits:No paranasal sinus fluid levels or advanced mucosal thickening. No mastoid or middle ear effusion. Normal orbits. IMPRESSION: 1. No acute intracranial abnormality. 2. Findings of chronic small vessel ischemia. Electronically Signed   By:  Ulyses Jarred M.D.   On: 03/06/2021 22:23   CT ABDOMEN PELVIS W CONTRAST  Result Date: 03/07/2021 CLINICAL DATA:  75 year old with suspected bowel obstruction. Renal cell carcinoma. EXAM: CT ABDOMEN AND PELVIS WITH CONTRAST TECHNIQUE: Multidetector CT imaging of the abdomen and pelvis was performed using the standard protocol following bolus administration of intravenous contrast. CONTRAST:  167mL OMNIPAQUE IOHEXOL 300 MG/ML  SOLN COMPARISON:  CT 10/03/2020 FINDINGS: Lower chest: New 1.5 cm peripheral nodule in the right lower lobe on sequence 5, image 8. 5 mm peripheral nodule in the right lower lobe on image 13 may have been present on the exam from 10/03/2020. No significant pleural fluid. Hepatobiliary: Cholecystectomy. Normal appearance of the liver. No significant biliary dilatation. Pancreas: Unremarkable. No pancreatic ductal dilatation or surrounding inflammatory  changes. Spleen: Normal in size without focal abnormality. Adrenals/Urinary Tract: Normal adrenal glands. Normal appearance of the left kidney without hydronephrosis. Normal appearance of the urinary bladder with mild distention. Again noted is a large mass involving the anterior aspect of the right kidney which appears to be involving the right renal sinus and hilum. The right renal mass measures 5.4 x 4.9 x 6.4 cm and previously measured 5.0 x 4.4 x 6.3 cm. Mild stranding around the right renal lesion that was not present on the previous examination. No evidence for rupture or hemorrhage. Stomach/Bowel: Prior gastric surgery with multiple surgical clips in the upper abdomen. Surgical findings are suggestive for a gastric bypass procedure. No evidence for acute bowel inflammation or obstruction. Vascular/Lymphatic: Normal caliber of the abdominal aorta. Main visceral arteries are patent. Again noted are enlarged right retroperitoneal lymph nodes in the retrocaval region. Index lymph node on sequence 2 image 25 measures 2.0 x 2.3 cm and  minimally changed. Right renal mass is abutting the right renal vein but no clear evidence of intravascular involvement. Reproductive: Again noted is a small uterus with peripherally calcified fibroids. No evidence for an adnexal mass. Other: Negative for ascites. Extensive postoperative changes along the anterior abdominal wall. Musculoskeletal: Stable lucency involving the T12 right pedicle region. Mild sclerosis along the left side of the L1 vertebral body is also stable. No clear evidence for osseous metastatic disease. IMPRESSION: 1. Right renal mass is compatible with a renal cell carcinoma. This renal mass has slightly enlarged since 10/03/2020. The adjacent right retroperitoneal lymphadenopathy has minimally changed. However, there is a new 1.5 cm nodule in the right lower lobe. This is suggestive for new metastatic disease. 2. Mild stranding around the right renal mass without evidence of hemorrhage. 3. 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 No evidence for acute bowel inflammation or obstruction. Electronically Signed   By: Markus Daft M.D.   On: 03/07/2021 10:23   DG Chest Port 1 View  Result Date: 03/06/2021 CLINICAL DATA:  Shortness of breath.  Confusion. EXAM: PORTABLE CHEST 1 VIEW COMPARISON:  CT chest 10/03/2020 FINDINGS: Shallow inspiration. Heart size and pulmonary vascularity are normal for technique. Lungs are clear. No pleural effusions. No pneumothorax. Shuttle clips in the upper abdomen. IMPRESSION: No active disease. Electronically Signed   By: Lucienne Capers M.D.   On: 03/06/2021 21:05        Scheduled Meds:   stroke: mapping our early stages of recovery book   Does not apply Once   apixaban  5 mg Oral BID   axitinib  5 mg Oral BID   insulin aspart  0-15 Units Subcutaneous TID WC   insulin aspart  0-5 Units Subcutaneous QHS   insulin glargine  27 Units Subcutaneous BID   metoprolol succinate  50 mg Oral Q breakfast   nystatin   Topical TID    sertraline  25 mg Oral Daily   Continuous Infusions:  diltiazem (CARDIZEM) infusion 5 mg/hr (03/08/21 0954)     LOS: 0 days    Time spent: 31 mins     Wyvonnia Dusky, MD [facility] Pager [phone]  If 7PM-7AM, please contact night-coverage [email or web address] Password [email or web address] 03/08/2021, 1:39 PM

## 2021-03-08 NOTE — Plan of Care (Signed)
  Problem: Education: Goal: Knowledge of General Education information will improve Description: Including pain rating scale, medication(s)/side effects and non-pharmacologic comfort measures Outcome: Progressing   Problem: Clinical Measurements: Goal: Cardiovascular complication will be avoided Outcome: Progressing   Problem: Elimination: Goal: Will not experience complications related to urinary retention Outcome: Progressing   Problem: Safety: Goal: Ability to remain free from injury will improve 03/08/2021 1838 by Basilio Cairo, RN Outcome: Progressing 03/08/2021 1837 by Basilio Cairo, RN Outcome: Progressing   Problem: Skin Integrity: Goal: Risk for impaired skin integrity will decrease Outcome: Progressing

## 2021-03-09 DIAGNOSIS — D72828 Other elevated white blood cell count: Secondary | ICD-10-CM

## 2021-03-09 LAB — BASIC METABOLIC PANEL
Anion gap: 9 (ref 5–15)
BUN: 18 mg/dL (ref 8–23)
CO2: 26 mmol/L (ref 22–32)
Calcium: 10.4 mg/dL — ABNORMAL HIGH (ref 8.9–10.3)
Chloride: 102 mmol/L (ref 98–111)
Creatinine, Ser: 0.51 mg/dL (ref 0.44–1.00)
GFR, Estimated: >60 mL/min (ref >60)
Glucose, Bld: 193 mg/dL — ABNORMAL HIGH (ref 70–99)
Potassium: 3.9 mmol/L (ref 3.5–5.1)
Sodium: 137 mmol/L (ref 135–145)

## 2021-03-09 LAB — MAGNESIUM: Magnesium: 1.6 mg/dL — ABNORMAL LOW (ref 1.7–2.4)

## 2021-03-09 LAB — GLUCOSE, CAPILLARY
Glucose-Capillary: 232 mg/dL — ABNORMAL HIGH (ref 70–99)
Glucose-Capillary: 240 mg/dL — ABNORMAL HIGH (ref 70–99)
Glucose-Capillary: 270 mg/dL — ABNORMAL HIGH (ref 70–99)
Glucose-Capillary: 189 mg/dL — ABNORMAL HIGH (ref 70–99)
Glucose-Capillary: 224 mg/dL — ABNORMAL HIGH (ref 70–99)

## 2021-03-09 LAB — CBC
HCT: 41.7 % (ref 36.0–46.0)
Hemoglobin: 13.8 g/dL (ref 12.0–15.0)
MCH: 27.4 pg (ref 26.0–34.0)
MCHC: 33.1 g/dL (ref 30.0–36.0)
MCV: 82.9 fL (ref 80.0–100.0)
Platelets: 262 10*3/uL (ref 150–400)
RBC: 5.03 MIL/uL (ref 3.87–5.11)
RDW: 14.3 % (ref 11.5–15.5)
WBC: 11.2 10*3/uL — ABNORMAL HIGH (ref 4.0–10.5)
nRBC: 0 % (ref 0.0–0.2)

## 2021-03-09 MED ORDER — OXYCODONE-ACETAMINOPHEN 5-325 MG PO TABS
1.0000 | ORAL_TABLET | Freq: Four times a day (QID) | ORAL | Status: DC | PRN
  Administered 2021-03-09: 1 via ORAL
  Filled 2021-03-09: qty 1

## 2021-03-09 MED ORDER — MAGNESIUM SULFATE 2 GM/50ML IV SOLN
2.0000 g | Freq: Once | INTRAVENOUS | Status: AC
  Administered 2021-03-09: 2 g via INTRAVENOUS
  Filled 2021-03-09: qty 50

## 2021-03-09 MED ORDER — LACTULOSE 10 GM/15ML PO SOLN
20.0000 g | Freq: Two times a day (BID) | ORAL | Status: DC
  Administered 2021-03-09 – 2021-03-12 (×3): 20 g via ORAL
  Filled 2021-03-09 (×4): qty 30

## 2021-03-09 NOTE — Progress Notes (Signed)
   03/09/21 0940  Clinical Encounter Type  Visited With Patient  Visit Type Initial;Psychological support;Spiritual support;Social support  Referral From Other (Comment) (rounding)  [place] visited Ms. Marling who was in bed appearing agitated. PT was not very communicative; Chaplain Burris offered non-anxious, compassionate presence.

## 2021-03-09 NOTE — Progress Notes (Signed)
PROGRESS NOTE    Barbara Frank  IRJ:[id number] DOB: 1945/11/16 DOA: 03/06/2021 PCP: Sheral Apley    Assessment & Plan:   Principal Problem:   Altered mental status Active Problems:   Atrial fibrillation ([place])   Depression   Diabetes type 2, uncontrolled ([place])   Renal cell carcinoma ([place])   Transient confusion   Intertrigo   Encephalopathy   Acute metabolic encephalopathy:  remains unchanged. Etiology unclear, possibly delirium vs dementia vs infection. Agitated and pulling out lines, continue w/ mittens. Urine drug screen was neg. Ethanol was neg. CT head shows generalized cerebral atrophy & no acute intracranial abnormalities. MRI brain shows no acute intracranial abnormalities. Urine cx pending. Blood cxs ordered. Mental status is far from baseline as per pt's niece. Haldol, ativan prn.   Leukocytosis: reactive vs infection   Stage IV renal cell carcinoma: needs outpatient f/u w/ onco   Intertrigo: continue on nystatin powder   Likely PAF: w/ intermittent RVR. Continue on metoprolol, eliquis but pt intermittent refuses to take po meds. Continue on IV cardizem drip and wean as tolerated   Depression: severity unknown. Continue on home dose of sertraline    DM2: likely poorly controlled. Continue on lantus, SSI w/ accuchecks   DVT prophylaxis: eliquis  Code Status:  full  Family Communication:  Disposition Plan: possible d/c to SNF   Level of care: Progressive Cardiac  Status is: Inpatient  Remains inpatient appropriate because:Altered mental status, IV treatments appropriate due to intensity of illness or inability to take PO, and Inpatient level of care appropriate due to severity of illness  Dispo: The patient is from: ALF              Anticipated d/c is to: ALF vs SNF               Patient currently is not medically stable to d/c.   Difficult to place patient : unclear           Consultants:    Procedures:   Antimicrobials:     Subjective: Pt is still very confused & grimacing like she is in pain but does not say where  Objective: Vitals:   03/09/21 0100 03/09/21 0200 03/09/21 0400 03/09/21 0800  BP:  123/62 126/74 123/71  Pulse:    (!) 119  Resp: 17 (!) 25 19 17   Temp:    99.4 F (37.4 C)  TempSrc:    Oral  SpO2:    98%  Weight:      Height:        Intake/Output Summary (Last 24 hours) at 03/09/2021 0823 Last data filed at 03/08/2021 1830 Gross per 24 hour  Intake 685 ml  Output 250 ml  Net 435 ml   Filed Weights   03/06/21 1623  Weight: 83.7 kg    Examination:  General exam: Appears uncomfortable Respiratory system: clear breath sounds b/l  Cardiovascular system: S1 & S2+. No rubs or clicks  Gastrointestinal system: Abd is soft, NT & hypoactive bowel sounds Central nervous system:  Awake. Moves all extremities  Psychiatry: judgement and insight appear abnormal. Flat mood and affect   Data Reviewed: I have personally reviewed following labs and imaging studies  CBC: Recent Labs  Lab 03/06/21 1629 03/08/21 0944 03/09/21 0531  WBC 5.7 11.4* 11.2*  NEUTROABS 3.7  --   --   HGB 12.9 13.9 13.8  HCT 39.3 42.9 41.7  MCV 82.7 83.5 82.9  PLT 213 245 301   Basic Metabolic Panel:  Recent Labs  Lab 03/06/21 1629 03/07/21 0001 03/08/21 0944 03/09/21 0531  NA 139  --  139 137  K 4.1  --  4.1 3.9  CL 107  --  104 102  CO2 26  --  26 26  GLUCOSE 282*  --  235* 193*  BUN 13  --  17 18  CREATININE 0.50  --  0.50 0.51  CALCIUM 9.9  --  10.6* 10.4*  MG  --  1.6* 1.8 1.6*   GFR: Estimated Creatinine Clearance: 61.8 mL/min (by C-G formula based on SCr of 0.51 mg/dL). Liver Function Tests: No results for input(s): AST, ALT, ALKPHOS, BILITOT, PROT, ALBUMIN in the last 168 hours. No results for input(s): LIPASE, AMYLASE in the last 168 hours. Recent Labs  Lab 03/06/21 1629  AMMONIA 50*   Coagulation Profile: No results for input(s): INR, PROTIME in the last 168 hours. Cardiac  Enzymes: No results for input(s): CKTOTAL, CKMB, CKMBINDEX, TROPONINI in the last 168 hours. BNP (last 3 results) No results for input(s): PROBNP in the last 8760 hours. HbA1C: No results for input(s): HGBA1C in the last 72 hours. CBG: Recent Labs  Lab 03/08/21 0957 03/08/21 1219 03/08/21 1707 03/08/21 2130 03/09/21 0819  GLUCAP 231* 296* 207* 286* 232*   Lipid Profile: Recent Labs    03/07/21 0001  CHOL 128  HDL 36*  LDLCALC 78  TRIG 71  CHOLHDL 3.6   Thyroid Function Tests: Recent Labs    03/06/21 1629  TSH 2.211   Anemia Panel: Recent Labs    03/07/21 0714  VITAMINB12 483   Sepsis Labs: No results for input(s): PROCALCITON, LATICACIDVEN in the last 168 hours.  Recent Results (from the past 240 hour(s))  Resp Panel by RT-PCR (Flu A&B, Covid) Nasopharyngeal Swab     Status: None   Collection Time: 03/06/21  4:56 PM   Specimen: Nasopharyngeal Swab; Nasopharyngeal(NP) swabs in vial transport medium  Result Value Ref Range Status   SARS Coronavirus 2 by RT PCR NEGATIVE NEGATIVE Final    Comment: (NOTE) SARS-CoV-2 target nucleic acids are NOT DETECTED.  The SARS-CoV-2 RNA is generally detectable in upper respiratory specimens during the acute phase of infection. The lowest concentration of SARS-CoV-2 viral copies this assay can detect is 138 copies/mL. A negative result does not preclude SARS-Cov-2 infection and should not be used as the sole basis for treatment or other patient management decisions. A negative result may occur with  improper specimen collection/handling, submission of specimen other than nasopharyngeal swab, presence of viral mutation(s) within the areas targeted by this assay, and inadequate number of viral copies(<138 copies/mL). A negative result must be combined with clinical observations, patient history, and epidemiological information. The expected result is Negative.  Fact Sheet for Patients:   EntrepreneurPulse.com.au  Fact Sheet for Healthcare Providers:  IncredibleEmployment.be  This test is no t yet approved or cleared by the [place] FDA and  has been authorized for detection and/or diagnosis of SARS-CoV-2 by FDA under an Emergency Use Authorization (EUA). This EUA will remain  in effect (meaning this test can be used) for the duration of the COVID-19 declaration under Section 564(b)(1) of the Act, 21 U.S.C.section 360bbb-3(b)(1), unless the authorization is terminated  or revoked sooner.       Influenza A by PCR NEGATIVE NEGATIVE Final   Influenza B by PCR NEGATIVE NEGATIVE Final    Comment: (NOTE) The Xpert Xpress SARS-CoV-2/FLU/RSV plus assay is intended as an aid in the diagnosis of influenza from  Nasopharyngeal swab specimens and should not be used as a sole basis for treatment. Nasal washings and aspirates are unacceptable for Xpert Xpress SARS-CoV-2/FLU/RSV testing.  Fact Sheet for Patients: EntrepreneurPulse.com.au  Fact Sheet for Healthcare Providers: IncredibleEmployment.be  This test is not yet approved or cleared by the [place] FDA and has been authorized for detection and/or diagnosis of SARS-CoV-2 by FDA under an Emergency Use Authorization (EUA). This EUA will remain in effect (meaning this test can be used) for the duration of the COVID-19 declaration under Section 564(b)(1) of the Act, 21 U.S.C. section 360bbb-3(b)(1), unless the authorization is terminated or revoked.  Performed at [facility], [address] [place], [place] [address]   MRSA Next Gen by PCR, Nasal     Status: None   Collection Time: 03/07/21  6:56 PM   Specimen: Nasal Mucosa; Nasal Swab  Result Value Ref Range Status   MRSA by PCR Next Gen NOT DETECTED NOT DETECTED Final    Comment: (NOTE) The GeneXpert MRSA Assay (FDA approved for NASAL specimens only), is one component of a  comprehensive MRSA colonization surveillance program. It is not intended to diagnose MRSA infection nor to guide or monitor treatment for MRSA infections. Test performance is not FDA approved in patients less than 42 years old. Performed at [facility], [address] [place], [place] [address]          Radiology Studies: CT ABDOMEN PELVIS W CONTRAST  Result Date: 03/07/2021 CLINICAL DATA:  75 year old with suspected bowel obstruction. Renal cell carcinoma. EXAM: CT ABDOMEN AND PELVIS WITH CONTRAST TECHNIQUE: Multidetector CT imaging of the abdomen and pelvis was performed using the standard protocol following bolus administration of intravenous contrast. CONTRAST:  160mL OMNIPAQUE IOHEXOL 300 MG/ML  SOLN COMPARISON:  CT 10/03/2020 FINDINGS: Lower chest: New 1.5 cm peripheral nodule in the right lower lobe on sequence 5, image 8. 5 mm peripheral nodule in the right lower lobe on image 13 may have been present on the exam from 10/03/2020. No significant pleural fluid. Hepatobiliary: Cholecystectomy. Normal appearance of the liver. No significant biliary dilatation. Pancreas: Unremarkable. No pancreatic ductal dilatation or surrounding inflammatory changes. Spleen: Normal in size without focal abnormality. Adrenals/Urinary Tract: Normal adrenal glands. Normal appearance of the left kidney without hydronephrosis. Normal appearance of the urinary bladder with mild distention. Again noted is a large mass involving the anterior aspect of the right kidney which appears to be involving the right renal sinus and hilum. The right renal mass measures 5.4 x 4.9 x 6.4 cm and previously measured 5.0 x 4.4 x 6.3 cm. Mild stranding around the right renal lesion that was not present on the previous examination. No evidence for rupture or hemorrhage. Stomach/Bowel: Prior gastric surgery with multiple surgical clips in the upper abdomen. Surgical findings are suggestive for a gastric bypass procedure. No  evidence for acute bowel inflammation or obstruction. Vascular/Lymphatic: Normal caliber of the abdominal aorta. Main visceral arteries are patent. Again noted are enlarged right retroperitoneal lymph nodes in the retrocaval region. Index lymph node on sequence 2 image 25 measures 2.0 x 2.3 cm and minimally changed. Right renal mass is abutting the right renal vein but no clear evidence of intravascular involvement. Reproductive: Again noted is a small uterus with peripherally calcified fibroids. No evidence for an adnexal mass. Other: Negative for ascites. Extensive postoperative changes along the anterior abdominal wall. Musculoskeletal: Stable lucency involving the T12 right pedicle region. Mild sclerosis along the left side of the L1 vertebral body  is also stable. No clear evidence for osseous metastatic disease. IMPRESSION: 1. Right renal mass is compatible with a renal cell carcinoma. This renal mass has slightly enlarged since 10/03/2020. The adjacent right retroperitoneal lymphadenopathy has minimally changed. However, there is a new 1.5 cm nodule in the right lower lobe. This is suggestive for new metastatic disease. 2. Mild stranding around the right renal mass without evidence of hemorrhage. 3. 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 No evidence for acute bowel inflammation or obstruction. Electronically Signed   By: Markus Daft M.D.   On: 03/07/2021 10:23        Scheduled Meds:   stroke: mapping our early stages of recovery book   Does not apply Once   apixaban  5 mg Oral BID   axitinib  5 mg Oral BID   insulin aspart  0-15 Units Subcutaneous TID WC   insulin aspart  0-5 Units Subcutaneous QHS   insulin glargine  27 Units Subcutaneous BID   metoprolol succinate  50 mg Oral Q breakfast   nystatin   Topical TID   sertraline  25 mg Oral Daily   Continuous Infusions:  diltiazem (CARDIZEM) infusion 5 mg/hr (03/08/21 1910)   magnesium sulfate bolus IVPB       LOS:  1 day    Time spent: 25 mins     Wyvonnia Dusky, MD [facility] Pager [phone]  If 7PM-7AM, please contact night-coverage [email or web address] Password [facility] 03/09/2021, 8:23 AM

## 2021-03-10 ENCOUNTER — Inpatient Hospital Stay: Payer: Medicare PPO | Admitting: Hospice and Palliative Medicine

## 2021-03-10 DIAGNOSIS — G9341 Metabolic encephalopathy: Secondary | ICD-10-CM

## 2021-03-10 DIAGNOSIS — N390 Urinary tract infection, site not specified: Secondary | ICD-10-CM

## 2021-03-10 LAB — CBC
HCT: 40.8 % (ref 36.0–46.0)
Hemoglobin: 13.1 g/dL (ref 12.0–15.0)
MCH: 26.6 pg (ref 26.0–34.0)
MCHC: 32.1 g/dL (ref 30.0–36.0)
MCV: 82.8 fL (ref 80.0–100.0)
Platelets: 255 10*3/uL (ref 150–400)
RBC: 4.93 MIL/uL (ref 3.87–5.11)
RDW: 14.3 % (ref 11.5–15.5)
WBC: 10.1 10*3/uL (ref 4.0–10.5)
nRBC: 0 % (ref 0.0–0.2)

## 2021-03-10 LAB — BASIC METABOLIC PANEL
Anion gap: 5 (ref 5–15)
BUN: 16 mg/dL (ref 8–23)
CO2: 29 mmol/L (ref 22–32)
Calcium: 10.4 mg/dL — ABNORMAL HIGH (ref 8.9–10.3)
Chloride: 104 mmol/L (ref 98–111)
Creatinine, Ser: 0.49 mg/dL (ref 0.44–1.00)
GFR, Estimated: >60 mL/min (ref >60)
Glucose, Bld: 148 mg/dL — ABNORMAL HIGH (ref 70–99)
Potassium: 4 mmol/L (ref 3.5–5.1)
Sodium: 138 mmol/L (ref 135–145)

## 2021-03-10 LAB — GLUCOSE, CAPILLARY
Glucose-Capillary: 153 mg/dL — ABNORMAL HIGH (ref 70–99)
Glucose-Capillary: 192 mg/dL — ABNORMAL HIGH (ref 70–99)
Glucose-Capillary: 173 mg/dL — ABNORMAL HIGH (ref 70–99)
Glucose-Capillary: 204 mg/dL — ABNORMAL HIGH (ref 70–99)

## 2021-03-10 LAB — HEMOGLOBIN A1C
Hgb A1c MFr Bld: 8.8 % — ABNORMAL HIGH (ref 4.8–5.6)
Mean Plasma Glucose: 206 mg/dL

## 2021-03-10 LAB — URINE CULTURE: Culture: >=100000 — AB

## 2021-03-10 LAB — MAGNESIUM: Magnesium: 1.8 mg/dL (ref 1.7–2.4)

## 2021-03-10 MED ORDER — CEFAZOLIN SODIUM-DEXTROSE 1-4 GM/50ML-% IV SOLN
1.0000 g | Freq: Three times a day (TID) | INTRAVENOUS | Status: DC
  Filled 2021-03-10 (×2): qty 50

## 2021-03-10 MED ORDER — SODIUM CHLORIDE 0.9 % IV SOLN
1.0000 g | Freq: Two times a day (BID) | INTRAVENOUS | Status: DC
  Filled 2021-03-10 (×2): qty 1

## 2021-03-10 MED ORDER — CEFAZOLIN SODIUM-DEXTROSE 1-4 GM/50ML-% IV SOLN
1.0000 g | Freq: Three times a day (TID) | INTRAVENOUS | Status: AC
  Administered 2021-03-10 – 2021-03-14 (×14): 1 g via INTRAVENOUS
  Filled 2021-03-10 (×15): qty 50

## 2021-03-10 MED ORDER — BISACODYL 10 MG RE SUPP
10.0000 mg | Freq: Every day | RECTAL | Status: DC
  Administered 2021-03-11 – 2021-03-15 (×2): 10 mg via RECTAL
  Filled 2021-03-10 (×4): qty 1

## 2021-03-10 NOTE — Progress Notes (Signed)
Pt not given PO meds this morning because pt is very lethargic. Dr Jimmye Norman made aware.

## 2021-03-10 NOTE — Progress Notes (Signed)
Cardizem has been titrated to 15mg /hr. Current vitals: BP 122/69, HR 98, RR 20. Will continue to monitor.

## 2021-03-10 NOTE — Progress Notes (Signed)
Pt continues to be extremely drowsy. Unable to follow commands. Can tell me her name. Cardizem drip continues at 15mg /hr. HR running in the 90's to low 100's. Small urine output, 250 ml this shift. No BM this shift. Report given to [facility].

## 2021-03-10 NOTE — Progress Notes (Signed)
Pt temp elevated to 100.2. Placed ice packs on pt and dropped thermostat in room. Will continue to monitor.

## 2021-03-10 NOTE — Progress Notes (Signed)
PT Cancellation Note  Patient Details Name: Barbara Frank MRN: [id number] DOB: Mar 12, 1946   Cancelled Treatment:     Per chart review and chat with SLP. Pt's cognition continues to be altered with HR instability noted. Cardizem drip started this date. Acute PT will continue to follow and progress as able per current POC. Will return tomorrow and treat if pt is appropriate.    Willette Pa 03/10/2021, 12:38 PM

## 2021-03-10 NOTE — Progress Notes (Addendum)
PROGRESS NOTE    Barbara Frank  WVP:[id number] DOB: 08-10-1946 DOA: 03/06/2021 PCP: Sheral Apley    Assessment & Plan:   Principal Problem:   Altered mental status Active Problems:   Atrial fibrillation ([place])   Depression   Diabetes type 2, uncontrolled ([place])   Renal cell carcinoma ([place])   Transient confusion   Intertrigo   Encephalopathy   Acute metabolic encephalopathy:  mental status is the same as day prior. Etiology likely secondary to UTI. Agitated and pulling out lines, continue w/ mittens. Urine drug screen was neg. Ethanol was neg. CT head shows generalized cerebral atrophy & no acute intracranial abnormalities. MRI brain shows no acute intracranial abnormalities. Blood cxs NGTD. Mental status is far from baseline as per pt's niece. Haldol, ativan prn.   UTI: urine cx is growing viridans strep. Started on IV cefazolin   Dysphagia: likely secondary metabolic encephalopathy & infection. NPO as per speech. Speech is following and recs apprec   Leukocytosis: resolved   Stage IV renal cell carcinoma: needs outpatient f/u w/ onco   Intertrigo: continue on nystatin powder    Likely PAF: w/ intermittent RVR. Continue on metoprolol, eliquis but pt intermittent refuses to take po meds. Continue on IV cardizem drip and wean as tolerated   Depression: severity unknown. Continue on home dose of sertraline when able to take po meds    DM2: likely poorly controlled. Continue on lantus, SSI w/ accuchecks    DVT prophylaxis: eliquis  Code Status:  full  Family Communication: discussed pt's care w/ pt's niece, [name] and answered her questions Disposition Plan: possible d/c to SNF   Level of care: Progressive Cardiac  Status is: Inpatient  Remains inpatient appropriate because:Altered mental status, IV treatments appropriate due to intensity of illness or inability to take PO, and Inpatient level of care appropriate due to severity of illness  Dispo: The patient is  from: ALF              Anticipated d/c is to: ALF vs SNF               Patient currently is not medically stable to d/c.   Difficult to place patient : unclear       Consultants:    Procedures:   Antimicrobials: cefazolin    Subjective: Pt is still confused and unable to answer questions appropriately   Objective: Vitals:   03/10/21 0400 03/10/21 0600 03/10/21 0745 03/10/21 0751  BP: 137/84 139/78 (!) 157/79 (!) 144/71  Pulse:   (!) 138 (!) 121  Resp:  16 (!) 24 (!) 22  Temp:   99.2 F (37.3 C)   TempSrc:   Axillary   SpO2:   98%   Weight:      Height:        Intake/Output Summary (Last 24 hours) at 03/10/2021 0801 Last data filed at 03/09/2021 1532 Gross per 24 hour  Intake 270.46 ml  Output --  Net 270.46 ml   Filed Weights   03/06/21 1623  Weight: 83.7 kg    Examination:  General exam: Appears confused but comfortable  Respiratory system: clear breath sounds b/l Cardiovascular system: S1/S2+. No rubs or clicks  Gastrointestinal system: Abd is soft, NT, obese & hypoactive bowel sounds Central nervous system:  Lethargic. Moves all extremities  Psychiatry: judgement and insight appears abnormal. Flat mood and affect   Data Reviewed: I have personally reviewed following labs and imaging studies  CBC: Recent Labs  Lab 03/06/21 1629  03/08/21 0944 03/09/21 0531 03/10/21 0607  WBC 5.7 11.4* 11.2* 10.1  NEUTROABS 3.7  --   --   --   HGB 12.9 13.9 13.8 13.1  HCT 39.3 42.9 41.7 40.8  MCV 82.7 83.5 82.9 82.8  PLT 213 245 262 937   Basic Metabolic Panel: Recent Labs  Lab 03/06/21 1629 03/07/21 0001 03/08/21 0944 03/09/21 0531 03/10/21 0607  NA 139  --  139 137 138  K 4.1  --  4.1 3.9 4.0  CL 107  --  104 102 104  CO2 26  --  26 26 29   GLUCOSE 282*  --  235* 193* 148*  BUN 13  --  17 18 16   CREATININE 0.50  --  0.50 0.51 0.49  CALCIUM 9.9  --  10.6* 10.4* 10.4*  MG  --  1.6* 1.8 1.6* 1.8   GFR: Estimated Creatinine Clearance: 61.8 mL/min (by  C-G formula based on SCr of 0.49 mg/dL). Liver Function Tests: No results for input(s): AST, ALT, ALKPHOS, BILITOT, PROT, ALBUMIN in the last 168 hours. No results for input(s): LIPASE, AMYLASE in the last 168 hours. Recent Labs  Lab 03/06/21 1629  AMMONIA 50*   Coagulation Profile: No results for input(s): INR, PROTIME in the last 168 hours. Cardiac Enzymes: No results for input(s): CKTOTAL, CKMB, CKMBINDEX, TROPONINI in the last 168 hours. BNP (last 3 results) No results for input(s): PROBNP in the last 8760 hours. HbA1C: No results for input(s): HGBA1C in the last 72 hours. CBG: Recent Labs  Lab 03/09/21 1138 03/09/21 1311 03/09/21 1601 03/09/21 2050 03/10/21 0754  GLUCAP 240* 270* 189* 224* 153*   Lipid Profile: No results for input(s): CHOL, HDL, LDLCALC, TRIG, CHOLHDL, LDLDIRECT in the last 72 hours.  Thyroid Function Tests: No results for input(s): TSH, T4TOTAL, FREET4, T3FREE, THYROIDAB in the last 72 hours.  Anemia Panel: No results for input(s): VITAMINB12, FOLATE, FERRITIN, TIBC, IRON, RETICCTPCT in the last 72 hours.  Sepsis Labs: No results for input(s): PROCALCITON, LATICACIDVEN in the last 168 hours.  Recent Results (from the past 240 hour(s))  Resp Panel by RT-PCR (Flu A&B, Covid) Nasopharyngeal Swab     Status: None   Collection Time: 03/06/21  4:56 PM   Specimen: Nasopharyngeal Swab; Nasopharyngeal(NP) swabs in vial transport medium  Result Value Ref Range Status   SARS Coronavirus 2 by RT PCR NEGATIVE NEGATIVE Final    Comment: (NOTE) SARS-CoV-2 target nucleic acids are NOT DETECTED.  The SARS-CoV-2 RNA is generally detectable in upper respiratory specimens during the acute phase of infection. The lowest concentration of SARS-CoV-2 viral copies this assay can detect is 138 copies/mL. A negative result does not preclude SARS-Cov-2 infection and should not be used as the sole basis for treatment or other patient management decisions. A negative  result may occur with  improper specimen collection/handling, submission of specimen other than nasopharyngeal swab, presence of viral mutation(s) within the areas targeted by this assay, and inadequate number of viral copies(<138 copies/mL). A negative result must be combined with clinical observations, patient history, and epidemiological information. The expected result is Negative.  Fact Sheet for Patients:  EntrepreneurPulse.com.au  Fact Sheet for Healthcare Providers:  IncredibleEmployment.be  This test is no t yet approved or cleared by the [place] FDA and  has been authorized for detection and/or diagnosis of SARS-CoV-2 by FDA under an Emergency Use Authorization (EUA). This EUA will remain  in effect (meaning this test can be used) for the duration of the COVID-19  declaration under Section 564(b)(1) of the Act, 21 U.S.C.section 360bbb-3(b)(1), unless the authorization is terminated  or revoked sooner.       Influenza A by PCR NEGATIVE NEGATIVE Final   Influenza B by PCR NEGATIVE NEGATIVE Final    Comment: (NOTE) The Xpert Xpress SARS-CoV-2/FLU/RSV plus assay is intended as an aid in the diagnosis of influenza from Nasopharyngeal swab specimens and should not be used as a sole basis for treatment. Nasal washings and aspirates are unacceptable for Xpert Xpress SARS-CoV-2/FLU/RSV testing.  Fact Sheet for Patients: EntrepreneurPulse.com.au  Fact Sheet for Healthcare Providers: IncredibleEmployment.be  This test is not yet approved or cleared by the [place] FDA and has been authorized for detection and/or diagnosis of SARS-CoV-2 by FDA under an Emergency Use Authorization (EUA). This EUA will remain in effect (meaning this test can be used) for the duration of the COVID-19 declaration under Section 564(b)(1) of the Act, 21 U.S.C. section 360bbb-3(b)(1), unless the authorization is  terminated or revoked.  Performed at [facility], [address] [place], [place] [address]   MRSA Next Gen by PCR, Nasal     Status: None   Collection Time: 03/07/21  6:56 PM   Specimen: Nasal Mucosa; Nasal Swab  Result Value Ref Range Status   MRSA by PCR Next Gen NOT DETECTED NOT DETECTED Final    Comment: (NOTE) The GeneXpert MRSA Assay (FDA approved for NASAL specimens only), is one component of a comprehensive MRSA colonization surveillance program. It is not intended to diagnose MRSA infection nor to guide or monitor treatment for MRSA infections. Test performance is not FDA approved in patients less than 74 years old. Performed at [facility], [address], [place], [place] [address]   Urine Culture     Status: None (Preliminary result)   Collection Time: 03/08/21  2:01 PM   Specimen: Urine, Random  Result Value Ref Range Status   Specimen Description   Final    URINE, RANDOM Performed at [facility], [address] [place], [place] [address]    Special Requests   Final    NONE Performed at [facility], [address] [place], [place] [address]    Culture   Final    CULTURE REINCUBATED FOR BETTER GROWTH Performed at [facility], [place] [address] [place], [place] [address]    Report Status PENDING  Incomplete         Radiology Studies: No results found.      Scheduled Meds:   stroke: mapping our early stages of recovery book   Does not apply Once   apixaban  5 mg Oral BID   bisacodyl  10 mg Rectal Daily   insulin aspart  0-15 Units Subcutaneous TID WC   insulin aspart  0-5 Units Subcutaneous QHS   insulin glargine  27 Units Subcutaneous BID   lactulose  20 g Oral BID   metoprolol succinate  50 mg Oral Q breakfast   nystatin   Topical TID   sertraline  25 mg Oral Daily   Continuous Infusions:  diltiazem (CARDIZEM) infusion 5 mg/hr (03/08/21 1910)     LOS: 2 days    Time spent: 25 mins      Wyvonnia Dusky, MD [facility] Pager [phone]  If 7PM-7AM, please contact night-coverage [email or web address] Password [email or web address] 03/10/2021, 8:01 AM

## 2021-03-10 NOTE — Evaluation (Signed)
Clinical/Bedside Swallow Evaluation Patient Details  Name: Barbara Frank MRN: [id number] Date of Birth: [birth year]  Today's Date: 03/10/2021 Time: SLP Start Time (ACUTE ONLY): 58 SLP Stop Time (ACUTE ONLY): 1105 SLP Time Calculation (min) (ACUTE ONLY): 50 min  Past Medical History:  Past Medical History:  Diagnosis Date   A-fib ([place])    Depression    Diabetes mellitus without complication ([place])    History of kidney stones    Kidney mass    Past Surgical History:  Past Surgical History:  Procedure Laterality Date   GASTRIC BYPASS     HERNIA REPAIR     TONSILLECTOMY     URETEROSCOPY WITH HOLMIUM LASER LITHOTRIPSY Left    HPI:  Pt is a 75 y.o. female with medical history significant for renal cell carcinoma on Keytruda and axitinib, metastatis, hypertension, hyperlipidemia, depression, insulin-dependent diabetes mellitus, atrial fibrillation, presents to the emergency department for chief concerns of confusion. Pt has chart history of multiple visits to [facility] for consultation and txs since 09/2020. Pt resides at ALF, [facility] [place](for ~1 year now post move from home on the [place]). Per chart notes, Niece reported that "they have noticed a little bit of Confusion for pt and a little bit of a change in the last year but not diagnose Dementia".  CXR and MRI: no acute abnormalities.   Assessment / Plan / Recommendation Clinical Impression  Pt presented w/ Significantly declined Cognitive/Mental status w/ much reduced awareness of self and her environment. Pt was noverbl except to give her name w/ Mod cues; she did not follow any instructions/commands. Moaning x2 noted when given increased stimulation during oral care attempted. Pt pressed lips and teeth together tightly preventing oral care from being completed. Noted a dry, sticky Dental presentation; unable to assess oral cavity d/t pt not opening her mouth despite MAX verbal/tactile cues given(w/ swabs at lips). Pt wore Bilat Mitts  and has a tele-Sitter present d/t agitated behavior. Due to increased Confusion and agitated behavior last shift, pt was given Ativan and Haldol per NSG report. Unsure of pt's Baseline Cognitive status prior to this admit -- it was reported that pt has decline in Cognition and a poor historian per Oncology MD notes in chart/visits.  Due to pt's presentation, NO po trials were given d/t HIGH RISK FOR ASPIRATION.  Due to pt's current presentation and declined Cognitive awareness for safe oral intake, recommend strict NPO status including meds. Recommend oral care frequently to engage oral stim, hygiene. ST services will f/u daily w/ pt's status for improvement and appropriateness to introduce oral intake again. NSG and MD updated. SLP Visit Diagnosis: Dysphagia, oropharyngeal phase (R13.12) (Cognitive decline)    Aspiration Risk  Severe aspiration risk;Risk for inadequate nutrition/hydration    Diet Recommendation  NPO including meds; frequent oral care for hygiene and stimulation of swallowing  Medication Administration: Via alternative means    Other  Recommendations Recommended Consults:  (Palliative Care consult; Dietician f/u) Oral Care Recommendations: Oral care QID;Staff/trained caregiver to provide oral care Other Recommendations:  (TBD)   Follow up Recommendations  (TBD)      Frequency and Duration min 3x week  2 weeks       Prognosis Prognosis for Safe Diet Advancement: Guarded Barriers to Reach Goals: Cognitive deficits;Language deficits;Time post onset;Severity of deficits;Behavior Barriers/Prognosis Comment: prior Cognitive decline suspected per chart notes; Ca      Swallow Study   General Date of Onset: 03/06/21 HPI: Pt is a 75 y.o. female with medical  history significant for renal cell carcinoma on Keytruda and axitinib, metastatis, hypertension, hyperlipidemia, depression, insulin-dependent diabetes mellitus, atrial fibrillation, presents to the emergency department for  chief concerns of confusion. Pt has chart history of multiple visits to [facility] for consultation and txs since 09/2020. Pt resides at ALF, [facility] [place](for ~1 year now post move from home on the [place]). Per chart notes, Niece reported that "they have noticed a little bit of Confusion for pt and a little bit of a change in the last year but not diagnose Dementia".  CXR and MRI: no acute abnormalities. Type of Study: Bedside Swallow Evaluation Previous Swallow Assessment: none Diet Prior to this Study: Dysphagia 1 (puree);Thin liquids (from Regular diet consistency at admit) Temperature Spikes Noted: No (wbc 10.1) Respiratory Status: Nasal cannula (2L) History of Recent Intubation: No Behavior/Cognition: Confused;Agitated;Uncooperative;Lethargic/Drowsy;Doesn't follow directions Oral Cavity Assessment:  (limited) Oral Care Completed by SLP: Yes (attempted) Oral Cavity - Dentition: Adequate natural dentition (viewable) Vision:  (n/a) Self-Feeding Abilities: Total assist Patient Positioning: Upright in bed (full positioning support given d/t Cognitive decline) Baseline Vocal Quality: Low vocal intensity (spoke only her name; moaning x2 afterwards) Volitional Cough: Cognitively unable to elicit Volitional Swallow: Unable to elicit    Oral/Motor/Sensory Function Overall Oral Motor/Sensory Function:  (no overt outward unilateral weakness(labial))   Ice Chips Ice chips: Not tested   Thin Liquid Thin Liquid: Not tested    Nectar Thick Nectar Thick Liquid: Not tested   Honey Thick Honey Thick Liquid: Not tested   Puree Puree: Not tested   Solid     Solid: Not tested        Orinda Kenner, MS, CCC-SLP Speech Language Pathologist Rehab Services [phone] Barbara Frank 03/10/2021,4:14 PM

## 2021-03-10 NOTE — Progress Notes (Signed)
Pts MEWS score is red because of HR 138, RR 24. Retake on vitals: HR 121, RR 22 which put pt at a yellow MEWS. Charge nurse Janett Billow and DR Jimmye Norman notified. Dr Jimmye Norman ordered cardizem dripped to be restarted. Cardizem drip restarted at 5mg /hr. Will continue to monitor.

## 2021-03-11 ENCOUNTER — Inpatient Hospital Stay: Payer: Medicare PPO

## 2021-03-11 DIAGNOSIS — E119 Type 2 diabetes mellitus without complications: Secondary | ICD-10-CM

## 2021-03-11 DIAGNOSIS — N3001 Acute cystitis with hematuria: Principal | ICD-10-CM

## 2021-03-11 DIAGNOSIS — Z794 Long term (current) use of insulin: Secondary | ICD-10-CM

## 2021-03-11 DIAGNOSIS — I4891 Unspecified atrial fibrillation: Secondary | ICD-10-CM

## 2021-03-11 DIAGNOSIS — R7989 Other specified abnormal findings of blood chemistry: Secondary | ICD-10-CM

## 2021-03-11 LAB — CBC
HCT: 41.4 % (ref 36.0–46.0)
Hemoglobin: 13.1 g/dL (ref 12.0–15.0)
MCH: 27.4 pg (ref 26.0–34.0)
MCHC: 31.6 g/dL (ref 30.0–36.0)
MCV: 86.6 fL (ref 80.0–100.0)
Platelets: 237 10*3/uL (ref 150–400)
RBC: 4.78 MIL/uL (ref 3.87–5.11)
RDW: 14.4 % (ref 11.5–15.5)
WBC: 9.9 10*3/uL (ref 4.0–10.5)
nRBC: 0 % (ref 0.0–0.2)

## 2021-03-11 LAB — BASIC METABOLIC PANEL
Anion gap: 9 (ref 5–15)
BUN: 18 mg/dL (ref 8–23)
CO2: 24 mmol/L (ref 22–32)
Calcium: 10.3 mg/dL (ref 8.9–10.3)
Chloride: 102 mmol/L (ref 98–111)
Creatinine, Ser: 0.45 mg/dL (ref 0.44–1.00)
GFR, Estimated: >60 mL/min (ref >60)
Glucose, Bld: 162 mg/dL — ABNORMAL HIGH (ref 70–99)
Potassium: 4.1 mmol/L (ref 3.5–5.1)
Sodium: 135 mmol/L (ref 135–145)

## 2021-03-11 LAB — MAGNESIUM: Magnesium: 1.8 mg/dL (ref 1.7–2.4)

## 2021-03-11 LAB — GLUCOSE, CAPILLARY
Glucose-Capillary: 135 mg/dL — ABNORMAL HIGH (ref 70–99)
Glucose-Capillary: 138 mg/dL — ABNORMAL HIGH (ref 70–99)
Glucose-Capillary: 157 mg/dL — ABNORMAL HIGH (ref 70–99)
Glucose-Capillary: 291 mg/dL — ABNORMAL HIGH (ref 70–99)

## 2021-03-11 MED ORDER — INSULIN GLARGINE 100 UNIT/ML ~~LOC~~ SOLN
15.0000 [IU] | Freq: Two times a day (BID) | SUBCUTANEOUS | Status: DC
  Administered 2021-03-11 – 2021-03-12 (×2): 15 [IU] via SUBCUTANEOUS
  Filled 2021-03-11 (×3): qty 0.15

## 2021-03-11 MED ORDER — SODIUM CHLORIDE 0.9 % IV SOLN: INTRAVENOUS | Status: DC

## 2021-03-11 MED ORDER — APIXABAN 5 MG PO TABS
5.0000 mg | ORAL_TABLET | Freq: Two times a day (BID) | ORAL | Status: DC
  Administered 2021-03-12 – 2021-03-16 (×9): 5 mg via ORAL
  Filled 2021-03-11 (×9): qty 1

## 2021-03-11 MED ORDER — DICLOFENAC SODIUM 1 % EX GEL
4.0000 g | Freq: Four times a day (QID) | CUTANEOUS | Status: DC | PRN
  Administered 2021-03-12 – 2021-03-16 (×4): 4 g via TOPICAL
  Filled 2021-03-11: qty 100

## 2021-03-11 NOTE — Progress Notes (Signed)
   03/10/21 0745  Assess: MEWS Score  Temp 99.2 F (37.3 C)  BP (!) 157/79  Pulse Rate (!) 138  Resp (!) 24  SpO2 98 %  O2 Device Room Air  Assess: MEWS Score  MEWS Temp 0  MEWS Systolic 0  MEWS Pulse 3  MEWS RR 1  MEWS LOC 0  MEWS Score 4  MEWS Score Color Red  Assess: if the MEWS score is Yellow or Red  Were vital signs taken at a resting state? Yes  Focused Assessment No change from prior assessment  Does the patient meet 2 or more of the SIRS criteria? No  MEWS guidelines implemented *See Row Information* Yes  Treat  MEWS Interventions Escalated (See documentation below)  Take Vital Signs  Increase Vital Sign Frequency  Red: Q 1hr X 4 then Q 4hr X 4, if remains red, continue Q 4hrs  Escalate  MEWS: Escalate Red: discuss with charge nurse/RN and provider, consider discussing with RRT  Notify: Charge Nurse/RN  Name of Charge Nurse/RN Notified Janett Billow  Date Charge Nurse/RN Notified 03/10/21  Time Charge Nurse/RN Notified 0750  Notify: Provider  Provider Name/Title [place]  Date Provider Notified 03/10/21  Time Provider Notified [phone]  Notification Type  (Secure chat)  Notification Reason Change in status  Provider response See new orders  Date of Provider Response 03/10/21  Time of Provider Response 0750  Assess: SIRS CRITERIA  SIRS Temperature  0  SIRS Pulse 1  SIRS Respirations  1  SIRS WBC 0  SIRS Score Sum  2  Inserted for Margaret Pyle RN

## 2021-03-11 NOTE — Progress Notes (Signed)
Physical Therapy Treatment Patient Details Name: Barbara Frank MRN: [id number] DOB: 30-May-1946 Today's Date: 03/11/2021    History of Present Illness Pt is a 75 y/o F admitted on 03/06/21 from [facility] for "feeling confused". Pt was found to be in a-fib with RVR. Head imaging negative for acute abnormality. Pt being treated for confusion likely due to obstipation. PMH: a-fib, depression, DM, renal CA    PT Comments    Pt was asleep in side lying upon arriving. She easily awake and is oriented to self only. Inconsistently follows commands. Answers yes/no questioning more appropriately however still inconsistent. Required constant assistance for participation. Max assist to achieve EOB short sit with constant vcs for full effort. Pt moans and groans throughout all mobility. Only able to tolerate sitting EOB x ~ 5 minutes. Severe posterior lean in sitting noted. Due to pain, pt quickly returned to bed via total assist. Pt will require extensive PT going forward to decrease caregiver burden while assisting pt to PLOF. RN/SLP discussed case. Acute PT will continue to follow and progress as able per pt tolerance.   Follow Up Recommendations  SNF;Supervision - Intermittent;Other (comment) (may need long term care)     Equipment Recommendations  Other (comment) (defer to next level of care)       Precautions / Restrictions Precautions Precautions: Fall Restrictions Weight Bearing Restrictions: No    Mobility  Bed Mobility Overal bed mobility: Needs Assistance Bed Mobility: Rolling;Supine to Sit;Sit to Supine Rolling: Max assist   Supine to sit: Max assist Sit to supine: Total assist (due to pain/inability to follow commands)   General bed mobility comments: pt very fearful, resistant to moving    Transfers    General transfer comment: unsafe to attempt standing/OOB activity        Balance Overall balance assessment: Needs assistance Sitting-balance support: Feet  unsupported;Bilateral upper extremity supported Sitting balance-Leahy Scale: Zero Sitting balance - Comments: pt required constat assistance to prevent LOB Postural control: Posterior lean        Cognition Arousal/Alertness: Awake/alert Behavior During Therapy: Flat affect;Anxious Overall Cognitive Status: No family/caregiver present to determine baseline cognitive functioning      General Comments: severe cognitive deficits. oriented to self only. INconsistently follows commands . Moans/groans alot             Pertinent Vitals/Pain Pain Assessment: Faces Faces Pain Scale: Hurts whole lot Pain Location:  (unable to state where pain is located) Pain Descriptors / Indicators: Grimacing Pain Intervention(s): Limited activity within patient's tolerance;Monitored during session;Premedicated before session;Repositioned     PT Goals (current goals can now be found in the care plan section) Acute Rehab PT Goals Patient Stated Goal: none stated Progress towards PT goals: Not progressing toward goals - comment (cognition/pain/ prognosis)    Frequency    Min 2X/week      PT Plan Current plan remains appropriate       AM-PAC PT "6 Clicks" Mobility   Outcome Measure  Help needed turning from your back to your side while in a flat bed without using bedrails?: A Lot Help needed moving from lying on your back to sitting on the side of a flat bed without using bedrails?: Total Help needed moving to and from a bed to a chair (including a wheelchair)?: Total Help needed standing up from a chair using your arms (e.g., wheelchair or bedside chair)?: Total Help needed to walk in hospital room?: Total Help needed climbing 3-5 steps with a railing? : Total 6  Click Score: 7    End of Session   Activity Tolerance: Patient limited by pain Patient left: in bed;with call bell/phone within reach;with bed alarm set Nurse Communication: Mobility status PT Visit Diagnosis: Muscle weakness  (generalized) (M62.81);Difficulty in walking, not elsewhere classified (R26.2);Unsteadiness on feet (R26.81)     Time: 1157-2620 PT Time Calculation (min) (ACUTE ONLY): 13 min  Charges:  $Therapeutic Activity: 8-22 mins                    Julaine Fusi PTA 03/11/21, 10:17 AM

## 2021-03-11 NOTE — Plan of Care (Signed)

## 2021-03-11 NOTE — Progress Notes (Addendum)
Pt continues to be alert and talking but confused. Did well with dinner, ate 50%. Continues on cardizem drip at 7.5mg /hr. Vital signs stable. Very little urine output this shift, approx 200 ml. Suppository given but no BM this shift. Will continue to monitor.

## 2021-03-11 NOTE — Plan of Care (Signed)
  Problem: Education: Goal: Knowledge of General Education information will improve Description: Including pain rating scale, medication(s)/side effects and non-pharmacologic comfort measures 03/11/2021 0132 by Larwance Rote, RN Outcome: Not Progressing 03/11/2021 0132 by Larwance Rote, RN Outcome: Not Progressing   Problem: Health Behavior/Discharge Planning: Goal: Ability to manage health-related needs will improve 03/11/2021 0132 by Larwance Rote, RN Outcome: Not Progressing 03/11/2021 0132 by Larwance Rote, RN Outcome: Not Progressing   Problem: Clinical Measurements: Goal: Ability to maintain clinical measurements within normal limits will improve 03/11/2021 0132 by Larwance Rote, RN Outcome: Not Progressing 03/11/2021 0132 by Larwance Rote, RN Outcome: Not Progressing Goal: Will remain free from infection 03/11/2021 0132 by Larwance Rote, RN Outcome: Not Progressing 03/11/2021 0132 by Larwance Rote, RN Outcome: Not Progressing Goal: Diagnostic test results will improve 03/11/2021 0132 by Larwance Rote, RN Outcome: Not Progressing 03/11/2021 0132 by Larwance Rote, RN Outcome: Not Progressing Goal: Respiratory complications will improve 03/11/2021 0132 by Larwance Rote, RN Outcome: Not Progressing 03/11/2021 0132 by Larwance Rote, RN Outcome: Not Progressing Goal: Cardiovascular complication will be avoided 03/11/2021 0132 by Larwance Rote, RN Outcome: Not Progressing 03/11/2021 0132 by Larwance Rote, RN Outcome: Not Progressing   Problem: Activity: Goal: Risk for activity intolerance will decrease 03/11/2021 0132 by Larwance Rote, RN Outcome: Not Progressing 03/11/2021 0132 by Larwance Rote, RN Outcome: Not Progressing   Problem: Nutrition: Goal: Adequate nutrition will be maintained 03/11/2021 0132 by Larwance Rote, RN Outcome: Not Progressing 03/11/2021 0132 by Larwance Rote, RN Outcome: Not Progressing   Problem: Coping: Goal: Level of anxiety will decrease 03/11/2021  0132 by Larwance Rote, RN Outcome: Not Progressing 03/11/2021 0132 by Larwance Rote, RN Outcome: Not Progressing   Problem: Elimination: Goal: Will not experience complications related to bowel motility Outcome: Not Progressing Goal: Will not experience complications related to urinary retention Outcome: Not Progressing   Problem: Pain Managment: Goal: General experience of comfort will improve Outcome: Not Progressing   Problem: Safety: Goal: Ability to remain free from injury will improve Outcome: Not Progressing   Problem: Skin Integrity: Goal: Risk for impaired skin integrity will decrease Outcome: Not Progressing

## 2021-03-11 NOTE — Progress Notes (Signed)
   03/11/21 2000  Assess: MEWS Score  Temp 97.8 F (36.6 C)  BP (!) 156/67  Pulse Rate 96  ECG Heart Rate 94  Resp (!) 28  SpO2 95 %  O2 Device Room Air  Assess: MEWS Score  MEWS Temp 0  MEWS Systolic 0  MEWS Pulse 0  MEWS RR 2  MEWS LOC 1  MEWS Score 3  MEWS Score Color Yellow  Assess: if the MEWS score is Yellow or Red  Were vital signs taken at a resting state? Yes  Focused Assessment No change from prior assessment  Does the patient meet 2 or more of the SIRS criteria? Yes  Does the patient have a confirmed or suspected source of infection? Yes  Provider and Rapid Response Notified? Yes  MEWS guidelines implemented *See Row Information* Yes  Treat  MEWS Interventions Escalated (See documentation below)  Pain Scale PAINAD  Breathing 1  Negative Vocalization 1  Facial Expression 0  Body Language 0  Consolability 0  PAINAD Score 2  Take Vital Signs  Increase Vital Sign Frequency  Yellow: Q 2hr X 2 then Q 4hr X 2, if remains yellow, continue Q 4hrs  Escalate  MEWS: Escalate Yellow: discuss with charge nurse/RN and consider discussing with provider and RRT  Notify: Charge Nurse/RN  Name of Charge Nurse/RN Notified traci  Date Charge Nurse/RN Notified 03/11/21  Time Charge Nurse/RN Notified 2215  Notify: Provider  Provider Name/Title [place]  Date Provider Notified 03/11/21  Time Provider Notified 2150  Notification Type Rounds (secure chat)  Notification Reason Other (Comment) (mews protocol)  Provider response No new orders  Date of Provider Response 03/11/21 (read message)  Document  Patient Outcome Other (Comment) (no changes)  Assess: SIRS CRITERIA  SIRS Temperature  0  SIRS Pulse 1  SIRS Respirations  1  SIRS WBC 0  SIRS Score Sum  2

## 2021-03-11 NOTE — Progress Notes (Signed)
Patient ID: Barbara Frank, female   DOB: June 09, 1946, 75 y.o.   MRN: [id number] [facility] PROGRESS NOTE  Barbara Frank WNI:[id number] DOB: 01/13/1946 DOA: 03/06/2021 PCP: Rob Bunting L  HPI/Subjective: Patient did answer a couple questions today but still lethargic.  Spoke with the patient's niece on the phone and she states that prior to coming into the hospital she is usually very independent.  Now on medications for urinary infection.  Objective: Vitals:   03/11/21 0738 03/11/21 1200  BP: 135/67 121/61  Pulse: 94 86  Resp: 20 20  Temp: 98.9 F (37.2 C) 97.6 F (36.4 C)  SpO2: 100% 95%    Intake/Output Summary (Last 24 hours) at 03/11/2021 1432 Last data filed at 03/11/2021 0900 Gross per 24 hour  Intake 109.04 ml  Output 620 ml  Net -510.96 ml   Filed Weights   03/06/21 1623  Weight: 83.7 kg    ROS: Review of Systems  Respiratory:  Negative for shortness of breath.   Cardiovascular:  Negative for palpitations.  Gastrointestinal:  Negative for abdominal pain, nausea and vomiting.  Musculoskeletal:  Positive for joint pain.  Exam: Physical Exam HENT:     Head: Normocephalic.     Mouth/Throat:     Pharynx: No oropharyngeal exudate.  Eyes:     General: Lids are normal.     Conjunctiva/sclera: Conjunctivae normal.  Cardiovascular:     Rate and Rhythm: Normal rate and regular rhythm.     Heart sounds: Normal heart sounds, S1 normal and S2 normal.  Pulmonary:     Breath sounds: Normal breath sounds. No decreased breath sounds, wheezing, rhonchi or rales.  Abdominal:     Palpations: Abdomen is soft.     Tenderness: There is no abdominal tenderness.  Musculoskeletal:     Right lower leg: No swelling.     Left lower leg: No swelling.  Skin:    General: Skin is warm.     Findings: No rash.  Neurological:     Mental Status: She is lethargic.     Data Reviewed: Basic Metabolic Panel: Recent Labs  Lab 03/06/21 1629 03/07/21 0001 03/08/21 0944  03/09/21 0531 03/10/21 0607 03/11/21 0515  NA 139  --  139 137 138 135  K 4.1  --  4.1 3.9 4.0 4.1  CL 107  --  104 102 104 102  CO2 26  --  26 26 29 24   GLUCOSE 282*  --  235* 193* 148* 162*  BUN 13  --  17 18 16 18   CREATININE 0.50  --  0.50 0.51 0.49 0.45  CALCIUM 9.9  --  10.6* 10.4* 10.4* 10.3  MG  --  1.6* 1.8 1.6* 1.8 1.8    Recent Labs  Lab 03/06/21 1629  AMMONIA 50*   CBC: Recent Labs  Lab 03/06/21 1629 03/08/21 0944 03/09/21 0531 03/10/21 0607 03/11/21 0515  WBC 5.7 11.4* 11.2* 10.1 9.9  NEUTROABS 3.7  --   --   --   --   HGB 12.9 13.9 13.8 13.1 13.1  HCT 39.3 42.9 41.7 40.8 41.4  MCV 82.7 83.5 82.9 82.8 86.6  PLT 213 245 262 255 237     CBG: Recent Labs  Lab 03/10/21 1148 03/10/21 1558 03/10/21 2203 03/11/21 0737 03/11/21 1218  GLUCAP 192* 173* 204* 135* 138*    Recent Results (from the past 240 hour(s))  Resp Panel by RT-PCR (Flu A&B, Covid) Nasopharyngeal Swab     Status: None   Collection Time: 03/06/21  4:56 PM   Specimen: Nasopharyngeal Swab; Nasopharyngeal(NP) swabs in vial transport medium  Result Value Ref Range Status   SARS Coronavirus 2 by RT PCR NEGATIVE NEGATIVE Final    Comment: (NOTE) SARS-CoV-2 target nucleic acids are NOT DETECTED.  The SARS-CoV-2 RNA is generally detectable in upper respiratory specimens during the acute phase of infection. The lowest concentration of SARS-CoV-2 viral copies this assay can detect is 138 copies/mL. A negative result does not preclude SARS-Cov-2 infection and should not be used as the sole basis for treatment or other patient management decisions. A negative result may occur with  improper specimen collection/handling, submission of specimen other than nasopharyngeal swab, presence of viral mutation(s) within the areas targeted by this assay, and inadequate number of viral copies(<138 copies/mL). A negative result must be combined with clinical observations, patient history, and  epidemiological information. The expected result is Negative.  Fact Sheet for Patients:  EntrepreneurPulse.com.au  Fact Sheet for Healthcare Providers:  IncredibleEmployment.be  This test is no t yet approved or cleared by the [place] FDA and  has been authorized for detection and/or diagnosis of SARS-CoV-2 by FDA under an Emergency Use Authorization (EUA). This EUA will remain  in effect (meaning this test can be used) for the duration of the COVID-19 declaration under Section 564(b)(1) of the Act, 21 U.S.C.section 360bbb-3(b)(1), unless the authorization is terminated  or revoked sooner.       Influenza A by PCR NEGATIVE NEGATIVE Final   Influenza B by PCR NEGATIVE NEGATIVE Final    Comment: (NOTE) The Xpert Xpress SARS-CoV-2/FLU/RSV plus assay is intended as an aid in the diagnosis of influenza from Nasopharyngeal swab specimens and should not be used as a sole basis for treatment. Nasal washings and aspirates are unacceptable for Xpert Xpress SARS-CoV-2/FLU/RSV testing.  Fact Sheet for Patients: EntrepreneurPulse.com.au  Fact Sheet for Healthcare Providers: IncredibleEmployment.be  This test is not yet approved or cleared by the [place] FDA and has been authorized for detection and/or diagnosis of SARS-CoV-2 by FDA under an Emergency Use Authorization (EUA). This EUA will remain in effect (meaning this test can be used) for the duration of the COVID-19 declaration under Section 564(b)(1) of the Act, 21 U.S.C. section 360bbb-3(b)(1), unless the authorization is terminated or revoked.  Performed at [facility], [address] [place], [place] [address]   MRSA Next Gen by PCR, Nasal     Status: None   Collection Time: 03/07/21  6:56 PM   Specimen: Nasal Mucosa; Nasal Swab  Result Value Ref Range Status   MRSA by PCR Next Gen NOT DETECTED NOT DETECTED Final    Comment:  (NOTE) The GeneXpert MRSA Assay (FDA approved for NASAL specimens only), is one component of a comprehensive MRSA colonization surveillance program. It is not intended to diagnose MRSA infection nor to guide or monitor treatment for MRSA infections. Test performance is not FDA approved in patients less than 8 years old. Performed at [facility], [address], [place], [place] [address]   Urine Culture     Status: Abnormal   Collection Time: 03/08/21  2:01 PM   Specimen: Urine, Random  Result Value Ref Range Status   Specimen Description   Final    URINE, RANDOM Performed at [facility], [address] [place], [place] [address]    Special Requests   Final    NONE Performed at [facility], [address] [place], [place] [address]    Culture (A)  Final    >=100,000 COLONIES/mL VIRIDANS STREPTOCOCCUS Standardized susceptibility testing for this organism is not available. Performed at [facility], [place] [address], [place], [place] [address]    Report Status 03/10/2021 FINAL  Final  CULTURE, BLOOD (ROUTINE X 2) w Reflex to ID Panel     Status: None (Preliminary result)   Collection Time: 03/09/21 10:08 AM   Specimen: BLOOD  Result Value Ref Range Status   Specimen Description BLOOD BLOOD RIGHT HAND  Final   Special Requests   Final    BOTTLES DRAWN AEROBIC AND ANAEROBIC Blood Culture adequate volume   Culture   Final    NO GROWTH 2 DAYS Performed at [facility], [address], [place], [place] [address]    Report Status PENDING  Incomplete  CULTURE, BLOOD (ROUTINE X 2) w Reflex to ID Panel     Status: None (Preliminary result)   Collection Time: 03/09/21 10:16 AM   Specimen: BLOOD  Result Value Ref Range Status   Specimen Description BLOOD LEFT ANTECUBITAL  Final   Special Requests   Final    BOTTLES DRAWN AEROBIC AND ANAEROBIC Blood Culture adequate volume   Culture   Final    NO GROWTH 2 DAYS Performed at [facility], [address] [place], [place] [address]    Report Status PENDING  Incomplete     Studies: DG Knee 1-2 Views Left  Result Date: 03/11/2021 CLINICAL DATA:  History of pain. EXAM: LEFT KNEE - 1-2 VIEW COMPARISON:  No recent. FINDINGS: Diffuse osteopenia. Diffuse tricompartment with chondrocalcinosis. No evidence of fracture or dislocation. Small knee joint effusion. IMPRESSION: 1. Diffuse osteopenia. Diffuse tricompartment degenerative change with chondrocalcinosis. No acute bony abnormality. 2.  Small knee joint effusion. Electronically Signed   By: Marcello Moores  Register   On: 03/11/2021 13:41   DG HIP UNILAT WITH PELVIS 2-3 VIEWS LEFT  Result Date: 03/11/2021 CLINICAL DATA:  History of pain. EXAM: DG HIP (WITH OR WITHOUT PELVIS) 2-3V LEFT COMPARISON:  No prior. FINDINGS: Surgical clips noted over the pelvis. Degenerative changes lumbar spine and both hips. No acute bony abnormality. No evidence of fracture or dislocation. IMPRESSION: Diffuse osteopenia and degenerative change. No acute bony or joint abnormality identified. No evidence of fracture or dislocation. Electronically Signed   By: Marcello Moores  Register   On: 03/11/2021 13:43    Scheduled Meds:   stroke: mapping our early stages of recovery book   Does not apply Once   apixaban  5 mg Oral BID   bisacodyl  10 mg Rectal Daily   insulin aspart  0-15 Units Subcutaneous TID WC   insulin aspart  0-5 Units Subcutaneous QHS   insulin glargine  27 Units Subcutaneous BID   lactulose  20 g Oral BID   metoprolol succinate  50 mg Oral Q breakfast   nystatin   Topical TID   sertraline  25 mg Oral Daily   Continuous Infusions:  sodium chloride 50 mL/hr at 03/11/21 1148    ceFAZolin (ANCEF) IV 1 g (03/11/21 1404)   diltiazem (CARDIZEM) infusion 10 mg/hr (03/11/21 1328)    Assessment/Plan:  Acute delirium.  Could be secondary to urinary tract infection versus elevated ammonia level versus new medication.  Patient placed on dysphagia  diet will need full feeding.  MRI of the brain without contrast was negative. UTI with strep viridans on IV cefazolin Atrial fibrillation with rapid ventricular response on Cardizem drip.  Nursing staff to see if she can take  her Eliquis.  Also on metoprolol. Type 2 diabetes mellitus.  Decrease glargine insulin dose secondary to poor appetite and intake Dysphagia diet Elevated ammonia level on lactulose if able to take.  Repeat ammonia level tomorrow. History of renal cell carcinoma.  Started on a new medication recently.  Holding this new medication currently.  MRI of the brain without contrast was negative.        Code Status:     Code Status Orders  (From admission, onward)           Start     Ordered   03/06/21 2037  Full code  Continuous        03/06/21 2037           Code Status History     This patient has a current code status but no historical code status.      Advance Directive Documentation    Flowsheet Row Most Recent Value  Type of Advance Directive Living will, Healthcare Power of Attorney  Pre-existing out of facility DNR order (yellow form or pink MOST form) --  "MOST" Form in Place? --      Family Communication: Spoke with niece on the phone Disposition Plan: Status is: Inpatient  Dispo: The patient is from: [facility] assisted living              Anticipated d/c is to: To be determined at this time              Patient currently responsive enough only to take a few bites this morning.   Difficult to place patient.  Hopefully not  Time spent: 28 minutes  [place]

## 2021-03-11 NOTE — Progress Notes (Signed)
Pt has had intermittent periods of alertness vs lethargy this shift. Currently pt is alert and speaking to me in full sentences. Pt was able to eat a cup of applesauce with dose of eliquis. Pt states that she has no pain at this time. Cardizem has been titrated down to 10mg /hr. HR running between High 80's to high 90's. Will continue to monitor.

## 2021-03-11 NOTE — Progress Notes (Signed)
Speech Language Pathology Treatment: Dysphagia  Patient Details Name: Barbara Frank MRN: [id number] DOB: 10/22/1945 Today's Date: 03/11/2021 Time: 3664-4034 SLP Time Calculation (min) (ACUTE ONLY): 50 min  Assessment / Plan / Recommendation Clinical Impression  Pt seen for ongoing assessment of oropharyngeal phase swallowing function and appropriateness for an oral diet. Per MD note, pt's Cognitive decline could be impacted by "urinary tract infection versus elevated ammonia level versus new medication"; unsure of pt's Baseline Cognitive status in general d/t chart history.  Pt appears to present w/ oropharyngeal phase dysphagia in light of Significantly declined Cognitive status. This impacts her overall awareness/engagement and safety during po tasks which increases risk for aspiration, choking. Pt's risk for aspiration, as well as concern for meeting nutritional needs fully, is present but can be reduced when following general aspiration precautions, using a modified diet consistency, and when given feeding assistance. She requires Mod++ tactile/verbal/ visual cues for orientation to bolus presentation, and during feeding support(she often maintained closed eyes and mumbled speech during the eval). Pt consumed a few trials of ice chips, purees, and TSPs of Nectar liquids w/ No immediate, overt clinical s/s of aspiration noted; no decline in vocal quality during few phonations/verbalizations, no cough, and no decline in respiratory status during/post trials. Oral phase was c/b min increased oral phase time for bolus management, transfer for swallowing, and oral clearing of the boluses given. SLP and NSG attempted Crushed pill in Puree during session w/ a puree. Min-Mod confusion w/ attempted oral care and did not open mouth for full care.   D/t pt's declined Cognitive status and her risk for aspiration, recommend initiation of the dysphagia level 1(puree) w/ Nectar liquids VIA TSP; aspiration  precautions; reduce Distractions during meals and only give po's when pt is fully alert/awake and engaged. Feeding at meals -- check for oral clearing during/post intake. Pills CRUSHED in Puree for safer swallowing. NSG updated. ST services recommends follow w/ Palliative Care for [place]. Dietician f/u for support. ST services can follow to monitor readiness for any upgrade of diet while admitted. Largely suspect that pt's Cognitive decline could continue to hamper oral intake and upgrade of diet. Precautions posted in room.       HPI HPI: Pt is a 75 y.o. female with medical history significant for renal cell carcinoma on Keytruda and axitinib, metastatis, hypertension, hyperlipidemia, depression, insulin-dependent diabetes mellitus, atrial fibrillation, presents to the emergency department for chief concerns of confusion. Pt has chart history of multiple visits to [facility] for consultation and txs since 09/2020. Pt resides at ALF, [facility](for ~1 year now post move from home on the [employer]). Per chart notes, Niece reported that "they have noticed a little bit of Confusion for pt and a little bit of a change in the last year but not diagnose Dementia".  CXR and MRI: no acute abnormalities.      SLP Plan  Continue with current plan of care       Recommendations  Diet recommendations: Dysphagia 1 (puree);Nectar-thick liquid Liquids provided via: Teaspoon Medication Administration: Crushed with puree Supervision: Staff to assist with self feeding;Full supervision/cueing for compensatory strategies Compensations: Minimize environmental distractions;Slow rate;Small sips/bites;Lingual sweep for clearance of pocketing;Multiple dry swallows after each bite/sip;Follow solids with liquid Postural Changes and/or Swallow Maneuvers: Seated upright 90 degrees;Upright 30-60 min after meal                General recommendations:  (Dietician f/u; Palliative Care f/u) Oral Care Recommendations: Oral care  BID;Oral care before  and after PO;Staff/trained caregiver to provide oral care Follow up Recommendations: Skilled Nursing facility (TBD) SLP Visit Diagnosis: Dysphagia, oropharyngeal phase (R13.12) (Cognitive decline impacting) Plan: Continue with current plan of care       GO                 Orinda Kenner, [place], [place] Pathologist Rehab Services [phone] [facility] 03/11/2021, 3:44 PM

## 2021-03-12 DIAGNOSIS — R531 Weakness: Secondary | ICD-10-CM

## 2021-03-12 DIAGNOSIS — R4182 Altered mental status, unspecified: Secondary | ICD-10-CM

## 2021-03-12 DIAGNOSIS — Z515 Encounter for palliative care: Secondary | ICD-10-CM

## 2021-03-12 LAB — GLUCOSE, CAPILLARY
Glucose-Capillary: 245 mg/dL — ABNORMAL HIGH (ref 70–99)
Glucose-Capillary: 343 mg/dL — ABNORMAL HIGH (ref 70–99)
Glucose-Capillary: 312 mg/dL — ABNORMAL HIGH (ref 70–99)
Glucose-Capillary: 236 mg/dL — ABNORMAL HIGH (ref 70–99)

## 2021-03-12 LAB — BASIC METABOLIC PANEL
Anion gap: 6 (ref 5–15)
BUN: 19 mg/dL (ref 8–23)
CO2: 28 mmol/L (ref 22–32)
Calcium: 10.1 mg/dL (ref 8.9–10.3)
Chloride: 102 mmol/L (ref 98–111)
Creatinine, Ser: 0.36 mg/dL — ABNORMAL LOW (ref 0.44–1.00)
GFR, Estimated: >60 mL/min (ref >60)
Glucose, Bld: 275 mg/dL — ABNORMAL HIGH (ref 70–99)
Potassium: 3.7 mmol/L (ref 3.5–5.1)
Sodium: 136 mmol/L (ref 135–145)

## 2021-03-12 LAB — AMMONIA: Ammonia: 26 umol/L (ref 9–35)

## 2021-03-12 MED ORDER — INSULIN GLARGINE 100 UNIT/ML ~~LOC~~ SOLN
24.0000 [IU] | Freq: Two times a day (BID) | SUBCUTANEOUS | Status: DC
  Administered 2021-03-12 – 2021-03-13 (×2): 24 [IU] via SUBCUTANEOUS
  Filled 2021-03-12 (×3): qty 0.24

## 2021-03-12 MED ORDER — DILTIAZEM HCL ER COATED BEADS 180 MG PO CP24
180.0000 mg | ORAL_CAPSULE | Freq: Every evening | ORAL | Status: DC
  Administered 2021-03-12 – 2021-03-15 (×4): 180 mg via ORAL
  Filled 2021-03-12 (×5): qty 1

## 2021-03-12 MED ORDER — LACTULOSE 10 GM/15ML PO SOLN
20.0000 g | Freq: Every day | ORAL | Status: DC
  Administered 2021-03-13 – 2021-03-16 (×4): 20 g via ORAL
  Filled 2021-03-12 (×5): qty 30

## 2021-03-12 NOTE — Progress Notes (Signed)
Occupational Therapy Treatment Patient Details Name: Barbara Frank MRN: [id number] DOB: [birth year] Today's Date: 03/12/2021    History of present illness Pt is a 75 y/o F admitted on 03/06/21 from [facility] for "feeling confused". Pt was found to be in a-fib with RVR. Head imaging negative for acute abnormality. Pt being treated for confusion likely due to obstipation. PMH: a-fib, depression, DM, renal CA   OT comments  Pt seen for OT tx this date. Pt received in bed, wakes easily to soft voice, agreeable to therapy and endorses needing assist for getting cleaned up. Pt notes her linens feel wet because she had to urinate. Pt agreeable to assist. Simple verbal cues and sharing the plan for movement before moving appeared to help pt with sequencing and although she endorsed fear of falling, she demonstrated less fear than in previous session with this OT. Pt required MOD A for rolling R and L with cues for hand placement on bed rail to assist. MAX A for hygiene to maximize skin protection. New purewick placed as well. RN notified. Pt able to instruct OT in bed positioning to optimize comfort and requested pain medicine for her R knee stating it feels "torn." RN notified. Pt demonstrates improved cognition this date supporting improved participation in ADL and bed mobility. Pt continues to benefit from skilled OT services to maximize safety and return to PLOF while minimizing caregiver burden. Continue to recommend short term rehab at [facility].    Follow Up Recommendations  SNF    Equipment Recommendations  3 in 1 bedside commode    Recommendations for Other Services      Precautions / Restrictions Precautions Precautions: Fall Restrictions Weight Bearing Restrictions: No       Mobility Bed Mobility Overal bed mobility: Needs Assistance Bed Mobility: Rolling Rolling: Mod assist         General bed mobility comments: MOD A for log roll for bed level toileting, cues for hand placement,  assist to maintain sidelying, fearful of movement, particularly when fast    Transfers                      Balance                                           ADL either performed or assessed with clinical judgement   ADL Overall ADL's : Needs assistance/impaired                           Toilet Transfer Details (indicate cue type and reason): bed level rolling requiring MOD A Toileting- Clothing Manipulation and Hygiene: Maximal assistance;Bed level               Vision Patient Visual Report: No change from baseline     Perception     Praxis      Cognition Arousal/Alertness: Awake/alert Behavior During Therapy: WFL for tasks assessed/performed Overall Cognitive Status: No family/caregiver present to determine baseline cognitive functioning                                 General Comments: pt alert and follows simple commands w/ repeated simple cues and time, oriented to self, becomes fearful with movement requiring encouragement        Exercises  Shoulder Instructions       General Comments      Pertinent Vitals/ Pain       Pain Assessment: Faces Faces Pain Scale: Hurts little more Pain Location: R knee Pain Descriptors / Indicators: Grimacing;Aching;Guarding Pain Intervention(s): Limited activity within patient's tolerance;Monitored during session  Home Living                                          Prior Functioning/Environment              Frequency  Min 1X/week        Progress Toward Goals  OT Goals(current goals can now be found in the care plan section)  Progress towards OT goals: Progressing toward goals  Acute Rehab OT Goals Patient Stated Goal: none stated OT Goal Formulation: Patient unable to participate in goal setting Time For Goal Achievement: 03/21/21 Potential to Achieve Goals: Good  Plan Discharge plan remains appropriate;Frequency remains  appropriate    Co-evaluation                 AM-PAC OT "6 Clicks" Daily Activity     Outcome Measure   Help from another person eating meals?: None Help from another person taking care of personal grooming?: A Little Help from another person toileting, which includes using toliet, bedpan, or urinal?: A Lot Help from another person bathing (including washing, rinsing, drying)?: A Lot Help from another person to put on and taking off regular upper body clothing?: A Lot Help from another person to put on and taking off regular lower body clothing?: A Lot 6 Click Score: 15    End of Session    OT Visit Diagnosis: Other abnormalities of gait and mobility (R26.89);History of falling (Z91.81);Muscle weakness (generalized) (M62.81);Other symptoms and signs involving cognitive function   Activity Tolerance Patient tolerated treatment well   Patient Left in bed;with call bell/phone within reach;with bed alarm set   Nurse Communication Mobility status        Time: 1340-1405 OT Time Calculation (min): 25 min  Charges: OT General Charges $OT Visit: 1 Visit OT Treatments $Self Care/Home Management : 23-37 mins  Hanley Hays, MPH, MS, OTR/L ascom [phone] 03/12/21, 2:23 PM

## 2021-03-12 NOTE — Progress Notes (Signed)
Inpatient Diabetes Program Recommendations  AACE/ADA: New Consensus Statement on Inpatient Glycemic Control (2015)  Target Ranges:  Prepandial:   less than 140 mg/dL      Peak postprandial:   less than 180 mg/dL (1-2 hours)      Critically ill patients:  140 - 180 mg/dL   Lab Results  Component Value Date   GLUCAP 245 (H) 03/12/2021   HGBA1C 8.8 (H) 03/07/2021    Review of Glycemic Control Results for Barbara Frank, Barbara Frank (MRN [id number]) as of 03/12/2021 11:04  Ref. Range 03/11/2021 07:37 03/11/2021 12:18 03/11/2021 16:34 03/11/2021 20:33 03/12/2021 07:29  Glucose-Capillary Latest Ref Range: 70 - 99 mg/dL 135 (H) 138 (H) 157 (H) 291 (H) 245 (H)   Diabetes history: DM 2 Outpatient Diabetes medications: Lantus 27 units bid, Metformin 1000 mg bid Current orders for Inpatient glycemic control:  Lantus 15 units bid Novolog 0-15 units tid + hs  A1c 8.8% Inpatient Diabetes Program Recommendations:    Note: basal insulin decreased due to PO intake, glucose trends in the mid to upper 200 range. Pt without hypoglycemia on home dose.  - consider increasing Lantus back up to 22 units bid  Thanks,  Tama Headings RN, MSN, BC-ADM Inpatient Diabetes Coordinator Team Pager [phone] (8a-5p)
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
## 2021-03-12 NOTE — Progress Notes (Signed)
Physical Therapy Treatment Patient Details Name: Barbara Frank MRN: [id number] DOB: [birth year] Today's Date: 03/12/2021    History of Present Illness Pt is a 75 y/o F admitted on 03/06/21 from [facility] for "feeling confused". Pt was found to be in a-fib with RVR. Head imaging negative for acute abnormality. Pt being treated for confusion likely due to obstipation. PMH: a-fib, depression, DM, renal CA    PT Comments    Initially distant but does progress to being interactive and appropriate conversation as session progresses initiating conversation.    Participated in exercises as described below.  Much encouragement to assist.  Agrees to EOB.  She does attempt to move BLE to EOB with small movements.  Attempts to assist to EOB but does need heavy mod a x 1 to fully get upright.  She sits x 20 minutes today.  Initially needs min a x 1 at all times to prevent post LOB but with time she is able to progress to min guard/supervision.  Body and movements remain stiff and slow but she is showing daily progress.  Discussed discharge planning and expectations of rehab.  Voices understanding.  Appropriate in discussion of where she is [facility]), life at [facility] and her work as a Engineer, agricultural.    Follow Up Recommendations  SNF     Equipment Recommendations       Recommendations for Other Services       Precautions / Restrictions Precautions Precautions: Fall Restrictions Weight Bearing Restrictions: No    Mobility  Bed Mobility Overal bed mobility: Needs Assistance Bed Mobility: Supine to Sit;Sit to Supine Rolling: Mod assist   Supine to sit: Max assist     General bed mobility comments: assists today with less fear noted    Transfers                 General transfer comment: unsafe to attempt with +1 assist.  Ambulation/Gait                 Stairs             Wheelchair Mobility    Modified Rankin (Stroke Patients Only)        Balance Overall balance assessment: Needs assistance Sitting-balance support: Feet unsupported;Bilateral upper extremity supported Sitting balance-Leahy Scale: Poor Sitting balance - Comments: initially requires min a x 1 at all times but able to progress to min gaurd at end of session Postural control: Posterior lean                                  Cognition Arousal/Alertness: Awake/alert Behavior During Therapy: WFL for tasks assessed/performed Overall Cognitive Status: No family/caregiver present to determine baseline cognitive functioning                                 General Comments: more alert today and conversation appropriate.  more engaged as session progresses.      Exercises Other Exercises Other Exercises: supine AAROM x 10 BLE Other Exercises: sitting EOB x 20 mintues    General Comments        Pertinent Vitals/Pain Pain Assessment: No/denies pain    Home Living                      Prior Function  PT Goals (current goals can now be found in the care plan section) Progress towards PT goals: Progressing toward goals    Frequency    Min 2X/week      PT Plan Current plan remains appropriate    Co-evaluation              AM-PAC PT "6 Clicks" Mobility   Outcome Measure  Help needed turning from your back to your side while in a flat bed without using bedrails?: A Lot Help needed moving from lying on your back to sitting on the side of a flat bed without using bedrails?: A Lot Help needed moving to and from a bed to a chair (including a wheelchair)?: Total Help needed standing up from a chair using your arms (e.g., wheelchair or bedside chair)?: A Lot Help needed to walk in hospital room?: Total Help needed climbing 3-5 steps with a railing? : Total 6 Click Score: 9    End of Session   Activity Tolerance: Patient tolerated treatment well Patient left: in bed;with call bell/phone within  reach;with bed alarm set Nurse Communication: Mobility status PT Visit Diagnosis: Muscle weakness (generalized) (M62.81);Difficulty in walking, not elsewhere classified (R26.2);Unsteadiness on feet (R26.81)     Time: 6184-8592 PT Time Calculation (min) (ACUTE ONLY): 31 min  Charges:  $Therapeutic Exercise: 8-22 mins $Therapeutic Activity: 8-22 mins                    Chesley Noon, PTA 03/12/21, 9:52 AM , 9:48 AM

## 2021-03-12 NOTE — Progress Notes (Signed)
Patient alert and asking for water. Patient offered nectar thick water via spoon. Patient voiced frustration regarding the nectar thick liquid. Education provided regarding aspiration precaution and patient's safety. Patient also wants to get out of bed to go to the bathroom. Purewick is in place and draining properly and bed pan was offered. Education provided again regarding patient safety and the importance of physical/occupational therapy evaluation. Patient able to tell nurse the year, her name and birth date, and that she is at [facility]. Patient reoriented to situation and reason for hospitalization. Patient states she does not understand and does not believe she has had an altered level of consciousness for days. Education provided regarding patient condition and medications. Patient does not seem to have clear understanding of events and situation and may benefit from frequent reorientation. Will notify day team and continue to monitor.

## 2021-03-12 NOTE — Progress Notes (Signed)
Patient ID: Barbara Frank, female   DOB: 01-Aug-1946, 75 y.o.   MRN: [id number] [facility] PROGRESS NOTE  Jaelani Posa FAO:[id number] DOB: [birth year] DOA: 03/06/2021 PCP: Rob Bunting L  HPI/Subjective: I gave the patient a sternal rub and she woke up and talk to me answered a few questions but kept her eyes closed most in the visit.  She states she feels okay.  She ate a little bit today.  Admitted with altered mental status.  Patient does not have any pain in her left hip or knee today.  Objective: Vitals:   03/12/21 0725 03/12/21 1126  BP: 138/68 138/68  Pulse: 87 66  Resp: (!) 22 (!) 22  Temp: 97.7 F (36.5 C)   SpO2: 94% 98%    Intake/Output Summary (Last 24 hours) at 03/12/2021 1451 Last data filed at 03/12/2021 0400 Gross per 24 hour  Intake 1637.51 ml  Output 700 ml  Net 937.51 ml   Filed Weights   03/06/21 1623  Weight: 83.7 kg    ROS: Review of Systems  Respiratory:  Negative for shortness of breath.   Cardiovascular:  Negative for chest pain.  Gastrointestinal:  Negative for abdominal pain.  Exam: Physical Exam HENT:     Head: Normocephalic.     Mouth/Throat:     Pharynx: No oropharyngeal exudate.  Eyes:     Conjunctiva/sclera: Conjunctivae normal.  Cardiovascular:     Rate and Rhythm: Tachycardia present. Rhythm irregularly irregular.     Heart sounds: Normal heart sounds, S1 normal and S2 normal.  Pulmonary:     Breath sounds: Examination of the right-lower field reveals decreased breath sounds. Examination of the left-lower field reveals decreased breath sounds. Decreased breath sounds present. No wheezing, rhonchi or rales.  Abdominal:     Palpations: Abdomen is soft.     Tenderness: There is no abdominal tenderness.  Musculoskeletal:     Right lower leg: No swelling.     Left lower leg: No swelling.  Skin:    General: Skin is warm.     Findings: No rash.  Neurological:     Mental Status: She is lethargic.     Data Reviewed: Basic  Metabolic Panel: Recent Labs  Lab 03/07/21 0001 03/08/21 0944 03/09/21 0531 03/10/21 0607 03/11/21 0515 03/12/21 0549  NA  --  139 137 138 135 136  K  --  4.1 3.9 4.0 4.1 3.7  CL  --  104 102 104 102 102  CO2  --  26 26 29 24 28   GLUCOSE  --  235* 193* 148* 162* 275*  BUN  --  17 18 16 18 19   CREATININE  --  0.50 0.51 0.49 0.45 0.36*  CALCIUM  --  10.6* 10.4* 10.4* 10.3 10.1  MG 1.6* 1.8 1.6* 1.8 1.8  --     Recent Labs  Lab 03/06/21 1629 03/12/21 0549  AMMONIA 50* 26   CBC: Recent Labs  Lab 03/06/21 1629 03/08/21 0944 03/09/21 0531 03/10/21 0607 03/11/21 0515  WBC 5.7 11.4* 11.2* 10.1 9.9  NEUTROABS 3.7  --   --   --   --   HGB 12.9 13.9 13.8 13.1 13.1  HCT 39.3 42.9 41.7 40.8 41.4  MCV 82.7 83.5 82.9 82.8 86.6  PLT 213 245 262 255 237     CBG: Recent Labs  Lab 03/11/21 1218 03/11/21 1634 03/11/21 2033 03/12/21 0729 03/12/21 1129  GLUCAP 138* 157* 291* 245* 343*    Recent Results (from the past 240 hour(s))  Resp Panel by RT-PCR (Flu A&B, Covid) Nasopharyngeal Swab     Status: None   Collection Time: 03/06/21  4:56 PM   Specimen: Nasopharyngeal Swab; Nasopharyngeal(NP) swabs in vial transport medium  Result Value Ref Range Status   SARS Coronavirus 2 by RT PCR NEGATIVE NEGATIVE Final    Comment: (NOTE) SARS-CoV-2 target nucleic acids are NOT DETECTED.  The SARS-CoV-2 RNA is generally detectable in upper respiratory specimens during the acute phase of infection. The lowest concentration of SARS-CoV-2 viral copies this assay can detect is 138 copies/mL. A negative result does not preclude SARS-Cov-2 infection and should not be used as the sole basis for treatment or other patient management decisions. A negative result may occur with  improper specimen collection/handling, submission of specimen other than nasopharyngeal swab, presence of viral mutation(s) within the areas targeted by this assay, and inadequate number of viral copies(<138  copies/mL). A negative result must be combined with clinical observations, patient history, and epidemiological information. The expected result is Negative.  Fact Sheet for Patients:  EntrepreneurPulse.com.au  Fact Sheet for Healthcare Providers:  IncredibleEmployment.be  This test is no t yet approved or cleared by the [place] FDA and  has been authorized for detection and/or diagnosis of SARS-CoV-2 by FDA under an Emergency Use Authorization (EUA). This EUA will remain  in effect (meaning this test can be used) for the duration of the COVID-19 declaration under Section 564(b)(1) of the Act, 21 U.S.C.section 360bbb-3(b)(1), unless the authorization is terminated  or revoked sooner.       Influenza A by PCR NEGATIVE NEGATIVE Final   Influenza B by PCR NEGATIVE NEGATIVE Final    Comment: (NOTE) The Xpert Xpress SARS-CoV-2/FLU/RSV plus assay is intended as an aid in the diagnosis of influenza from Nasopharyngeal swab specimens and should not be used as a sole basis for treatment. Nasal washings and aspirates are unacceptable for Xpert Xpress SARS-CoV-2/FLU/RSV testing.  Fact Sheet for Patients: EntrepreneurPulse.com.au  Fact Sheet for Healthcare Providers: IncredibleEmployment.be  This test is not yet approved or cleared by the [place] FDA and has been authorized for detection and/or diagnosis of SARS-CoV-2 by FDA under an Emergency Use Authorization (EUA). This EUA will remain in effect (meaning this test can be used) for the duration of the COVID-19 declaration under Section 564(b)(1) of the Act, 21 U.S.C. section 360bbb-3(b)(1), unless the authorization is terminated or revoked.  Performed at [facility], [address] [place], [place] [address]   MRSA Next Gen by PCR, Nasal     Status: None   Collection Time: 03/07/21  6:56 PM   Specimen: Nasal Mucosa; Nasal Swab  Result  Value Ref Range Status   MRSA by PCR Next Gen NOT DETECTED NOT DETECTED Final    Comment: (NOTE) The GeneXpert MRSA Assay (FDA approved for NASAL specimens only), is one component of a comprehensive MRSA colonization surveillance program. It is not intended to diagnose MRSA infection nor to guide or monitor treatment for MRSA infections. Test performance is not FDA approved in patients less than 61 years old. Performed at [facility], [address], [place], [place] [address]   Urine Culture     Status: Abnormal   Collection Time: 03/08/21  2:01 PM   Specimen: Urine, Random  Result Value Ref Range Status   Specimen Description   Final    URINE, RANDOM Performed at [facility], [address] [place], [place] [address]    Special Requests   Final  NONE Performed at [facility], [address] [place], [place] [address]    Culture (A)  Final    >=100,000 COLONIES/mL VIRIDANS STREPTOCOCCUS Standardized susceptibility testing for this organism is not available. Performed at [facility], [place] [address], [place], [place] [address]    Report Status 03/10/2021 FINAL  Final  CULTURE, BLOOD (ROUTINE X 2) w Reflex to ID Panel     Status: None (Preliminary result)   Collection Time: 03/09/21 10:08 AM   Specimen: BLOOD  Result Value Ref Range Status   Specimen Description BLOOD BLOOD RIGHT HAND  Final   Special Requests   Final    BOTTLES DRAWN AEROBIC AND ANAEROBIC Blood Culture adequate volume   Culture   Final    NO GROWTH 3 DAYS Performed at [facility], [address] [place], [place] [address]    Report Status PENDING  Incomplete  CULTURE, BLOOD (ROUTINE X 2) w Reflex to ID Panel     Status: None (Preliminary result)   Collection Time: 03/09/21 10:16 AM   Specimen: BLOOD  Result Value Ref Range Status   Specimen Description BLOOD LEFT ANTECUBITAL  Final   Special Requests   Final    BOTTLES DRAWN AEROBIC AND ANAEROBIC  Blood Culture adequate volume   Culture   Final    NO GROWTH 3 DAYS Performed at [facility], [address] [place], [place] [address]    Report Status PENDING  Incomplete     Studies: DG Knee 1-2 Views Left  Result Date: 03/11/2021 CLINICAL DATA:  History of pain. EXAM: LEFT KNEE - 1-2 VIEW COMPARISON:  No recent. FINDINGS: Diffuse osteopenia. Diffuse tricompartment with chondrocalcinosis. No evidence of fracture or dislocation. Small knee joint effusion. IMPRESSION: 1. Diffuse osteopenia. Diffuse tricompartment degenerative change with chondrocalcinosis. No acute bony abnormality. 2.  Small knee joint effusion. Electronically Signed   By: Marcello Moores  Register   On: 03/11/2021 13:41   DG HIP UNILAT WITH PELVIS 2-3 VIEWS LEFT  Result Date: 03/11/2021 CLINICAL DATA:  History of pain. EXAM: DG HIP (WITH OR WITHOUT PELVIS) 2-3V LEFT COMPARISON:  No prior. FINDINGS: Surgical clips noted over the pelvis. Degenerative changes lumbar spine and both hips. No acute bony abnormality. No evidence of fracture or dislocation. IMPRESSION: Diffuse osteopenia and degenerative change. No acute bony or joint abnormality identified. No evidence of fracture or dislocation. Electronically Signed   By: Marcello Moores  Register   On: 03/11/2021 13:43    Scheduled Meds:   stroke: mapping our early stages of recovery book   Does not apply Once   apixaban  5 mg Oral BID   bisacodyl  10 mg Rectal Daily   insulin aspart  0-15 Units Subcutaneous TID WC   insulin aspart  0-5 Units Subcutaneous QHS   insulin glargine  24 Units Subcutaneous BID   lactulose  20 g Oral BID   metoprolol succinate  50 mg Oral Q breakfast   nystatin   Topical TID   sertraline  25 mg Oral Daily   Continuous Infusions:  sodium chloride 50 mL/hr at 03/12/21 0900    ceFAZolin (ANCEF) IV 1 g (03/12/21 1402)   diltiazem (CARDIZEM) infusion 7.5 mg/hr (03/12/21 0650)    Assessment/Plan:  Acute delirium.  Likely secondary to urinary tract  infection.  Less likely elevated ammonia level.  Patient on dysphagia diet and likely will need full feeding.  MRI of the brain without contrast was negative. UTI with with strep viridans on IV  cefazolin Atrial fibrillation with rapid ventricular response on Cardizem drip.  Patient on Eliquis for anticoagulation.  On Toprol-XL in the morning.  We will add Cardizem CD in the evening and hopefully taper off the Cardizem drip. Type 2 diabetes mellitus.  Need to increase glargine insulin secondary to elevated sugars once I decrease the dose of the glargine yesterday. Dysphagia diet Elevated ammonia level has come back down.  Decrease dose of lactulose. History of renal cell carcinoma.  Holding new medication that was recently started. No knee or hip pain today as per patient.  I did order diclofenac cream as needed. Weakness.  Physical therapy recommends rehab.        Code Status:     Code Status Orders  (From admission, onward)           Start     Ordered   03/06/21 2037  Full code  Continuous        03/06/21 2037           Code Status History     This patient has a current code status but no historical code status.      Advance Directive Documentation    Flowsheet Row Most Recent Value  Type of Advance Directive Living will, Healthcare Power of Attorney  Pre-existing out of facility DNR order (yellow form or pink MOST form) --  "MOST" Form in Place? --      Family Communication: Spoke with patient's niece on the phone Disposition Plan: Status is: Inpatient  Dispo: The patient is from: Assisted living              Anticipated d/c is to: Rehab              Patient currently still with acute metabolic encephalopathy and not back to baseline   Difficult to place patient.  No.  Time spent: 27 minutes  [place]

## 2021-03-13 ENCOUNTER — Ambulatory Visit: Payer: Medicare PPO | Admitting: Oncology

## 2021-03-13 ENCOUNTER — Other Ambulatory Visit (HOSPITAL_COMMUNITY): Payer: Self-pay

## 2021-03-13 ENCOUNTER — Other Ambulatory Visit: Payer: Medicare PPO

## 2021-03-13 ENCOUNTER — Ambulatory Visit: Payer: Medicare PPO

## 2021-03-13 DIAGNOSIS — I482 Chronic atrial fibrillation, unspecified: Secondary | ICD-10-CM

## 2021-03-13 LAB — GLUCOSE, CAPILLARY
Glucose-Capillary: 167 mg/dL — ABNORMAL HIGH (ref 70–99)
Glucose-Capillary: 295 mg/dL — ABNORMAL HIGH (ref 70–99)
Glucose-Capillary: 181 mg/dL — ABNORMAL HIGH (ref 70–99)
Glucose-Capillary: 179 mg/dL — ABNORMAL HIGH (ref 70–99)

## 2021-03-13 MED ORDER — INSULIN GLARGINE 100 UNIT/ML ~~LOC~~ SOLN
27.0000 [IU] | Freq: Two times a day (BID) | SUBCUTANEOUS | Status: DC
  Administered 2021-03-13 – 2021-03-16 (×5): 27 [IU] via SUBCUTANEOUS
  Filled 2021-03-13 (×7): qty 0.27

## 2021-03-13 MED ORDER — GLUCERNA SHAKE PO LIQD
237.0000 mL | Freq: Three times a day (TID) | ORAL | Status: DC
  Administered 2021-03-13 – 2021-03-16 (×6): 237 mL via ORAL

## 2021-03-13 MED ORDER — SODIUM CHLORIDE 0.9 % IV SOLN
INTRAVENOUS | Status: DC | PRN
  Administered 2021-03-13 – 2021-03-14 (×2): 50 mL via INTRAVENOUS

## 2021-03-13 NOTE — NC FL2 (Signed)
[place] LEVEL OF CARE SCREENING TOOL     IDENTIFICATION  Patient Name: Barbara Frank Birthdate: [birth year] Sex: female Admission Date (Current Location): 03/06/2021  [facility] and [place] Number:  Engineering geologist and Address:  [facility], [address], [place], [place] [address]      Provider Number: [id number]  Attending Physician Name and Address:  Loletha Grayer, MD  Relative Name and Phone Number:       Current Level of Care: Hospital Recommended Level of Care: [place] Prior Approval Number:    Date Approved/Denied:   PASRR Number: [id number]  Discharge Plan: SNF    Current Diagnoses: Patient Active Problem List   Diagnosis Date Noted   Weakness    Palliative care encounter    Acute cystitis with hematuria    Type 2 diabetes mellitus without complication, with long-term current use of insulin (HCC)    Increased ammonia level    Encephalopathy 03/07/2021   Altered mental status 03/06/2021   Intertrigo 03/06/2021   Encounter for antineoplastic chemotherapy 01/08/2021   Encounter for antineoplastic immunotherapy 01/08/2021   Goals of care, counseling/discussion 10/17/2020   Hypercalcemia 10/17/2020   Chest mass 10/17/2020   Atrial fibrillation with RVR ([place]) 09/26/2020   Depression 09/26/2020   Diabetes type 2, uncontrolled ([place]) 09/26/2020   Hypokalemia 09/26/2020   Renal cell carcinoma ([place]) 09/26/2020   Acute delirium 09/26/2020   Ureteral stone 09/26/2020    Orientation RESPIRATION BLADDER Height & Weight     Self  Normal Incontinent, External catheter Weight: 184 lb 8 oz (83.7 kg) Height:  5\' 2"  (157.5 cm)  BEHAVIORAL SYMPTOMS/MOOD NEUROLOGICAL BOWEL NUTRITION STATUS   (None)  (None) Continent Diet (Regular diet. Please Cut tougher Meats and add Gravies; and do Not seen Salads currently.)  AMBULATORY STATUS COMMUNICATION OF NEEDS Skin   Total Care Verbally Bruising, Other (Comment)  (Blister, Contact dermatitis.)                       Personal Care Assistance Level of Assistance  Bathing, Feeding, Dressing Bathing Assistance: Maximum assistance Feeding assistance: Maximum assistance Dressing Assistance: Maximum assistance     Functional Limitations Info  Sight, Hearing, Speech Sight Info: Adequate Hearing Info: Adequate Speech Info: Adequate    SPECIAL CARE FACTORS FREQUENCY  PT (By licensed PT), OT (By licensed OT)     PT Frequency: 5 x week OT Frequency: 5 x week            Contractures Contractures Info: Not present    Additional Factors Info  Code Status, Allergies Code Status Info: Full code Allergies Info: Kiwi extract           Current Medications (03/13/2021):  This is the current hospital active medication list Current Facility-Administered Medications  Medication Dose Route Frequency Provider Last Rate Last Admin    stroke: mapping our early stages of recovery book   Does not apply Once Cox, Amy N, DO       acetaminophen (TYLENOL) tablet 650 mg  650 mg Oral Q4H PRN Cox, Amy N, DO   650 mg at 03/12/21 0623   Or   acetaminophen (TYLENOL) 160 MG/5ML solution 650 mg  650 mg Per Tube Q4H PRN Cox, Amy N, DO       Or   acetaminophen (TYLENOL) suppository 650 mg  650 mg Rectal Q4H PRN Cox, Amy N, DO       apixaban (ELIQUIS) tablet 5 mg  5 mg Oral BID Loletha Grayer, MD   5 mg at 03/13/21 0910   bisacodyl (DULCOLAX) suppository 10 mg  10 mg Rectal Daily Wyvonnia Dusky, MD   10 mg at 03/11/21 0942   ceFAZolin (ANCEF) IVPB 1 g/50 mL premix  1 g Intravenous Q8H Oswald Hillock, RPH 100 mL/hr at 03/13/21 0639 1 g at 03/13/21 5176   diclofenac Sodium (VOLTAREN) 1 % topical gel 4 g  4 g Topical QID PRN Loletha Grayer, MD   4 g at 03/12/21 1737   diltiazem (CARDIZEM CD) 24 hr capsule 180 mg  180 mg Oral QPM Loletha Grayer, MD   180 mg at 03/12/21 1737   diltiazem (CARDIZEM) 125 mg in dextrose 5% 125 mL (1 mg/mL) infusion  5-15 mg/hr  Intravenous Titrated Wyvonnia Dusky, MD   Stopped at 03/13/21 1045   feeding supplement (GLUCERNA SHAKE) (GLUCERNA SHAKE) liquid 237 mL  237 mL Oral TID BM Wieting, Richard, MD       haloperidol lactate (HALDOL) injection 5 mg  5 mg Intramuscular Q6H PRN Wyvonnia Dusky, MD   5 mg at 03/09/21 2217   insulin aspart (novoLOG) injection 0-15 Units  0-15 Units Subcutaneous TID WC Cox, Amy N, DO   5 Units at 03/13/21 1226   insulin aspart (novoLOG) injection 0-5 Units  0-5 Units Subcutaneous QHS Cox, Amy N, DO   2 Units at 03/12/21 2252   insulin glargine (LANTUS) injection 24 Units  24 Units Subcutaneous BID Loletha Grayer, MD   24 Units at 03/13/21 0911   lactulose (CHRONULAC) 10 GM/15ML solution 20 g  20 g Oral Daily Loletha Grayer, MD   20 g at 03/13/21 0911   metoprolol succinate (TOPROL-XL) 24 hr tablet 50 mg  50 mg Oral Q breakfast Cox, Amy N, DO   50 mg at 03/13/21 0912   metoprolol tartrate (LOPRESSOR) injection 5 mg  5 mg Intravenous Q8H PRN Wyvonnia Dusky, MD   5 mg at 03/10/21 0129   nystatin (MYCOSTATIN/NYSTOP) topical powder   Topical TID Cox, Amy N, DO   Given at 03/13/21 0912   senna-docusate (Senokot-S) tablet 1 tablet  1 tablet Oral QHS PRN Cox, Amy N, DO       sertraline (ZOLOFT) tablet 25 mg  25 mg Oral Daily Cox, Amy N, DO   25 mg at 03/13/21 0911     Discharge Medications: Please see discharge summary for a list of discharge medications.  Relevant Imaging Results:  Relevant Lab Results:   Additional Information SS#: [id number]. From [place].  Candie Chroman, LCSW

## 2021-03-13 NOTE — TOC Initial Note (Signed)
Transition of Care [facility]) - Initial/Assessment Note    Patient Details  Name: Barbara Frank MRN: [id number] Date of Birth: Jan 05, 1946  [facility] CM/SW Contact:    Candie Chroman, LCSW Phone Number: 03/13/2021, 1:27 PM  Clinical Narrative:   Received call back from niece. CSW introduced role and explained that therapy recommendations would be discussed. Patient's niece is agreeable to SNF but wants to also discuss with patient. She will come visit tomorrow. She gave CSW permission to go ahead and send out referral. No further concerns. CSW encouraged patient's niece to contact CSW as needed. CSW will continue to follow patient and her niece for support and facilitate discharge once medically stable.               Expected Discharge Plan: [place] Barriers to Discharge: Continued Medical Work up   Patient Goals and CMS Choice        Expected Discharge Plan and Services Expected Discharge Plan: [place] Acute Care Choice:  (TBD) Living arrangements for the past 2 months: [place]                                      Prior Living Arrangements/Services Living arrangements for the past 2 months: [place] Lives with:: Self Patient language and need for interpreter reviewed:: Yes        Need for Family Participation in Patient Care: Yes (Comment)     Criminal Activity/Legal Involvement Pertinent to Current Situation/Hospitalization: No - Comment as needed  Activities of Daily Living      Permission Sought/Granted Permission sought to share information with : Facility Sport and exercise psychologist, Family Supports    Share Information with NAME: [name]  Permission granted to share info w AGENCY: SNF's  Permission granted to share info w Relationship: Niece  Permission granted to share info w Contact Information: [phone]  Emotional Assessment Appearance:: Appears  stated age Attitude/Demeanor/Rapport: Unable to Assess Affect (typically observed): Unable to Assess Orientation: : Oriented to Self Alcohol / Substance Use: Not Applicable Psych Involvement: No (comment)  Admission diagnosis:  Shortness of breath [R06.02] Confusion [R41.0] Altered mental status [R41.82] Weakness [R53.1] Encephalopathy [G93.40] Patient Active Problem List   Diagnosis Date Noted   Weakness    Palliative care encounter    Acute cystitis with hematuria    Type 2 diabetes mellitus without complication, with long-term current use of insulin (HCC)    Increased ammonia level    Encephalopathy 03/07/2021   Altered mental status 03/06/2021   Intertrigo 03/06/2021   Encounter for antineoplastic chemotherapy 01/08/2021   Encounter for antineoplastic immunotherapy 01/08/2021   Goals of care, counseling/discussion 10/17/2020   Hypercalcemia 10/17/2020   Chest mass 10/17/2020   Atrial fibrillation with RVR ([place]) 09/26/2020   Depression 09/26/2020   Diabetes type 2, uncontrolled ([place]) 09/26/2020   Hypokalemia 09/26/2020   Renal cell carcinoma ([place]) 09/26/2020   Acute delirium 09/26/2020   Ureteral stone 09/26/2020   PCP:  Sheral Apley Pharmacy:   [place], [place] - [address] [place] [address] Phone: [phone] Fax: [phone]  [place] [address] Phone: [phone] Fax: [phone]     Social Determinants of Health (SDOH) Interventions    Readmission Risk Interventions No flowsheet data found.

## 2021-03-13 NOTE — Clinical Social Work Note (Signed)
Patient AOx1. Left voicemail for niece. Will discuss SNF recommendation when she calls back.  Dayton Scrape, [place]

## 2021-03-13 NOTE — Progress Notes (Signed)
Physical Therapy Treatment Patient Details Name: Barbara Frank MRN: [id number] DOB: [birth year] Today's Date: 03/13/2021    History of Present Illness Pt is a 75 y/o F admitted on 03/06/21 from [facility] for "feeling confused". Pt was found to be in a-fib with RVR. Head imaging negative for acute abnormality. Pt being treated for confusion likely due to obstipation. PMH: a-fib, depression, DM, renal CA    PT Comments    Pt tolerated treatment well today, with further participation limited secondary to agitation. Pt continues to require max assist for supine<>sit transfers, but required supervision for rolling. Fair static seated balance demonstrated at EOB for ~42min while performing BLE therex. Pt able to stand via squat pivot and total assist from PT, but declined attempting standing again despite max encouragement regarding benefits. Due to AMS, pt continues to require increased time and repetitive cues for attention to and sequencing of tasks. PT attempted to place bed in chair position, however, pt agitated and declining stating she wishes to remain in R sidelying position. Pt continues to be limited with meeting goals due to AMS, decreased activity tolerance, generalized weakness, poor safety awareness, and decreased gross balance. Pt will continue to benefit from skilled acute PT services to address deficits for return to baseline function. Will continue to recommend SNF at [place].    Follow Up Recommendations  SNF           Precautions / Restrictions Precautions Precautions: Fall Restrictions Weight Bearing Restrictions: No    Mobility  Bed Mobility Overal bed mobility: Needs Assistance Bed Mobility: Rolling Rolling: Supervision   Supine to sit: Max assist;HOB elevated Sit to supine: Max assist   General bed mobility comments: Supervision for safety to roll to the R at end of session with UE support. Max assist for trunk facilitation to sit EOB with max mulitmodal cues and HOB  elevated. Max assist for BLE facilitation onto bed to lie supine with HOB flat.    Transfers     Transfers: Sit to/from Stand Sit to Stand: Total assist         General transfer comment: Attempted to stand with RW, but unsuccessful. Total assist required for squat pivot to move up in bed; multimodal cues for sequencing.      Balance Overall balance assessment: Needs assistance Sitting-balance support: Feet unsupported;Bilateral upper extremity supported Sitting balance-Leahy Scale: Fair Sitting balance - Comments: Fair seated balance at EOB with BLE/BUE support.     Standing balance-Leahy Scale: Zero Standing balance comment: Able to maintain standing for ~5s with total assist from PT           Cognition Arousal/Alertness: Awake/alert Behavior During Therapy: [facility] for tasks assessed/performed;Agitated Overall Cognitive Status: No family/caregiver present to determine baseline cognitive functioning             General Comments: pt alert and follows simple commands w/ repeated simple cues and time, oriented to self and place with cues, becomes fearful with movement requiring encouragement. Increased agitation at end of session.      Exercises Other Exercises Other Exercises: Pt able to participate in bed mobility and transfers with max encouragement. Continues to require max-total assist and max multimodal cues for sequencing/safety. Other Exercises: Pt able to maintain static sitting EOB for ~19min while performing BLE therex including: ankle pumps, LAQ, and isometric hip ADD [id number]. Other Exercises: Pt educated regarding: PT role/POC, benefits of participation, and safety with mobility.    General Comments General comments (skin integrity, edema, etc.): Bruising noted  to LLE      Pertinent Vitals/Pain Pain Assessment: Faces Faces Pain Scale: Hurts little more Pain Location: R knee Pain Descriptors / Indicators: Grimacing;Aching;Guarding Pain Intervention(s):  Limited activity within patient's tolerance;Monitored during session;Repositioned     PT Goals (current goals can now be found in the care plan section) Acute Rehab PT Goals Patient Stated Goal: none stated PT Goal Formulation: Patient unable to participate in goal setting Time For Goal Achievement: 03/21/21 Potential to Achieve Goals: Fair Progress towards PT goals: Progressing toward goals    Frequency    Min 2X/week      PT Plan Current plan remains appropriate       AM-PAC PT "6 Clicks" Mobility   Outcome Measure  Help needed turning from your back to your side while in a flat bed without using bedrails?: A Lot Help needed moving from lying on your back to sitting on the side of a flat bed without using bedrails?: A Lot Help needed moving to and from a bed to a chair (including a wheelchair)?: Total Help needed standing up from a chair using your arms (e.g., wheelchair or bedside chair)?: A Lot Help needed to walk in hospital room?: Total Help needed climbing 3-5 steps with a railing? : Total 6 Click Score: 9    End of Session Equipment Utilized During Treatment: Gait belt Activity Tolerance: Treatment limited secondary to agitation Patient left: in bed;with call bell/phone within reach;with bed alarm set Nurse Communication: Mobility status PT Visit Diagnosis: Muscle weakness (generalized) (M62.81);Difficulty in walking, not elsewhere classified (R26.2);Unsteadiness on feet (R26.81)     Time: 0746-0029 PT Time Calculation (min) (ACUTE ONLY): 25 min  Charges:  $Therapeutic Exercise: 8-22 mins $Therapeutic Activity: 8-22 mins                      Herminio Commons, PT, DPT 11:01 AM,03/13/21

## 2021-03-13 NOTE — Progress Notes (Addendum)
Speech Language Pathology Treatment: Dysphagia  Patient Details Name: Barbara Frank MRN: [id number] DOB: 08/12/1946 Today's Date: 03/13/2021 Time: 0920-1010 SLP Time Calculation (min) (ACUTE ONLY): 50 min  Assessment / Plan / Recommendation Clinical Impression  Pt seen for ongoing assessment of swallowing. She is alert, verbally responsive and engaged in conversation w/ SLP when asked questions; circumlocution and delayed responses noted intermittently. Orientation to self and year. Much improved engagement overall but appears to have declined Cognitive status. She was unable to complete several Orientation questions, state where she lived prior to current residence, did not know her current residence, and did not know the [place]. She was oriented to her Niece's name and self. She appears to have decreased insight into her medical status as well. Pt is on RA; wbc wnl.  Pt explained general aspiration precautions and agreed verbally to the need for following them especially sitting upright for all oral intake -- supported behind the back more for full upright sitting. Pt assisted w/ positioning d/t min weakness. She fed self items of purees and soft solids and thin liquids via cup given setup support. NO overt clinical s/s of aspiration were noted w/ any consistency; respiratory status remained calm and unlabored, vocal quality clear b/t trials. Oral phase appeared [facility] for bolus management and timely A-P transfer for swallowing; oral clearing achieved w/ all consistencies. NSG denied any deficits in swallowing as well when given Pills Whole in Puree; the puree providing cohesion for swallowing tablets.   Pt appears at reduced risk for aspriation when following general aspiration precautions; support at meals. Recommend an upgrade to Regular/mech soft diet for ease of soft foods w/ gravies added to moisten foods; Thin liquids via Cup. Recommend general aspiration precautions; Pills Whole in Puree; tray  setup and positioning assistance for meals. ST services will monitor toleration of diet; NSG updated. Precautions posted at bedside.   Recommend f/u w/ Neurology Outpatient for a formal Cognitive evaluation/assessment to determine Baseline Cognitive status as there is reference to such in pt's chart notes; outpatient MD appt notes. MRI is negative for acute abnormalities. Pt could benefit from skilled f/u at SNF for Cognitive therapy post assessment and recommendations from Neurology. Need to better understand pt's Baseline Cognitive status per Neurology assessment to better establish GOC/POC. SW/MD updated.   ADDENDUM 03/13/2021:  noted pt exhibited agitation and confusion during PT session requiring cues for sequencing and max cues for encouragement w/ tasks.     HPI HPI: Pt is a 75 y.o. female with medical history significant for renal cell carcinoma on Keytruda and axitinib, metastatis, hypertension, hyperlipidemia, depression, insulin-dependent diabetes mellitus, atrial fibrillation, presents to the emergency department for chief concerns of confusion. Pt has chart history of multiple visits to [facility] for consultation and txs since 09/2020. Pt resides at ALF, [facility](for ~1 year now post move from home on the [place]). Per chart notes, Niece reported that "they have noticed a little bit of Confusion for pt and a little bit of a change in the last year but not diagnose Dementia".  CXR and MRI: no acute abnormalities.      SLP Plan  Continue with current plan of care       Recommendations  Diet recommendations: Regular;Dysphagia 3 (mechanical soft);Thin liquid (cut meats, no salads) Liquids provided via: Cup;No straw Medication Administration: Whole meds with puree (for safer swallowing) Supervision: Patient able to self feed;Staff to assist with self feeding;Intermittent supervision to cue for compensatory strategies Compensations: Minimize environmental distractions;Slow rate;Small  sips/bites;Lingual sweep for clearance of pocketing;Follow solids with liquid Postural Changes and/or Swallow Maneuvers: Out of bed for meals;Seated upright 90 degrees;Upright 30-60 min after meal                General recommendations:  (dietician f/u) Oral Care Recommendations: Oral care BID;Oral care before and after PO;Staff/trained caregiver to provide oral care Follow up Recommendations: Skilled Nursing facility SLP Visit Diagnosis: Dysphagia, unspecified (R13.10) (impact from declined Cognitive awareness/status) Plan: Continue with current plan of care       GO                 Orinda Kenner, [place], CCC-SLP Speech Language Pathologist Rehab Services [phone] [facility] 03/13/2021, 12:23 PM

## 2021-03-13 NOTE — Progress Notes (Signed)
Patient ID: Barbara Frank, female   DOB: 01-Mar-1946, 75 y.o.   MRN: [id number] [facility] PROGRESS NOTE  Barbara Frank KZS:[id number] DOB: 1946/01/25 DOA: 03/06/2021 PCP: Rob Bunting L  HPI/Subjective: Patient more alert today.  Seen while eating breakfast.  Able to answer more questions.  Confused on where she was but able to follow commands.  Diet was able to be advanced today.  Able to be titrated off Cardizem drip today.  Initially admitted with acute metabolic encephalopathy.  Objective: Vitals:   03/13/21 0734 03/13/21 1116  BP: 135/77 133/62  Pulse: 88 76  Resp: 16 16  Temp: (!) 97.5 F (36.4 C) 98.1 F (36.7 C)  SpO2: 97% 97%    Intake/Output Summary (Last 24 hours) at 03/13/2021 1406 Last data filed at 03/13/2021 0550 Gross per 24 hour  Intake --  Output 1360 ml  Net -1360 ml   Filed Weights   03/06/21 1623  Weight: 83.7 kg    ROS: Review of Systems  Respiratory:  Negative for shortness of breath.   Cardiovascular:  Negative for chest pain.  Gastrointestinal:  Negative for abdominal pain, nausea and vomiting.  Exam: Physical Exam HENT:     Head: Normocephalic.     Mouth/Throat:     Pharynx: No oropharyngeal exudate.  Eyes:     General: Lids are normal.     Conjunctiva/sclera: Conjunctivae normal.     Pupils: Pupils are equal, round, and reactive to light.  Cardiovascular:     Rate and Rhythm: Normal rate and regular rhythm.     Heart sounds: Normal heart sounds, S1 normal and S2 normal.  Pulmonary:     Breath sounds: Examination of the right-lower field reveals decreased breath sounds. Examination of the left-lower field reveals decreased breath sounds. Decreased breath sounds present. No wheezing, rhonchi or rales.  Abdominal:     Palpations: Abdomen is soft.     Tenderness: There is no abdominal tenderness.  Musculoskeletal:     Right lower leg: No swelling.     Left lower leg: No swelling.  Skin:    General: Skin is warm.     Findings:  No rash.  Neurological:     Mental Status: She is alert.     Data Reviewed: Basic Metabolic Panel: Recent Labs  Lab 03/07/21 0001 03/08/21 0944 03/09/21 0531 03/10/21 0607 03/11/21 0515 03/12/21 0549  NA  --  139 137 138 135 136  K  --  4.1 3.9 4.0 4.1 3.7  CL  --  104 102 104 102 102  CO2  --  26 26 29 24 28   GLUCOSE  --  235* 193* 148* 162* 275*  BUN  --  17 18 16 18 19   CREATININE  --  0.50 0.51 0.49 0.45 0.36*  CALCIUM  --  10.6* 10.4* 10.4* 10.3 10.1  MG 1.6* 1.8 1.6* 1.8 1.8  --     Recent Labs  Lab 03/06/21 1629 03/12/21 0549  AMMONIA 50* 26   CBC: Recent Labs  Lab 03/06/21 1629 03/08/21 0944 03/09/21 0531 03/10/21 0607 03/11/21 0515  WBC 5.7 11.4* 11.2* 10.1 9.9  NEUTROABS 3.7  --   --   --   --   HGB 12.9 13.9 13.8 13.1 13.1  HCT 39.3 42.9 41.7 40.8 41.4  MCV 82.7 83.5 82.9 82.8 86.6  PLT 213 245 262 255 237    CBG: Recent Labs  Lab 03/12/21 1129 03/12/21 1608 03/12/21 2105 03/13/21 0731 03/13/21 1120  GLUCAP 343* 312*  236* 167* 295*    Recent Results (from the past 240 hour(s))  Resp Panel by RT-PCR (Flu A&B, Covid) Nasopharyngeal Swab     Status: None   Collection Time: 03/06/21  4:56 PM   Specimen: Nasopharyngeal Swab; Nasopharyngeal(NP) swabs in vial transport medium  Result Value Ref Range Status   SARS Coronavirus 2 by RT PCR NEGATIVE NEGATIVE Final    Comment: (NOTE) SARS-CoV-2 target nucleic acids are NOT DETECTED.  The SARS-CoV-2 RNA is generally detectable in upper respiratory specimens during the acute phase of infection. The lowest concentration of SARS-CoV-2 viral copies this assay can detect is 138 copies/mL. A negative result does not preclude SARS-Cov-2 infection and should not be used as the sole basis for treatment or other patient management decisions. A negative result may occur with  improper specimen collection/handling, submission of specimen other than nasopharyngeal swab, presence of viral mutation(s) within  the areas targeted by this assay, and inadequate number of viral copies(<138 copies/mL). A negative result must be combined with clinical observations, patient history, and epidemiological information. The expected result is Negative.  Fact Sheet for Patients:  EntrepreneurPulse.com.au  Fact Sheet for Healthcare Providers:  IncredibleEmployment.be  This test is no t yet approved or cleared by the [place] FDA and  has been authorized for detection and/or diagnosis of SARS-CoV-2 by FDA under an Emergency Use Authorization (EUA). This EUA will remain  in effect (meaning this test can be used) for the duration of the COVID-19 declaration under Section 564(b)(1) of the Act, 21 U.S.C.section 360bbb-3(b)(1), unless the authorization is terminated  or revoked sooner.       Influenza A by PCR NEGATIVE NEGATIVE Final   Influenza B by PCR NEGATIVE NEGATIVE Final    Comment: (NOTE) The Xpert Xpress SARS-CoV-2/FLU/RSV plus assay is intended as an aid in the diagnosis of influenza from Nasopharyngeal swab specimens and should not be used as a sole basis for treatment. Nasal washings and aspirates are unacceptable for Xpert Xpress SARS-CoV-2/FLU/RSV testing.  Fact Sheet for Patients: EntrepreneurPulse.com.au  Fact Sheet for Healthcare Providers: IncredibleEmployment.be  This test is not yet approved or cleared by the [place] FDA and has been authorized for detection and/or diagnosis of SARS-CoV-2 by FDA under an Emergency Use Authorization (EUA). This EUA will remain in effect (meaning this test can be used) for the duration of the COVID-19 declaration under Section 564(b)(1) of the Act, 21 U.S.C. section 360bbb-3(b)(1), unless the authorization is terminated or revoked.  Performed at [facility], [address] [place], [place] [address]   MRSA Next Gen by PCR, Nasal     Status: None    Collection Time: 03/07/21  6:56 PM   Specimen: Nasal Mucosa; Nasal Swab  Result Value Ref Range Status   MRSA by PCR Next Gen NOT DETECTED NOT DETECTED Final    Comment: (NOTE) The GeneXpert MRSA Assay (FDA approved for NASAL specimens only), is one component of a comprehensive MRSA colonization surveillance program. It is not intended to diagnose MRSA infection nor to guide or monitor treatment for MRSA infections. Test performance is not FDA approved in patients less than 2 years old. Performed at [facility], [address], [place], [place] [address]   Urine Culture     Status: Abnormal   Collection Time: 03/08/21  2:01 PM   Specimen: Urine, Random  Result Value Ref Range Status   Specimen Description   Final    URINE, RANDOM Performed at [facility], [place]  [address] [place], [place] [address]    Special Requests   Final    NONE Performed at [facility], [address] [place], [place] [address]    Culture (A)  Final    >=100,000 COLONIES/mL VIRIDANS STREPTOCOCCUS Standardized susceptibility testing for this organism is not available. Performed at [facility], [place] [address], [place], [place] [address]    Report Status 03/10/2021 FINAL  Final  CULTURE, BLOOD (ROUTINE X 2) w Reflex to ID Panel     Status: None (Preliminary result)   Collection Time: 03/09/21 10:08 AM   Specimen: BLOOD  Result Value Ref Range Status   Specimen Description BLOOD BLOOD RIGHT HAND  Final   Special Requests   Final    BOTTLES DRAWN AEROBIC AND ANAEROBIC Blood Culture adequate volume   Culture   Final    NO GROWTH 4 DAYS Performed at [facility], [address] [place], [place] [address]    Report Status PENDING  Incomplete  CULTURE, BLOOD (ROUTINE X 2) w Reflex to ID Panel     Status: None (Preliminary result)   Collection Time: 03/09/21 10:16 AM   Specimen: BLOOD  Result Value Ref Range Status   Specimen Description BLOOD LEFT  ANTECUBITAL  Final   Special Requests   Final    BOTTLES DRAWN AEROBIC AND ANAEROBIC Blood Culture adequate volume   Culture   Final    NO GROWTH 4 DAYS Performed at [facility], [address] [place], [place] [address]    Report Status PENDING  Incomplete     Scheduled Meds:   stroke: mapping our early stages of recovery book   Does not apply Once   apixaban  5 mg Oral BID   bisacodyl  10 mg Rectal Daily   diltiazem  180 mg Oral QPM   feeding supplement (GLUCERNA SHAKE)  237 mL Oral TID BM   insulin aspart  0-15 Units Subcutaneous TID WC   insulin aspart  0-5 Units Subcutaneous QHS   insulin glargine  24 Units Subcutaneous BID   lactulose  20 g Oral Daily   metoprolol succinate  50 mg Oral Q breakfast   nystatin   Topical TID   sertraline  25 mg Oral Daily   Continuous Infusions:   ceFAZolin (ANCEF) IV 1 g (03/13/21 0639)   diltiazem (CARDIZEM) infusion Stopped (03/13/21 1045)    Assessment/Plan:  Acute delirium secondary to infection of the urinary tract.  Mri brain negative.  Patient more alert and interactive today than the previous 2 days.  Continue to watch progress. Acute cystitis with hematuria with strep viridans on IV cefazolin. Chronic atrial fibrillation with rapid ventricular response.  Was on Cardizem drip this morning able to titrate off Cardizem drip today.  Continue Eliquis for anticoagulation.  Continue Toprol-XL in the morning and Cardizem CD in the evening. Type 2 diabetes mellitus.  Need to increase glargine insulin secondary to elevated sugars.  Increased glargine insulin 27 units twice daily Elevated ammonia level has come back down.  Decrease dose of lactulose. History of renal cell carcinoma.  Holding the medication that was recently started.  Will have to follow-up with oncology as outpatient. Weakness.  Physical therapy recommends rehab. Diet advanced to regular diet     Code Status:     Code Status Orders  (From admission, onward)            Start     Ordered   03/06/21 2037  Full  code  Continuous        03/06/21 2037           Code Status History     This patient has a current code status but no historical code status.      Advance Directive Documentation    Flowsheet Row Most Recent Value  Type of Advance Directive Living will, Healthcare Power of Attorney  Pre-existing out of facility DNR order (yellow form or pink MOST form) --  "MOST" Form in Place? --      Family Communication: Disposition Plan: Status is: Inpatient  Dispo: The patient is from: Assisted living              Anticipated d/c is to: Rehab              Patient currently doing a little bit better with regards to her delirium.  Still very weak.  Tapered off Cardizem drip today.   Difficult to place patient.  No.  Time spent: 27 minutes  [place]

## 2021-03-14 LAB — CULTURE, BLOOD (ROUTINE X 2)
Culture: NO GROWTH
Culture: NO GROWTH
Special Requests: ADEQUATE
Special Requests: ADEQUATE

## 2021-03-14 LAB — CBC
HCT: 37.8 % (ref 36.0–46.0)
Hemoglobin: 11.9 g/dL — ABNORMAL LOW (ref 12.0–15.0)
MCH: 26.6 pg (ref 26.0–34.0)
MCHC: 31.5 g/dL (ref 30.0–36.0)
MCV: 84.4 fL (ref 80.0–100.0)
Platelets: 287 10*3/uL (ref 150–400)
RBC: 4.48 MIL/uL (ref 3.87–5.11)
RDW: 13.8 % (ref 11.5–15.5)
WBC: 7.3 10*3/uL (ref 4.0–10.5)
nRBC: 0 % (ref 0.0–0.2)

## 2021-03-14 LAB — GLUCOSE, CAPILLARY
Glucose-Capillary: 99 mg/dL (ref 70–99)
Glucose-Capillary: 321 mg/dL — ABNORMAL HIGH (ref 70–99)
Glucose-Capillary: 99 mg/dL (ref 70–99)
Glucose-Capillary: 98 mg/dL (ref 70–99)

## 2021-03-14 NOTE — Progress Notes (Addendum)
Speech Language Pathology Treatment: Dysphagia  Patient Details Name: Barbara Frank MRN: [id number] DOB: November 07, 1945 Today's Date: 03/14/2021 Time: 0910-0950 SLP Time Calculation (min) (ACUTE ONLY): 40 min  Assessment / Plan / Recommendation Clinical Impression  Pt seen this morning for ongoing assessment of swallowing; toleration of recently upgraded diet consistency(to regular/mech soft foods, thins). Pt is on RA; wbc wnl. She is awake/alert, verbally responsive and engaged in general responses w/ SLP when asked questions; circumlocution and delayed responses noted intermittently. Orientation to self and year only. Spontaneous remarks not pertaining to the meal tasks noted(ie., "when will the apartments be ready - it's really nice weather to see them"). Much improved Cogntiive engagement overall since admit, but pt appears to have declined Cognitive status. She was unable to complete several Orientation questions, state where she lived prior to current residence, did not know her current residence, and did not know the [place]. She was oriented to her Niece's name and self. She appears to have decreased insight into her medical status as well.    Pt explained general aspiration precautions and agreed verbally to the need for following them especially sitting upright for all oral intake -- Full positioning given by SLP for upright sitting in bed to eat the meal w/ MOD verbal/tactile cues. Tray setup given, then pt fed self items of soft solids, purees, and thin liquids via Cup. NO overt clinical s/s of aspiration were noted w/ any consistency; respiratory status remained calm and unlabored, vocal quality clear b/t trials. Oral phase appeared grossly [facility] for bolus management and timely A-P transfer for swallowing; oral clearing achieved w/ all consistencies. Min extra oral phase time taken w/ mastication of solids. No reports of swallowing deficits per NSG at recent meal.   Pt appears to exhibit  Moderately declined Cognitive awareness and insight in general tasks, though able to fully participate in known tasks such as feeding self at a meal when given Full setup and positioning support, verbal cues. Cognitive decline can increase risk for aspiration -- pt appears at reduced risk for aspriation when following general aspiration precautions, a min modified diet(foods), and when given support at meals. Recommend a  more Mech Soft diet for ease of soft foods CUT w/ gravies added to moisten foods; Thin liquids via Cup. Recommend general aspiration precautions; reduce Distractions at meals; Pills Whole in Puree for cohesion and safer swallowing; tray setup and positioning assistance for meals. Precautions posted at bedside.    Recommend f/u w/ Neurology Outpatient for a formal Cognitive evaluation/assessment to determine Baseline Cognitive status as there is reference to possible Cognitive decline/Dementia in pt's chart notes and Outpatient MD appt notes. MRI is negative for acute abnormalities this admit. Post f/u w/ Neurology for evaluation and dx, pt may benefit from skilled f/u at SNF for Cognitive therapy per recommendations from Neurology. Need to better understand pt's Baseline Cognitive status per a Neurology assessment in order to establish appropriate GOC/POC. SW/MD/NSG updated.       HPI HPI: Pt is a 75 y.o. female with medical history significant for renal cell carcinoma on Keytruda and axitinib, metastatis, hypertension, hyperlipidemia, depression, insulin-dependent diabetes mellitus, atrial fibrillation, presents to the emergency department for chief concerns of confusion. Pt has chart history of multiple visits to [facility] for consultation and txs since 09/2020. Pt resides at ALF, [facility](for ~1 year now post move from home on the [place]). Per chart notes, Niece reported that "they have noticed a little bit of Confusion for pt and a  little bit of a change in the last year but not  diagnose Dementia".  CXR and MRI: no acute abnormalities.      SLP Plan  All goals met       Recommendations  Diet recommendations: Dysphagia 3 (mechanical soft);Thin liquid Liquids provided via: Cup;No straw Medication Administration: Whole meds with puree (for safer swallowing) Supervision: Patient able to self feed;Intermittent supervision to cue for compensatory strategies (full tray setup) Compensations: Minimize environmental distractions;Slow rate;Small sips/bites;Lingual sweep for clearance of pocketing;Follow solids with liquid Postural Changes and/or Swallow Maneuvers: Out of bed for meals;Seated upright 90 degrees;Upright 30-60 min after meal                General recommendations:  (Dietician f/u for support) Oral Care Recommendations: Oral care BID;Oral care before and after PO;Staff/trained caregiver to provide oral care Follow up Recommendations: Skilled Nursing facility SLP Visit Diagnosis: Dysphagia, unspecified (R13.10) (suspect impacted by Cognitive decline) Plan: All goals met       [place], [place], CCC-SLP Speech Language Pathologist Rehab Services [phone] [facility] 03/14/2021, 10:28 AM

## 2021-03-14 NOTE — Plan of Care (Signed)

## 2021-03-14 NOTE — Progress Notes (Addendum)
Patient ID: Barbara Frank, female   DOB: 10/10/1945, 75 y.o.   MRN: [id number] [facility] PROGRESS NOTE  Barbara Frank ZHG:[id number] DOB: 05-25-1946 DOA: 03/06/2021 PCP: Rob Bunting L  HPI/Subjective: Patient was able to feed herself breakfast.  Patient more alert than a few days ago.  Slow with her answers.  Admitted with acute metabolic encephalopathy and treatment for UTI.  Objective: Vitals:   03/14/21 0753 03/14/21 1111  BP: 125/62 (!) 128/58  Pulse: 94 95  Resp: 16 16  Temp: 98.2 F (36.8 C) 98.2 F (36.8 C)  SpO2: 100% 93%    Intake/Output Summary (Last 24 hours) at 03/14/2021 1424 Last data filed at 03/14/2021 0959 Gross per 24 hour  Intake 1178 ml  Output 200 ml  Net 978 ml   Filed Weights   03/06/21 1623  Weight: 83.7 kg    ROS: Review of Systems  Respiratory:  Negative for shortness of breath.   Cardiovascular:  Negative for chest pain.  Gastrointestinal:  Negative for abdominal pain, nausea and vomiting.  Exam: Physical Exam HENT:     Head: Normocephalic.     Mouth/Throat:     Pharynx: No oropharyngeal exudate.  Eyes:     General: Lids are normal.     Conjunctiva/sclera: Conjunctivae normal.  Cardiovascular:     Rate and Rhythm: Normal rate and regular rhythm.     Heart sounds: Normal heart sounds, S1 normal and S2 normal.  Pulmonary:     Breath sounds: Normal breath sounds. No decreased breath sounds, wheezing, rhonchi or rales.  Abdominal:     Palpations: Abdomen is soft.     Tenderness: There is no abdominal tenderness.  Musculoskeletal:     Right lower leg: No swelling.     Left lower leg: No swelling.  Skin:    General: Skin is warm.     Findings: No rash.  Neurological:     Mental Status: She is alert.     Comments: Answers question and follows commands     Data Reviewed: Basic Metabolic Panel: Recent Labs  Lab 03/08/21 0944 03/09/21 0531 03/10/21 0607 03/11/21 0515 03/12/21 0549  NA 139 137 138 135 136  K 4.1 3.9  4.0 4.1 3.7  CL 104 102 104 102 102  CO2 26 26 29 24 28   GLUCOSE 235* 193* 148* 162* 275*  BUN 17 18 16 18 19   CREATININE 0.50 0.51 0.49 0.45 0.36*  CALCIUM 10.6* 10.4* 10.4* 10.3 10.1  MG 1.8 1.6* 1.8 1.8  --     Recent Labs  Lab 03/12/21 0549  AMMONIA 26   CBC: Recent Labs  Lab 03/08/21 0944 03/09/21 0531 03/10/21 0607 03/11/21 0515 03/14/21 0450  WBC 11.4* 11.2* 10.1 9.9 7.3  HGB 13.9 13.8 13.1 13.1 11.9*  HCT 42.9 41.7 40.8 41.4 37.8  MCV 83.5 82.9 82.8 86.6 84.4  PLT 245 262 255 237 287     CBG: Recent Labs  Lab 03/13/21 1120 03/13/21 1622 03/13/21 2119 03/14/21 0754 03/14/21 1112  GLUCAP 295* 181* 179* 99 321*    Recent Results (from the past 240 hour(s))  Resp Panel by RT-PCR (Flu A&B, Covid) Nasopharyngeal Swab     Status: None   Collection Time: 03/06/21  4:56 PM   Specimen: Nasopharyngeal Swab; Nasopharyngeal(NP) swabs in vial transport medium  Result Value Ref Range Status   SARS Coronavirus 2 by RT PCR NEGATIVE NEGATIVE Final    Comment: (NOTE) SARS-CoV-2 target nucleic acids are NOT DETECTED.  The SARS-CoV-2 RNA  is generally detectable in upper respiratory specimens during the acute phase of infection. The lowest concentration of SARS-CoV-2 viral copies this assay can detect is 138 copies/mL. A negative result does not preclude SARS-Cov-2 infection and should not be used as the sole basis for treatment or other patient management decisions. A negative result may occur with  improper specimen collection/handling, submission of specimen other than nasopharyngeal swab, presence of viral mutation(s) within the areas targeted by this assay, and inadequate number of viral copies(<138 copies/mL). A negative result must be combined with clinical observations, patient history, and epidemiological information. The expected result is Negative.  Fact Sheet for Patients:  EntrepreneurPulse.com.au  Fact Sheet for Healthcare Providers:   IncredibleEmployment.be  This test is no t yet approved or cleared by the [place] FDA and  has been authorized for detection and/or diagnosis of SARS-CoV-2 by FDA under an Emergency Use Authorization (EUA). This EUA will remain  in effect (meaning this test can be used) for the duration of the COVID-19 declaration under Section 564(b)(1) of the Act, 21 U.S.C.section 360bbb-3(b)(1), unless the authorization is terminated  or revoked sooner.       Influenza A by PCR NEGATIVE NEGATIVE Final   Influenza B by PCR NEGATIVE NEGATIVE Final    Comment: (NOTE) The Xpert Xpress SARS-CoV-2/FLU/RSV plus assay is intended as an aid in the diagnosis of influenza from Nasopharyngeal swab specimens and should not be used as a sole basis for treatment. Nasal washings and aspirates are unacceptable for Xpert Xpress SARS-CoV-2/FLU/RSV testing.  Fact Sheet for Patients: EntrepreneurPulse.com.au  Fact Sheet for Healthcare Providers: IncredibleEmployment.be  This test is not yet approved or cleared by the [place] FDA and has been authorized for detection and/or diagnosis of SARS-CoV-2 by FDA under an Emergency Use Authorization (EUA). This EUA will remain in effect (meaning this test can be used) for the duration of the COVID-19 declaration under Section 564(b)(1) of the Act, 21 U.S.C. section 360bbb-3(b)(1), unless the authorization is terminated or revoked.  Performed at [facility], [address] [place], [place] [address]   MRSA Next Gen by PCR, Nasal     Status: None   Collection Time: 03/07/21  6:56 PM   Specimen: Nasal Mucosa; Nasal Swab  Result Value Ref Range Status   MRSA by PCR Next Gen NOT DETECTED NOT DETECTED Final    Comment: (NOTE) The GeneXpert MRSA Assay (FDA approved for NASAL specimens only), is one component of a comprehensive MRSA colonization surveillance program. It is not intended to  diagnose MRSA infection nor to guide or monitor treatment for MRSA infections. Test performance is not FDA approved in patients less than 34 years old. Performed at [facility], [address] [place], [place] [address]   Urine Culture     Status: Abnormal   Collection Time: 03/08/21  2:01 PM   Specimen: Urine, Random  Result Value Ref Range Status   Specimen Description   Final    URINE, RANDOM Performed at [facility], [address], [place], [place] [address]    Special Requests   Final    NONE Performed at [facility] [address] [place],  [address]    Culture (A)  Final    >=100,000 COLONIES/mL VIRIDANS STREPTOCOCCUS Standardized susceptibility testing for this organism is not available. Performed at [facility], [place] [address], [place],  [address]    Report Status 03/10/2021 FINAL  Final  CULTURE, BLOOD (ROUTINE X 2) w  Reflex to ID Panel     Status: None   Collection Time: 03/09/21 10:08 AM   Specimen: BLOOD  Result Value Ref Range Status   Specimen Description BLOOD BLOOD RIGHT HAND  Final   Special Requests   Final    BOTTLES DRAWN AEROBIC AND ANAEROBIC Blood Culture adequate volume   Culture   Final    NO GROWTH 5 DAYS Performed at [facility], [address] [place], [place] [address]    Report Status 03/14/2021 FINAL  Final  CULTURE, BLOOD (ROUTINE X 2) w Reflex to ID Panel     Status: None   Collection Time: 03/09/21 10:16 AM   Specimen: BLOOD  Result Value Ref Range Status   Specimen Description BLOOD LEFT ANTECUBITAL  Final   Special Requests   Final    BOTTLES DRAWN AEROBIC AND ANAEROBIC Blood Culture adequate volume   Culture   Final    NO GROWTH 5 DAYS Performed at [facility], [address] [place], [place] [address]    Report Status 03/14/2021 FINAL  Final       Scheduled Meds:   stroke: mapping our early stages of recovery book   Does not apply Once    apixaban  5 mg Oral BID   bisacodyl  10 mg Rectal Daily   diltiazem  180 mg Oral QPM   feeding supplement (GLUCERNA SHAKE)  237 mL Oral TID BM   insulin aspart  0-15 Units Subcutaneous TID WC   insulin aspart  0-5 Units Subcutaneous QHS   insulin glargine  27 Units Subcutaneous BID   lactulose  20 g Oral Daily   metoprolol succinate  50 mg Oral Q breakfast   nystatin   Topical TID   sertraline  25 mg Oral Daily   Continuous Infusions:  sodium chloride Stopped (03/14/21 0035)    ceFAZolin (ANCEF) IV 1 g (03/14/21 0717)    Assessment/Plan:  Acute delirium secondary to acute cystitis with hematuria.  MRI of the brain negative.  Patient more alert today.  Patient was able to feed herself breakfast today.  Continue to watch progress. Acute cystitis with hematuria with strep viridans in the urine culture.  Continue IV cefazolin.  Blood cultures negative. Chronic atrial fibrillation with rapid ventricular response.  Was on Cardizem drip initially.  Patient on Toprol-XL and Cardizem CD for rate control.  Eliquis for anticoagulation.  We will get an echocardiogram. Type 2 diabetes mellitus.  Continue glargine insulin 27 units twice a day.  Continue short acting insulin. Elevated ammonia level on presentation has come back down.  Continue lactulose daily. History of renal cell carcinoma.  Holding medication that was recently started.  Will have to follow-up with oncology as outpatient. Weakness.  Physical therapy recommending rehab     Code Status:     Code Status Orders  (From admission, onward)           Start     Ordered   03/06/21 2037  Full code  Continuous        03/06/21 2037           Code Status History     This patient has a current code status but no historical code status.      Advance Directive Documentation    Flowsheet Row Most Recent Value  Type of Advance Directive Living will, Healthcare Power of Attorney  Pre-existing out of facility DNR order  (yellow form or pink MOST form) --  "MOST"  Form in Place? --      Family Communication: Spoke with niece on the phone Disposition Plan: Status is: Inpatient  Dispo: The patient is from: Home              Anticipated d/c is to: Rehab              Patient currently with acute delirium that is slowly clearing.   Difficult to place patient.  No.  Antibiotics: Ancef  Time spent: 26 minutes  Todd Jelinski [employer]

## 2021-03-15 ENCOUNTER — Inpatient Hospital Stay
Admit: 2021-03-15 | Discharge: 2021-03-15 | Disposition: A | Discharge status: Home / Self Care | Payer: Medicare PPO | Attending: Internal Medicine | Admitting: Internal Medicine

## 2021-03-15 LAB — ECHOCARDIOGRAM COMPLETE
AR max vel: 1.49 cm2
AV Peak grad: 5.5 mmHg
Ao pk vel: 1.17 m/s
Area-P 1/2: 7.16 cm2
Height: 62 in
S' Lateral: 2.28 cm
Weight: 2952 oz

## 2021-03-15 LAB — GLUCOSE, CAPILLARY
Glucose-Capillary: 138 mg/dL — ABNORMAL HIGH (ref 70–99)
Glucose-Capillary: 219 mg/dL — ABNORMAL HIGH (ref 70–99)
Glucose-Capillary: 152 mg/dL — ABNORMAL HIGH (ref 70–99)
Glucose-Capillary: 133 mg/dL — ABNORMAL HIGH (ref 70–99)

## 2021-03-15 MED ORDER — CEPHALEXIN 500 MG PO CAPS
500.0000 mg | ORAL_CAPSULE | Freq: Three times a day (TID) | ORAL | Status: DC
  Administered 2021-03-15 – 2021-03-16 (×3): 500 mg via ORAL
  Filled 2021-03-15 (×3): qty 1

## 2021-03-15 NOTE — Progress Notes (Signed)
Patient ID: Barbara Frank, female   DOB: 1946-03-14, 75 y.o.   MRN: [id number] [facility] PROGRESS NOTE  Barbara Frank EXN:[id number] DOB: 06-17-1946 DOA: 03/06/2021 PCP: Rob Bunting L  HPI/Subjective:   Objective: Vitals:   03/15/21 0808 03/15/21 1131  BP: (!) 112/49 120/79  Pulse: 91 86  Resp: 16 16  Temp: 97.9 F (36.6 C) 99.1 F (37.3 C)  SpO2: 95% 96%    Intake/Output Summary (Last 24 hours) at 03/15/2021 1354 Last data filed at 03/15/2021 1352 Gross per 24 hour  Intake 525.19 ml  Output --  Net 525.19 ml   Filed Weights   03/06/21 1623  Weight: 83.7 kg    ROS: Review of Systems  Respiratory:  Negative for shortness of breath.   Cardiovascular:  Negative for chest pain.  Gastrointestinal:  Negative for abdominal pain, nausea and vomiting.  Exam: Physical Exam HENT:     Head: Normocephalic.     Mouth/Throat:     Pharynx: No oropharyngeal exudate.  Eyes:     General: Lids are normal.     Conjunctiva/sclera: Conjunctivae normal.  Cardiovascular:     Rate and Rhythm: Normal rate and regular rhythm.     Heart sounds: S1 normal and S2 normal. Murmur heard.  Systolic murmur is present with a grade of 2/6.  Pulmonary:     Breath sounds: Normal breath sounds. No decreased breath sounds, wheezing, rhonchi or rales.  Abdominal:     Palpations: Abdomen is soft.     Tenderness: There is no abdominal tenderness.  Musculoskeletal:     Right ankle: No swelling.     Left ankle: No swelling.  Skin:    General: Skin is warm.     Findings: No rash.  Neurological:     Mental Status: She is alert.     Comments: Answers questions appropriately     Data Reviewed: Basic Metabolic Panel: Recent Labs  Lab 03/09/21 0531 03/10/21 0607 03/11/21 0515 03/12/21 0549  NA 137 138 135 136  K 3.9 4.0 4.1 3.7  CL 102 104 102 102  CO2 26 29 24 28   GLUCOSE 193* 148* 162* 275*  BUN 18 16 18 19   CREATININE 0.51 0.49 0.45 0.36*  CALCIUM 10.4* 10.4* 10.3 10.1  MG  1.6* 1.8 1.8  --    Recent Labs  Lab 03/12/21 0549  AMMONIA 26   CBC: Recent Labs  Lab 03/09/21 0531 03/10/21 0607 03/11/21 0515 03/14/21 0450  WBC 11.2* 10.1 9.9 7.3  HGB 13.8 13.1 13.1 11.9*  HCT 41.7 40.8 41.4 37.8  MCV 82.9 82.8 86.6 84.4  PLT 262 255 237 287    CBG: Recent Labs  Lab 03/14/21 1112 03/14/21 1626 03/14/21 2050 03/15/21 0810 03/15/21 1132  GLUCAP 321* 99 98 138* 219*    Recent Results (from the past 240 hour(s))  Resp Panel by RT-PCR (Flu A&B, Covid) Nasopharyngeal Swab     Status: None   Collection Time: 03/06/21  4:56 PM   Specimen: Nasopharyngeal Swab; Nasopharyngeal(NP) swabs in vial transport medium  Result Value Ref Range Status   SARS Coronavirus 2 by RT PCR NEGATIVE NEGATIVE Final    Comment: (NOTE) SARS-CoV-2 target nucleic acids are NOT DETECTED.  The SARS-CoV-2 RNA is generally detectable in upper respiratory specimens during the acute phase of infection. The lowest concentration of SARS-CoV-2 viral copies this assay can detect is 138 copies/mL. A negative result does not preclude SARS-Cov-2 infection and should not be used as the sole basis for treatment  or other patient management decisions. A negative result may occur with  improper specimen collection/handling, submission of specimen other than nasopharyngeal swab, presence of viral mutation(s) within the areas targeted by this assay, and inadequate number of viral copies(<138 copies/mL). A negative result must be combined with clinical observations, patient history, and epidemiological information. The expected result is Negative.  Fact Sheet for Patients:  EntrepreneurPulse.com.au  Fact Sheet for Healthcare Providers:  IncredibleEmployment.be  This test is no t yet approved or cleared by the [place] FDA and  has been authorized for detection and/or diagnosis of SARS-CoV-2 by FDA under an Emergency Use Authorization (EUA). This EUA  will remain  in effect (meaning this test can be used) for the duration of the COVID-19 declaration under Section 564(b)(1) of the Act, 21 U.S.C.section 360bbb-3(b)(1), unless the authorization is terminated  or revoked sooner.       Influenza A by PCR NEGATIVE NEGATIVE Final   Influenza B by PCR NEGATIVE NEGATIVE Final    Comment: (NOTE) The Xpert Xpress SARS-CoV-2/FLU/RSV plus assay is intended as an aid in the diagnosis of influenza from Nasopharyngeal swab specimens and should not be used as a sole basis for treatment. Nasal washings and aspirates are unacceptable for Xpert Xpress SARS-CoV-2/FLU/RSV testing.  Fact Sheet for Patients: EntrepreneurPulse.com.au  Fact Sheet for Healthcare Providers: IncredibleEmployment.be  This test is not yet approved or cleared by the [place] FDA and has been authorized for detection and/or diagnosis of SARS-CoV-2 by FDA under an Emergency Use Authorization (EUA). This EUA will remain in effect (meaning this test can be used) for the duration of the COVID-19 declaration under Section 564(b)(1) of the Act, 21 U.S.C. section 360bbb-3(b)(1), unless the authorization is terminated or revoked.  Performed at [facility], [address] [place], [place] [address]   MRSA Next Gen by PCR, Nasal     Status: None   Collection Time: 03/07/21  6:56 PM   Specimen: Nasal Mucosa; Nasal Swab  Result Value Ref Range Status   MRSA by PCR Next Gen NOT DETECTED NOT DETECTED Final    Comment: (NOTE) The GeneXpert MRSA Assay (FDA approved for NASAL specimens only), is one component of a comprehensive MRSA colonization surveillance program. It is not intended to diagnose MRSA infection nor to guide or monitor treatment for MRSA infections. Test performance is not FDA approved in patients less than 43 years old. Performed at [facility], [address], [place], [place] [address]   Urine  Culture     Status: Abnormal   Collection Time: 03/08/21  2:01 PM   Specimen: Urine, Random  Result Value Ref Range Status   Specimen Description   Final    URINE, RANDOM Performed at [facility], [address] [place], [place] [address]    Special Requests   Final    NONE Performed at [facility], [address] [place], [place] [address]    Culture (A)  Final    >=100,000 COLONIES/mL VIRIDANS STREPTOCOCCUS Standardized susceptibility testing for this organism is not available. Performed at [facility], [place] [address], [place], [place] [address]    Report Status 03/10/2021 FINAL  Final  CULTURE, BLOOD (ROUTINE X 2) w Reflex to ID Panel     Status: None   Collection Time: 03/09/21 10:08 AM   Specimen: BLOOD  Result Value Ref Range Status   Specimen Description BLOOD BLOOD RIGHT HAND  Final   Special Requests   Final  BOTTLES DRAWN AEROBIC AND ANAEROBIC Blood Culture adequate volume   Culture   Final    NO GROWTH 5 DAYS Performed at [facility], [address] [place], [place] [address]    Report Status 03/14/2021 FINAL  Final  CULTURE, BLOOD (ROUTINE X 2) w Reflex to ID Panel     Status: None   Collection Time: 03/09/21 10:16 AM   Specimen: BLOOD  Result Value Ref Range Status   Specimen Description BLOOD LEFT ANTECUBITAL  Final   Special Requests   Final    BOTTLES DRAWN AEROBIC AND ANAEROBIC Blood Culture adequate volume   Culture   Final    NO GROWTH 5 DAYS Performed at [facility], [address] [place], [place] [address]    Report Status 03/14/2021 FINAL  Final     Studies: ECHOCARDIOGRAM COMPLETE  Result Date: 03/15/2021    ECHOCARDIOGRAM REPORT   Patient Name:   Barbara Frank Date of Exam: 03/15/2021 Medical Rec #:  [id number]       Height:       62.0 in Accession #:    [id number]      Weight:       184.5 lb Date of Birth:  November 11, 1945       BSA:          1.847 m Patient Age:    29 years        BP:            120/66 mmHg Patient Gender: F               HR:           88 bpm. Exam Location:  [facility] Procedure: 2D Echo Indications:    htn  History:        Patient has no prior history of Echocardiogram examinations.                 CHF.  Sonographer:    Carl Best RN Referring Phys: [id number] [place]  1. Left ventricular ejection fraction, by estimation, is 60 to 65%. The left ventricle has normal function. The left ventricle has no regional wall motion abnormalities. Left ventricular diastolic parameters were normal.  2. Right ventricular systolic function is normal. The right ventricular size is normal.  3. Left atrial size was moderately dilated.  4. The mitral valve is normal in structure. Moderate mitral valve regurgitation.  5. The aortic valve is normal in structure. Aortic valve regurgitation is mild. FINDINGS  Left Ventricle: Left ventricular ejection fraction, by estimation, is 60 to 65%. The left ventricle has normal function. The left ventricle has no regional wall motion abnormalities. The left ventricular internal cavity size was normal in size. There is  no left ventricular hypertrophy. Left ventricular diastolic parameters were normal. Right Ventricle: The right ventricular size is normal. No increase in right ventricular wall thickness. Right ventricular systolic function is normal. Left Atrium: Left atrial size was moderately dilated. Right Atrium: Right atrial size was normal in size. Pericardium: There is no evidence of pericardial effusion. Mitral Valve: The mitral valve is normal in structure. Moderate mitral valve regurgitation. Tricuspid Valve: The tricuspid valve is normal in structure. Tricuspid valve regurgitation is mild. Aortic Valve: The aortic valve is normal in structure. Aortic valve regurgitation is mild. Aortic valve peak gradient measures 5.5 mmHg. Pulmonic Valve: The pulmonic valve was normal in structure. Pulmonic valve regurgitation is not visualized. Aorta: The aortic  root and ascending aorta are  structurally normal, with no evidence of dilitation. IAS/Shunts: No atrial level shunt detected by color flow Doppler.  LEFT VENTRICLE PLAX 2D LVIDd:         4.03 cm  Diastology LVIDs:         2.28 cm  LV e' medial:    6.64 cm/s LV PW:         1.33 cm  LV E/e' medial:  13.7 LV IVS:        1.25 cm  LV e' lateral:   8.05 cm/s LVOT diam:     1.80 cm  LV E/e' lateral: 11.3 LV SV:         30 LV SV Index:   16 LVOT Area:     2.54 cm  RIGHT VENTRICLE RV Basal diam:  2.75 cm TAPSE (M-mode): 1.7 cm LEFT ATRIUM           Index       RIGHT ATRIUM           Index LA diam:      4.90 cm 2.65 cm/m  RA Area:     14.60 cm LA Vol (A2C): 62.1 ml 33.62 ml/m RA Volume:   33.90 ml  18.35 ml/m LA Vol (A4C): 61.5 ml 33.29 ml/m  AORTIC VALVE                PULMONIC VALVE AV Area (Vmax): 1.49 cm    PV Vmax:       0.94 m/s AV Vmax:        117.00 cm/s PV Peak grad:  3.5 mmHg AV Peak Grad:   5.5 mmHg LVOT Vmax:      68.50 cm/s LVOT Vmean:     47.400 cm/s LVOT VTI:       0.117 m  AORTA Ao Root diam: 3.10 cm Ao Asc diam:  3.10 cm MITRAL VALVE               TRICUSPID VALVE MV Area (PHT): 7.16 cm    TV Peak grad:   31.2 mmHg MV Decel Time: 106 msec    TV Vmax:        2.79 m/s MV E velocity: 90.80 cm/s                            SHUNTS                            Systemic VTI:  0.12 m                            Systemic Diam: 1.80 cm Serafina Royals MD Electronically signed by Serafina Royals MD Signature Date/Time: 03/15/2021/12:36:20 PM    Final     Scheduled Meds:   stroke: mapping our early stages of recovery book   Does not apply Once   apixaban  5 mg Oral BID   bisacodyl  10 mg Rectal Daily   cephALEXin  500 mg Oral Q8H   diltiazem  180 mg Oral QPM   feeding supplement (GLUCERNA SHAKE)  237 mL Oral TID BM   insulin aspart  0-15 Units Subcutaneous TID WC   insulin aspart  0-5 Units Subcutaneous QHS   insulin glargine  27 Units Subcutaneous BID   lactulose  20 g Oral Daily   metoprolol succinate  50  mg Oral Q breakfast  nystatin   Topical TID   sertraline  25 mg Oral Daily   Continuous Infusions:  sodium chloride 10 mL/hr at 03/15/21 1352    Assessment/Plan:  Acute delirium secondary to acute cystitis with hematuria.  MRI of the brain negative.  Patient more alert today.  Patient able to have a better conversation with me and wants to be involved in her medical care.  Patient asking more questions.  Inquiring about her health. Acute cystitis with hematuria with strep viridans in the urine culture.  Had 5 days of IV cefazolin will switch over to p.o. Keflex today.  Blood cultures negative.  Echocardiogram showed moderate MR. Chronic atrial fibrillation with rapid ventricular response.  Continue Toprol-XL and Cardizem CD for rate control.  Eliquis for anticoagulation. Type 2 diabetes mellitus.  On glargine insulin 27 units twice a day and short acting insulin prior to meals Slightly elevated ammonia level on presentation which has come back down.  On lactulose daily History of renal cell carcinoma.  Holding new medication that was recently started we will have to follow-up with Dr. Tasia Catchings as outpatient. Weakness.  Physical therapy recommending rehab.        Code Status:     Code Status Orders  (From admission, onward)           Start     Ordered   03/06/21 2037  Full code  Continuous        03/06/21 2037           Code Status History     This patient has a current code status but no historical code status.      Advance Directive Documentation    Flowsheet Row Most Recent Value  Type of Advance Directive Living will, Healthcare Power of Attorney  Pre-existing out of facility DNR order (yellow form or pink MOST form) --  "MOST" Form in Place? --      Family Communication: Spoke with the patient's niece on the phone.  The patient states that she may change her POA to a friend Engineer, technical sales Disposition Plan: Status is: Inpatient  Dispo: The patient is from: Assisted  living              Anticipated d/c is to: Rehab              Patient currently doing better mental status wise.  If we have a rehab bed tomorrow potentially can get out to rehab.   Difficult to place patient.  No  Antibiotics: Keflex  Time spent: 27 minutes  [place]

## 2021-03-15 NOTE — TOC Progression Note (Signed)
Transition of Care [facility]) - Progression Note    Patient Details  Name: Barbara Frank MRN: [id number] Date of Birth: July 21, 1946  Transition of Care [facility]) CM/SW [place], LCSW Phone Number: 03/15/2021, 12:36 PM  Clinical Narrative:  Left voicemail for niece.   Expected Discharge Plan: [place] Barriers to Discharge: Continued Medical Work up  Expected Discharge Plan and Services Expected Discharge Plan: [place] Acute Care Choice:  (TBD) Living arrangements for the past 2 months: [place]                                       Social Determinants of Health (SDOH) Interventions    Readmission Risk Interventions No flowsheet data found.

## 2021-03-16 ENCOUNTER — Emergency Department: Payer: Medicare PPO

## 2021-03-16 ENCOUNTER — Other Ambulatory Visit: Payer: Self-pay

## 2021-03-16 ENCOUNTER — Emergency Department
Admission: EM | Admit: 2021-03-16 | Discharge: 2021-03-16 | Disposition: A | Discharge status: Home / Self Care | Payer: Medicare PPO | Attending: Emergency Medicine | Admitting: Emergency Medicine

## 2021-03-16 DIAGNOSIS — Z7901 Long term (current) use of anticoagulants: Secondary | ICD-10-CM | POA: Insufficient documentation

## 2021-03-16 DIAGNOSIS — Z79899 Other long term (current) drug therapy: Secondary | ICD-10-CM | POA: Insufficient documentation

## 2021-03-16 DIAGNOSIS — M79604 Pain in right leg: Secondary | ICD-10-CM | POA: Diagnosis not present

## 2021-03-16 DIAGNOSIS — I4891 Unspecified atrial fibrillation: Secondary | ICD-10-CM | POA: Insufficient documentation

## 2021-03-16 DIAGNOSIS — Z794 Long term (current) use of insulin: Secondary | ICD-10-CM | POA: Insufficient documentation

## 2021-03-16 DIAGNOSIS — E119 Type 2 diabetes mellitus without complications: Secondary | ICD-10-CM | POA: Insufficient documentation

## 2021-03-16 LAB — CBC WITH DIFFERENTIAL/PLATELET
Abs Immature Granulocytes: 0.09 10*3/uL — ABNORMAL HIGH (ref 0.00–0.07)
Basophils Absolute: 0 10*3/uL (ref 0.0–0.1)
Basophils Relative: 1 %
Eosinophils Absolute: 0.1 10*3/uL (ref 0.0–0.5)
Eosinophils Relative: 1 %
HCT: 39.8 % (ref 36.0–46.0)
Hemoglobin: 12.4 g/dL (ref 12.0–15.0)
Immature Granulocytes: 1 %
Lymphocytes Relative: 13 %
Lymphs Abs: 1.1 10*3/uL (ref 0.7–4.0)
MCH: 27 pg (ref 26.0–34.0)
MCHC: 31.2 g/dL (ref 30.0–36.0)
MCV: 86.7 fL (ref 80.0–100.0)
Monocytes Absolute: 0.7 10*3/uL (ref 0.1–1.0)
Monocytes Relative: 9 %
Neutro Abs: 6.1 10*3/uL (ref 1.7–7.7)
Neutrophils Relative %: 75 %
Platelets: 379 10*3/uL (ref 150–400)
RBC: 4.59 MIL/uL (ref 3.87–5.11)
RDW: 13.8 % (ref 11.5–15.5)
WBC: 8.2 10*3/uL (ref 4.0–10.5)
nRBC: 0 % (ref 0.0–0.2)

## 2021-03-16 LAB — COMPREHENSIVE METABOLIC PANEL
ALT: 11 U/L (ref 0–44)
AST: 19 U/L (ref 15–41)
Albumin: 2.7 g/dL — ABNORMAL LOW (ref 3.5–5.0)
Alkaline Phosphatase: 87 U/L (ref 38–126)
Anion gap: 6 (ref 5–15)
BUN: 11 mg/dL (ref 8–23)
CO2: 30 mmol/L (ref 22–32)
Calcium: 10.7 mg/dL — ABNORMAL HIGH (ref 8.9–10.3)
Chloride: 102 mmol/L (ref 98–111)
Creatinine, Ser: 0.5 mg/dL (ref 0.44–1.00)
GFR, Estimated: >60 mL/min (ref >60)
Glucose, Bld: 181 mg/dL — ABNORMAL HIGH (ref 70–99)
Potassium: 4.6 mmol/L (ref 3.5–5.1)
Sodium: 138 mmol/L (ref 135–145)
Total Bilirubin: 0.6 mg/dL (ref 0.3–1.2)
Total Protein: 6.9 g/dL (ref 6.5–8.1)

## 2021-03-16 LAB — GLUCOSE, CAPILLARY
Glucose-Capillary: 161 mg/dL — ABNORMAL HIGH (ref 70–99)
Glucose-Capillary: 200 mg/dL — ABNORMAL HIGH (ref 70–99)

## 2021-03-16 LAB — RESP PANEL BY RT-PCR (FLU A&B, COVID) ARPGX2
Influenza A by PCR: NEGATIVE
Influenza B by PCR: NEGATIVE
SARS Coronavirus 2 by RT PCR: NEGATIVE

## 2021-03-16 LAB — AMMONIA: Ammonia: <9 umol/L — ABNORMAL LOW (ref 9–35)

## 2021-03-16 MED ORDER — LACTULOSE 10 GM/15ML PO SOLN: 20.0000 g | Freq: Every day | ORAL | 0 refills | Status: DC

## 2021-03-16 MED ORDER — NYSTATIN 100000 UNIT/GM EX POWD
Freq: Three times a day (TID) | CUTANEOUS | 0 refills | Status: DC
Start: 2021-03-16 — End: 2021-07-31

## 2021-03-16 MED ORDER — DICLOFENAC SODIUM 1 % EX GEL: 4.0000 g | Freq: Four times a day (QID) | CUTANEOUS | 0 refills | Status: DC | PRN

## 2021-03-16 MED ORDER — CEPHALEXIN 500 MG PO CAPS: 500.0000 mg | ORAL_CAPSULE | Freq: Three times a day (TID) | ORAL | 0 refills | Status: AC

## 2021-03-16 MED ORDER — INSULIN ASPART 100 UNIT/ML IJ SOLN: 4.0000 [IU] | Freq: Three times a day (TID) | INTRAMUSCULAR | 0 refills | Status: DC

## 2021-03-16 MED ORDER — GLUCERNA SHAKE PO LIQD: 237.0000 mL | Freq: Three times a day (TID) | ORAL | 0 refills | Status: DC

## 2021-03-16 MED ORDER — DILTIAZEM HCL ER COATED BEADS 180 MG PO CP24: 180.0000 mg | ORAL_CAPSULE | Freq: Every evening | ORAL | 0 refills | Status: DC

## 2021-03-16 NOTE — Progress Notes (Addendum)
Physical Therapy Treatment Patient Details Name: Barbara Frank MRN: [id number] DOB: [birth year] Today's Date: 03/16/2021    History of Present Illness Barbara Frank is a 75 y/o F admitted on 03/06/21 from [facility] for "feeling confused". Pt was found to be in a-fib with RVR. Head imaging negative for acute abnormality. Pt being treated for confusion likely due to obstipation. PMH: a-fib, depression, DM, renal CA    PT Comments    Pt found at EOB, had gotten up d/t bowel urgency, despite bed alarm armament, feet were resting on floor. Pt agreeable to session, motivated to address this incontinence situation, poor awareness of deficits as she asks to walk to bathroom, but has been unable to stand for days, is unable to stand fully today, requires modA for attempt. Pt unable to take any steps for lateral transfer or pivot. Assisted back to supine. NA arrives to assist with linen change, cleaning of saddle integument. Strength improving overall, but mentation/AMS continues to present significant safety barriers to advancing upright activity.   Follow Up Recommendations  SNF     Equipment Recommendations       Recommendations for Other Services       Precautions / Restrictions Precautions Precautions: Fall Restrictions Weight Bearing Restrictions: No    Mobility  Bed Mobility Overal bed mobility: Needs Assistance Bed Mobility: Sit to Supine;Supine to Sit     Supine to sit: Supervision Sit to supine: Mod assist   General bed mobility comments: pt had already gotten herself up to EOB unsupervised upon entry, bed alarm armed    Transfers Overall transfer level: Needs assistance Equipment used: 1 person hand held assist Transfers: Sit to/from Stand           General transfer comment: 3x from elevated EOB, >50% rise without full stance, unable to side step toward center of bed.  Ambulation/Gait Ambulation/Gait assistance:  (unable due to weakness, despite motivation)                Marine scientist [place] (Stroke Patients Only)       Balance                                            Cognition Arousal/Alertness: Awake/alert Behavior During Therapy: Impulsive;Flat affect                                   General Comments: impulsive and confused, difficult to reorient.      Exercises      General Comments        Pertinent Vitals/Pain Pain Assessment: No/denies pain    Home Living                      Prior Function            PT Goals (current goals can now be found in the care plan section) Acute Rehab PT Goals Patient Stated Goal: none stated PT Goal Formulation: Patient unable to participate in goal setting Time For Goal Achievement: 03/21/21 Potential to Achieve Goals: Fair Progress towards PT goals: Progressing toward goals    Frequency    Min 2X/week      PT Plan Current plan remains appropriate  Co-evaluation              AM-PAC PT "6 Clicks" Mobility   Outcome Measure  Help needed turning from your back to your side while in a flat bed without using bedrails?: A Lot Help needed moving from lying on your back to sitting on the side of a flat bed without using bedrails?: A Lot Help needed moving to and from a bed to a chair (including a wheelchair)?: Total Help needed standing up from a chair using your arms (e.g., wheelchair or bedside chair)?: Total Help needed to walk in hospital room?: Total Help needed climbing 3-5 steps with a railing? : Total 6 Click Score: 8    End of Session   Activity Tolerance: Patient tolerated treatment well;No increased pain Patient left: in bed;with call bell/phone within reach;with nursing/sitter in room Nurse Communication: Mobility status PT Visit Diagnosis: Muscle weakness (generalized) (M62.81);Difficulty in walking, not elsewhere classified (R26.2);Unsteadiness on feet  (R26.81)     Time: 0104-0459 PT Time Calculation (min) (ACUTE ONLY): 14 min  Charges:  $Therapeutic Exercise: 8-22 mins                    12:36 PM, 03/16/21 Etta Grandchild, PT, DPT Physical Therapist - [facility]  [phone] ([place])     Val Farnam C 03/16/2021, 12:32 PM

## 2021-03-16 NOTE — TOC Transition Note (Signed)
Transition of Care [facility]) - CM/SW Discharge Note   Patient Details  Name: Barbara Frank MRN: [id number] Date of Birth: [birth year]  [facility] CM/SW Contact:  Eileen Stanford, LCSW Phone Number: 03/16/2021, 1:22 PM   Clinical Narrative:   Clinical Social Worker facilitated patient discharge including contacting patient family and facility to confirm patient discharge plans.  Clinical information faxed to facility and family agreeable with plan.  CSW arranged ambulance transport via [facility] to [facility] .  RN to call for report prior to discharge.     Final next level of care: Skilled Nursing Facility Barriers to Discharge: No Barriers Identified   Patient Goals and CMS Choice        Discharge Placement              Patient chooses bed at:  ([facility])     Patient and family notified of of transfer: 03/16/21  Discharge Plan and Services     Post Acute Care Choice:  (TBD)                               Social Determinants of Health (SDOH) Interventions     Readmission Risk Interventions No flowsheet data found.

## 2021-03-16 NOTE — TOC Progression Note (Signed)
Transition of Care [facility]) - Progression Note    Patient Details  Name: Barbara Frank MRN: [id number] Date of Birth: 1946-05-29  Transition of Care [facility]) CM/SW Contact  Eileen Stanford, LCSW Phone Number: 03/16/2021, 10:12 AM  Clinical Narrative:  Pt is agreeable to [facility]. Auth obtained. COVID test pending.     Expected Discharge Plan: [place] Barriers to Discharge: Continued Medical Work up  Expected Discharge Plan and Services Expected Discharge Plan: [place] Acute Care Choice:  (TBD) Living arrangements for the past 2 months: [place]                                       Social Determinants of Health (SDOH) Interventions    Readmission Risk Interventions No flowsheet data found.

## 2021-03-16 NOTE — ED Triage Notes (Signed)
Pt comes via EMS from [facility] with c/o left leg pain. Pt did have fall about 6 weeks ago.   VSS  Pt is at baseline of confusion.

## 2021-03-16 NOTE — ED Provider Notes (Signed)
[facility]-EMERGENCY DEPARTMENT  ____________________________________________  Time seen: Approximately 11:37 PM  I have reviewed the triage vital signs and the nursing notes.   HISTORY  Chief Complaint Leg Pain   Historian Patient     HPI Barbara Frank is a 75 y.o. female with a history of A. fib, depression, diabetes, and encephalopathy, presents to the emergency department with right leg pain.  Patient was asked to point to the leg but it been bothering her and she pointed to the right.  Triage note noted.  Patient states that she ambulates at home with a walker.  Patient states that she had a fall approximately 6 weeks ago.  She states that she has been able to bear some weight since injury occurred but is mostly restricted to her bed.  No fever or chills at home.  No other alleviating measures have been attempted.   Past Medical History:  Diagnosis Date   A-fib [facility])    Depression    Diabetes mellitus without complication ([place])    History of kidney stones    Kidney mass      Immunizations up to date:  Yes.     Past Medical History:  Diagnosis Date   A-fib ([place])    Depression    Diabetes mellitus without complication ([place])    History of kidney stones    Kidney mass     Patient Active Problem List   Diagnosis Date Noted   Atrial fibrillation, chronic ([place])    Weakness    Palliative care encounter    Acute cystitis with hematuria    Type 2 diabetes mellitus without complication, with long-term current use of insulin ([place])    Increased ammonia level    Encephalopathy 03/07/2021   Altered mental status 03/06/2021   Intertrigo 03/06/2021   Encounter for antineoplastic chemotherapy 01/08/2021   Encounter for antineoplastic immunotherapy 01/08/2021   Goals of care, counseling/discussion 10/17/2020   Hypercalcemia 10/17/2020   Chest mass 10/17/2020   Atrial fibrillation with RVR ([place]) 09/26/2020   Depression 09/26/2020   Diabetes type 2, uncontrolled ([place])  09/26/2020   Hypokalemia 09/26/2020   Renal cell carcinoma ([place]) 09/26/2020   Acute delirium 09/26/2020   Ureteral stone 09/26/2020    Past Surgical History:  Procedure Laterality Date   GASTRIC BYPASS     HERNIA REPAIR     TONSILLECTOMY     URETEROSCOPY WITH HOLMIUM LASER LITHOTRIPSY Left     Prior to Admission medications   Medication Sig Start Date End Date Taking? Authorizing Provider  acetaminophen (TYLENOL) 650 MG CR tablet Take 650 mg by mouth every 4 (four) hours as needed for pain or fever.    [provider]  axitinib (INLYTA) 5 MG tablet Take 1 tablet (5 mg total) by mouth 2 (two) times daily. 01/30/21   [name], MD  cephALEXin (KEFLEX) 500 MG capsule Take 1 capsule (500 mg total) by mouth every 8 (eight) hours for 4 days. 03/16/21 03/20/21  Loletha Grayer, MD  cetirizine (ZYRTEC) 10 MG tablet Take 10 mg by mouth daily.    [provider]  cyanocobalamin (,VITAMIN B-12,) 1000 MCG/ML injection Inject into the muscle. 09/24/20   [provider]  diclofenac Sodium (VOLTAREN) 1 % GEL Apply 4 g topically 4 (four) times daily as needed (pain). 03/16/21   Loletha Grayer, MD  diltiazem (CARDIZEM CD) 180 MG 24 hr capsule Take 1 capsule (180 mg total) by mouth every evening. 03/16/21   Loletha Grayer, MD  ELIQUIS 5 MG  TABS tablet Take 5 mg by mouth 2 (two) times daily. 08/18/20   [provider]  feeding supplement, GLUCERNA SHAKE, (GLUCERNA SHAKE) LIQD Take 237 mLs by mouth 3 (three) times daily between meals. 03/16/21   Loletha Grayer, MD  insulin aspart (NOVOLOG) 100 UNIT/ML injection Inject 4 Units into the skin 3 (three) times daily with meals. 03/16/21   Loletha Grayer, MD  insulin glargine (LANTUS) 100 UNIT/ML injection Inject 27 Units into the skin 2 (two) times daily. 27 u in the morning and 27 u at night    [provider]  lactulose (CHRONULAC) 10 GM/15ML solution Take 30 mLs (20 g total) by mouth daily. 03/17/21   Loletha Grayer, MD  metoprolol succinate (TOPROL-XL) 50 MG 24 hr tablet Take 50 mg by mouth daily. Take with or immediately following a meal.    [provider]  nystatin (MYCOSTATIN/NYSTOP) powder Apply topically 3 (three) times daily. 03/16/21   Loletha Grayer, MD  Psyllium 48.57 % POWD Take by mouth. Takes 1 QD prn    [provider]    Allergies Kiwi extract  Family History  Problem Relation Age of Onset   Breast cancer Mother    Bladder Cancer Neg Hx    Kidney cancer Neg Hx    Prostate cancer Neg Hx     Social History Social History   Tobacco Use   Smoking status: Never   Smokeless tobacco: Never  Vaping Use   Vaping Use: Never used  Substance Use Topics   Alcohol use: Not Currently   Drug use: Not Currently     Review of Systems  Constitutional: No fever/chills Eyes:  No discharge ENT: No upper respiratory complaints. Respiratory: no cough. No SOB/ use of accessory muscles to breath Gastrointestinal:   No nausea, no vomiting.  No diarrhea.  No constipation. Musculoskeletal: Patient has right leg pain.  Skin: Negative for rash, abrasions, lacerations, ecchymosis.    ____________________________________________   PHYSICAL EXAM:  VITAL SIGNS: ED Triage Vitals  Enc Vitals Group     BP 03/16/21 1738 136/74     Pulse Rate 03/16/21 1738 83     Resp 03/16/21 1738 17     Temp 03/16/21 1738 98.2 F (36.8 C)     Temp Source 03/16/21 1738 Oral     SpO2 03/16/21 1738 95 %     Weight --      Height --      Head Circumference --      Peak Flow --      Pain Score 03/16/21 1727 4     Pain Loc --      Pain Edu? --      Excl. in [place]? --      Constitutional: Alert. Well appearing and in no acute distress. Eyes: Conjunctivae are normal. PERRL. EOMI. Head: Atraumatic. ENT:      Nose: No congestion/rhinnorhea.      Mouth/Throat: Mucous membranes are moist.  Neck: No stridor.  No cervical spine tenderness to palpation. Cardiovascular: Normal rate,  regular rhythm. Normal S1 and S2.  Good peripheral circulation. Respiratory: Normal respiratory effort without tachypnea or retractions. Lungs CTAB. Good air entry to the bases with no decreased or absent breath sounds Gastrointestinal: Bowel sounds x 4 quadrants. Soft and nontender to palpation. No guarding or rigidity. No distention. Musculoskeletal: Full range of motion to all extremities. No obvious deformities noted Neurologic:  Normal for age. No gross focal neurologic deficits are appreciated.  Skin:  Skin is  warm, dry and intact. No rash noted. Psychiatric: Mood and affect are normal for age. Speech and behavior are normal.   ____________________________________________   LABS (all labs ordered are listed, but only abnormal results are displayed)  Labs Reviewed  CBC WITH DIFFERENTIAL/PLATELET - Abnormal; Notable for the following components:      Result Value   Abs Immature Granulocytes 0.09 (*)    All other components within normal limits  COMPREHENSIVE METABOLIC PANEL - Abnormal; Notable for the following components:   Glucose, Bld 181 (*)    Calcium 10.7 (*)    Albumin 2.7 (*)    All other components within normal limits  AMMONIA - Abnormal; Notable for the following components:   Ammonia <9 (*)    All other components within normal limits   ____________________________________________  EKG   ____________________________________________  RADIOLOGY Unk Pinto, personally viewed and evaluated these images (plain radiographs) as part of my medical decision making, as well as reviewing the written report by the radiologist.  US Venous Img Lower Unilateral Right  Result Date: 03/16/2021 CLINICAL DATA:  Right knee pain EXAM: RIGHT LOWER EXTREMITY VENOUS DOPPLER ULTRASOUND TECHNIQUE: Gray-scale sonography with graded compression, as well as color Doppler and duplex ultrasound were performed to evaluate the lower extremity deep venous systems from the level of the  common femoral vein and including the common femoral, femoral, profunda femoral, popliteal and calf veins including the posterior tibial, peroneal and gastrocnemius veins when visible. The superficial great saphenous vein was also interrogated. Spectral Doppler was utilized to evaluate flow at rest and with distal augmentation maneuvers in the common femoral, femoral and popliteal veins. COMPARISON:  None. FINDINGS: Contralateral Common Femoral Vein: Respiratory phasicity is normal and symmetric with the symptomatic side. No evidence of thrombus. Normal compressibility. Common Femoral Vein: No evidence of thrombus. Normal compressibility, respiratory phasicity and response to augmentation. Saphenofemoral Junction: No evidence of thrombus. Normal compressibility and flow on color Doppler imaging. Profunda Femoral Vein: No evidence of thrombus. Normal compressibility and flow on color Doppler imaging. Femoral Vein: No evidence of thrombus. Normal compressibility, respiratory phasicity and response to augmentation. Popliteal Vein: No evidence of thrombus. Normal compressibility, respiratory phasicity and response to augmentation. Calf Veins: No evidence of thrombus. Normal compressibility and flow on color Doppler imaging. Superficial Great Saphenous Vein: No evidence of thrombus. Normal compressibility. Venous Reflux:  None. Other Findings:  None. IMPRESSION: No evidence of deep venous thrombosis. Electronically Signed   By: Inez Catalina M.D.   On: 03/16/2021 22:58   DG Knee Complete 4 Views Right  Result Date: 03/16/2021 CLINICAL DATA:  Right knee pain following fall several weeks ago, initial encounter EXAM: RIGHT KNEE - COMPLETE 4+ VIEW COMPARISON:  None. FINDINGS: Tricompartmental degenerative changes are noted. No joint effusion is seen. No acute fracture or dislocation is noted. Calcifications of the menisci are seen. No soft tissue abnormality is noted. IMPRESSION: Degenerative change without acute  abnormality. Electronically Signed   By: Inez Catalina M.D.   On: 03/16/2021 22:21    ____________________________________________    PROCEDURES  Procedure(s) performed:     Procedures     Medications - No data to display   ____________________________________________   INITIAL IMPRESSION / ASSESSMENT AND PLAN / ED COURSE  Pertinent labs & imaging results that were available during my care of the patient were reviewed by me and considered in my medical decision making (see chart for details).       Assessment and Plan: Right knee pain  75 year old female presents to the emergency department with complaints of right leg pain after a fall 6 weeks ago.  Vital signs are reassuring at triage.  CBC and CMP was reassuring.  Ammonia level was within reference range.  No bony abnormalities were visualized on x-ray of the right knee venous ultrasound of the right lower extremity.  Recommended Tylenol for discomfort.  Return precautions were given to return with new or worsening symptoms.  ____________________________________________  FINAL CLINICAL IMPRESSION(S) / ED DIAGNOSES  Final diagnoses:  Right leg pain      NEW MEDICATIONS STARTED DURING THIS VISIT:  ED Discharge Orders     None           This chart was dictated using voice recognition software/Dragon. Despite best efforts to proofread, errors can occur which can change the meaning. Any change was purely unintentional.     Karren Cobble 03/16/21 [id number]    Carrie Mew, MD 03/16/21 [id number]

## 2021-03-16 NOTE — Care Management Important Message (Signed)
Important Message  Patient Details  Name: Barbara Frank MRN: [id number] Date of Birth: 1946-03-11   Medicare Important Message Given:  Yes     [name] 03/16/2021, 11:45 AM

## 2021-03-16 NOTE — Discharge Summary (Signed)
[facility] NAME: Seline Enzor    MR#:  [id number]  DATE OF BIRTH:  May 12, 1946  DATE OF ADMISSION:  03/06/2021 ADMITTING PHYSICIAN: Wyvonnia Dusky, MD  DATE OF DISCHARGE: 03/16/2021  PRIMARY CARE PHYSICIAN: Rob Bunting L    ADMISSION DIAGNOSIS:  Shortness of breath [R06.02] Confusion [R41.0] Altered mental status [R41.82] Weakness [R53.1] Encephalopathy [G93.40]  DISCHARGE DIAGNOSIS:  Principal Problem:   Altered mental status Active Problems:   Atrial fibrillation with RVR (HCC)   Depression   Diabetes type 2, uncontrolled (HCC)   Renal cell carcinoma (HCC)   Acute delirium   Intertrigo   Encephalopathy   Acute cystitis with hematuria   Type 2 diabetes mellitus without complication, with long-term current use of insulin (HCC)   Increased ammonia level   Weakness   Palliative care encounter   Atrial fibrillation, chronic ([place])   SECONDARY DIAGNOSIS:   Past Medical History:  Diagnosis Date  . A-fib ([place])   . Depression   . Diabetes mellitus without complication ([place])   . History of kidney stones   . Kidney mass     HOSPITAL COURSE:   Acute delirium secondary to acute cystitis with hematuria.  MRI of the brain negative.  Patient more alert the last 2 days.  Patient now asking questions about her health.  Acute delirium has cleared. Acute cystitis with hematuria secondary to strep viridans and urine culture.  Had 5 days of IV cefazolin and will switch over to p.o. Keflex for another 4 days upon discharge. Chronic atrial fibrillation with rapid ventricular response.  Patient was on Cardizem drip during the hospital course and now that she is able to take oral pills is on Toprol-XL and Cardizem CD for rate control.  Eliquis for anticoagulation. Type 2 diabetes mellitus.  Continue glargine insulin 27 units twice a day and short acting insulin prior to meals.  Hold Glucophage for right now. Slightly elevated  ammonia level on presentation which has come back down.  On lactulose daily. Renal cell carcinoma.  Case discussed with Dr. Tasia Catchings oncology and the medication that she is on does not cause altered mental status.  Will restart Inlyta.  Pharmacist to call her assisted living to send over to the rehab facility. Weakness.  Physical therapy recommends rehab.  DISCHARGE CONDITIONS:   Satisfactory  CONSULTS OBTAINED:    None  DRUG ALLERGIES:   Allergies  Allergen Reactions  . Kiwi Extract     DISCHARGE MEDICATIONS:   Allergies as of 03/16/2021       Reactions   Kiwi Extract         Medication List     STOP taking these medications    metFORMIN 1000 MG tablet Commonly known as: GLUCOPHAGE   sertraline 25 MG tablet Commonly known as: ZOLOFT       TAKE these medications    acetaminophen 650 MG CR tablet Commonly known as: TYLENOL Take 650 mg by mouth every 4 (four) hours as needed for pain or fever.   cephALEXin 500 MG capsule Commonly known as: KEFLEX Take 1 capsule (500 mg total) by mouth every 8 (eight) hours for 4 days.   cetirizine 10 MG tablet Commonly known as: ZYRTEC Take 10 mg by mouth daily.   cyanocobalamin 1000 MCG/ML injection Commonly known as: (VITAMIN B-12) Inject into the muscle.   diclofenac Sodium 1 % Gel Commonly known as: VOLTAREN Apply 4 g topically 4 (four) times daily as needed (  pain).   diltiazem 180 MG 24 hr capsule Commonly known as: CARDIZEM CD Take 1 capsule (180 mg total) by mouth every evening.   Eliquis 5 MG Tabs tablet Generic drug: apixaban Take 5 mg by mouth 2 (two) times daily.   feeding supplement (GLUCERNA SHAKE) Liqd Take 237 mLs by mouth 3 (three) times daily between meals.   Inlyta 5 MG tablet Generic drug: axitinib Take 1 tablet (5 mg total) by mouth 2 (two) times daily.   insulin aspart 100 UNIT/ML injection Commonly known as: novoLOG Inject 4 Units into the skin 3 (three) times daily with meals.   insulin  glargine 100 UNIT/ML injection Commonly known as: LANTUS Inject 27 Units into the skin 2 (two) times daily. 27 u in the morning and 27 u at night   lactulose 10 GM/15ML solution Commonly known as: CHRONULAC Take 30 mLs (20 g total) by mouth daily. Start taking on: March 17, 2021   metoprolol succinate 50 MG 24 hr tablet Commonly known as: TOPROL-XL Take 50 mg by mouth daily. Take with or immediately following a meal.   nystatin powder Commonly known as: MYCOSTATIN/NYSTOP Apply topically 3 (three) times daily.   Psyllium 48.57 % Powd Take by mouth. Takes 1 QD prn         DISCHARGE INSTRUCTIONS:   Follow-up team at rehab 1 day Follow-up oncology  If you experience worsening of your admission symptoms, develop shortness of breath, life threatening emergency, suicidal or homicidal thoughts you must seek medical attention immediately by calling 911 or calling your MD immediately  if symptoms less severe.  You Must read complete instructions/literature along with all the possible adverse reactions/side effects for all the Medicines you take and that have been prescribed to you. Take any new Medicines after you have completely understood and accept all the possible adverse reactions/side effects.   Please note  You were cared for by a hospitalist during your hospital stay. If you have any questions about your discharge medications or the care you received while you were in the hospital after you are discharged, you can call the unit and asked to speak with the hospitalist on call if the hospitalist that took care of you is not available. Once you are discharged, your primary care physician will handle any further medical issues. Please note that NO REFILLS for any discharge medications will be authorized once you are discharged, as it is imperative that you return to your primary care physician (or establish a relationship with a primary care physician if you do not have one) for your  aftercare needs so that they can reassess your need for medications and monitor your lab values.    Today   CHIEF COMPLAINT:   Chief Complaint  Patient presents with  . Weakness    HISTORY OF PRESENT ILLNESS:  Jomarie Gellis  is a 75 y.o. female came in with weakness and altered mental status   VITAL SIGNS:  Blood pressure 127/72, pulse 87, temperature 97.6 F (36.4 C), temperature source Oral, resp. rate 20, height 5\' 2"  (1.575 m), weight 83.7 kg, SpO2 100 %.  I/O:   Intake/Output Summary (Last 24 hours) at 03/16/2021 1029 Last data filed at 03/15/2021 2310 Gross per 24 hour  Intake 236.99 ml  Output 200 ml  Net 36.99 ml    PHYSICAL EXAMINATION:  GENERAL:  75 y.o.-year-old patient lying in the bed with no acute distress.  EYES: Pupils equal, round, reactive to light and accommodation. No scleral icterus.  HEENT: Head atraumatic, normocephalic. Oropharynx and nasopharynx clear.  LUNGS: Normal breath sounds bilaterally, no wheezing, rales,rhonchi or crepitation. No use of accessory muscles of respiration.  CARDIOVASCULAR: S1, S2 regular regular. No murmurs, rubs, or gallops.  ABDOMEN: Soft, non-tender, non-distended.  EXTREMITIES: No pedal edema.  NEUROLOGIC: Cranial nerves II through XII are intact. Muscle strength 5/5 in all extremities. Sensation intact. Gait not checked.  PSYCHIATRIC: The patient is alert and answering questions appropriately.  SKIN: No obvious rash, lesion, or ulcer.   DATA REVIEW:   CBC Recent Labs  Lab 03/14/21 0450  WBC 7.3  HGB 11.9*  HCT 37.8  PLT 287    Chemistries  Recent Labs  Lab 03/11/21 0515 03/12/21 0549  NA 135 136  K 4.1 3.7  CL 102 102  CO2 24 28  GLUCOSE 162* 275*  BUN 18 19  CREATININE 0.45 0.36*  CALCIUM 10.3 10.1  MG 1.8  --      Microbiology Results  Results for orders placed or performed during the hospital encounter of 03/06/21  Resp Panel by RT-PCR (Flu A&B, Covid) Nasopharyngeal Swab     Status: None    Collection Time: 03/06/21  4:56 PM   Specimen: Nasopharyngeal Swab; Nasopharyngeal(NP) swabs in vial transport medium  Result Value Ref Range Status   SARS Coronavirus 2 by RT PCR NEGATIVE NEGATIVE Final    Comment: (NOTE) SARS-CoV-2 target nucleic acids are NOT DETECTED.  The SARS-CoV-2 RNA is generally detectable in upper respiratory specimens during the acute phase of infection. The lowest concentration of SARS-CoV-2 viral copies this assay can detect is 138 copies/mL. A negative result does not preclude SARS-Cov-2 infection and should not be used as the sole basis for treatment or other patient management decisions. A negative result may occur with  improper specimen collection/handling, submission of specimen other than nasopharyngeal swab, presence of viral mutation(s) within the areas targeted by this assay, and inadequate number of viral copies(<138 copies/mL). A negative result must be combined with clinical observations, patient history, and epidemiological information. The expected result is Negative.  Fact Sheet for Patients:  EntrepreneurPulse.com.au  Fact Sheet for Healthcare Providers:  IncredibleEmployment.be  This test is no t yet approved or cleared by the [place] FDA and  has been authorized for detection and/or diagnosis of SARS-CoV-2 by FDA under an Emergency Use Authorization (EUA). This EUA will remain  in effect (meaning this test can be used) for the duration of the COVID-19 declaration under Section 564(b)(1) of the Act, 21 U.S.C.section 360bbb-3(b)(1), unless the authorization is terminated  or revoked sooner.       Influenza A by PCR NEGATIVE NEGATIVE Final   Influenza B by PCR NEGATIVE NEGATIVE Final    Comment: (NOTE) The Xpert Xpress SARS-CoV-2/FLU/RSV plus assay is intended as an aid in the diagnosis of influenza from Nasopharyngeal swab specimens and should not be used as a sole basis for treatment.  Nasal washings and aspirates are unacceptable for Xpert Xpress SARS-CoV-2/FLU/RSV testing.  Fact Sheet for Patients: EntrepreneurPulse.com.au  Fact Sheet for Healthcare Providers: IncredibleEmployment.be  This test is not yet approved or cleared by the [place] FDA and has been authorized for detection and/or diagnosis of SARS-CoV-2 by FDA under an Emergency Use Authorization (EUA). This EUA will remain in effect (meaning this test can be used) for the duration of the COVID-19 declaration under Section 564(b)(1) of the Act, 21 U.S.C. section 360bbb-3(b)(1), unless the authorization is terminated or revoked.  Performed at [facility], [place]  [address], [place], [place] [address]   MRSA Next Gen by PCR, Nasal     Status: None   Collection Time: 03/07/21  6:56 PM   Specimen: Nasal Mucosa; Nasal Swab  Result Value Ref Range Status   MRSA by PCR Next Gen NOT DETECTED NOT DETECTED Final    Comment: (NOTE) The GeneXpert MRSA Assay (FDA approved for NASAL specimens only), is one component of a comprehensive MRSA colonization surveillance program. It is not intended to diagnose MRSA infection nor to guide or monitor treatment for MRSA infections. Test performance is not FDA approved in patients less than 15 years old. Performed at [facility], [address], [place], [place] [address]   Urine Culture     Status: Abnormal   Collection Time: 03/08/21  2:01 PM   Specimen: Urine, Random  Result Value Ref Range Status   Specimen Description   Final    URINE, RANDOM Performed at [facility], [address] [place], [place] [address]    Special Requests   Final    NONE Performed at [facility], [address] [place], [place] [address]    Culture (A)  Final    >=100,000 COLONIES/mL VIRIDANS STREPTOCOCCUS Standardized susceptibility testing for this organism is not available. Performed at [facility], [place] [address] [place], [place] [address]    Report Status 03/10/2021 FINAL  Final  CULTURE, BLOOD (ROUTINE X 2) w Reflex to ID Panel     Status: None   Collection Time: 03/09/21 10:08 AM   Specimen: BLOOD  Result Value Ref Range Status   Specimen Description BLOOD BLOOD RIGHT HAND  Final   Special Requests   Final    BOTTLES DRAWN AEROBIC AND ANAEROBIC Blood Culture adequate volume   Culture   Final    NO GROWTH 5 DAYS Performed at [facility], [address] [place], [place] [address]    Report Status 03/14/2021 FINAL  Final  CULTURE, BLOOD (ROUTINE X 2) w Reflex to ID Panel     Status: None   Collection Time: 03/09/21 10:16 AM   Specimen: BLOOD  Result Value Ref Range Status   Specimen Description BLOOD LEFT ANTECUBITAL  Final   Special Requests   Final    BOTTLES DRAWN AEROBIC AND ANAEROBIC Blood Culture adequate volume   Culture   Final    NO GROWTH 5 DAYS Performed at [facility], [address], [place], [place] [address]    Report Status 03/14/2021 FINAL  Final    RADIOLOGY:  ECHOCARDIOGRAM COMPLETE  Result Date: 03/15/2021    ECHOCARDIOGRAM REPORT   Patient Name:   ASPYNN CLOVER Date of Exam: 03/15/2021 Medical Rec #:  [id number]       Height:       62.0 in Accession #:    [id number]      Weight:       184.5 lb Date of Birth:  09/16/1945       BSA:          1.847 m Patient Age:    75 years        BP:           120/66 mmHg Patient Gender: F               HR:           88 bpm. Exam Location:  [facility] Procedure: 2D Echo Indications:    htn  History:  Patient has no prior history of Echocardiogram examinations.                 CHF.  Sonographer:    Carl Best RN Referring Phys: [id number] [place]  1. Left ventricular ejection fraction, by estimation, is 60 to 65%. The left ventricle has normal function. The left ventricle has no regional wall motion abnormalities. Left ventricular diastolic parameters were normal.  2. Right  ventricular systolic function is normal. The right ventricular size is normal.  3. Left atrial size was moderately dilated.  4. The mitral valve is normal in structure. Moderate mitral valve regurgitation.  5. The aortic valve is normal in structure. Aortic valve regurgitation is mild. FINDINGS  Left Ventricle: Left ventricular ejection fraction, by estimation, is 60 to 65%. The left ventricle has normal function. The left ventricle has no regional wall motion abnormalities. The left ventricular internal cavity size was normal in size. There is  no left ventricular hypertrophy. Left ventricular diastolic parameters were normal. Right Ventricle: The right ventricular size is normal. No increase in right ventricular wall thickness. Right ventricular systolic function is normal. Left Atrium: Left atrial size was moderately dilated. Right Atrium: Right atrial size was normal in size. Pericardium: There is no evidence of pericardial effusion. Mitral Valve: The mitral valve is normal in structure. Moderate mitral valve regurgitation. Tricuspid Valve: The tricuspid valve is normal in structure. Tricuspid valve regurgitation is mild. Aortic Valve: The aortic valve is normal in structure. Aortic valve regurgitation is mild. Aortic valve peak gradient measures 5.5 mmHg. Pulmonic Valve: The pulmonic valve was normal in structure. Pulmonic valve regurgitation is not visualized. Aorta: The aortic root and ascending aorta are structurally normal, with no evidence of dilitation. IAS/Shunts: No atrial level shunt detected by color flow Doppler.  LEFT VENTRICLE PLAX 2D LVIDd:         4.03 cm  Diastology LVIDs:         2.28 cm  LV e' medial:    6.64 cm/s LV PW:         1.33 cm  LV E/e' medial:  13.7 LV IVS:        1.25 cm  LV e' lateral:   8.05 cm/s LVOT diam:     1.80 cm  LV E/e' lateral: 11.3 LV SV:         30 LV SV Index:   16 LVOT Area:     2.54 cm  RIGHT VENTRICLE RV Basal diam:  2.75 cm TAPSE (M-mode): 1.7 cm LEFT ATRIUM            Index       RIGHT ATRIUM           Index LA diam:      4.90 cm 2.65 cm/m  RA Area:     14.60 cm LA Vol (A2C): 62.1 ml 33.62 ml/m RA Volume:   33.90 ml  18.35 ml/m LA Vol (A4C): 61.5 ml 33.29 ml/m  AORTIC VALVE                PULMONIC VALVE AV Area (Vmax): 1.49 cm    PV Vmax:       0.94 m/s AV Vmax:        117.00 cm/s PV Peak grad:  3.5 mmHg AV Peak Grad:   5.5 mmHg LVOT Vmax:      68.50 cm/s LVOT Vmean:     47.400 cm/s LVOT VTI:       0.117 m  AORTA Ao Root diam: 3.10 cm Ao Asc diam:  3.10 cm MITRAL VALVE               TRICUSPID VALVE MV Area (PHT): 7.16 cm    TV Peak grad:   31.2 mmHg MV Decel Time: 106 msec    TV Vmax:        2.79 m/s MV E velocity: 90.80 cm/s                            SHUNTS                            Systemic VTI:  0.12 m                            Systemic Diam: 1.80 cm Serafina Royals MD Electronically signed by Serafina Royals MD Signature Date/Time: 03/15/2021/12:36:20 PM    Final        Management plans discussed with the patient, family and they are in agreement.  CODE STATUS:     Code Status Orders  (From admission, onward)           Start     Ordered   03/06/21 2037  Full code  Continuous        03/06/21 2037           Code Status History     This patient has a current code status but no historical code status.      Advance Directive Documentation    Flowsheet Row Most Recent Value  Type of Advance Directive Living will, Healthcare Power of Attorney  Pre-existing out of facility DNR order (yellow form or pink MOST form) --  "MOST" Form in Place? --       TOTAL TIME TAKING CARE OF THIS PATIENT: 35 minutes.    Loletha Grayer M.D on 03/16/2021 at 10:29 AM  Between 7am to 6pm - Pager - [phone]  After 6pm go to [email or web address] - password [email or web address]  [facility]  CC: Primary care physician; Sheral Apley

## 2021-03-16 NOTE — ED Notes (Signed)
Called lab to draw pts labs

## 2021-03-16 NOTE — Progress Notes (Signed)
Report called to Leisure centre manager at [facility] [employer]. Pt discharged via stretcher transport.

## 2021-03-18 ENCOUNTER — Other Ambulatory Visit (HOSPITAL_COMMUNITY): Payer: Self-pay

## 2021-03-20 ENCOUNTER — Other Ambulatory Visit: Payer: Medicare PPO

## 2021-03-20 ENCOUNTER — Ambulatory Visit: Payer: Medicare PPO | Admitting: Oncology

## 2021-03-20 ENCOUNTER — Ambulatory Visit: Payer: Medicare PPO

## 2021-03-30 ENCOUNTER — Other Ambulatory Visit: Payer: Self-pay | Admitting: Pharmacist

## 2021-03-30 ENCOUNTER — Other Ambulatory Visit (HOSPITAL_COMMUNITY): Payer: Self-pay

## 2021-03-30 DIAGNOSIS — C641 Malignant neoplasm of right kidney, except renal pelvis: Secondary | ICD-10-CM

## 2021-03-30 MED ORDER — AXITINIB 5 MG PO TABS
5.0000 mg | ORAL_TABLET | Freq: Two times a day (BID) | ORAL | 0 refills | Status: DC
  Filled 2021-03-30: qty 60, 30d supply, fill #0

## 2021-03-31 ENCOUNTER — Other Ambulatory Visit: Payer: Self-pay

## 2021-03-31 ENCOUNTER — Other Ambulatory Visit (HOSPITAL_COMMUNITY): Payer: Self-pay

## 2021-03-31 DIAGNOSIS — C641 Malignant neoplasm of right kidney, except renal pelvis: Secondary | ICD-10-CM

## 2021-03-31 MED ORDER — AXITINIB 5 MG PO TABS: 5.0000 mg | ORAL_TABLET | Freq: Two times a day (BID) | ORAL | 0 refills | Status: DC

## 2021-04-01 ENCOUNTER — Inpatient Hospital Stay: Payer: Medicare PPO | Attending: Oncology | Admitting: Oncology

## 2021-04-01 ENCOUNTER — Encounter: Payer: Self-pay | Admitting: Oncology

## 2021-04-01 ENCOUNTER — Other Ambulatory Visit: Payer: Self-pay

## 2021-04-01 ENCOUNTER — Telehealth: Payer: Self-pay | Admitting: Pharmacist

## 2021-04-01 ENCOUNTER — Other Ambulatory Visit (HOSPITAL_COMMUNITY): Payer: Self-pay

## 2021-04-01 ENCOUNTER — Inpatient Hospital Stay: Payer: Medicare PPO

## 2021-04-01 VITALS — BP 134/68 | HR 89 | Temp 97.6°F | Resp 18 | Wt 174.2 lb

## 2021-04-01 DIAGNOSIS — Z5112 Encounter for antineoplastic immunotherapy: Secondary | ICD-10-CM | POA: Diagnosis present

## 2021-04-01 DIAGNOSIS — E1169 Type 2 diabetes mellitus with other specified complication: Secondary | ICD-10-CM

## 2021-04-01 DIAGNOSIS — C649 Malignant neoplasm of unspecified kidney, except renal pelvis: Secondary | ICD-10-CM

## 2021-04-01 DIAGNOSIS — C641 Malignant neoplasm of right kidney, except renal pelvis: Secondary | ICD-10-CM | POA: Diagnosis present

## 2021-04-01 DIAGNOSIS — R918 Other nonspecific abnormal finding of lung field: Secondary | ICD-10-CM | POA: Diagnosis not present

## 2021-04-01 DIAGNOSIS — Z794 Long term (current) use of insulin: Secondary | ICD-10-CM

## 2021-04-01 LAB — CBC WITH DIFFERENTIAL/PLATELET
Abs Immature Granulocytes: 0.02 10*3/uL (ref 0.00–0.07)
Basophils Absolute: 0 10*3/uL (ref 0.0–0.1)
Basophils Relative: 1 %
Eosinophils Absolute: 0.2 10*3/uL (ref 0.0–0.5)
Eosinophils Relative: 3 %
HCT: 41.5 % (ref 36.0–46.0)
Hemoglobin: 12.8 g/dL (ref 12.0–15.0)
Immature Granulocytes: 0 %
Lymphocytes Relative: 23 %
Lymphs Abs: 1.5 10*3/uL (ref 0.7–4.0)
MCH: 26.8 pg (ref 26.0–34.0)
MCHC: 30.8 g/dL (ref 30.0–36.0)
MCV: 86.8 fL (ref 80.0–100.0)
Monocytes Absolute: 0.6 10*3/uL (ref 0.1–1.0)
Monocytes Relative: 9 %
Neutro Abs: 4.1 10*3/uL (ref 1.7–7.7)
Neutrophils Relative %: 64 %
Platelets: 284 10*3/uL (ref 150–400)
RBC: 4.78 MIL/uL (ref 3.87–5.11)
RDW: 14.3 % (ref 11.5–15.5)
WBC: 6.4 10*3/uL (ref 4.0–10.5)
nRBC: 0 % (ref 0.0–0.2)

## 2021-04-01 LAB — COMPREHENSIVE METABOLIC PANEL
ALT: 10 U/L (ref 0–44)
AST: 18 U/L (ref 15–41)
Albumin: 3 g/dL — ABNORMAL LOW (ref 3.5–5.0)
Alkaline Phosphatase: 94 U/L (ref 38–126)
Anion gap: 4 — ABNORMAL LOW (ref 5–15)
BUN: 16 mg/dL (ref 8–23)
CO2: 25 mmol/L (ref 22–32)
Calcium: 10 mg/dL (ref 8.9–10.3)
Chloride: 107 mmol/L (ref 98–111)
Creatinine, Ser: 0.65 mg/dL (ref 0.44–1.00)
GFR, Estimated: >60 mL/min (ref >60)
Glucose, Bld: 224 mg/dL — ABNORMAL HIGH (ref 70–99)
Potassium: 3.9 mmol/L (ref 3.5–5.1)
Sodium: 136 mmol/L (ref 135–145)
Total Bilirubin: 0.3 mg/dL (ref 0.3–1.2)
Total Protein: 6.3 g/dL — ABNORMAL LOW (ref 6.5–8.1)

## 2021-04-01 NOTE — Progress Notes (Signed)
Patient here post hospitalization follow up. Pt is in rehab and will be going back to [facility] in 2 days. Alyson form pharmacy provided pt with a bottle of Inlyta.

## 2021-04-01 NOTE — Telephone Encounter (Signed)
Oral Chemotherapy Pharmacist Encounter   Inlyta refill was hand delivered to patient during her office today. She will take the medication back with her to [facility].   Called [facility] and left a message for their nursing director Maudie Mercury to let her know that the patient would be returning to them with the medication. Left my phone number for her to call with any issues. The front desk at [facility] provided me with the fax number to send in the order for Inlyta administration (fax: [phone]). Fax sent.  Barbara Frank mentioned potentially leaving [facility] in a few days. Reminded her to take her Inlyta with her when she leaves.    Darl Pikes, PharmD, BCPS, BCOP, CPP Hematology/Oncology Clinical Pharmacist [facility]/AP [facility] [phone]  04/01/2021 10:56 AM

## 2021-04-01 NOTE — Progress Notes (Signed)
Hematology/Oncology Consult note [facility] Telephone:([phone] [phone]   Patient Care Team: Barbara Frank as PCP - General  REFERRING PROVIDER: No ref. provider found  CHIEF COMPLAINTS/REASON FOR VISIT:  Metastatic RCC  HISTORY OF PRESENTING ILLNESS:   Patient is a poor historian. Her mood appears to be depressed.  She currently lives at assisted living facility. Moved from [place]. Widowed 3 years ago.  Her power of attorney is Barbara Frank who is her husband's niece. Barbara Frank was called during this encounter.   Extensive medical record review was performed. She has had multiple images done during the past few months, 05/27/20 CT head wo contrast- no acute intracranial abnormality.  06/01/20 CT abdomen and pelvis with contrast 4 mm proximal left ureteral calculus causing mild proximal left- sided hydroureteronephrosis with periureteric and perinephric stranding on the left.5.2cm enhancing right mid anterior kidney mass. Mildly enlarged right retroperitoneal retrocaval lymph nodes suspicious for nodal metastases Mild cardiomegaly, Small hiatal hernia. Status post gastric bypass surgery and cholecystectomy. Postoperative changes anterior abdominal wall. 06/05/20 CT head wo contrast - No acute intracranial abnormality. Probable tiny right frontal scalp Contusion. 2. Mild global volume loss and minimal chronic ischemic small vessel disease. 06/06/20 CT abdomen pelvis wo contrast 1. Indeterminate lesion in the right kidney which is stable may be a solid mass., 2. Partially calcified uterine fibroids. 3. Left UVJ calculus measuring 5 mm 06/07/20 CT head wo contrast.  1. No acute intracranial abnormality. No significant change. 2. Intracranial atherosclerosis, atrophy and chronic small vessel ischemic changes. 3. Chronic/subacute right maxillary sinus disease.  06/07/20 Cystoscopy by Barbara Frank for Ureteroscopic stone extraction Admitted on  10/3/21due to failure to thrive,  anorexia, dehydration, electrolyte imbalances.   06/09/20 CT abdomen and chest with contrast 1. Right renal mass is stable in size. Most suggestive of a renal neoplasm. 2. Right retroperitoneal lymph nodes are stable in size; may well be metastatic. 3. Left hydronephrosis and hydroureter is stable. Pelvis is not imaged therefore if there is a distal calculus still present, it is not visible. 4. Sclerotic lesions in the anterior left seventh, eighth and ninth ribs which given distribution are most likely healed rib fractures.  She is on Eliquis 51m Bid for atrial fibrillation. She reports not taking anti depressants that were given by her PCP.  #11/04/2020, PET scan was obtained. PET scan showed interval increasing size of right upper lobe pulmonary lesion, now 3 cm with an SUV of 3.8.  Associated with a new right lower lobe nodule 8 mm.  5.1 cm right renal mass hypermetabolic activity.  1.9 retrocaval lymph node with SUV of 4.8.  Second index retrocaval node with SUV of 2.  Nodular disease seen in the duodenum on previous study is not well demonstrated on noncontrast CT image today.  Some nodular FDG activity, nonspecific. Multiple anterior abdominal soft tissue nodules are evident.  13 mm anterior left nodule shows no substantial hypermetabolic with an SUV of 1.7.  Calcified left lateral nodule with an SUV of 2.9.  2.2 cm calcified nodule lobe right paracolic gutter with an SUV of 2.5.  12/17/2020, right kidney mass biopsy pathology was reviewed and discussed with patient.  Pathology is consistent with RCC-demonstrates the morphology spectrum, papillary, solid and nested areas.  CD10 positive, racemase positive, CK7 negative.  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  TFE 3 break apart FISH testing is pending.  #01/05/2021, addendum pathology TFE3 break-apart FISH testing was performed and a translocation  involving Xp11 / TFE3 transcription  gene was not detected. HMB-45 IHC is negative  CAIX IHC demonstrates membranous staining.  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 female who has above history reviewed by me today presents for follow up visit for management of right renal clear-cell carcinoma, posthospitalization follow-up 03/06/2021-03/16/2021, patient was admitted due to acute delirium secondary to UTI/hematuria.  MRI brain was obtained during the admission and was negative for acute process.  Patient mental status improved.  UTI was treated with IV antibiotics and switched to p.o. antibiotic upon discharge.  Patient also was found to have rapid ventricular response with her atrial fibrillation.  Rate was treated with Cardizem drip during hospitalization because and switched to Toprol and Cardizem for rate control.  Eliquis for anticoagulation. Patient's axitinib was held during hospitalization. There was plan for patient to restart axitinib upon discharge.  However since patient was sent to rehab, he is not able to be started on axitinib due to lack of access to her medication supply.  [facility]  Patient presents for post hospitalization follow-up.  She does not have much recollection of her recent admission.  Reports feeling well today.  He denies any fever, chills, nausea vomiting diarrhea.  03/07/2021, CT abdomen pelvis  Right renal mass slightly enlarged since 10/03/2020.  5.4 x 4.9 x 6.4 comparing to previously 5.0 x 4.4 x 6.3.  Stable adjacent right retroperitoneal lymphadenopathy.  There is a new 1.5 cm nodule in the right lower lobe.  Mild stranding around the right renal mass.   Review of Systems  Constitutional:  Positive for fatigue. Negative for appetite change,  chills and fever.  HENT:   Negative for hearing loss and voice change.   Eyes:  Negative for eye problems.  Respiratory:  Negative for chest tightness and cough.   Cardiovascular:  Negative for chest pain.  Gastrointestinal:  Negative for abdominal distention, abdominal pain, blood in stool and constipation.  Endocrine: Negative for hot flashes.  Genitourinary:  Negative for difficulty urinating and frequency.   Musculoskeletal:  Negative for arthralgias.  Skin:  Negative for itching and rash.  Neurological:  Negative for extremity weakness.  Hematological:  Negative for adenopathy.  Psychiatric/Behavioral:  Positive for depression. Negative for confusion.    MEDICAL HISTORY:  Past Medical History:  Diagnosis Date   [place]   Depression    Diabetes mellitus without complication ([facility] [place]   History of kidney stones    Kidney mass     SURGICAL HISTORY: Past Surgical History:  Procedure Laterality Date   GASTRIC BYPASS     HERNIA REPAIR     TONSILLECTOMY     URETEROSCOPY WITH HOLMIUM LASER LITHOTRIPSY Left     SOCIAL HISTORY: Social History   Socioeconomic History   Marital status: Widowed    Spouse name: Not on file   Number of children: Not on file   Years of education: Not on file   Highest education level: Not on file  Occupational History   Not on file  Tobacco Use   Smoking status: Never   Smokeless tobacco: Never  Vaping Use   Vaping Use: Never used  Substance and Sexual Activity   Alcohol use: Not Currently   Drug use:  Not Currently   Sexual activity: Not Currently  Other Topics Concern   Not on file  Social History Narrative   Not on file   Social Determinants of Health   Financial Resource Strain: Not on file  Food Insecurity: Not on file  Transportation Needs: Not on file  Physical Activity: Not on file  Stress: Not on file  Social Connections: Not on file  Intimate Partner Violence: Not on file    FAMILY HISTORY: Family History   Problem Relation Age of Onset   Breast cancer Mother    Bladder Cancer Neg Hx    Kidney cancer Neg Hx    Prostate cancer Neg Hx     ALLERGIES:  is allergic to kiwi extract.  MEDICATIONS:  Current Outpatient Medications  Medication Sig Dispense Refill   acetaminophen (TYLENOL) 650 MG CR tablet Take 650 mg by mouth every 4 (four) hours as needed for pain or fever.     axitinib (INLYTA) 5 MG tablet Take 1 tablet (5 mg total) by mouth 2 (two) times daily. 60 tablet 0   cetirizine (ZYRTEC) 10 MG tablet Take 10 mg by mouth daily.     cyanocobalamin (,VITAMIN B-12,) 1000 MCG/ML injection Inject into the muscle.     diclofenac Sodium (VOLTAREN) 1 % GEL Apply 4 g topically 4 (four) times daily as needed (pain). 150 g 0   diltiazem (CARDIZEM CD) 180 MG 24 hr capsule Take 1 capsule (180 mg total) by mouth every evening. 30 capsule 0   ELIQUIS 5 MG TABS tablet Take 5 mg by mouth 2 (two) times daily.     feeding supplement, GLUCERNA SHAKE, (GLUCERNA SHAKE) LIQD Take 237 mLs by mouth 3 (three) times daily between meals. 21330 mL 0   insulin aspart (NOVOLOG) 100 UNIT/ML injection Inject 4 Units into the skin 3 (three) times daily with meals. (Patient taking differently: Inject 6 Units into the skin 3 (three) times daily with meals.) 10 mL 0   insulin glargine (LANTUS) 100 UNIT/ML injection Inject 27 Units into the skin 2 (two) times daily. 27 u in the morning and 27 u at night     Lactobacillus (PROBIOTIC ACIDOPHILUS PO) Take by mouth.     lactulose (CHRONULAC) 10 GM/15ML solution Take 30 mLs (20 g total) by mouth daily. 946 mL 0   metoprolol succinate (TOPROL-XL) 50 MG 24 hr tablet Take 50 mg by mouth daily. Take with or immediately following a meal.     nystatin (MYCOSTATIN/NYSTOP) powder Apply topically 3 (three) times daily. 15 g 0   Pollen Extracts (PROSTAT PO) Take by mouth.     Psyllium 48.57 % POWD Take by mouth. Takes 1 QD prn     No current facility-administered medications for this visit.      PHYSICAL EXAMINATION: ECOG PERFORMANCE STATUS: 2 - Symptomatic, <50% confined to bed Vitals:   04/01/21 1058  BP: 134/68  Pulse: 89  Resp: 18  Temp: 97.6 F (36.4 C)   Filed Weights   04/01/21 1058  Weight: 174 lb 3.2 oz (79 kg)    Physical Exam Constitutional:      General: She is not in acute distress.    Comments: Frail elderly female walks with a walker.   HENT:     Head: Normocephalic and atraumatic.  Eyes:     General: No scleral icterus. Cardiovascular:     Rate and Rhythm: Normal rate. Rhythm irregular.     Heart sounds: Normal heart sounds.  Pulmonary:  Effort: Pulmonary effort is normal. No respiratory distress.     Breath sounds: No wheezing.  Abdominal:     General: Bowel sounds are normal. There is no distension.     Palpations: Abdomen is soft.  Musculoskeletal:        General: No deformity. Normal range of motion.     Cervical back: Normal range of motion and neck supple.  Skin:    General: Skin is warm and dry.     Findings: No erythema or rash.  Neurological:     Mental Status: She is alert. Mental status is at baseline.     Cranial Nerves: No cranial nerve deficit.     Coordination: Coordination normal.    LABORATORY DATA:  I have reviewed the data as listed Lab Results  Component Value Date   WBC 6.4 04/01/2021   HGB 12.8 04/01/2021   HCT 41.5 04/01/2021   MCV 86.8 04/01/2021   PLT 284 04/01/2021   Recent Labs    02/27/21 1009 03/06/21 1629 03/12/21 0549 03/16/21 2203 04/01/21 1140  NA 138   < > 136 138 136  K 4.7   < > 3.7 4.6 3.9  CL 103   < > 102 102 107  CO2 26   < > '28 30 25  ' GLUCOSE 301*   < > 275* 181* 224*  BUN 14   < > '19 11 16  ' CREATININE 0.72   < > 0.36* 0.50 0.65  CALCIUM 10.6*   < > 10.1 10.7* 10.0  GFRNONAA >60   < > >60 >60 >60  PROT 6.8  --   --  6.9 6.3*  ALBUMIN 3.6  --   --  2.7* 3.0*  AST 22  --   --  19 18  ALT 14  --   --  11 10  ALKPHOS 92  --   --  87 94  BILITOT 0.7  --   --  0.6 0.3   <  > = values in this interval not displayed.    Iron/TIBC/Ferritin/ %Sat No results found for: IRON, TIBC, FERRITIN, IRONPCTSAT    RADIOGRAPHIC STUDIES: I have personally reviewed the radiological images as listed and agreed with the findings in the report. DG Knee 1-2 Views Left  Result Date: 03/11/2021 CLINICAL DATA:  History of pain. EXAM: LEFT KNEE - 1-2 VIEW COMPARISON:  No recent. FINDINGS: Diffuse osteopenia. Diffuse tricompartment with chondrocalcinosis. No evidence of fracture or dislocation. Small knee joint effusion. IMPRESSION: 1. Diffuse osteopenia. Diffuse tricompartment degenerative change with chondrocalcinosis. No acute bony abnormality. 2.  Small knee joint effusion. Electronically Signed   By: Marcello Moores  Register   On: 03/11/2021 13:41   CT Head Wo Contrast  Result Date: 03/06/2021 CLINICAL DATA:  Altered mental status. EXAM: CT HEAD WITHOUT CONTRAST TECHNIQUE: Contiguous axial images were obtained from the base of the skull through the vertex without intravenous contrast. COMPARISON:  None. FINDINGS: Brain: There is mild cerebral atrophy with widening of the extra-axial spaces and ventricular dilatation. There are areas of decreased attenuation within the white matter tracts of the supratentorial brain, consistent with microvascular disease changes. Vascular: No hyperdense vessel or unexpected calcification. Skull: Normal. Negative for fracture or focal lesion. Sinuses/Orbits: No acute finding. Other: None. IMPRESSION: 1. Generalized cerebral atrophy. 2. No acute intracranial abnormality. Electronically Signed   By: Virgina Norfolk M.D.   On: 03/06/2021 17:23   MR BRAIN WO CONTRAST  Result Date: 03/06/2021 CLINICAL DATA:  Encephalopathy EXAM: MRI HEAD  WITHOUT CONTRAST TECHNIQUE: Multiplanar, multiecho pulse sequences of the brain and surrounding structures were obtained without intravenous contrast. COMPARISON:  None. FINDINGS: Brain: No acute infarct, mass effect or extra-axial  collection. No acute or chronic hemorrhage. There is multifocal hyperintense T2-weighted signal within the white matter. Parenchymal volume and CSF spaces are normal. The midline structures are normal. Vascular: Major flow voids are preserved. Skull and upper cervical spine: Normal calvarium and skull base. Visualized upper cervical spine and soft tissues are normal. Sinuses/Orbits:No paranasal sinus fluid levels or advanced mucosal thickening. No mastoid or middle ear effusion. Normal orbits. IMPRESSION: 1. No acute intracranial abnormality. 2. Findings of chronic small vessel ischemia. Electronically Signed   By: Ulyses Jarred M.D.   On: 03/06/2021 22:23   CT ABDOMEN PELVIS W CONTRAST  Result Date: 03/07/2021 CLINICAL DATA:  75 year old with suspected bowel obstruction. Renal cell carcinoma. EXAM: CT ABDOMEN AND PELVIS WITH CONTRAST TECHNIQUE: Multidetector CT imaging of the abdomen and pelvis was performed using the standard protocol following bolus administration of intravenous contrast. CONTRAST:  111m OMNIPAQUE IOHEXOL 300 MG/ML  SOLN COMPARISON:  CT 10/03/2020 FINDINGS: Lower chest: New 1.5 cm peripheral nodule in the right lower lobe on sequence 5, image 8. 5 mm peripheral nodule in the right lower lobe on image 13 may have been present on the exam from 10/03/2020. No significant pleural fluid. Hepatobiliary: Cholecystectomy. Normal appearance of the liver. No significant biliary dilatation. Pancreas: Unremarkable. No pancreatic ductal dilatation or surrounding inflammatory changes. Spleen: Normal in size without focal abnormality. Adrenals/Urinary Tract: Normal adrenal glands. Normal appearance of the left kidney without hydronephrosis. Normal appearance of the urinary bladder with mild distention. Again noted is a large mass involving the anterior aspect of the right kidney which appears to be involving the right renal sinus and hilum. The right renal mass measures 5.4 x 4.9 x 6.4 cm and previously  measured 5.0 x 4.4 x 6.3 cm. Mild stranding around the right renal lesion that was not present on the previous examination. No evidence for rupture or hemorrhage. Stomach/Bowel: Prior gastric surgery with multiple surgical clips in the upper abdomen. Surgical findings are suggestive for a gastric bypass procedure. No evidence for acute bowel inflammation or obstruction. Vascular/Lymphatic: Normal caliber of the abdominal aorta. Main visceral arteries are patent. Again noted are enlarged right retroperitoneal lymph nodes in the retrocaval region. Index lymph node on sequence 2 image 25 measures 2.0 x 2.3 cm and minimally changed. Right renal mass is abutting the right renal vein but no clear evidence of intravascular involvement. Reproductive: Again noted is a small uterus with peripherally calcified fibroids. No evidence for an adnexal mass. Other: Negative for ascites. Extensive postoperative changes along the anterior abdominal wall. Musculoskeletal: Stable lucency involving the T12 right pedicle region. Mild sclerosis along the left side of the L1 vertebral body is also stable. No clear evidence for osseous metastatic disease. IMPRESSION: 1. Right renal mass is compatible with a renal cell carcinoma. This renal mass has slightly enlarged since 10/03/2020. The adjacent right retroperitoneal lymphadenopathy has minimally changed. However, there is a new 1.5 cm nodule in the right lower lobe. This is suggestive for new metastatic disease. 2. Mild stranding around the right renal mass without evidence of hemorrhage. 3. 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 No evidence for acute bowel inflammation or obstruction. Electronically Signed   By: AMarkus DaftM.D.   On: 03/07/2021 10:23   UK[place]Venous Img Lower Unilateral Right  Result Date: 03/16/2021 CLINICAL DATA:  Right knee pain EXAM: RIGHT LOWER EXTREMITY VENOUS DOPPLER ULTRASOUND TECHNIQUE: Gray-scale sonography with graded compression, as  well as color Doppler and duplex ultrasound were performed to evaluate the lower extremity deep venous systems from the level of the common femoral vein and including the common femoral, femoral, profunda femoral, popliteal and calf veins including the posterior tibial, peroneal and gastrocnemius veins when visible. The superficial great saphenous vein was also interrogated. Spectral Doppler was utilized to evaluate flow at rest and with distal augmentation maneuvers in the common femoral, femoral and popliteal veins. COMPARISON:  None. FINDINGS: Contralateral Common Femoral Vein: Respiratory phasicity is normal and symmetric with the symptomatic side. No evidence of thrombus. Normal compressibility. Common Femoral Vein: No evidence of thrombus. Normal compressibility, respiratory phasicity and response to augmentation. Saphenofemoral Junction: No evidence of thrombus. Normal compressibility and flow on color Doppler imaging. Profunda Femoral Vein: No evidence of thrombus. Normal compressibility and flow on color Doppler imaging. Femoral Vein: No evidence of thrombus. Normal compressibility, respiratory phasicity and response to augmentation. Popliteal Vein: No evidence of thrombus. Normal compressibility, respiratory phasicity and response to augmentation. Calf Veins: No evidence of thrombus. Normal compressibility and flow on color Doppler imaging. Superficial Great Saphenous Vein: No evidence of thrombus. Normal compressibility. Venous Reflux:  None. Other Findings:  None. IMPRESSION: No evidence of deep venous thrombosis. Electronically Signed   By: Inez Catalina M.D.   On: 03/16/2021 22:58   DG Chest Port 1 View  Result Date: 03/06/2021 CLINICAL DATA:  Shortness of breath.  Confusion. EXAM: PORTABLE CHEST 1 VIEW COMPARISON:  CT chest 10/03/2020 FINDINGS: Shallow inspiration. Heart size and pulmonary vascularity are normal for technique. Lungs are clear. No pleural effusions. No pneumothorax. Shuttle clips in  the upper abdomen. IMPRESSION: No active disease. Electronically Signed   By: Lucienne Capers M.D.   On: 03/06/2021 21:05   DG Knee Complete 4 Views Right  Result Date: 03/16/2021 CLINICAL DATA:  Right knee pain following fall several weeks ago, initial encounter EXAM: RIGHT KNEE - COMPLETE 4+ VIEW COMPARISON:  None. FINDINGS: Tricompartmental degenerative changes are noted. No joint effusion is seen. No acute fracture or dislocation is noted. Calcifications of the menisci are seen. No soft tissue abnormality is noted. IMPRESSION: Degenerative change without acute abnormality. Electronically Signed   By: Inez Catalina M.D.   On: 03/16/2021 22:21   ECHOCARDIOGRAM COMPLETE  Result Date: 03/15/2021    ECHOCARDIOGRAM REPORT   Patient Name:   RIKIA SUKHU Date of Exam: 03/15/2021 Medical Rec #:  [id number]       Height:       62.0 in Accession #:    [id number]      Weight:       184.5 lb Date of Birth:  [birth year]       BSA:          1.847 m Patient Age:    61 years        BP:           120/66 mmHg Patient Gender: F               HR:           88 bpm. Exam Location:  [facility] Procedure: 2D Echo Indications:    htn  History:        Patient has no prior history of Echocardiogram examinations.                 CHF.  Sonographer:    Lattie Haw  Absher RN Referring Phys: [id number] RICHARD Loup  1. Left ventricular ejection fraction, by estimation, is 60 to 65%. The left ventricle has normal function. The left ventricle has no regional wall motion abnormalities. Left ventricular diastolic parameters were normal.  2. Right ventricular systolic function is normal. The right ventricular size is normal.  3. Left atrial size was moderately dilated.  4. The mitral valve is normal in structure. Moderate mitral valve regurgitation.  5. The aortic valve is normal in structure. Aortic valve regurgitation is mild. FINDINGS  Left Ventricle: Left ventricular ejection fraction, by estimation, is 60 to 65%. The left ventricle has  normal function. The left ventricle has no regional wall motion abnormalities. The left ventricular internal cavity size was normal in size. There is  no left ventricular hypertrophy. Left ventricular diastolic parameters were normal. Right Ventricle: The right ventricular size is normal. No increase in right ventricular wall thickness. Right ventricular systolic function is normal. Left Atrium: Left atrial size was moderately dilated. Right Atrium: Right atrial size was normal in size. Pericardium: There is no evidence of pericardial effusion. Mitral Valve: The mitral valve is normal in structure. Moderate mitral valve regurgitation. Tricuspid Valve: The tricuspid valve is normal in structure. Tricuspid valve regurgitation is mild. Aortic Valve: The aortic valve is normal in structure. Aortic valve regurgitation is mild. Aortic valve peak gradient measures 5.5 mmHg. Pulmonic Valve: The pulmonic valve was normal in structure. Pulmonic valve regurgitation is not visualized. Aorta: The aortic root and ascending aorta are structurally normal, with no evidence of dilitation. IAS/Shunts: No atrial level shunt detected by color flow Doppler.  LEFT VENTRICLE PLAX 2D LVIDd:         4.03 cm  Diastology LVIDs:         2.28 cm  LV e' medial:    6.64 cm/s LV PW:         1.33 cm  LV E/e' medial:  13.7 LV IVS:        1.25 cm  LV e' lateral:   8.05 cm/s LVOT diam:     1.80 cm  LV E/e' lateral: 11.3 LV SV:         30 LV SV Index:   16 LVOT Area:     2.54 cm  RIGHT VENTRICLE RV Basal diam:  2.75 cm TAPSE (M-mode): 1.7 cm LEFT ATRIUM           Index       RIGHT ATRIUM           Index LA diam:      4.90 cm 2.65 cm/m  RA Area:     14.60 cm LA Vol (A2C): 62.1 ml 33.62 ml/m RA Volume:   33.90 ml  18.35 ml/m LA Vol (A4C): 61.5 ml 33.29 ml/m  AORTIC VALVE                PULMONIC VALVE AV Area (Vmax): 1.49 cm    PV Vmax:       0.94 m/s AV Vmax:        117.00 cm/s PV Peak grad:  3.5 mmHg AV Peak Grad:   5.5 mmHg LVOT Vmax:      68.50  cm/s LVOT Vmean:     47.400 cm/s LVOT VTI:       0.117 m  AORTA Ao Root diam: 3.10 cm Ao Asc diam:  3.10 cm MITRAL VALVE               TRICUSPID VALVE MV  Area (PHT): 7.16 cm    TV Peak grad:   31.2 mmHg MV Decel Time: 106 msec    TV Vmax:        2.79 m/s MV E velocity: 90.80 cm/s                            SHUNTS                            Systemic VTI:  0.12 m                            Systemic Diam: 1.80 cm Serafina Royals MD Electronically signed by Serafina Royals MD Signature Date/Time: 03/15/2021/12:36:20 PM    Final    DG HIP UNILAT WITH PELVIS 2-3 VIEWS LEFT  Result Date: 03/11/2021 CLINICAL DATA:  History of pain. EXAM: DG HIP (WITH OR WITHOUT PELVIS) 2-3V LEFT COMPARISON:  No prior. FINDINGS: Surgical clips noted over the pelvis. Degenerative changes lumbar spine and both hips. No acute bony abnormality. No evidence of fracture or dislocation. IMPRESSION: Diffuse osteopenia and degenerative change. No acute bony or joint abnormality identified. No evidence of fracture or dislocation. Electronically Signed   By: Marcello Moores  Register   On: 03/11/2021 13:43      ASSESSMENT & PLAN:  1. Renal cell carcinoma of right kidney ([place])   2. Encounter for antineoplastic immunotherapy   3. Lung mass   4. Type 2 diabetes mellitus with other specified complication, with long-term current use of insulin ([place])   5. Hypercalcemia    #Right kidney mass with retroperitoneal lymphadenopathy, lung mass and peritoneal nodularity. 03/07/2021 CT images were independently reviewed by me and discussed with patient. Although comparing to January study, renal mass has slightly increased in size and there is a new long nodule, since there was a significant delay of treatment until May 2022 due to patient's decision, it is possible that some of this progression happened before start of treatment. I would continue current treatment regimen for another 2-3 cycles and then reimage in the short-term. Check CBC and CMP today.  Labs  are available after her visit. We will schedule patient to proceed with Keytruda. Advised patient to resume axitinib 5 mg twice daily.  #Right upper lobe spiculated lung mass [ 2 x 1.5 x 1.7cm] maybe a second primary or lung metastatic disease. She is not interested in additional biopsy. New right lower lobe 1.5 cm nodule. Will obtain CT chest without contrast.    #Hypercalcemia, patient has PTH elevated.  Possibly hyperparathyroidism.  # Hyperglycemia,  DM.  Continue Lantus Lantus.    #Depression, she does not want to take any antidepressants. Follow-up with palliative care service.  All questions were answered. The patient knows to call the clinic with any problems questions or concerns.  Follow-up  Lab MD 3 weeks Keytruda possible IV fluid   [name], MD, PhD Hematology Oncology [facility] at [facility] Pager- [phone] 04/01/2021

## 2021-04-02 ENCOUNTER — Inpatient Hospital Stay: Payer: Medicare PPO

## 2021-04-02 VITALS — BP 128/66 | HR 85 | Temp 97.8°F | Resp 20

## 2021-04-02 DIAGNOSIS — C649 Malignant neoplasm of unspecified kidney, except renal pelvis: Secondary | ICD-10-CM

## 2021-04-02 DIAGNOSIS — Z5112 Encounter for antineoplastic immunotherapy: Secondary | ICD-10-CM | POA: Diagnosis not present

## 2021-04-02 MED ORDER — SODIUM CHLORIDE 0.9 % IV SOLN
200.0000 mg | Freq: Once | INTRAVENOUS | Status: AC
  Administered 2021-04-02: 200 mg via INTRAVENOUS
  Filled 2021-04-02: qty 8

## 2021-04-02 MED ORDER — SODIUM CHLORIDE 0.9 % IV SOLN
Freq: Once | INTRAVENOUS | Status: AC
  Filled 2021-04-02: qty 250

## 2021-04-02 NOTE — Patient Instructions (Signed)
[facility] [place] [facility]  Discharge Instructions: Thank you for choosing [facility] to provide your oncology and hematology care.  If you have a lab appointment with the Cancer Center, please go directly to the Cancer Center and check in at the registration area.  Wear comfortable clothing and clothing appropriate for easy access to any Portacath or PICC line.   We strive to give you quality time with your provider. You may need to reschedule your appointment if you arrive late (15 or more minutes).  Arriving late affects you and other patients whose appointments are after yours.  Also, if you miss three or more appointments without notifying the office, you may be dismissed from the clinic at the provider's discretion.      For prescription refill requests, have your pharmacy contact our office and allow 72 hours for refills to be completed.    Today you received the following chemotherapy and/or immunotherapy agents KEYTRUDA      To help prevent nausea and vomiting after your treatment, we encourage you to take your nausea medication as directed.  BELOW ARE SYMPTOMS THAT SHOULD BE REPORTED IMMEDIATELY: *FEVER GREATER THAN 100.4 F (38 C) OR HIGHER *CHILLS OR SWEATING *NAUSEA AND VOMITING THAT IS NOT CONTROLLED WITH YOUR NAUSEA MEDICATION *UNUSUAL SHORTNESS OF BREATH *UNUSUAL BRUISING OR BLEEDING *URINARY PROBLEMS (pain or burning when urinating, or frequent urination) *BOWEL PROBLEMS (unusual diarrhea, constipation, pain near the anus) TENDERNESS IN MOUTH AND THROAT WITH OR WITHOUT PRESENCE OF ULCERS (sore throat, sores in mouth, or a toothache) UNUSUAL RASH, SWELLING OR PAIN  UNUSUAL VAGINAL DISCHARGE OR ITCHING   Items with * indicate a potential emergency and should be followed up as soon as possible or go to the Emergency Department if any problems should occur.  Please show the CHEMOTHERAPY ALERT CARD or IMMUNOTHERAPY ALERT CARD at check-in to  the Emergency Department and triage nurse.  Should you have questions after your visit or need to cancel or reschedule your appointment, please contact [facility]CER CENTER  [facility]  [phone] and follow the prompts.  Office hours are 8:00 a.m. to 4:30 p.m. Monday - Friday. Please note that voicemails left after 4:00 p.m. may not be returned until the following business day.  We are closed weekends and major holidays. You have access to a nurse at all times for urgent questions. Please call the main number to the clinic [phone] and follow the prompts.  For any non-urgent questions, you may also contact your provider using MyChart. We now offer e-Visits for anyone 18 and older to request care online for non-urgent symptoms. For details visit [email or web address].   Also download the MyChart app! Go to the app store, search "MyChart", open the app, select , and log in with your MyChart username and password.  Due to Covid, a mask is required upon entering the hospital/clinic. If you do not have a mask, one will be given to you upon arrival. For doctor visits, patients may have 1 support person aged 18 or older with them. For treatment visits, patients cannot have anyone with them due to current Covid guidelines and our immunocompromised population.   Pembrolizumab injection What is this medication? PEMBROLIZUMAB (pem broe liz ue mab) is a monoclonal antibody. It is used to treat certain types of cancer. This medicine may be used for other purposes; ask your health care provider or pharmacist if you have questions. COMMON BRAND NAME(S): Keytruda What should I tell my care team   before I take this medication? They need to know if you have any of these conditions: autoimmune diseases like Crohn's disease, ulcerative colitis, or lupus have had or planning to have an allogeneic stem cell transplant (uses someone else's stem cells) history of organ  transplant history of chest radiation nervous system problems like myasthenia gravis or Guillain-Barre syndrome an unusual or allergic reaction to pembrolizumab, other medicines, foods, dyes, or preservatives pregnant or trying to get pregnant breast-feeding How should I use this medication? This medicine is for infusion into a vein. It is given by a health care professional in a hospital or clinic setting. A special MedGuide will be given to you before each treatment. Be sure to read this information carefully each time. Talk to your pediatrician regarding the use of this medicine in children. While this drug may be prescribed for children as young as 6 months for selected conditions, precautions do apply. Overdosage: If you think you have taken too much of this medicine contact a poison control center or emergency room at once. NOTE: This medicine is only for you. Do not share this medicine with others. What if I miss a dose? It is important not to miss your dose. Call your doctor or health care professional if you are unable to keep an appointment. What may interact with this medication? Interactions have not been studied. This list may not describe all possible interactions. Give your health care provider a list of all the medicines, herbs, non-prescription drugs, or dietary supplements you use. Also tell them if you smoke, drink alcohol, or use illegal drugs. Some items may interact with your medicine. What should I watch for while using this medication? Your condition will be monitored carefully while you are receiving this medicine. You may need blood work done while you are taking this medicine. Do not become pregnant while taking this medicine or for 4 months after stopping it. Women should inform their doctor if they wish to become pregnant or think they might be pregnant. There is a potential for serious side effects to an unborn child. Talk to your health care professional or  pharmacist for more information. Do not breast-feed an infant while taking this medicine or for 4 months after the last dose. What side effects may I notice from receiving this medication? Side effects that you should report to your doctor or health care professional as soon as possible: allergic reactions like skin rash, itching or hives, swelling of the face, lips, or tongue bloody or black, tarry breathing problems changes in vision chest pain chills confusion constipation cough diarrhea dizziness or feeling faint or lightheaded fast or irregular heartbeat fever flushing joint pain low blood counts - this medicine may decrease the number of white blood cells, red blood cells and platelets. You may be at increased risk for infections and bleeding. muscle pain muscle weakness pain, tingling, numbness in the hands or feet persistent headache redness, blistering, peeling or loosening of the skin, including inside the mouth signs and symptoms of high blood sugar such as dizziness; dry mouth; dry skin; fruity breath; nausea; stomach pain; increased hunger or thirst; increased urination signs and symptoms of kidney injury like trouble passing urine or change in the amount of urine signs and symptoms of liver injury like dark urine, light-colored stools, loss of appetite, nausea, right upper belly pain, yellowing of the eyes or skin sweating swollen lymph nodes weight loss Side effects that usually do not require medical attention (report to your doctor or   health care professional if they continue or are bothersome): decreased appetite hair loss tiredness This list may not describe all possible side effects. Call your doctor for medical advice about side effects. You may report side effects to FDA at 1-800-FDA-1088. Where should I keep my medication? This drug is given in a hospital or clinic and will not be stored at home. NOTE: This sheet is a summary. It may not cover all possible  information. If you have questions about this medicine, talk to your doctor, pharmacist, or health care provider.  2022 Elsevier/Gold Standard (2019-07-25 21:44:53)  

## 2021-04-02 NOTE — Progress Notes (Signed)
Nutrition Assessment   Reason for Assessment:  Weight loss   ASSESSMENT:  75 year old female with metastatic renal cell carcinoma.  Past medical history of afib, DM, gastric bypass, cholecystectomy, depression.  Recent hospital admission 7/1-7/11/22 for UTI. Patient on [place], and [place].    Met with patient during infusion.  Patient reports that her appetite is good. Eating peanut butter crackers during infusion.  Patient says that she lives at [facility].  Assisted Living prepares all meals for patient.  She normally eats eggs, grits, toast and coffee for breakfast. Lunch is usually a sandwich (egg salad).  Dinner varies, says she slept through dinner last night.  Has protein shakes in room (boost) and drinks maybe 2-3 times per week.  Denies nausea, diarrhea, constipation, trouble chewing or swallowing.    Noted seen by SLP and recommended mechanical soft with thin liquids.    Medications: reviewed   Labs: glucose 224   Anthropometrics:   Height: 62 inches Weight: 174 lb 3.2 oz on 7.27 184 lb on 7/1 178 lb on 6/24 181 lb on 5/27 170 lb on 4/20 BMI: 31  5% weight loss in 1 month if weight on 7/1 is accurate (hospital wt), significant   Estimated Energy Needs  Kcals: 1975-2300 Protein: 95-119 g Fluid: 1.9 L   NUTRITION DIAGNOSIS: Unintentional weight loss related to recent hospital admission (UTI) as evidenced by 5% weight loss in the last month   INTERVENTION:  Encouraged patient to drink shake at least 1 time per day.   Patient does not want to gain weight and is pleased with weight loss.  Explained importance of weight maintenance during treatment and negative side effects of continued unintentional weight loss.    MONITORING, EVALUATION, GOAL: weight trends, intake   Next Visit: Friday, August 19th during infusion  Shriyan Arakawa B. Zenia Resides, [place], [place] Registered Dietitian [phone] (mobile)

## 2021-04-03 ENCOUNTER — Other Ambulatory Visit (HOSPITAL_COMMUNITY): Payer: Self-pay

## 2021-04-06 ENCOUNTER — Inpatient Hospital Stay: Payer: Medicare PPO

## 2021-04-10 ENCOUNTER — Ambulatory Visit: Payer: Medicare PPO | Attending: Oncology

## 2021-04-21 ENCOUNTER — Other Ambulatory Visit (HOSPITAL_COMMUNITY): Payer: Self-pay

## 2021-04-23 ENCOUNTER — Other Ambulatory Visit (HOSPITAL_COMMUNITY): Payer: Self-pay

## 2021-04-24 ENCOUNTER — Inpatient Hospital Stay: Payer: Medicare PPO

## 2021-04-24 ENCOUNTER — Inpatient Hospital Stay: Payer: Medicare PPO | Admitting: Oncology

## 2021-04-24 ENCOUNTER — Telehealth: Payer: Self-pay | Admitting: Oncology

## 2021-04-24 ENCOUNTER — Inpatient Hospital Stay: Payer: Medicare PPO | Attending: Oncology

## 2021-04-24 NOTE — Progress Notes (Signed)
Nutrition  RD was planning to see patient during infusion today but patient did not come. Will follow-up as able  Barbara Frank B. Zenia Resides, [place], [place] Registered Dietitian [phone] (mobile)

## 2021-04-24 NOTE — Telephone Encounter (Signed)
Called pt to rescheduled missed appt on 8/19. Pt did not answer and the phone VM was not in working order. Pt didn't have her CT scan completed also.

## 2021-04-27 ENCOUNTER — Other Ambulatory Visit (HOSPITAL_COMMUNITY): Payer: Self-pay

## 2021-05-06 ENCOUNTER — Encounter: Payer: Self-pay | Admitting: Oncology

## 2021-05-06 ENCOUNTER — Inpatient Hospital Stay: Payer: Medicare PPO | Attending: Oncology

## 2021-05-06 ENCOUNTER — Inpatient Hospital Stay: Payer: Medicare PPO | Attending: Oncology | Admitting: Oncology

## 2021-05-06 ENCOUNTER — Inpatient Hospital Stay: Payer: Medicare PPO | Attending: Oncology | Admitting: Hospice and Palliative Medicine

## 2021-05-06 VITALS — BP 151/105 | HR 130 | Temp 96.3°F | Resp 20 | Wt 173.0 lb

## 2021-05-06 DIAGNOSIS — Z91199 Patient's noncompliance with other medical treatment and regimen due to unspecified reason: Secondary | ICD-10-CM

## 2021-05-06 DIAGNOSIS — F32A Depression, unspecified: Secondary | ICD-10-CM | POA: Diagnosis not present

## 2021-05-06 DIAGNOSIS — E119 Type 2 diabetes mellitus without complications: Secondary | ICD-10-CM | POA: Insufficient documentation

## 2021-05-06 DIAGNOSIS — C641 Malignant neoplasm of right kidney, except renal pelvis: Secondary | ICD-10-CM | POA: Diagnosis not present

## 2021-05-06 DIAGNOSIS — Z9119 Patient's noncompliance with other medical treatment and regimen: Secondary | ICD-10-CM | POA: Diagnosis not present

## 2021-05-06 DIAGNOSIS — Z9884 Bariatric surgery status: Secondary | ICD-10-CM | POA: Insufficient documentation

## 2021-05-06 DIAGNOSIS — Z87442 Personal history of urinary calculi: Secondary | ICD-10-CM | POA: Diagnosis not present

## 2021-05-06 DIAGNOSIS — R531 Weakness: Secondary | ICD-10-CM | POA: Insufficient documentation

## 2021-05-06 DIAGNOSIS — Z803 Family history of malignant neoplasm of breast: Secondary | ICD-10-CM | POA: Diagnosis not present

## 2021-05-06 DIAGNOSIS — Z7901 Long term (current) use of anticoagulants: Secondary | ICD-10-CM | POA: Insufficient documentation

## 2021-05-06 DIAGNOSIS — R739 Hyperglycemia, unspecified: Secondary | ICD-10-CM | POA: Diagnosis not present

## 2021-05-06 DIAGNOSIS — J984 Other disorders of lung: Secondary | ICD-10-CM | POA: Diagnosis not present

## 2021-05-06 DIAGNOSIS — Z79899 Other long term (current) drug therapy: Secondary | ICD-10-CM | POA: Insufficient documentation

## 2021-05-06 DIAGNOSIS — R590 Localized enlarged lymph nodes: Secondary | ICD-10-CM | POA: Insufficient documentation

## 2021-05-06 DIAGNOSIS — I4891 Unspecified atrial fibrillation: Secondary | ICD-10-CM | POA: Insufficient documentation

## 2021-05-06 DIAGNOSIS — N132 Hydronephrosis with renal and ureteral calculous obstruction: Secondary | ICD-10-CM | POA: Diagnosis not present

## 2021-05-06 DIAGNOSIS — Z515 Encounter for palliative care: Secondary | ICD-10-CM

## 2021-05-06 DIAGNOSIS — R5383 Other fatigue: Secondary | ICD-10-CM | POA: Insufficient documentation

## 2021-05-06 DIAGNOSIS — Z5112 Encounter for antineoplastic immunotherapy: Secondary | ICD-10-CM

## 2021-05-06 DIAGNOSIS — C649 Malignant neoplasm of unspecified kidney, except renal pelvis: Secondary | ICD-10-CM

## 2021-05-06 DIAGNOSIS — I6782 Cerebral ischemia: Secondary | ICD-10-CM | POA: Diagnosis not present

## 2021-05-06 LAB — CBC WITH DIFFERENTIAL/PLATELET
Abs Immature Granulocytes: 0.03 10*3/uL (ref 0.00–0.07)
Basophils Absolute: 0 10*3/uL (ref 0.0–0.1)
Basophils Relative: 0 %
Eosinophils Absolute: 0.1 10*3/uL (ref 0.0–0.5)
Eosinophils Relative: 2 %
HCT: 42.3 % (ref 36.0–46.0)
Hemoglobin: 13.4 g/dL (ref 12.0–15.0)
Immature Granulocytes: 0 %
Lymphocytes Relative: 19 %
Lymphs Abs: 1.5 10*3/uL (ref 0.7–4.0)
MCH: 26.5 pg (ref 26.0–34.0)
MCHC: 31.7 g/dL (ref 30.0–36.0)
MCV: 83.6 fL (ref 80.0–100.0)
Monocytes Absolute: 0.5 10*3/uL (ref 0.1–1.0)
Monocytes Relative: 7 %
Neutro Abs: 5.5 10*3/uL (ref 1.7–7.7)
Neutrophils Relative %: 72 %
Platelets: 236 10*3/uL (ref 150–400)
RBC: 5.06 MIL/uL (ref 3.87–5.11)
RDW: 14.6 % (ref 11.5–15.5)
WBC: 7.7 10*3/uL (ref 4.0–10.5)
nRBC: 0 % (ref 0.0–0.2)

## 2021-05-06 LAB — TSH: TSH: 1.931 u[IU]/mL (ref 0.350–4.500)

## 2021-05-06 LAB — COMPREHENSIVE METABOLIC PANEL
ALT: 10 U/L (ref 0–44)
AST: 18 U/L (ref 15–41)
Albumin: 3.8 g/dL (ref 3.5–5.0)
Alkaline Phosphatase: 101 U/L (ref 38–126)
Anion gap: 9 (ref 5–15)
BUN: 9 mg/dL (ref 8–23)
CO2: 26 mmol/L (ref 22–32)
Calcium: 10.3 mg/dL (ref 8.9–10.3)
Chloride: 97 mmol/L — ABNORMAL LOW (ref 98–111)
Creatinine, Ser: 0.68 mg/dL (ref 0.44–1.00)
GFR, Estimated: >60 mL/min (ref >60)
Glucose, Bld: 466 mg/dL — ABNORMAL HIGH (ref 70–99)
Potassium: 4.2 mmol/L (ref 3.5–5.1)
Sodium: 132 mmol/L — ABNORMAL LOW (ref 135–145)
Total Bilirubin: 0.8 mg/dL (ref 0.3–1.2)
Total Protein: 7.3 g/dL (ref 6.5–8.1)

## 2021-05-06 NOTE — Progress Notes (Signed)
Patient here for oncology follow-up appointment, expresses concerns of nausea, diarrhea, elevated BP/HR. Patient stated she stopped all medications because she was not feeling well.

## 2021-05-06 NOTE — Progress Notes (Signed)
[facility]  [phone] [phone]   Name: Calvary Streett Date: 05/06/2021 MRN: [id number]  DOB: November 20, 1945  Patient Care Team: Sheral Apley as PCP - Josem Kaufmann, MD as Consulting Physician (Hematology and Oncology)    REASON FOR CONSULTATION: Barbara Frank is a 75 y.o. female with multiple medical problems including A. fib on Eliquis, depression, and diabetes, who was admitted in October 2021 in [facility] for failure to thrive.  Work-up including abdominal CT chest/abdomen/pelvis revealed a right renal mass and retroperitoneal adenopathy concerning for renal cell carcinoma.  Patient saw Dr. Tasia Catchings and was not interested in pursuing aggressive diagnostic work-up or treatment.  Patient later agreed to systemic treatment. She was referred to palliative care to discuss goals.  SOCIAL HISTORY:     reports that she has never smoked. She has never used smokeless tobacco. She reports that she does not currently use alcohol. She reports that she does not currently use drugs.  Patient was previously living near [facility] in a motel. Patient now lives in [facility].  She is widowed 3 years ago.  She has no children.  Her only living family is her husband's niece.  Patient is a retired [employer] cabin crew.  ADVANCE DIRECTIVES:  Patient's HC POA husband's niece, [name]  CODE STATUS:   PAST MEDICAL HISTORY: Past Medical History:  Diagnosis Date   A-fib ([place])    Depression    Diabetes mellitus without complication ([place])    History of kidney stones    Kidney mass     PAST SURGICAL HISTORY:  Past Surgical History:  Procedure Laterality Date   GASTRIC BYPASS     HERNIA REPAIR     TONSILLECTOMY     URETEROSCOPY WITH HOLMIUM LASER LITHOTRIPSY Left     HEMATOLOGY/ONCOLOGY HISTORY:  Oncology History  Renal cell carcinoma ([place])  09/26/2020 Initial Diagnosis   Renal cell  carcinoma ([place])   12/24/2020 Cancer Staging   Staging form: Kidney, AJCC 8th Edition - Clinical stage from 12/24/2020: Stage IV (cT1b, cN1, cM1) - Signed by [name], MD on 12/24/2020   01/08/2021 -  Chemotherapy    Patient is on Treatment Plan: RENAL CELL PEMBROLIZUMAB         ALLERGIES:  is allergic to kiwi extract.  MEDICATIONS:  Current Outpatient Medications  Medication Sig Dispense Refill   acetaminophen (TYLENOL) 650 MG CR tablet Take 650 mg by mouth every 4 (four) hours as needed for pain or fever. (Patient not taking: Reported on 05/06/2021)     axitinib (INLYTA) 5 MG tablet Take 1 tablet (5 mg total) by mouth 2 (two) times daily. (Patient not taking: Reported on 05/06/2021) 60 tablet 0   cetirizine (ZYRTEC) 10 MG tablet Take 10 mg by mouth daily. (Patient not taking: Reported on 05/06/2021)     cyanocobalamin (,VITAMIN B-12,) 1000 MCG/ML injection Inject into the muscle. (Patient not taking: Reported on 05/06/2021)     diclofenac Sodium (VOLTAREN) 1 % GEL Apply 4 g topically 4 (four) times daily as needed (pain). (Patient not taking: Reported on 05/06/2021) 150 g 0   diltiazem (CARDIZEM CD) 180 MG 24 hr capsule Take 1 capsule (180 mg total) by mouth every evening. (Patient not taking: Reported on 05/06/2021) 30 capsule 0   ELIQUIS 5 MG TABS tablet Take 5 mg by mouth 2 (two) times daily. (Patient not taking: Reported on 05/06/2021)     feeding supplement, [place], (  GLUCERNA SHAKE) LIQD Take 237 mLs by mouth 3 (three) times daily between meals. (Patient not taking: Reported on 05/06/2021) 21330 mL 0   insulin aspart (NOVOLOG) 100 UNIT/ML injection Inject 4 Units into the skin 3 (three) times daily with meals. (Patient not taking: Reported on 05/06/2021) 10 mL 0   insulin glargine (LANTUS) 100 UNIT/ML injection Inject 27 Units into the skin 2 (two) times daily. 27 u in the morning and 27 u at night (Patient not taking: Reported on 05/06/2021)     Lactobacillus (PROBIOTIC ACIDOPHILUS PO)  Take by mouth. (Patient not taking: Reported on 05/06/2021)     lactulose (CHRONULAC) 10 GM/15ML solution Take 30 mLs (20 g total) by mouth daily. (Patient not taking: Reported on 05/06/2021) 946 mL 0   metoprolol succinate (TOPROL-XL) 50 MG 24 hr tablet Take 50 mg by mouth daily. Take with or immediately following a meal. (Patient not taking: Reported on 05/06/2021)     nystatin (MYCOSTATIN/NYSTOP) powder Apply topically 3 (three) times daily. (Patient not taking: Reported on 05/06/2021) 15 g 0   Pollen Extracts (PROSTAT PO) Take by mouth. (Patient not taking: Reported on 05/06/2021)     Psyllium 48.57 % POWD Take by mouth. Takes 1 QD prn (Patient not taking: Reported on 05/06/2021)     No current facility-administered medications for this visit.    VITAL SIGNS: There were no vitals taken for this visit. There were no vitals filed for this visit.  Estimated body mass index is 31.64 kg/m as calculated from the following:   Height as of 03/06/21: '5\' 2"'$  (1.575 m).   Weight as of an earlier encounter on 05/06/21: 173 lb (78.5 kg).  LABS: CBC:    Component Value Date/Time   WBC 7.7 05/06/2021 0950   HGB 13.4 05/06/2021 0950   HCT 42.3 05/06/2021 0950   PLT 236 05/06/2021 0950   MCV 83.6 05/06/2021 0950   NEUTROABS 5.5 05/06/2021 0950   LYMPHSABS 1.5 05/06/2021 0950   MONOABS 0.5 05/06/2021 0950   EOSABS 0.1 05/06/2021 0950   BASOSABS 0.0 05/06/2021 0950   Comprehensive Metabolic Panel:    Component Value Date/Time   NA 132 (L) 05/06/2021 0950   K 4.2 05/06/2021 0950   CL 97 (L) 05/06/2021 0950   CO2 26 05/06/2021 0950   BUN 9 05/06/2021 0950   CREATININE 0.68 05/06/2021 0950   GLUCOSE 466 (H) 05/06/2021 0950   CALCIUM 10.3 05/06/2021 0950   AST 18 05/06/2021 0950   ALT 10 05/06/2021 0950   ALKPHOS 101 05/06/2021 0950   BILITOT 0.8 05/06/2021 0950   PROT 7.3 05/06/2021 0950   ALBUMIN 3.8 05/06/2021 0950    RADIOGRAPHIC STUDIES: No results found.   PERFORMANCE STATUS (ECOG) :  2 - Symptomatic, <50% confined to bed  Review of Systems Unless otherwise noted, a complete review of systems is negative.  Physical Exam General: NAD, thin, frail-appearing Pulmonary: unlabored Extremities: no edema, no joint deformities Skin: no rashes Neurological: Weakness but otherwise nonfocal  IMPRESSION: Patient was added onto my clinic schedule today at Dr. Collie Siad request.  Patient was hospitalized 03/06/2021 to 03/16/2021 with acute delirium from UTI.  She was subsequently sent to [facility] for rehab.  Patient is now back independently at senior apartment.  Patient admits that she has been doing poorly recently.  She said that following rehab she had a significant amount of depression and stopped taking her medications.  Patient tells me that she knows that this could have resulted in recurrent hospitalization  or even death.  She says that she does not want to die nor was she trying to harm herself.  She says she is not sure why she stopped taking her medications.  However, she intends to now take the medications as directed.  She does continue to endorse depressive symptoms.  We discussed referral to psychiatry and patient was interested in this.  Note that she is quite hyperglycemic today.  Suspect that this is because she has not been taking her home meds.  Discussed referral to ER but patient declined.  Will consult home palliative care and ask for social worker to evaluate for home safety.  PLAN: -Continue current scope of treatment -Referral for home palliative care -Referral for psychiatry -Follow-up MyChart visit 1 month  Case and plan discussed with Dr. Tasia Catchings  Patient expressed understanding and was in agreement with this plan. She also understands that She can call the clinic at any time with any questions, concerns, or complaints.     Time Total: 25 minutes  Visit consisted of counseling and education dealing with the complex and emotionally intense issues of symptom  management and palliative care in the setting of serious and potentially life-threatening illness.Greater than 50%  of this time was spent counseling and coordinating care related to the above assessment and plan.  Signed by: Altha Harm, PhD, NP-C

## 2021-05-06 NOTE — Progress Notes (Signed)
Hematology/Oncology Consult note [facility] [phone] [phone]   Patient Care Team: Barbara Frank as PCP - Barbara Kaufmann, MD as Consulting Physician (Hematology and Oncology)  REFERRING PROVIDER: No ref. provider found  CHIEF COMPLAINTS/REASON FOR VISIT:  Metastatic RCC  HISTORY OF PRESENTING ILLNESS:   Patient is a poor historian. Her mood appears to be depressed.  She currently lives at assisted living facility. Moved from [place]. Widowed 3 years ago.  Her power of attorney is Barbara Frank who is her husband's niece. Barbara Frank was called during this encounter.   Extensive medical record review was performed. She has had multiple images done during the past few months, 05/27/20 CT head wo contrast- no acute intracranial abnormality.  06/01/20 CT abdomen and pelvis with contrast 4 mm proximal left ureteral calculus causing mild proximal left- sided hydroureteronephrosis with periureteric and perinephric stranding on the left.5.2cm enhancing right mid anterior kidney mass. Mildly enlarged right retroperitoneal retrocaval lymph nodes suspicious for nodal metastases Mild cardiomegaly, Small hiatal hernia. Status post gastric bypass surgery and cholecystectomy. Postoperative changes anterior abdominal wall. 06/05/20 CT head wo contrast - No acute intracranial abnormality. Probable tiny right frontal scalp Contusion. 2. Mild global volume loss and minimal chronic ischemic small vessel disease. 06/06/20 CT abdomen pelvis wo contrast 1. Indeterminate lesion in the right kidney which is stable may be a solid mass., 2. Partially calcified uterine fibroids. 3. Left UVJ calculus measuring 5 mm 06/07/20 CT head wo contrast.  1. No acute intracranial abnormality. No significant change. 2. Intracranial atherosclerosis, atrophy and chronic small vessel ischemic changes. 3. Chronic/subacute right maxillary sinus disease.  06/07/20 Cystoscopy by Barbara Frank  Barbara Frank for Ureteroscopic stone extraction Admitted on 10/3/21due to failure to thrive,  anorexia, dehydration, electrolyte imbalances.   06/09/20 CT abdomen and chest with contrast 1. Right renal mass is stable in size. Most suggestive of a renal neoplasm. 2. Right retroperitoneal lymph nodes are stable in size; may well be metastatic. 3. Left hydronephrosis and hydroureter is stable. Pelvis is not imaged therefore if there is a distal calculus still present, it is not visible. 4. Sclerotic lesions in the anterior left seventh, eighth and ninth ribs which given distribution are most likely healed rib fractures.  She is on Eliquis 3m Bid for atrial fibrillation. She reports not taking anti depressants that were given by her PCP.  #11/04/2020, PET scan was obtained. PET scan showed interval increasing size of right upper lobe pulmonary lesion, now 3 cm with an SUV of 3.8.  Associated with a new right lower lobe nodule 8 mm.  5.1 cm right renal mass hypermetabolic activity.  1.9 retrocaval lymph node with SUV of 4.8.  Second index retrocaval node with SUV of 2.  Nodular disease seen in the duodenum on previous study is not well demonstrated on noncontrast CT image today.  Some nodular FDG activity, nonspecific. Multiple anterior abdominal soft tissue nodules are evident.  13 mm anterior left nodule shows no substantial hypermetabolic with an SUV of 1.7.  Calcified left lateral nodule with an SUV of 2.9.  2.2 cm calcified nodule lobe right paracolic gutter with an SUV of 2.5.  12/17/2020, right kidney mass biopsy pathology was reviewed and discussed with patient.  Pathology is consistent with RCC-demonstrates the morphology spectrum, papillary, solid and nested areas.  CD10 positive, racemase positive, CK7 negative.  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  TFE 3 break apart FISH testing is pending.  #01/05/2021, addendum pathology  TFE3 break-apart FISH testing was performed  and a translocation  involving Xp11 / TFE3 transcription gene was not detected. HMB-45 IHC is negative  CAIX IHC demonstrates membranous staining.  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  MRI brain was obtained during the admission and was negative for acute process.  Patient mental status improved.  UTI was treated with IV antibiotics and switched to p.o. antibiotic upon discharge.  Patient also was found to have rapid ventricular response with her atrial fibrillation.  Rate was treated with Cardizem drip during hospitalization because and switched to Toprol and Cardizem for rate control.  Eliquis for anticoagulation. Patient's axitinib was held during hospitalization. There was plan for patient to restart axitinib upon discharge.  However since patient was sent to rehab, he is not able to be started on axitinib due to lack of access to her medication supply.  [facility]  Patient presents for post hospitalization follow-up.  She does not have much recollection of her recent admission.  Reports feeling well today.  He denies any fever, chills, nausea vomiting diarrhea.  03/07/2021, CT abdomen pelvis  Right renal mass slightly enlarged since 10/03/2020.  5.4 x 4.9 x 6.4 comparing to previously 5.0 x 4.4 x 6.3.  Stable adjacent right retroperitoneal lymphadenopathy.  There is a new 1.5 cm nodule in the right lower lobe.  Mild stranding around the right renal mass.  INTERVAL HISTORY LyJulita Frank a [age].o. female who has above history reviewed by me today presents for follow up visit for management of right renal clear-cell carcinoma Patient no showed to her CT scan appointment and office  appointment. Today she presents for follow-up Hospitalized between 03/06/2021-03/16/2021 due to acute delirium from UTI.  [facility] [place].  Following the rehab discharge, she has stopped all her medications because " not feeling well".  She says she knows that not taking medication is not good for her.  Denies any suicidal thoughts or ideation.  She feels very depressed. She denies any pain today.  Review of Systems  Constitutional:  Positive for fatigue. Negative for appetite change, chills and fever.  HENT:   Negative for hearing loss and voice change.   Eyes:  Negative for eye problems.  Respiratory:  Negative for chest tightness and cough.   Cardiovascular:  Negative for chest pain.  Gastrointestinal:  Negative for abdominal distention, abdominal pain, blood in stool and constipation.  Endocrine: Negative for hot flashes.  Genitourinary:  Negative for difficulty urinating and frequency.   Musculoskeletal:  Negative for arthralgias.  Skin:  Negative for itching and rash.  Neurological:  Negative for extremity weakness.  Hematological:  Negative for adenopathy.  Psychiatric/Behavioral:  Positive for depression. Negative for confusion.    MEDICAL HISTORY:  Past Medical History:  Diagnosis Date   A-fib ([facility]   Depression    Diabetes mellitus without complication ([facility]   History of kidney stones    Kidney mass     SURGICAL HISTORY: Past Surgical History:  Procedure Laterality Date   GASTRIC BYPASS     HERNIA REPAIR  TONSILLECTOMY     URETEROSCOPY WITH HOLMIUM LASER LITHOTRIPSY Left     SOCIAL HISTORY: Social History   Socioeconomic History   Marital status: Widowed    Spouse name: Not on file   Number of children: Not on file   Years of education: Not on file   Highest education level: Not on file  Occupational History   Not on file   Tobacco Use   Smoking status: Never   Smokeless tobacco: Never  Vaping Use   Vaping Use: Never used  Substance and Sexual Activity   Alcohol use: Not Currently   Drug use: Not Currently   Sexual activity: Not Currently  Other Topics Concern   Not on file  Social History Narrative   Not on file   Social Determinants of Health   Financial Resource Strain: Not on file  Food Insecurity: Not on file  Transportation Needs: Not on file  Physical Activity: Not on file  Stress: Not on file  Social Connections: Not on file  Intimate Partner Violence: Not on file    FAMILY HISTORY: Family History  Problem Relation Age of Onset   Breast cancer Mother    Bladder Cancer Neg Hx    Kidney cancer Neg Hx    Prostate cancer Neg Hx     ALLERGIES:  is allergic to kiwi extract.  MEDICATIONS:  Current Outpatient Medications  Medication Sig Dispense Refill   acetaminophen (TYLENOL) 650 MG CR tablet Take 650 mg by mouth every 4 (four) hours as needed for pain or fever. (Patient not taking: Reported on 05/06/2021)     axitinib (INLYTA) 5 MG tablet Take 1 tablet (5 mg total) by mouth 2 (two) times daily. (Patient not taking: Reported on 05/06/2021) 60 tablet 0   cetirizine (ZYRTEC) 10 MG tablet Take 10 mg by mouth daily. (Patient not taking: Reported on 05/06/2021)     cyanocobalamin (,VITAMIN B-12,) 1000 MCG/ML injection Inject into the muscle. (Patient not taking: Reported on 05/06/2021)     diclofenac Sodium (VOLTAREN) 1 % GEL Apply 4 g topically 4 (four) times daily as needed (pain). (Patient not taking: Reported on 05/06/2021) 150 g 0   diltiazem (CARDIZEM CD) 180 MG 24 hr capsule Take 1 capsule (180 mg total) by mouth every evening. (Patient not taking: Reported on 05/06/2021) 30 capsule 0   ELIQUIS 5 MG TABS tablet Take 5 mg by mouth 2 (two) times daily. (Patient not taking: Reported on 05/06/2021)     feeding supplement, GLUCERNA SHAKE, (GLUCERNA SHAKE) LIQD Take 237 mLs by mouth 3 (three)  times daily between meals. (Patient not taking: Reported on 05/06/2021) 21330 mL 0   insulin aspart (NOVOLOG) 100 UNIT/ML injection Inject 4 Units into the skin 3 (three) times daily with meals. (Patient not taking: Reported on 05/06/2021) 10 mL 0   insulin glargine (LANTUS) 100 UNIT/ML injection Inject 27 Units into the skin 2 (two) times daily. 27 u in the morning and 27 u at night (Patient not taking: Reported on 05/06/2021)     Lactobacillus (PROBIOTIC ACIDOPHILUS PO) Take by mouth. (Patient not taking: Reported on 05/06/2021)     lactulose (CHRONULAC) 10 GM/15ML solution Take 30 mLs (20 g total) by mouth daily. (Patient not taking: Reported on 05/06/2021) 946 mL 0   metoprolol succinate (TOPROL-XL) 50 MG 24 hr tablet Take 50 mg by mouth daily. Take with or immediately following a meal. (Patient not taking: Reported on 05/06/2021)     nystatin (MYCOSTATIN/NYSTOP) powder Apply topically 3 (  three) times daily. (Patient not taking: Reported on 05/06/2021) 15 g 0   Pollen Extracts (PROSTAT PO) Take by mouth. (Patient not taking: Reported on 05/06/2021)     Psyllium 48.57 % POWD Take by mouth. Takes 1 QD prn (Patient not taking: Reported on 05/06/2021)     No current facility-administered medications for this visit.     PHYSICAL EXAMINATION: ECOG PERFORMANCE STATUS: 2 - Symptomatic, <50% confined to bed Vitals:   05/06/21 1035 05/06/21 1037  BP: (!) 162/101 (!) 151/105  Pulse: (!) 126 (!) 130  Resp: 20   Temp: (!) 96.3 F (35.7 C)    Filed Weights   05/06/21 1035  Weight: 173 lb (78.5 kg)    Physical Exam Constitutional:      General: She is not in acute distress.    Comments: Frail elderly female walks with a walker.   HENT:     Head: Normocephalic and atraumatic.  Eyes:     General: No scleral icterus. Cardiovascular:     Rate and Rhythm: Normal rate. Rhythm irregular.     Heart sounds: Normal heart sounds.  Pulmonary:     Effort: Pulmonary effort is normal. No respiratory distress.      Breath sounds: No wheezing.  Abdominal:     General: Bowel sounds are normal. There is no distension.     Palpations: Abdomen is soft.  Musculoskeletal:        General: No deformity. Normal range of motion.     Cervical back: Normal range of motion and neck supple.  Skin:    General: Skin is warm and dry.     Findings: No erythema or rash.  Neurological:     Mental Status: She is alert and oriented to person, place, and time.     Cranial Nerves: No cranial nerve deficit.     Coordination: Coordination normal.  Psychiatric:     Comments: Depressed    LABORATORY DATA:  I have reviewed the data as listed Lab Results  Component Value Date   WBC 7.7 05/06/2021   HGB 13.4 05/06/2021   HCT 42.3 05/06/2021   MCV 83.6 05/06/2021   PLT 236 05/06/2021   Recent Labs    03/16/21 2203 04/01/21 1140 05/06/21 0950  NA 138 136 132*  K 4.6 3.9 4.2  CL 102 107 97*  CO2 '30 25 26  ' GLUCOSE 181* 224* 466*  BUN '11 16 9  ' CREATININE 0.50 0.65 0.68  CALCIUM 10.7* 10.0 10.3  GFRNONAA >60 >60 >60  PROT 6.9 6.3* 7.3  ALBUMIN 2.7* 3.0* 3.8  AST '19 18 18  ' ALT '11 10 10  ' ALKPHOS 87 94 101  BILITOT 0.6 0.3 0.8    Iron/TIBC/Ferritin/ %Sat No results found for: IRON, TIBC, FERRITIN, IRONPCTSAT    RADIOGRAPHIC STUDIES: I have personally reviewed the radiological images as listed and agreed with the findings in the report. No results found.    ASSESSMENT & PLAN:  1. Renal cell carcinoma of right kidney ([place])   2. Encounter for antineoplastic immunotherapy   3. Depression, unspecified depression type   4. Hyperglycemia   5. Noncompliance    #Right kidney mass with retroperitoneal lymphadenopathy, lung mass and peritoneal nodularity No-show to CT scan her recent chemotherapy appointment.  #Right upper lobe spiculated lung mass [ 2 x 1.5 x 1.7cm] maybe a second primary or lung metastatic disease. She is not interested in additional biopsy. New right lower lobe 1.5 cm nodule. Hold  off Treatment today due to  unstable vital sign[Tachycardia] and hyperglycemia [Glucose of 466] Discussed about ER evaluation and patient adamantly declined. She is orientated and feels to have the capacity of making her decision.  She intends to take her medications as directed. Obtain CT chest abdomen pelvis.  #Severe depression Patient previously declines Psychiatry referral. I had a lengthy discussion with patient.  She is now interested in seeing psychiatrist for evaluation.Currently she has no thoughts of harming herself.  Will refer to psychiatry  Discussed with palliative care [facility]. Refer to home palliative care and ask for social worker for evaluation of home safety.  All questions were answered. The patient knows to call the clinic with any problems questions or concerns.  Follow up In 4 weeks to review images and management plan.   [name], MD, PhD Hematology Oncology [place] [facility]  05/06/2021

## 2021-05-08 ENCOUNTER — Telehealth: Payer: Self-pay | Admitting: Student

## 2021-05-08 NOTE — Telephone Encounter (Signed)
Attempted to contact patient and patient's niece, [name], to offer to schedule a Palliative Consult, no answer.  Left message at both numbers with the reason for the call along with my name and call back number.

## 2021-05-12 ENCOUNTER — Other Ambulatory Visit (HOSPITAL_COMMUNITY): Payer: Self-pay

## 2021-05-15 ENCOUNTER — Telehealth: Payer: Self-pay | Admitting: Student

## 2021-05-15 NOTE — Telephone Encounter (Signed)
Attempted to contact patient to offer to schedule a Palliative Consult, no answer - left message requesting a return call to let me know if she wishes to pursue Palliative services or not, left my name and call back number.

## 2021-05-18 ENCOUNTER — Telehealth: Payer: Self-pay | Admitting: Oncology

## 2021-05-18 NOTE — Telephone Encounter (Signed)
Patient left message with answering service requesting an appt. Pt already has a lab/ CT appt on 9/26 and see MD on 9/28.  Left voicemail on 9/12 informing the patient to keep her scheduled appointments per Dr. Tasia Catchings.

## 2021-05-19 ENCOUNTER — Other Ambulatory Visit (HOSPITAL_COMMUNITY): Payer: Self-pay

## 2021-05-19 ENCOUNTER — Telehealth: Payer: Self-pay | Admitting: Student

## 2021-05-19 NOTE — Telephone Encounter (Signed)
Attempted to contact patient to offer to schedule a Palliative Consult - no answer - left message with reason for call along with my name and call back number, requesting a return call to schedule visit.

## 2021-05-22 ENCOUNTER — Telehealth: Payer: Self-pay | Admitting: Student

## 2021-05-22 ENCOUNTER — Telehealth: Payer: Self-pay

## 2021-05-22 NOTE — Telephone Encounter (Signed)
Received fax from Dr. Nicolasa Ducking (pshychiatrist) office stating that they are our of network. A referral was also entered in Epic for [place] [facility].

## 2021-05-22 NOTE — Telephone Encounter (Signed)
Attempted to contact patient again to schedule Consult, no answer - left VM.  I asked patient to call me back by Wed. 05/27/21 to let me know if she wanted to schedule a visit with [place] or not.  I told her that I needed to let the [place] know the status of this referral.  Left my name and call back number.    I also left a VM with patient's niece [name] and let her know what I had told patient on the voicemail that I left her.

## 2021-05-27 ENCOUNTER — Telehealth: Payer: Self-pay

## 2021-05-27 NOTE — Telephone Encounter (Signed)
RN called niece to schedule palliative consult.  Palliative team has made multiple attempts over past few weeks to schedule without success.  Niece has not been able to reach patient or get callback from patient in several months (niece states she knows patient is still at facility due to other resident's reports) and is calling independent facility again today.  RN notified referral NP that we will cancel referral for now until patient is able to be scheduled or calls [facility] back. Report to palliative admin team

## 2021-06-01 ENCOUNTER — Inpatient Hospital Stay: Payer: Medicare PPO | Attending: Oncology

## 2021-06-01 ENCOUNTER — Other Ambulatory Visit: Payer: Self-pay

## 2021-06-01 ENCOUNTER — Ambulatory Visit: Payer: Medicare PPO

## 2021-06-01 DIAGNOSIS — R918 Other nonspecific abnormal finding of lung field: Secondary | ICD-10-CM | POA: Insufficient documentation

## 2021-06-01 DIAGNOSIS — Z794 Long term (current) use of insulin: Secondary | ICD-10-CM | POA: Insufficient documentation

## 2021-06-01 DIAGNOSIS — E1165 Type 2 diabetes mellitus with hyperglycemia: Secondary | ICD-10-CM | POA: Diagnosis not present

## 2021-06-01 DIAGNOSIS — F32A Depression, unspecified: Secondary | ICD-10-CM | POA: Insufficient documentation

## 2021-06-01 DIAGNOSIS — C641 Malignant neoplasm of right kidney, except renal pelvis: Secondary | ICD-10-CM | POA: Insufficient documentation

## 2021-06-01 DIAGNOSIS — C649 Malignant neoplasm of unspecified kidney, except renal pelvis: Secondary | ICD-10-CM

## 2021-06-01 LAB — CBC WITH DIFFERENTIAL/PLATELET
Abs Immature Granulocytes: 0.01 10*3/uL (ref 0.00–0.07)
Basophils Absolute: 0 10*3/uL (ref 0.0–0.1)
Basophils Relative: 0 %
Eosinophils Absolute: 0.1 10*3/uL (ref 0.0–0.5)
Eosinophils Relative: 2 %
HCT: 43.3 % (ref 36.0–46.0)
Hemoglobin: 13.4 g/dL (ref 12.0–15.0)
Immature Granulocytes: 0 %
Lymphocytes Relative: 29 %
Lymphs Abs: 1.7 10*3/uL (ref 0.7–4.0)
MCH: 25.9 pg — ABNORMAL LOW (ref 26.0–34.0)
MCHC: 30.9 g/dL (ref 30.0–36.0)
MCV: 83.8 fL (ref 80.0–100.0)
Monocytes Absolute: 0.5 10*3/uL (ref 0.1–1.0)
Monocytes Relative: 9 %
Neutro Abs: 3.4 10*3/uL (ref 1.7–7.7)
Neutrophils Relative %: 60 %
Platelets: 213 10*3/uL (ref 150–400)
RBC: 5.17 MIL/uL — ABNORMAL HIGH (ref 3.87–5.11)
RDW: 14.5 % (ref 11.5–15.5)
WBC: 5.7 10*3/uL (ref 4.0–10.5)
nRBC: 0 % (ref 0.0–0.2)

## 2021-06-01 LAB — COMPREHENSIVE METABOLIC PANEL
ALT: 10 U/L (ref 0–44)
AST: 18 U/L (ref 15–41)
Albumin: 3.8 g/dL (ref 3.5–5.0)
Alkaline Phosphatase: 101 U/L (ref 38–126)
Anion gap: 9 (ref 5–15)
BUN: 11 mg/dL (ref 8–23)
CO2: 30 mmol/L (ref 22–32)
Calcium: 10.3 mg/dL (ref 8.9–10.3)
Chloride: 94 mmol/L — ABNORMAL LOW (ref 98–111)
Creatinine, Ser: 0.66 mg/dL (ref 0.44–1.00)
GFR, Estimated: >60 mL/min (ref >60)
Glucose, Bld: 378 mg/dL — ABNORMAL HIGH (ref 70–99)
Potassium: 3.6 mmol/L (ref 3.5–5.1)
Sodium: 133 mmol/L — ABNORMAL LOW (ref 135–145)
Total Bilirubin: 0.8 mg/dL (ref 0.3–1.2)
Total Protein: 7 g/dL (ref 6.5–8.1)

## 2021-06-03 ENCOUNTER — Encounter: Payer: Self-pay | Admitting: Oncology

## 2021-06-03 ENCOUNTER — Other Ambulatory Visit (HOSPITAL_COMMUNITY): Payer: Self-pay

## 2021-06-03 ENCOUNTER — Inpatient Hospital Stay (HOSPITAL_BASED_OUTPATIENT_CLINIC_OR_DEPARTMENT_OTHER): Payer: Medicare PPO | Admitting: Oncology

## 2021-06-03 ENCOUNTER — Other Ambulatory Visit: Payer: Self-pay

## 2021-06-03 VITALS — BP 155/92 | HR 101 | Temp 97.2°F | Resp 18 | Wt 167.0 lb

## 2021-06-03 DIAGNOSIS — R918 Other nonspecific abnormal finding of lung field: Secondary | ICD-10-CM

## 2021-06-03 DIAGNOSIS — C641 Malignant neoplasm of right kidney, except renal pelvis: Secondary | ICD-10-CM | POA: Diagnosis not present

## 2021-06-03 DIAGNOSIS — F32A Depression, unspecified: Secondary | ICD-10-CM | POA: Diagnosis not present

## 2021-06-03 DIAGNOSIS — R739 Hyperglycemia, unspecified: Secondary | ICD-10-CM | POA: Diagnosis not present

## 2021-06-03 DIAGNOSIS — Z5112 Encounter for antineoplastic immunotherapy: Secondary | ICD-10-CM

## 2021-06-03 NOTE — Progress Notes (Signed)
Pt here for follow up. Pt had very had time thinking about what medications she is currently taking. She seems a bit confused today.

## 2021-06-03 NOTE — Progress Notes (Signed)
Hematology/Oncology progress  note [facility] [phone] [phone]   Patient Care Team: [facility] as PCP - Barbara Kaufmann, MD as Consulting Physician (Hematology and Oncology)  REFERRING PROVIDER: No ref. provider found  CHIEF COMPLAINTS/REASON FOR VISIT:  Metastatic RCC  HISTORY OF PRESENTING ILLNESS:   Patient is a poor historian. Her mood appears to be depressed.  [facility]. Moved from [place]. Widowed 3 years ago.  Her power of attorney is [facility] who is her husband's niece. [facility] was called during this encounter.   Extensive medical record review was performed. She has had multiple images done during the past few months, 05/27/20 CT head wo contrast- no acute intracranial abnormality.  06/01/20 CT abdomen and pelvis with contrast 4 mm proximal left ureteral calculus causing mild proximal left- sided hydroureteronephrosis with periureteric and perinephric stranding on the left.5.2cm enhancing right mid anterior kidney mass. Mildly enlarged right retroperitoneal retrocaval lymph nodes suspicious for nodal metastases Mild cardiomegaly, Small hiatal hernia. Status post gastric bypass surgery and cholecystectomy. Postoperative changes anterior abdominal wall. 06/05/20 CT head wo contrast - No acute intracranial abnormality. Probable tiny right frontal scalp Contusion. 2. Mild global volume loss and minimal chronic ischemic small vessel disease. 06/06/20 CT abdomen pelvis wo contrast 1. Indeterminate lesion in the right kidney which is stable may be a solid mass., 2. Partially calcified uterine fibroids. 3. Left UVJ calculus measuring 5 mm 06/07/20 CT head wo contrast.  1. No acute intracranial abnormality. No significant change. 2. Intracranial atherosclerosis, atrophy and chronic small vessel ischemic changes. 3. Chronic/subacute right maxillary sinus disease.  06/07/20 Cystoscopy by  [facility] for Ureteroscopic stone extraction Admitted on 10/3/21due to failure to thrive,  anorexia, dehydration, electrolyte imbalances.   06/09/20 CT abdomen and chest with contrast 1. Right renal mass is stable in size. Most suggestive of a renal neoplasm. 2. Right retroperitoneal lymph nodes are stable in size; may well be metastatic. 3. Left hydronephrosis and hydroureter is stable. Pelvis is not imaged therefore if there is a distal calculus still present, it is not visible. 4. Sclerotic lesions in the anterior left seventh, eighth and ninth ribs which given distribution are most likely healed rib fractures.  She is on Eliquis 28m Bid for atrial fibrillation. She reports not taking anti depressants that were given by her PCP.  #11/04/2020, PET scan was obtained. PET scan showed interval increasing size of right upper lobe pulmonary lesion, now 3 cm with an SUV of 3.8.  Associated with a new right lower lobe nodule 8 mm.  5.1 cm right renal mass hypermetabolic activity.  1.9 retrocaval lymph node with SUV of 4.8.  Second index retrocaval node with SUV of 2.  Nodular disease seen in the duodenum on previous study is not well demonstrated on noncontrast CT image today.  Some nodular FDG activity, nonspecific. Multiple anterior abdominal soft tissue nodules are evident.  13 mm anterior left nodule shows no substantial hypermetabolic with an SUV of 1.7.  Calcified left lateral nodule with an SUV of 2.9.  2.2 cm calcified nodule lobe right paracolic gutter with an SUV of 2.5.  12/17/2020, right kidney mass biopsy pathology was reviewed and discussed with patient.  Pathology is consistent with RCC-demonstrates the morphology spectrum, papillary, solid and nested areas.  CD10 positive, racemase positive, CK7 negative.  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  TFE 3 break apart FISH testing is pending.  #01/05/2021, addendum  pathology TFE3 break-apart FISH testing was  performed and a translocation  involving Xp11 / TFE3 transcription gene was not detected. HMB-45 IHC is negative  CAIX IHC demonstrates membranous staining.  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  MRI brain was obtained during the admission and was negative for acute process.  Patient mental status improved.  UTI was treated with IV antibiotics and switched to p.o. antibiotic upon discharge.  Patient also was found to have rapid ventricular response with her atrial fibrillation.  Rate was treated with Cardizem drip during hospitalization because and switched to Toprol and Cardizem for rate control.  Eliquis for anticoagulation. Patient's axitinib was held during hospitalization. There was plan for patient to restart axitinib upon discharge.  However since patient was sent to rehab, he is not able to be started on axitinib due to lack of access to her medication supply.  She is going to be discharged in 2 days back to [facility]  Patient presents for post hospitalization follow-up.  She does not have much recollection of her recent admission.  Reports feeling well today.  He denies any fever, chills, nausea vomiting diarrhea.  03/07/2021, CT abdomen pelvis  Right renal mass slightly enlarged since 10/03/2020.  5.4 x 4.9 x 6.4 comparing to previously 5.0 x 4.4 x 6.3.  Stable adjacent right retroperitoneal lymphadenopathy.  There is a new 1.5 cm nodule in the right lower lobe.  Mild stranding around the right renal mass.  Hospitalized between 03/06/2021-03/16/2021 due to acute delirium from UTI.  [facility] and now [facility] at a [facility] in [place]  INTERVAL HISTORY Barbara Niermans a [age].o.  female who has above history reviewed by me today presents for follow up visit for management of right renal clear-cell carcinoma Patient again no showed to her CT scan appointment. Today she reports feeling ok, denies any pain.  She reports that she has been taking her medication.  Palliative care service has not been able to reach her.  [facility] as previous office is not in her network.   Review of Systems  Constitutional:  Positive for fatigue. Negative for appetite change, chills and fever.  HENT:   Negative for hearing loss and voice change.   Eyes:  Negative for eye problems.  Respiratory:  Negative for chest tightness and cough.   Cardiovascular:  Negative for chest pain.  Gastrointestinal:  Negative for abdominal distention, abdominal pain, blood in stool and constipation.  Endocrine: Negative for hot flashes.  Genitourinary:  Negative for difficulty urinating and frequency.   Musculoskeletal:  Negative for arthralgias.  Skin:  Negative for itching and rash.  Neurological:  Negative for extremity weakness.  Hematological:  Negative for adenopathy.  Psychiatric/Behavioral:  Positive for depression. Negative for confusion.    MEDICAL HISTORY:  Past Medical History:  Diagnosis Date   A-fib ([facility]   Depression    Diabetes mellitus without complication ([facility]   History of kidney stones    Kidney mass     SURGICAL HISTORY: Past Surgical History:  Procedure Laterality Date   GASTRIC BYPASS     HERNIA REPAIR     TONSILLECTOMY     URETEROSCOPY WITH HOLMIUM LASER LITHOTRIPSY  Left     SOCIAL HISTORY: Social History   Socioeconomic History   Marital status: Widowed    Spouse name: Not on file   Number of children: Not on file   Years of education: Not on file   Highest education level: Not on file  Occupational History   Not on file  Tobacco Use   Smoking status: Never   Smokeless tobacco: Never  Vaping Use   Vaping Use:  Never used  Substance and Sexual Activity   Alcohol use: Not Currently   Drug use: Not Currently   Sexual activity: Not Currently  Other Topics Concern   Not on file  Social History Narrative   Not on file   Social Determinants of Health   Financial Resource Strain: Not on file  Food Insecurity: Not on file  Transportation Needs: Not on file  Physical Activity: Not on file  Stress: Not on file  Social Connections: Not on file  Intimate Partner Violence: Not on file    FAMILY HISTORY: Family History  Problem Relation Age of Onset   Breast cancer Mother    Bladder Cancer Neg Hx    Kidney cancer Neg Hx    Prostate cancer Neg Hx     ALLERGIES:  is allergic to kiwi extract.  MEDICATIONS:  Current Outpatient Medications  Medication Sig Dispense Refill   axitinib (INLYTA) 5 MG tablet Take 1 tablet (5 mg total) by mouth 2 (two) times daily. 60 tablet 0   diltiazem (CARDIZEM CD) 180 MG 24 hr capsule Take 1 capsule (180 mg total) by mouth every evening. 30 capsule 0   ELIQUIS 5 MG TABS tablet Take 5 mg by mouth 2 (two) times daily.     feeding supplement, GLUCERNA SHAKE, (GLUCERNA SHAKE) LIQD Take 237 mLs by mouth 3 (three) times daily between meals. 21330 mL 0   Lactobacillus (PROBIOTIC ACIDOPHILUS PO) Take by mouth.     lactulose (CHRONULAC) 10 GM/15ML solution Take 30 mLs (20 g total) by mouth daily. 946 mL 0   metoprolol succinate (TOPROL-XL) 50 MG 24 hr tablet Take 50 mg by mouth daily. Take with or immediately following a meal.     acetaminophen (TYLENOL) 650 MG CR tablet Take 650 mg by mouth every 4 (four) hours as needed for pain or fever. (Patient not taking: Reported on 06/03/2021)     cetirizine (ZYRTEC) 10 MG tablet Take 10 mg by mouth daily. (Patient not taking: No sig reported)     cyanocobalamin (,VITAMIN B-12,) 1000 MCG/ML injection Inject into the muscle. (Patient not taking: No sig reported)     diclofenac Sodium (VOLTAREN) 1 % GEL Apply 4 g topically 4 (four)  times daily as needed (pain). (Patient not taking: No sig reported) 150 g 0   insulin aspart (NOVOLOG) 100 UNIT/ML injection Inject 4 Units into the skin 3 (three) times daily with meals. (Patient not taking: No sig reported) 10 mL 0   insulin glargine (LANTUS) 100 UNIT/ML injection Inject 27 Units into the skin 2 (two) times daily. 27 u in the morning and 27 u at night (Patient not taking: No sig reported)     metFORMIN (GLUCOPHAGE-XR) 500 MG 24 hr tablet SMARTSIG:4 Tablet(s) By Mouth Every Evening     nystatin (MYCOSTATIN/NYSTOP) powder Apply topically 3 (three) times daily. (Patient not taking: No sig reported) 15 g 0   Pollen Extracts (PROSTAT PO) Take by mouth. (Patient not taking: No sig reported)     Psyllium 48.57 % POWD  Take by mouth. Takes 1 QD prn (Patient not taking: No sig reported)     No current facility-administered medications for this visit.     PHYSICAL EXAMINATION: ECOG PERFORMANCE STATUS: 2 - Symptomatic, <50% confined to bed Vitals:   06/03/21 1428  BP: (!) 155/92  Pulse: (!) 101  Resp: 18  Temp: (!) 97.2 F (36.2 C)   Filed Weights   06/03/21 1428  Weight: 167 lb (75.8 kg)    Physical Exam Constitutional:      General: She is not in acute distress.    Comments: Frail elderly female walks with a walker.   HENT:     Head: Normocephalic and atraumatic.  Eyes:     General: No scleral icterus. Cardiovascular:     Rate and Rhythm: Normal rate. Rhythm irregular.     Heart sounds: Normal heart sounds.  Pulmonary:     Effort: Pulmonary effort is normal. No respiratory distress.     Breath sounds: No wheezing.  Abdominal:     General: Bowel sounds are normal. There is no distension.     Palpations: Abdomen is soft.  Musculoskeletal:        General: No deformity. Normal range of motion.     Cervical back: Normal range of motion and neck supple.  Skin:    General: Skin is warm and dry.     Findings: No erythema or rash.  Neurological:     Mental Status:  She is alert and oriented to person, place, and time.     Cranial Nerves: No cranial nerve deficit.     Coordination: Coordination normal.  Psychiatric:     Comments: Depressed    LABORATORY DATA:  I have reviewed the data as listed Lab Results  Component Value Date   WBC 5.7 06/01/2021   HGB 13.4 06/01/2021   HCT 43.3 06/01/2021   MCV 83.8 06/01/2021   PLT 213 06/01/2021   Recent Labs    04/01/21 1140 05/06/21 0950 06/01/21 1003  NA 136 132* 133*  K 3.9 4.2 3.6  CL 107 97* 94*  CO2 _0 GLUCOSE 224* 466* 378*  BUN _1 CREATININE 0.65 0.68 0.66  CALCIUM 10.0 10.3 10.3  GFRNONAA >60 >60 >60  PROT 6.3* 7.3 7.0  ALBUMIN 3.0* 3.8 3.8  AST _2 ALT _3 ALKPHOS 94 101 101  BILITOT 0.3 0.8 0.8    Iron/TIBC/Ferritin/ %Sat No results found for: IRON, TIBC, FERRITIN, IRONPCTSAT    RADIOGRAPHIC STUDIES: I have personally reviewed the radiological images as listed and agreed with the findings in the report. No results found.    ASSESSMENT & PLAN:  1. Renal cell carcinoma of right kidney ([place])   2. Depression, unspecified depression type   3. Encounter for antineoplastic immunotherapy   4. Hyperglycemia   5. Lung mass    #Right kidney mass with retroperitoneal lymphadenopathy, lung mass and peritoneal nodularity No-show to CT scan her recent chemotherapy appointment.  03/16/2021 Right upper lobe spiculated lung mass [ 2 x 1.5 x 1.7cm] maybe a second primary or lung metastatic disease. She is not interested in additional biopsy. New right lower lobe 1.5 cm nodule. Labs are reviewed and discussed with patient. Discussed with patient about resuming Keytruda, she agrees. She will proceed with lab [place] on 10/3 Hold axitinib  She agrees to have CT chest abdomen pelvis rescheduled.   #Severe depression Refer to  psychiatry  # uncontrolled DM follow  up with pcp Continue follow up with palliative care All questions were answered. The patient  knows to call the clinic with any problems questions or concerns.  Follow up In 3 weeks to review images and lab md Octaviano Batty, MD, PhD Hematology Oncology [facility]  06/03/2021

## 2021-06-04 ENCOUNTER — Other Ambulatory Visit: Payer: Self-pay

## 2021-06-04 DIAGNOSIS — C641 Malignant neoplasm of right kidney, except renal pelvis: Secondary | ICD-10-CM

## 2021-06-08 ENCOUNTER — Inpatient Hospital Stay: Payer: Medicare PPO | Admitting: Hospice and Palliative Medicine

## 2021-06-08 ENCOUNTER — Inpatient Hospital Stay: Payer: Medicare PPO

## 2021-06-08 DIAGNOSIS — E1165 Type 2 diabetes mellitus with hyperglycemia: Secondary | ICD-10-CM | POA: Insufficient documentation

## 2021-06-08 DIAGNOSIS — F32A Depression, unspecified: Secondary | ICD-10-CM | POA: Insufficient documentation

## 2021-06-08 DIAGNOSIS — Z794 Long term (current) use of insulin: Secondary | ICD-10-CM | POA: Insufficient documentation

## 2021-06-08 DIAGNOSIS — Z79899 Other long term (current) drug therapy: Secondary | ICD-10-CM | POA: Insufficient documentation

## 2021-06-08 DIAGNOSIS — R918 Other nonspecific abnormal finding of lung field: Secondary | ICD-10-CM | POA: Insufficient documentation

## 2021-06-08 DIAGNOSIS — C641 Malignant neoplasm of right kidney, except renal pelvis: Secondary | ICD-10-CM | POA: Insufficient documentation

## 2021-06-16 ENCOUNTER — Other Ambulatory Visit (HOSPITAL_COMMUNITY): Payer: Self-pay

## 2021-06-23 ENCOUNTER — Ambulatory Visit
Admission: RE | Admit: 2021-06-23 | Discharge: 2021-06-23 | Disposition: A | Discharge status: Home / Self Care | Payer: Medicare PPO | Source: Ambulatory Visit | Attending: Oncology | Admitting: Oncology

## 2021-06-23 DIAGNOSIS — C641 Malignant neoplasm of right kidney, except renal pelvis: Secondary | ICD-10-CM | POA: Diagnosis not present

## 2021-06-23 MED ORDER — IOHEXOL 300 MG/ML  SOLN
100.0000 mL | Freq: Once | INTRAMUSCULAR | Status: AC | PRN
  Administered 2021-06-23: 100 mL via INTRAVENOUS

## 2021-06-29 ENCOUNTER — Inpatient Hospital Stay: Payer: Medicare PPO | Attending: Oncology

## 2021-06-29 ENCOUNTER — Inpatient Hospital Stay (HOSPITAL_BASED_OUTPATIENT_CLINIC_OR_DEPARTMENT_OTHER): Payer: Medicare PPO | Admitting: Oncology

## 2021-06-29 ENCOUNTER — Inpatient Hospital Stay: Payer: Medicare PPO

## 2021-06-29 ENCOUNTER — Encounter: Payer: Self-pay | Admitting: Oncology

## 2021-06-29 ENCOUNTER — Other Ambulatory Visit: Payer: Self-pay

## 2021-06-29 ENCOUNTER — Telehealth: Payer: Self-pay

## 2021-06-29 VITALS — BP 149/89 | HR 108 | Temp 97.4°F | Resp 18 | Wt 157.7 lb

## 2021-06-29 DIAGNOSIS — C641 Malignant neoplasm of right kidney, except renal pelvis: Secondary | ICD-10-CM | POA: Diagnosis present

## 2021-06-29 DIAGNOSIS — F32A Depression, unspecified: Secondary | ICD-10-CM | POA: Insufficient documentation

## 2021-06-29 DIAGNOSIS — Z794 Long term (current) use of insulin: Secondary | ICD-10-CM | POA: Diagnosis not present

## 2021-06-29 DIAGNOSIS — R739 Hyperglycemia, unspecified: Secondary | ICD-10-CM

## 2021-06-29 DIAGNOSIS — E1165 Type 2 diabetes mellitus with hyperglycemia: Secondary | ICD-10-CM | POA: Insufficient documentation

## 2021-06-29 DIAGNOSIS — R918 Other nonspecific abnormal finding of lung field: Secondary | ICD-10-CM | POA: Insufficient documentation

## 2021-06-29 DIAGNOSIS — Z79899 Other long term (current) drug therapy: Secondary | ICD-10-CM | POA: Diagnosis not present

## 2021-06-29 DIAGNOSIS — Z5112 Encounter for antineoplastic immunotherapy: Secondary | ICD-10-CM

## 2021-06-29 LAB — CBC WITH DIFFERENTIAL/PLATELET
Abs Immature Granulocytes: 0.04 10*3/uL (ref 0.00–0.07)
Basophils Absolute: 0 10*3/uL (ref 0.0–0.1)
Basophils Relative: 1 %
Eosinophils Absolute: 0.1 10*3/uL (ref 0.0–0.5)
Eosinophils Relative: 2 %
HCT: 45 % (ref 36.0–46.0)
Hemoglobin: 14.3 g/dL (ref 12.0–15.0)
Immature Granulocytes: 1 %
Lymphocytes Relative: 23 %
Lymphs Abs: 1.4 10*3/uL (ref 0.7–4.0)
MCH: 26.2 pg (ref 26.0–34.0)
MCHC: 31.8 g/dL (ref 30.0–36.0)
MCV: 82.4 fL (ref 80.0–100.0)
Monocytes Absolute: 0.4 10*3/uL (ref 0.1–1.0)
Monocytes Relative: 6 %
Neutro Abs: 4 10*3/uL (ref 1.7–7.7)
Neutrophils Relative %: 67 %
Platelets: 217 10*3/uL (ref 150–400)
RBC: 5.46 MIL/uL — ABNORMAL HIGH (ref 3.87–5.11)
RDW: 14.6 % (ref 11.5–15.5)
WBC: 5.9 10*3/uL (ref 4.0–10.5)
nRBC: 0 % (ref 0.0–0.2)

## 2021-06-29 LAB — COMPREHENSIVE METABOLIC PANEL
ALT: 12 U/L (ref 0–44)
AST: 22 U/L (ref 15–41)
Albumin: 3.9 g/dL (ref 3.5–5.0)
Alkaline Phosphatase: 98 U/L (ref 38–126)
Anion gap: 10 (ref 5–15)
BUN: 9 mg/dL (ref 8–23)
CO2: 24 mmol/L (ref 22–32)
Calcium: 10.2 mg/dL (ref 8.9–10.3)
Chloride: 99 mmol/L (ref 98–111)
Creatinine, Ser: 0.54 mg/dL (ref 0.44–1.00)
GFR, Estimated: >60 mL/min (ref >60)
Glucose, Bld: 431 mg/dL — ABNORMAL HIGH (ref 70–99)
Potassium: 3.6 mmol/L (ref 3.5–5.1)
Sodium: 133 mmol/L — ABNORMAL LOW (ref 135–145)
Total Bilirubin: 0.9 mg/dL (ref 0.3–1.2)
Total Protein: 6.9 g/dL (ref 6.5–8.1)

## 2021-06-29 LAB — T4, FREE: Free T4: 0.85 ng/dL (ref 0.61–1.12)

## 2021-06-29 LAB — TSH: TSH: 2.699 u[IU]/mL (ref 0.350–4.500)

## 2021-06-29 MED ORDER — SODIUM CHLORIDE 0.9 % IV SOLN
INTRAVENOUS | Status: DC
  Filled 2021-06-29 (×2): qty 250

## 2021-06-29 NOTE — Progress Notes (Signed)
Pt here for follow up. No new concerns voiced. Med rec done with medication bottles.

## 2021-06-29 NOTE — Telephone Encounter (Signed)
-  Called and informed patients PCP at [phone] of Dr. Collie Siad request to speak with patient PCP. Verbalized giving pcp message and will be contacting Dr. Tasia Catchings.

## 2021-06-29 NOTE — Progress Notes (Addendum)
Hematology/Oncology progress  note [facility] Telephone:([phone] [phone]   Patient Care Team: Sheral Apley as PCP - Josem Kaufmann, MD as Consulting Physician (Hematology and Oncology)  REFERRING PROVIDER: No ref. provider found  CHIEF COMPLAINTS/REASON FOR VISIT:  Metastatic RCC  HISTORY OF PRESENTING ILLNESS:   Patient is a poor historian. Her mood appears to be depressed.  She currently lives at assisted living facility. Moved from [place]. Widowed 3 years ago.  Her power of attorney is [name] who is her husband's niece. [name] was called during this encounter.   Extensive medical record review was performed. She has had multiple images done during the past few months, 05/27/20 CT head wo contrast- no acute intracranial abnormality.  06/01/20 CT abdomen and pelvis with contrast 4 mm proximal left ureteral calculus causing mild proximal left- sided hydroureteronephrosis with periureteric and perinephric stranding on the left.5.2cm enhancing right mid anterior kidney mass. Mildly enlarged right retroperitoneal retrocaval lymph nodes suspicious for nodal metastases Mild cardiomegaly, Small hiatal hernia. Status post gastric bypass surgery and cholecystectomy. Postoperative changes anterior abdominal wall. 06/05/20 CT head wo contrast - No acute intracranial abnormality. Probable tiny right frontal scalp Contusion. 2. Mild global volume loss and minimal chronic ischemic small vessel disease. 06/06/20 CT abdomen pelvis wo contrast 1. Indeterminate lesion in the right kidney which is stable may be a solid mass., 2. Partially calcified uterine fibroids. 3. Left UVJ calculus measuring 5 mm 06/07/20 CT head wo contrast.  1. No acute intracranial abnormality. No significant change. 2. Intracranial atherosclerosis, atrophy and chronic small vessel ischemic changes. 3. Chronic/subacute right maxillary sinus disease.  06/07/20 Cystoscopy by  [name] for Ureteroscopic stone extraction Admitted on 10/3/21due to failure to thrive,  anorexia, dehydration, electrolyte imbalances.   06/09/20 CT abdomen and chest with contrast 1. Right renal mass is stable in size. Most suggestive of a renal neoplasm. 2. Right retroperitoneal lymph nodes are stable in size; may well be metastatic. 3. Left hydronephrosis and hydroureter is stable. Pelvis is not imaged therefore if there is a distal calculus still present, it is not visible. 4. Sclerotic lesions in the anterior left seventh, eighth and ninth ribs which given distribution are most likely healed rib fractures.  She is on Eliquis 10m Bid for atrial fibrillation. She reports not taking anti depressants that were given by her PCP.  #11/04/2020, PET scan was obtained. PET scan showed interval increasing size of right upper lobe pulmonary lesion, now 3 cm with an SUV of 3.8.  Associated with a new right lower lobe nodule 8 mm.  5.1 cm right renal mass hypermetabolic activity.  1.9 retrocaval lymph node with SUV of 4.8.  Second index retrocaval node with SUV of 2.  Nodular disease seen in the duodenum on previous study is not well demonstrated on noncontrast CT image today.  Some nodular FDG activity, nonspecific. Multiple anterior abdominal soft tissue nodules are evident.  13 mm anterior left nodule shows no substantial hypermetabolic with an SUV of 1.7.  Calcified left lateral nodule with an SUV of 2.9.  2.2 cm calcified nodule lobe right paracolic gutter with an SUV of 2.5.  12/17/2020, right kidney mass biopsy pathology was reviewed and discussed with patient.  Pathology is consistent with RCC-demonstrates the morphology spectrum, papillary, solid and nested areas.  CD10 positive, racemase positive, CK7 negative.  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  TFE 3 break apart FISH testing is pending.  #01/05/2021, addendum  pathology TFE3 break-apart FISH testing was  performed and a translocation  involving Xp11 / TFE3 transcription gene was not detected. HMB-45 IHC is negative  CAIX IHC demonstrates membranous staining.  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  MRI brain was obtained during the admission and was negative for acute process.  Patient mental status improved.  UTI was treated with IV antibiotics and switched to p.o. antibiotic upon discharge.  Patient also was found to have rapid ventricular response with her atrial fibrillation.  Rate was treated with Cardizem drip during hospitalization because and switched to Toprol and Cardizem for rate control.  Eliquis for anticoagulation. Patient's axitinib was held during hospitalization. There was plan for patient to restart axitinib upon discharge.  However since patient was sent to rehab, he is not able to be started on axitinib due to lack of access to her medication supply.  [facility]  Patient presents for post hospitalization follow-up.  She does not have much recollection of her recent admission.  Reports feeling well today.  He denies any fever, chills, nausea vomiting diarrhea.  03/07/2021, CT abdomen pelvis  Right renal mass slightly enlarged since 10/03/2020.  5.4 x 4.9 x 6.4 comparing to previously 5.0 x 4.4 x 6.3.  Stable adjacent right retroperitoneal lymphadenopathy.  There is a new 1.5 cm nodule in the right lower lobe.  Mild stranding around the right renal mass.  Hospitalized between 03/06/2021-03/16/2021 due to acute delirium from UTI.  [facility] and [place]  INTERVAL HISTORY LyMeilyn Heindls a [id number].o.  female who has above history reviewed by me today presents for follow up visit for management of right renal clear-cell carcinoma Patient reports that she is not taking her medication.  She reports sleeping a lot, not waking up medication.  Breakfast as well.  Patient has had CT scanning done during interval.     Review of Systems  Constitutional:  Positive for fatigue. Negative for appetite change, chills and fever.  HENT:   Negative for hearing loss and voice change.   Eyes:  Negative for eye problems.  Respiratory:  Negative for chest tightness and cough.   Cardiovascular:  Negative for chest pain.  Gastrointestinal:  Negative for abdominal distention, abdominal pain, blood in stool and constipation.  Endocrine: Negative for hot flashes.  Genitourinary:  Negative for difficulty urinating and frequency.   Musculoskeletal:  Negative for arthralgias.  Skin:  Negative for itching and rash.  Neurological:  Negative for extremity weakness.  Hematological:  Negative for adenopathy.  Psychiatric/Behavioral:  Positive for depression. Negative for confusion.    MEDICAL HISTORY:  Past Medical History:  Diagnosis Date   [place]-fib ([place]   Depression    Diabetes mellitus without complication ([facility]   History of kidney stones    Kidney mass     SURGICAL HISTORY: Past Surgical History:  Procedure Laterality Date   GASTRIC BYPASS     HERNIA REPAIR     TONSILLECTOMY     URETEROSCOPY WITH HOLMIUM LASER LITHOTRIPSY Left     SOCIAL HISTORY: Social History   Socioeconomic History   Marital status: Widowed  Spouse name: Not on file   Number of children: Not on file   Years of education: Not on file   Highest education level: Not on file  Occupational History   Not on file  Tobacco Use   Smoking status: Never   Smokeless tobacco: Never  Vaping Use   Vaping Use: Never used  Substance and Sexual Activity   Alcohol use: Not Currently   Drug use: Not Currently   Sexual activity: Not  Currently  Other Topics Concern   Not on file  Social History Narrative   Not on file   Social Determinants of Health   Financial Resource Strain: Not on file  Food Insecurity: Not on file  Transportation Needs: Not on file  Physical Activity: Not on file  Stress: Not on file  Social Connections: Not on file  Intimate Partner Violence: Not on file    FAMILY HISTORY: Family History  Problem Relation Age of Onset   Breast cancer Mother    Bladder Cancer Neg Hx    Kidney cancer Neg Hx    Prostate cancer Neg Hx     ALLERGIES:  is allergic to kiwi extract.  MEDICATIONS:  Current Outpatient Medications  Medication Sig Dispense Refill   axitinib (INLYTA) 5 MG tablet Take 1 tablet (5 mg total) by mouth 2 (two) times daily. 60 tablet 0   cetirizine (ZYRTEC) 10 MG tablet Take 10 mg by mouth daily.     ELIQUIS 5 MG TABS tablet Take 5 mg by mouth 2 (two) times daily.     escitalopram (LEXAPRO) 10 MG tablet Take 10 mg by mouth daily.     metFORMIN (GLUCOPHAGE-XR) 500 MG 24 hr tablet SMARTSIG:4 Tablet(s) By Mouth Every Evening     metoprolol succinate (TOPROL-XL) 50 MG 24 hr tablet Take 50 mg by mouth daily. Take with or immediately following a meal.     acetaminophen (TYLENOL) 650 MG CR tablet Take 650 mg by mouth every 4 (four) hours as needed for pain or fever. (Patient not taking: Reported on 06/03/2021)     cyanocobalamin (,VITAMIN B-12,) 1000 MCG/ML injection Inject into the muscle. (Patient not taking: No sig reported)     diclofenac Sodium (VOLTAREN) 1 % GEL Apply 4 g topically 4 (four) times daily as needed (pain). (Patient not taking: No sig reported) 150 g 0   diltiazem (CARDIZEM CD) 180 MG 24 hr capsule Take 1 capsule (180 mg total) by mouth every evening. (Patient not taking: Reported on 06/29/2021) 30 capsule 0   feeding supplement, GLUCERNA SHAKE, (GLUCERNA SHAKE) LIQD Take 237 mLs by mouth 3 (three) times daily between meals. (Patient not taking: Reported on 06/29/2021)  21330 mL 0   insulin aspart (NOVOLOG) 100 UNIT/ML injection Inject 4 Units into the skin 3 (three) times daily with meals. (Patient not taking: No sig reported) 10 mL 0   insulin glargine (LANTUS) 100 UNIT/ML injection Inject 27 Units into the skin 2 (two) times daily. 27 u in the morning and 27 u at night (Patient not taking: No sig reported)     Lactobacillus (PROBIOTIC ACIDOPHILUS PO) Take by mouth. (Patient not taking: Reported on 06/29/2021)     lactulose (CHRONULAC) 10 GM/15ML solution Take 30 mLs (20 g total) by mouth daily. (Patient not taking: Reported on 06/29/2021) 946 mL 0   nystatin (MYCOSTATIN/NYSTOP) powder Apply topically 3 (three) times daily. (Patient not taking: No sig reported) 15 g 0   Pollen Extracts (PROSTAT PO) Take by mouth. (Patient not  taking: No sig reported)     Psyllium 48.57 % POWD Take by mouth. Takes 1 QD prn (Patient not taking: No sig reported)     No current facility-administered medications for this visit.   Facility-Administered Medications Ordered in Other Visits  Medication Dose Route Frequency Provider Last Rate Last Admin   0.9 %  sodium chloride infusion   Intravenous Continuous [name], MD   Stopped at 06/29/21 1600     PHYSICAL EXAMINATION: ECOG PERFORMANCE STATUS: 2 - Symptomatic, <50% confined to bed Vitals:   06/29/21 1423  BP: (!) 149/89  Pulse: (!) 108  Resp: 18  Temp: (!) 97.4 F (36.3 C)  SpO2: 96%   Filed Weights   06/29/21 1423  Weight: 157 lb 11.2 oz (71.5 kg)    Physical Exam Constitutional:      General: She is not in acute distress.    Comments: Frail elderly female walks with a walker.   HENT:     Head: Normocephalic and atraumatic.  Eyes:     General: No scleral icterus. Cardiovascular:     Rate and Rhythm: Normal rate. Rhythm irregular.     Heart sounds: Normal heart sounds.  Pulmonary:     Effort: Pulmonary effort is normal. No respiratory distress.     Breath sounds: No wheezing.  Abdominal:     General:  Bowel sounds are normal. There is no distension.     Palpations: Abdomen is soft.  Musculoskeletal:        General: No deformity. Normal range of motion.     Cervical back: Normal range of motion and neck supple.  Skin:    General: Skin is warm and dry.     Findings: No erythema or rash.  Neurological:     Mental Status: She is alert and oriented to person, place, and time.     Cranial Nerves: No cranial nerve deficit.     Coordination: Coordination normal.  Psychiatric:     Comments: Depressed    LABORATORY DATA:  I have reviewed the data as listed Lab Results  Component Value Date   WBC 5.9 06/29/2021   HGB 14.3 06/29/2021   HCT 45.0 06/29/2021   MCV 82.4 06/29/2021   PLT 217 06/29/2021   Recent Labs    05/06/21 0950 06/01/21 1003 06/29/21 1352  NA 132* 133* 133*  K 4.2 3.6 3.6  CL 97* 94* 99  CO2 _0 GLUCOSE 466* 378* 431*  BUN _1 CREATININE 0.68 0.66 0.54  CALCIUM 10.3 10.3 10.2  GFRNONAA >60 >60 >60  PROT 7.3 7.0 6.9  ALBUMIN 3.8 3.8 3.9  AST _2 ALT _3 ALKPHOS 101 101 98  BILITOT 0.8 0.8 0.9    Iron/TIBC/Ferritin/ %Sat No results found for: IRON, TIBC, FERRITIN, IRONPCTSAT    RADIOGRAPHIC STUDIES: I have personally reviewed the radiological images as listed and agreed with the findings in the report. CT CHEST ABDOMEN PELVIS W CONTRAST  Result Date: 06/24/2021 CLINICAL DATA:  Restaging metastatic renal cell carcinoma. EXAM: CT CHEST, ABDOMEN, AND PELVIS WITH CONTRAST TECHNIQUE: Multidetector CT imaging of the chest, abdomen and pelvis was performed following the standard protocol during bolus administration of intravenous contrast. CONTRAST:  140m OMNIPAQUE IOHEXOL 300 MG/ML  SOLN COMPARISON:  CT abdomen/pelvis 03/07/2021 and PET-CT 11/22/2020 FINDINGS: CT CHEST FINDINGS Cardiovascular: The heart is normal in size. No pericardial effusion. Mild tortuosity and calcification of the thoracic aorta and stable scattered coronary  artery  calcifications. Mild enlargement of the pulmonary arterial trunk suggesting pulmonary hypertension. Mediastinum/Nodes: Small scattered mediastinal and hilar lymph nodes but no mass or overt adenopathy. The esophagus is grossly normal. Lungs/Pleura: The right lower lobe pulmonary nodules noted on the recent abdominal/pelvic CT scan in July have resolved. There is a new subpleural nodule in the right middle lobe medially measuring 14 mm on image 56/4. There is also a new 10 mm subpleural nodule in the right upper lobe on image number 46/4. On prior CT and PET-CT scans the hypermetabolic right upper lobe lesion anteriorly has completely resolved. No worrisome left-sided pulmonary lesions or nodules. Musculoskeletal: No breast masses, supraclavicular or axillary adenopathy. The bony thorax is intact. CT ABDOMEN PELVIS FINDINGS Hepatobiliary: No hepatic lesions to suggest hepatic metastatic disease. Gallbladder is surgically absent. No common bile duct dilatation. Pancreas: Fatty atrophy of the pancreas but no mass or inflammation. Spleen: Normal size.  No focal lesions. Adrenals/Urinary Tract: The adrenal glands are unremarkable. The left kidney is normal and unchanged. Stable large right anterior renal mass abutting the right hepatic lobe of the liver. This measures a maximum of approximately 5.4 x 4.8 cm and is unchanged since the abdominal CT scan from July. Right-sided retroperitoneal adenopathy the is again demonstrated. The largest node on image number 53/2 measures 22.5 x 20 mm and previously measured 23 x 20 mm. More inferiorly on image number 66/2 there is an 8 mm lymph node which previously measured 6.5 mm. No new adenopathy. Stomach/Bowel: Stable surgical changes from gastric bypass surgery. Small bowel and colon are grossly normal. Vascular/Lymphatic: The aorta and branch vessels are stable. The major venous structures are unremarkable. Overall stable right-sided retroperitoneal adenopathy as detailed above.  Reproductive: Stable calcified uterine fibroids. The ovaries are unremarkable. Other: No pelvic mass or adenopathy. No free pelvic fluid collections. No inguinal mass or adenopathy. No abdominal wall hernia or subcutaneous lesions. Musculoskeletal: No significant bony findings. IMPRESSION: 1. Stable large right anterior renal mass. 2. Stable to slightly larger right-sided retroperitoneal adenopathy. 3. Waxing and waning pulmonary lesions. Two lower lobe nodules have resolved. There are new right middle lobe and right upper lobe nodules. Aortic Atherosclerosis (ICD10-I70.0). Electronically Signed   By: Marijo Sanes M.D.   On: 06/24/2021 16:09      ASSESSMENT & PLAN:  1. Renal cell carcinoma of right kidney ([place])   2. Depression, unspecified depression type   3. Encounter for antineoplastic immunotherapy   4. Hyperglycemia    #Right kidney mass with retroperitoneal lymphadenopathy, lung mass and peritoneal nodularity 06/23/2021, CT chest abdomen pelvis showed stable large right anterior renal mass.  Stable to slightly larger right-sided retroperitoneal adenopathy.  Waxing and waning pulmonary nodules.  2 lower lobe nodules resolved.  New right middle lobe and right upper lobe nodules. Labs reviewed and discussed with patient.  Hold chemotherapy due to hyperglycemia  #Diabetes, uncontrolled.  Blood sugar was 431, due to noncompliance.  Recommend patient to go to emergency room for evaluation and she declines. I have #Severe depression She has not establish care with psychiatry. I reached out to patient's primary care provider's office requesting a call back to further discuss this patient's plan  All questions were answered. The patient knows to call the clinic with any problems questions or concerns.  Follow up to be determined.  [name], MD, PhD Hematology Oncology [place] at [facility]  06/29/2021  Addendum  I have talked to patient's PCP Olivia Mackie over the phone  and  discussed patient's case with her. Patient is not complaint with her medication. Her diabetes was poorly controlled and glucose was extremely high, she refused to go to ER for management. She is also extremely depressed. Most recent CT scan shows stable disease. We both agree that patient is not able to take care of herself.  Olivia Mackie will try to get her to see psychiatrist for evaluation of her depression,she may benefit from living in a facility as well. From Oncology aspect, her prognosis is poor, and I will hold off additional treatment until her other acute medical conditions are stabilized. Hospice/palliative care is reasonable if her condition continues to deteriorate. Olivia Mackie will update me if she improves. [name]

## 2021-07-01 ENCOUNTER — Other Ambulatory Visit (HOSPITAL_COMMUNITY): Payer: Self-pay

## 2021-07-01 ENCOUNTER — Encounter: Payer: Self-pay | Admitting: Oncology

## 2021-07-23 ENCOUNTER — Observation Stay
Admission: EM | Admit: 2021-07-23 | Discharge: 2021-07-31 | Disposition: A | Discharge status: Skilled Nursing Facility | Payer: Medicare PPO | Attending: Student in an Organized Health Care Education/Training Program | Admitting: Student in an Organized Health Care Education/Training Program

## 2021-07-23 ENCOUNTER — Other Ambulatory Visit: Payer: Self-pay

## 2021-07-23 ENCOUNTER — Other Ambulatory Visit (HOSPITAL_COMMUNITY): Payer: Self-pay

## 2021-07-23 ENCOUNTER — Emergency Department: Payer: Medicare PPO

## 2021-07-23 DIAGNOSIS — F32A Depression, unspecified: Secondary | ICD-10-CM | POA: Diagnosis present

## 2021-07-23 DIAGNOSIS — R7989 Other specified abnormal findings of blood chemistry: Secondary | ICD-10-CM | POA: Diagnosis present

## 2021-07-23 DIAGNOSIS — F329 Major depressive disorder, single episode, unspecified: Secondary | ICD-10-CM | POA: Diagnosis not present

## 2021-07-23 DIAGNOSIS — C649 Malignant neoplasm of unspecified kidney, except renal pelvis: Secondary | ICD-10-CM | POA: Insufficient documentation

## 2021-07-23 DIAGNOSIS — K5900 Constipation, unspecified: Secondary | ICD-10-CM | POA: Insufficient documentation

## 2021-07-23 DIAGNOSIS — Z9181 History of falling: Secondary | ICD-10-CM

## 2021-07-23 DIAGNOSIS — F05 Delirium due to known physiological condition: Secondary | ICD-10-CM | POA: Diagnosis present

## 2021-07-23 DIAGNOSIS — Z79899 Other long term (current) drug therapy: Secondary | ICD-10-CM | POA: Insufficient documentation

## 2021-07-23 DIAGNOSIS — I4891 Unspecified atrial fibrillation: Secondary | ICD-10-CM | POA: Diagnosis not present

## 2021-07-23 DIAGNOSIS — Z7901 Long term (current) use of anticoagulants: Secondary | ICD-10-CM | POA: Insufficient documentation

## 2021-07-23 DIAGNOSIS — W1830XA Fall on same level, unspecified, initial encounter: Secondary | ICD-10-CM | POA: Insufficient documentation

## 2021-07-23 DIAGNOSIS — L304 Erythema intertrigo: Secondary | ICD-10-CM | POA: Insufficient documentation

## 2021-07-23 DIAGNOSIS — Z7984 Long term (current) use of oral hypoglycemic drugs: Secondary | ICD-10-CM

## 2021-07-23 DIAGNOSIS — Z91138 Patient's unintentional underdosing of medication regimen for other reason: Secondary | ICD-10-CM

## 2021-07-23 DIAGNOSIS — E860 Dehydration: Secondary | ICD-10-CM | POA: Diagnosis present

## 2021-07-23 DIAGNOSIS — R531 Weakness: Secondary | ICD-10-CM | POA: Diagnosis not present

## 2021-07-23 DIAGNOSIS — W19XXXA Unspecified fall, initial encounter: Secondary | ICD-10-CM | POA: Diagnosis not present

## 2021-07-23 DIAGNOSIS — Z85528 Personal history of other malignant neoplasm of kidney: Secondary | ICD-10-CM

## 2021-07-23 DIAGNOSIS — Z794 Long term (current) use of insulin: Secondary | ICD-10-CM | POA: Insufficient documentation

## 2021-07-23 DIAGNOSIS — I10 Essential (primary) hypertension: Secondary | ICD-10-CM | POA: Diagnosis present

## 2021-07-23 DIAGNOSIS — R911 Solitary pulmonary nodule: Secondary | ICD-10-CM | POA: Diagnosis not present

## 2021-07-23 DIAGNOSIS — E119 Type 2 diabetes mellitus without complications: Secondary | ICD-10-CM | POA: Insufficient documentation

## 2021-07-23 DIAGNOSIS — T461X6A Underdosing of calcium-channel blockers, initial encounter: Secondary | ICD-10-CM | POA: Diagnosis present

## 2021-07-23 DIAGNOSIS — R296 Repeated falls: Secondary | ICD-10-CM | POA: Diagnosis not present

## 2021-07-23 DIAGNOSIS — Z803 Family history of malignant neoplasm of breast: Secondary | ICD-10-CM

## 2021-07-23 DIAGNOSIS — R9431 Abnormal electrocardiogram [ECG] [EKG]: Secondary | ICD-10-CM | POA: Diagnosis not present

## 2021-07-23 DIAGNOSIS — Z20822 Contact with and (suspected) exposure to covid-19: Secondary | ICD-10-CM | POA: Insufficient documentation

## 2021-07-23 DIAGNOSIS — G3184 Mild cognitive impairment, so stated: Secondary | ICD-10-CM | POA: Insufficient documentation

## 2021-07-23 DIAGNOSIS — F0393 Unspecified dementia, unspecified severity, with mood disturbance: Secondary | ICD-10-CM | POA: Diagnosis present

## 2021-07-23 DIAGNOSIS — C641 Malignant neoplasm of right kidney, except renal pelvis: Secondary | ICD-10-CM | POA: Diagnosis present

## 2021-07-23 DIAGNOSIS — Z9884 Bariatric surgery status: Secondary | ICD-10-CM

## 2021-07-23 DIAGNOSIS — E876 Hypokalemia: Secondary | ICD-10-CM | POA: Diagnosis not present

## 2021-07-23 DIAGNOSIS — T447X6A Underdosing of beta-adrenoreceptor antagonists, initial encounter: Secondary | ICD-10-CM | POA: Diagnosis present

## 2021-07-23 DIAGNOSIS — I480 Paroxysmal atrial fibrillation: Secondary | ICD-10-CM | POA: Diagnosis present

## 2021-07-23 DIAGNOSIS — R4189 Other symptoms and signs involving cognitive functions and awareness: Secondary | ICD-10-CM | POA: Diagnosis present

## 2021-07-23 DIAGNOSIS — Z87442 Personal history of urinary calculi: Secondary | ICD-10-CM

## 2021-07-23 DIAGNOSIS — Y92009 Unspecified place in unspecified non-institutional (private) residence as the place of occurrence of the external cause: Secondary | ICD-10-CM

## 2021-07-23 DIAGNOSIS — E872 Acidosis, unspecified: Secondary | ICD-10-CM | POA: Diagnosis present

## 2021-07-23 DIAGNOSIS — E1165 Type 2 diabetes mellitus with hyperglycemia: Secondary | ICD-10-CM | POA: Diagnosis present

## 2021-07-23 LAB — CBG MONITORING, ED
Glucose-Capillary: 296 mg/dL — ABNORMAL HIGH (ref 70–99)
Glucose-Capillary: 271 mg/dL — ABNORMAL HIGH (ref 70–99)

## 2021-07-23 LAB — COMPREHENSIVE METABOLIC PANEL
ALT: 12 U/L (ref 0–44)
AST: 20 U/L (ref 15–41)
Albumin: 3.7 g/dL (ref 3.5–5.0)
Alkaline Phosphatase: 95 U/L (ref 38–126)
Anion gap: 11 (ref 5–15)
BUN: 12 mg/dL (ref 8–23)
CO2: 25 mmol/L (ref 22–32)
Calcium: 10.3 mg/dL (ref 8.9–10.3)
Chloride: 100 mmol/L (ref 98–111)
Creatinine, Ser: 0.59 mg/dL (ref 0.44–1.00)
GFR, Estimated: >60 mL/min (ref >60)
Glucose, Bld: 291 mg/dL — ABNORMAL HIGH (ref 70–99)
Potassium: 4.1 mmol/L (ref 3.5–5.1)
Sodium: 136 mmol/L (ref 135–145)
Total Bilirubin: 1.8 mg/dL — ABNORMAL HIGH (ref 0.3–1.2)
Total Protein: 7.1 g/dL (ref 6.5–8.1)

## 2021-07-23 LAB — CBC WITH DIFFERENTIAL/PLATELET
Abs Immature Granulocytes: 0.02 10*3/uL (ref 0.00–0.07)
Basophils Absolute: 0 10*3/uL (ref 0.0–0.1)
Basophils Relative: 0 %
Eosinophils Absolute: 0 10*3/uL (ref 0.0–0.5)
Eosinophils Relative: 0 %
HCT: 48.4 % — ABNORMAL HIGH (ref 36.0–46.0)
Hemoglobin: 15.4 g/dL — ABNORMAL HIGH (ref 12.0–15.0)
Immature Granulocytes: 0 %
Lymphocytes Relative: 16 %
Lymphs Abs: 1.1 10*3/uL (ref 0.7–4.0)
MCH: 26.4 pg (ref 26.0–34.0)
MCHC: 31.8 g/dL (ref 30.0–36.0)
MCV: 83 fL (ref 80.0–100.0)
Monocytes Absolute: 0.5 10*3/uL (ref 0.1–1.0)
Monocytes Relative: 8 %
Neutro Abs: 5 10*3/uL (ref 1.7–7.7)
Neutrophils Relative %: 76 %
Platelets: 173 10*3/uL (ref 150–400)
RBC: 5.83 MIL/uL — ABNORMAL HIGH (ref 3.87–5.11)
RDW: 14.3 % (ref 11.5–15.5)
WBC: 6.7 10*3/uL (ref 4.0–10.5)
nRBC: 0 % (ref 0.0–0.2)

## 2021-07-23 LAB — URINALYSIS, ROUTINE W REFLEX MICROSCOPIC
Bilirubin Urine: NEGATIVE
Glucose, UA: >=500 mg/dL — AB
Hgb urine dipstick: NEGATIVE
Ketones, ur: 80 mg/dL — AB
Leukocytes,Ua: NEGATIVE
Nitrite: NEGATIVE
Protein, ur: NEGATIVE mg/dL
Specific Gravity, Urine: 1.032 — ABNORMAL HIGH (ref 1.005–1.030)
pH: 5 (ref 5.0–8.0)

## 2021-07-23 LAB — CK: Total CK: 39 U/L (ref 38–234)

## 2021-07-23 LAB — PROCALCITONIN: Procalcitonin: <0.1 ng/mL

## 2021-07-23 LAB — LACTIC ACID, PLASMA
Lactic Acid, Venous: 2.4 mmol/L (ref 0.5–1.9)
Lactic Acid, Venous: 2.1 mmol/L (ref 0.5–1.9)
Lactic Acid, Venous: 1.8 mmol/L (ref 0.5–1.9)

## 2021-07-23 LAB — RESP PANEL BY RT-PCR (FLU A&B, COVID) ARPGX2
Influenza A by PCR: NEGATIVE
Influenza B by PCR: NEGATIVE
SARS Coronavirus 2 by RT PCR: NEGATIVE

## 2021-07-23 LAB — TROPONIN I (HIGH SENSITIVITY): Troponin I (High Sensitivity): 9 ng/L (ref <18)

## 2021-07-23 LAB — BRAIN NATRIURETIC PEPTIDE: B Natriuretic Peptide: 164.6 pg/mL — ABNORMAL HIGH (ref 0.0–100.0)

## 2021-07-23 MED ORDER — INSULIN GLARGINE-YFGN 100 UNIT/ML ~~LOC~~ SOLN
15.0000 [IU] | Freq: Two times a day (BID) | SUBCUTANEOUS | Status: DC
  Administered 2021-07-23: 19:00:00 15 [IU] via SUBCUTANEOUS
  Filled 2021-07-23 (×3): qty 0.15

## 2021-07-23 MED ORDER — INSULIN ASPART 100 UNIT/ML IJ SOLN
0.0000 [IU] | Freq: Three times a day (TID) | INTRAMUSCULAR | Status: DC
Start: 2021-07-23 — End: 2021-07-31
  Administered 2021-07-23: 18:00:00 5 [IU] via SUBCUTANEOUS
  Administered 2021-07-24: 3 [IU] via SUBCUTANEOUS
  Administered 2021-07-24: 5 [IU] via SUBCUTANEOUS
  Administered 2021-07-24: 2 [IU] via SUBCUTANEOUS
  Administered 2021-07-25: 5 [IU] via SUBCUTANEOUS
  Administered 2021-07-25 – 2021-07-27 (×7): 3 [IU] via SUBCUTANEOUS
  Administered 2021-07-27: 2 [IU] via SUBCUTANEOUS
  Administered 2021-07-28: 3 [IU] via SUBCUTANEOUS
  Administered 2021-07-28: 5 [IU] via SUBCUTANEOUS
  Administered 2021-07-28: 2 [IU] via SUBCUTANEOUS
  Administered 2021-07-29 (×2): 5 [IU] via SUBCUTANEOUS
  Administered 2021-07-29: 2 [IU] via SUBCUTANEOUS
  Administered 2021-07-30: 9 [IU] via SUBCUTANEOUS
  Administered 2021-07-30: 2 [IU] via SUBCUTANEOUS
  Administered 2021-07-31: 5 [IU] via SUBCUTANEOUS
  Filled 2021-07-23 (×22): qty 1

## 2021-07-23 MED ORDER — DILTIAZEM HCL ER COATED BEADS 180 MG PO CP24: 180.0000 mg | ORAL_CAPSULE | Freq: Every evening | ORAL | Status: DC

## 2021-07-23 MED ORDER — DILTIAZEM HCL ER COATED BEADS 180 MG PO CP24
180.0000 mg | ORAL_CAPSULE | Freq: Every evening | ORAL | Status: DC
  Administered 2021-07-23 – 2021-07-24 (×2): 180 mg via ORAL
  Filled 2021-07-23 (×2): qty 1

## 2021-07-23 MED ORDER — SODIUM CHLORIDE 0.9 % IV BOLUS
1000.0000 mL | Freq: Once | INTRAVENOUS | Status: AC
  Administered 2021-07-23: 13:00:00 1000 mL via INTRAVENOUS

## 2021-07-23 MED ORDER — APIXABAN 5 MG PO TABS
5.0000 mg | ORAL_TABLET | Freq: Two times a day (BID) | ORAL | Status: DC
  Administered 2021-07-23 – 2021-07-31 (×16): 5 mg via ORAL
  Filled 2021-07-23 (×16): qty 1

## 2021-07-23 MED ORDER — MICONAZOLE NITRATE POWD
Freq: Two times a day (BID) | Status: DC
  Filled 2021-07-23: qty 100

## 2021-07-23 MED ORDER — SODIUM CHLORIDE 0.9 % IV SOLN
1.0000 g | Freq: Once | INTRAVENOUS | Status: AC
  Administered 2021-07-23: 14:00:00 1 g via INTRAVENOUS
  Filled 2021-07-23: qty 10

## 2021-07-23 MED ORDER — DIPHENHYDRAMINE HCL 50 MG/ML IJ SOLN: 12.5000 mg | Freq: Three times a day (TID) | INTRAMUSCULAR | Status: DC | PRN

## 2021-07-23 MED ORDER — METOPROLOL TARTRATE 5 MG/5ML IV SOLN
2.5000 mg | INTRAVENOUS | Status: DC | PRN
  Administered 2021-07-23 – 2021-07-25 (×4): 2.5 mg via INTRAVENOUS
  Filled 2021-07-23 (×4): qty 5

## 2021-07-23 MED ORDER — LORATADINE 10 MG PO TABS
10.0000 mg | ORAL_TABLET | Freq: Every day | ORAL | Status: DC
  Administered 2021-07-23 – 2021-07-29 (×7): 10 mg via ORAL
  Filled 2021-07-23 (×7): qty 1

## 2021-07-23 MED ORDER — INSULIN ASPART 100 UNIT/ML IJ SOLN
0.0000 [IU] | Freq: Every day | INTRAMUSCULAR | Status: DC
Start: 2021-07-23 — End: 2021-07-30
  Administered 2021-07-23: 20:00:00 3 [IU] via SUBCUTANEOUS
  Administered 2021-07-24: 2 [IU] via SUBCUTANEOUS
  Administered 2021-07-27: 3 [IU] via SUBCUTANEOUS
  Filled 2021-07-23 (×3): qty 1

## 2021-07-23 MED ORDER — SODIUM CHLORIDE 0.9 % IV SOLN
INTRAVENOUS | Status: DC
  Administered 2021-07-23: 14:00:00 75 mL/h via INTRAVENOUS

## 2021-07-23 MED ORDER — ACETAMINOPHEN 325 MG PO TABS
650.0000 mg | ORAL_TABLET | Freq: Four times a day (QID) | ORAL | Status: DC | PRN
  Administered 2021-07-25: 650 mg via ORAL
  Filled 2021-07-23: qty 2

## 2021-07-23 NOTE — ED Provider Notes (Signed)
[facility] Emergency Department Provider Note ____________________________________________   Event Date/Time   First MD Initiated Contact with Patient 07/23/21 1153     (approximate)  I have reviewed the triage vital signs and the nursing notes.   HISTORY  Chief Complaint Atrial Fibrillation (Pt coming from [facility] and had mechanical fall last night around 5 pm. Pt reports no pain and denies hitting head. Pt found to be afib rvr on medic arrival this AM. Pt's only complaint is nausea. GBL 378 for medics. )    HPI Barbara Frank is a 75 y.o. female with PMH as noted below including atrial fibrillation and diabetes who presents with generalized weakness and a fall, acute onset yesterday.  The patient states that she was walking to the bathroom, felt weak and dizzy as if she was going to pass out.  She then fell but did not hit her head.  She states that something fell on top of her.  She did not get up because she was worried she could have hurt her spine believed to that she could injure herself further if she tried to get up or move.  The patient states that she still feels weak and as if she would not be able to stand up.  However, she denies any acute pain.   Past Medical History:  Diagnosis Date   A-fib [facility])    Depression    Diabetes mellitus without complication ([place])    History of kidney stones    Kidney mass     Patient Active Problem List   Diagnosis Date Noted   Fall at home, initial encounter 07/23/2021   Lung nodule 07/23/2021   Elevated lactic acid level 07/23/2021   Prolonged QT interval 07/23/2021   Atrial fibrillation, chronic (HCC)    Weakness    Palliative care encounter    Acute cystitis with hematuria    Type 2 diabetes mellitus without complication, with long-term current use of insulin (HCC)    Increased ammonia level    Encephalopathy 03/07/2021   Transient confusion 03/06/2021   Intertrigo 03/06/2021    Encounter for antineoplastic chemotherapy 01/08/2021   Encounter for antineoplastic immunotherapy 01/08/2021   Goals of care, counseling/discussion 10/17/2020   Hypercalcemia 10/17/2020   Chest mass 10/17/2020   Atrial fibrillation with RVR ([place]) 09/26/2020   Depression 09/26/2020   Diabetes type 2, uncontrolled 09/26/2020   Hypokalemia 09/26/2020   Renal cell carcinoma ([place]) 09/26/2020   Acute delirium 09/26/2020   Ureteral stone 09/26/2020    Past Surgical History:  Procedure Laterality Date   GASTRIC BYPASS     HERNIA REPAIR     TONSILLECTOMY     URETEROSCOPY WITH HOLMIUM LASER LITHOTRIPSY Left     Prior to Admission medications   Medication Sig Start Date End Date Taking? Authorizing Provider  apixaban (ELIQUIS) 5 MG TABS tablet Take 5 mg by mouth 2 (two) times daily.   Yes [provider]  axitinib (INLYTA) 5 MG tablet Take 1 tablet (5 mg total) by mouth 2 (two) times daily. 03/31/21  Yes [name], MD  cetirizine (ZYRTEC) 10 MG tablet Take 5 mg by mouth daily.   Yes [provider]  escitalopram (LEXAPRO) 10 MG tablet Take 10 mg by mouth daily.   Yes [provider]  insulin glargine (LANTUS) 100 UNIT/ML injection Inject 25 Units into the skin 2 (two) times daily.   Yes [provider]  metFORMIN (GLUCOPHAGE-XR) 500 MG 24 hr tablet 2,000  mg daily with supper.   Yes [provider]  metoprolol succinate (TOPROL-XL) 50 MG 24 hr tablet Take 50 mg by mouth daily. Take with or immediately following a meal.   Yes [provider]  diclofenac Sodium (VOLTAREN) 1 % GEL Apply 4 g topically 4 (four) times daily as needed (pain). Patient not taking: Reported on 05/06/2021 03/16/21   Loletha Grayer, MD  diltiazem (CARDIZEM CD) 180 MG 24 hr capsule Take 1 capsule (180 mg total) by mouth every evening. Patient not taking: Reported on 06/29/2021 03/16/21   Loletha Grayer, MD  feeding supplement, GLUCERNA SHAKE, (GLUCERNA SHAKE) LIQD Take  237 mLs by mouth 3 (three) times daily between meals. Patient not taking: Reported on 06/29/2021 03/16/21   Loletha Grayer, MD  insulin aspart (NOVOLOG) 100 UNIT/ML injection Inject 4 Units into the skin 3 (three) times daily with meals. Patient not taking: Reported on 05/06/2021 03/16/21   Loletha Grayer, MD  lactulose (CHRONULAC) 10 GM/15ML solution Take 30 mLs (20 g total) by mouth daily. Patient not taking: Reported on 06/29/2021 03/17/21   Loletha Grayer, MD  nystatin (MYCOSTATIN/NYSTOP) powder Apply topically 3 (three) times daily. Patient not taking: Reported on 05/06/2021 03/16/21   Loletha Grayer, MD    Allergies Kiwi extract  Family History  Problem Relation Age of Onset   Breast cancer Mother    Bladder Cancer Neg Hx    Kidney cancer Neg Hx    Prostate cancer Neg Hx     Social History Social History   Tobacco Use   Smoking status: Never   Smokeless tobacco: Never  Vaping Use   Vaping Use: Never used  Substance Use Topics   Alcohol use: Not Currently   Drug use: Not Currently    Review of Systems  Constitutional: No fever.  Positive for generalized weakness. Eyes: No redness. ENT: No sore throat. Cardiovascular: Denies chest pain. Respiratory: Denies shortness of breath. Gastrointestinal: No vomiting or diarrhea.  Genitourinary: Negative for dysuria.  Musculoskeletal: Positive for low back pain. Skin: Negative for rash. Neurological: Negative for headaches, focal weakness or numbness.   ____________________________________________   PHYSICAL EXAM:  VITAL SIGNS: ED Triage Vitals  Enc Vitals Group     BP 07/23/21 1142 (!) 162/97     Pulse Rate 07/23/21 1142 (!) 128     Resp 07/23/21 1142 20     Temp 07/23/21 1142 97.9 F (36.6 C)     Temp Source 07/23/21 1142 Axillary     SpO2 07/23/21 1142 98 %     Weight 07/23/21 1143 161 lb (73 kg)     Height 07/23/21 1143 5\' 4"  (1.626 m)     Head Circumference --      Peak Flow --      Pain Score  07/23/21 1143 0     Pain Loc --      Pain Edu? --      Excl. in [place]? --     Constitutional: Alert and oriented.  Weak appearing but in no acute distress. Eyes: Conjunctivae are normal.  EOMI.  PERRLA. Head: Atraumatic. Nose: No congestion/rhinnorhea. Mouth/Throat: Mucous membranes are dry. Neck: Normal range of motion.  Cardiovascular: Tachycardic, irregular rhythm. Grossly normal heart sounds.  Good peripheral circulation. Respiratory: Normal respiratory effort.  No retractions. Lungs CTAB. Gastrointestinal: Soft and nontender. No distention.  Genitourinary: No flank tenderness. Musculoskeletal: No lower extremity edema.  Extremities warm and well perfused.  Mild cervical and lumbar midline spinal tenderness with no step-off or crepitus.  Full range of motion to bilateral hips, knees, shoulders, and elbows. Neurologic:  Normal speech and language. No gross focal neurologic deficits are appreciated.  Skin:  Skin is warm and dry. No rash noted. Psychiatric: Mood and affect are normal. Speech and behavior are normal.  ____________________________________________   LABS (all labs ordered are listed, but only abnormal results are displayed)  Labs Reviewed  COMPREHENSIVE METABOLIC PANEL - Abnormal; Notable for the following components:      Result Value   Glucose, Bld 291 (*)    Total Bilirubin 1.8 (*)    All other components within normal limits  CBC WITH DIFFERENTIAL/PLATELET - Abnormal; Notable for the following components:   RBC 5.83 (*)    Hemoglobin 15.4 (*)    HCT 48.4 (*)    All other components within normal limits  LACTIC ACID, PLASMA - Abnormal; Notable for the following components:   Lactic Acid, Venous 2.4 (*)    All other components within normal limits  BRAIN NATRIURETIC PEPTIDE - Abnormal; Notable for the following components:   B Natriuretic Peptide 164.6 (*)    All other components within normal limits  RESP PANEL BY RT-PCR (FLU A&B, COVID) ARPGX2  CULTURE,  BLOOD (ROUTINE X 2)  CULTURE, BLOOD (ROUTINE X 2)  CK  PROCALCITONIN  URINALYSIS, ROUTINE W REFLEX MICROSCOPIC  LACTIC ACID, PLASMA  LACTIC ACID, PLASMA  TROPONIN I (HIGH SENSITIVITY)   ____________________________________________  EKG  ED ECG REPORT I, Arta Silence, the attending physician, personally viewed and interpreted this ECG.  Date: 07/23/2021 EKG Time: 1137 Rate: 125 Rhythm: Atrial fibrillation with RVR QRS Axis: Left axis  Intervals: RBBB ST/T Wave abnormalities: normal Narrative Interpretation: Atrial fibrillation with no evidence of acute ischemia  ____________________________________________  RADIOLOGY  Chest x-ray interpreted by me shows no focal consolidation or edema  CT head: No ICH or other acute abnormality CT cervical spine: No acute fracture CT thoracic spine: No acute fracture CT lumbar spine: No acute fracture  ____________________________________________   PROCEDURES  Procedure(s) performed: No  Procedures  Critical Care performed: No ____________________________________________   INITIAL IMPRESSION / ASSESSMENT AND PLAN / ED COURSE  Pertinent labs & imaging results that were available during my care of the patient were reviewed by me and considered in my medical decision making (see chart for details).   75 year old female with PMH as noted above including atrial fibrillation and diabetes presents after she fell yesterday afternoon in the context of feeling weak and dizzy, and had been on the floor since then.  I reviewed the past medical records in Epic; the patient was most recently admitted in July with delirium associated with acute cystitis.  Differential includes acute infection/sepsis, dehydration, AKI, rhabdomyolysis, hyperglycemia/DKA, other metabolic cause, or less likely CNS or cardiac etiology.  We will obtain imaging of the head and spine, lab work-up, give fluids, and  reassess.  ----------------------------------------- 2:02 PM on 07/23/2021 -----------------------------------------  Lab work-up is overall reassuring although the patient's lactate is elevated.  I am concerned for sepsis although there is no source yet at this time.  I have ordered empiric antibiotics for possible UTI.  I consulted Dr. Blaine Hamper from the hospitalist service for admission.  ____________________________________________   FINAL CLINICAL IMPRESSION(S) / ED DIAGNOSES  Final diagnoses:  Generalized weakness  Atrial fibrillation with RVR (HCC)      NEW MEDICATIONS STARTED DURING THIS VISIT:  New Prescriptions   No medications on file     Note:  This document was prepared using Dragon  voice recognition software and may include unintentional dictation errors.    Arta Silence, MD 07/23/21 1526

## 2021-07-23 NOTE — ED Notes (Signed)
Patient changed out of home clothes and placed in hospital gown. Abdominal fold found to be severely raw with little yellow oozing. MD Blaine Hamper made aware. New purwick placed and pericare performed due to incontinence of urine and bed wet.

## 2021-07-23 NOTE — H&P (Addendum)
History and Physical    Barbara Frank GBE:[id number] DOB: [birth year] DOA: 07/23/2021  Referring MD/NP/PA:   PCP: Sheral Apley   Patient coming from:  The patient is coming from [facility] retirement   Chief Complaint: fall  HPI: Barbara Frank is a 75 y.o. female with medical history significant of diabetes mellitus, atrial fibrillation on Eliquis, depression, renal cell carcinoma, lung nodule, who presents with fall.  Pt is a poor historian.  I called her niece by phone, but she did not talk to patient for several months, therefore could not provide detailed information.  History is limited.  Per report, patient had mechanical fall last night around 5 pm when she was walking to the bathroom. She states that felt weak and dizzy, as if she was going to pass out, but did not.  No loss of consciousness.  Denies head or neck injury. She states that something fell on top of her. She states that she did not get up because she was worried she could injure herself further if she tried to get up or move. She has nausea, no vomiting, diarrhea or abdominal pain.  Denies chest pain, shortness breath, cough.  No fever or chills.  Denies symptoms of UTI.    Patient was found to have A. fib with RVR, heart rate up to 130s  ED Course: pt was found to have WBC 6.7, lactic acid 2.4, troponin level 9, BNP 164, CK 39, negative COVID PCR, renal function okay, temperature normal, blood pressure 154/99, heart rate 110-130s, RR 20, oxygen saturation 98% on room air.  CT of the head, C-spine, L-spine, T-spine are negative for acute injury.   CT of T spin redemonstrated a 11 mm solid pulmonary nodule in the right upper lobe, as seen on the  0/18/2022 chest CT. Pt is placed on pressor bed for observation.  X-ray showed: 1. No acute cardiopulmonary abnormality. 2. Nodular opacity in the periphery of the right mid lung, not appreciably changed compared to the prior CT, when accounting for differences in  technique.  Review of Systems:   General: no fevers, chills, no body weight gain, has fatigue HEENT: no blurry vision, hearing changes or sore throat Respiratory: no dyspnea, coughing, wheezing CV: no chest pain, no palpitations GI: has nausea, no vomiting, abdominal pain, diarrhea, constipation GU: no dysuria, burning on urination, increased urinary frequency, hematuria  Ext: no leg edema Neuro: no unilateral weakness, numbness, or tingling, no vision change or hearing loss. Has fall Skin:   Patient has intertrigo dermatitis in abdominal skin fold MSK: No muscle spasm, no deformity, no limitation of range of movement in spin Heme: No easy bruising.  Travel history: No recent long distant travel.  Allergy:  Allergies  Allergen Reactions   Kiwi Extract     Past Medical History:  Diagnosis Date   A-fib ([place])    Depression    Diabetes mellitus without complication ([place])    History of kidney stones    Kidney mass     Past Surgical History:  Procedure Laterality Date   GASTRIC BYPASS     HERNIA REPAIR     TONSILLECTOMY     URETEROSCOPY WITH HOLMIUM LASER LITHOTRIPSY Left     Social History:  reports that she has never smoked. She has never used smokeless tobacco. She reports that she does not currently use alcohol. She reports that she does not currently use drugs.  Family History:  Family History  Problem Relation Age of Onset  Breast cancer Mother    Bladder Cancer Neg Hx    Kidney cancer Neg Hx    Prostate cancer Neg Hx      Prior to Admission medications   Medication Sig Start Date End Date Taking? Authorizing Provider  acetaminophen (TYLENOL) 650 MG CR tablet Take 650 mg by mouth every 4 (four) hours as needed for pain or fever. Patient not taking: Reported on 06/03/2021    [provider]  axitinib (INLYTA) 5 MG tablet Take 1 tablet (5 mg total) by mouth 2 (two) times daily. 03/31/21   [name], MD  cetirizine (ZYRTEC) 10 MG tablet Take 10 mg by mouth  daily.    [provider]  cyanocobalamin (,VITAMIN B-12,) 1000 MCG/ML injection Inject into the muscle. Patient not taking: No sig reported 09/24/20   [provider]  diclofenac Sodium (VOLTAREN) 1 % GEL Apply 4 g topically 4 (four) times daily as needed (pain). Patient not taking: No sig reported 03/16/21   Loletha Grayer, MD  diltiazem (CARDIZEM CD) 180 MG 24 hr capsule Take 1 capsule (180 mg total) by mouth every evening. Patient not taking: Reported on 06/29/2021 03/16/21   Loletha Grayer, MD  ELIQUIS 5 MG TABS tablet Take 5 mg by mouth 2 (two) times daily. 08/18/20   [provider]  escitalopram (LEXAPRO) 10 MG tablet Take 10 mg by mouth daily.    [provider]  feeding supplement, GLUCERNA SHAKE, (GLUCERNA SHAKE) LIQD Take 237 mLs by mouth 3 (three) times daily between meals. Patient not taking: Reported on 06/29/2021 03/16/21   Loletha Grayer, MD  insulin aspart (NOVOLOG) 100 UNIT/ML injection Inject 4 Units into the skin 3 (three) times daily with meals. Patient not taking: No sig reported 03/16/21   Loletha Grayer, MD  insulin glargine (LANTUS) 100 UNIT/ML injection Inject 27 Units into the skin 2 (two) times daily. 27 u in the morning and 27 u at night Patient not taking: No sig reported    [provider]  Lactobacillus (PROBIOTIC ACIDOPHILUS PO) Take by mouth. Patient not taking: Reported on 06/29/2021    [provider]  lactulose (CHRONULAC) 10 GM/15ML solution Take 30 mLs (20 g total) by mouth daily. Patient not taking: Reported on 06/29/2021 03/17/21   Loletha Grayer, MD  metFORMIN (GLUCOPHAGE-XR) 500 MG 24 hr tablet SMARTSIG:4 Tablet(s) By Mouth Every Evening 05/18/21   [provider]  metoprolol succinate (TOPROL-XL) 50 MG 24 hr tablet Take 50 mg by mouth daily. Take with or immediately following a meal.    [provider]  nystatin (MYCOSTATIN/NYSTOP) powder Apply topically 3 (three) times  daily. Patient not taking: No sig reported 03/16/21   Loletha Grayer, MD  Pollen Extracts (PROSTAT PO) Take by mouth. Patient not taking: No sig reported    [provider]  Psyllium 48.57 % POWD Take by mouth. Takes 1 QD prn Patient not taking: No sig reported    [provider]    Physical Exam: Vitals:   07/23/21 1142 07/23/21 1143 07/23/21 1322  BP: (!) 162/97  (!) 154/99  Pulse: (!) 128  (!) 112  Resp: 20  20  Temp: 97.9 F (36.6 C)    TempSrc: Axillary    SpO2: 98%  99%  Weight:  73 kg   Height:  5\' 4"  (1.626 m)    General: Not in acute distress.  HEENT:       Eyes: PERRL, EOMI, no scleral icterus.       ENT:  No discharge from the ears and nose, no pharynx injection, no tonsillar enlargement.        Neck: No JVD, no bruit, no mass felt. Heme: No neck lymph node enlargement. Cardiac: S1/S2, Irregularly irregular rhythm, no murmurs, No gallops or rubs. Respiratory: No rales, wheezing, rhonchi or rubs. GI: Soft, nondistended, nontender, no rebound pain, no organomegaly, BS present. GU: No hematuria Ext: No pitting leg edema bilaterally. 1+DP/PT pulse bilaterally. Musculoskeletal: No joint deformities, No joint redness or warmth, no limitation of ROM in spin. Skin:  Patient has intertrigo dermatitis in abdominal skin fold    Neuro: Alert, oriented X3, cranial nerves II-XII grossly intact, moves all extremities normally.  Psych: Patient is not psychotic, no suicidal or hemocidal ideation.  Labs on Admission: I have personally reviewed following labs and imaging studies  CBC: Recent Labs  Lab 07/23/21 1148  WBC 6.7  NEUTROABS 5.0  HGB 15.4*  HCT 48.4*  MCV 83.0  PLT 976   Basic Metabolic Panel: Recent Labs  Lab 07/23/21 1148  NA 136  K 4.1  CL 100  CO2 25  GLUCOSE 291*  BUN 12  CREATININE 0.59  CALCIUM 10.3   GFR: Estimated Creatinine Clearance: 60.4 mL/min (by C-G formula based on SCr of 0.59 mg/dL). Liver Function Tests: Recent  Labs  Lab 07/23/21 1148  AST 20  ALT 12  ALKPHOS 95  BILITOT 1.8*  PROT 7.1  ALBUMIN 3.7   No results for input(s): LIPASE, AMYLASE in the last 168 hours. No results for input(s): AMMONIA in the last 168 hours. Coagulation Profile: No results for input(s): INR, PROTIME in the last 168 hours. Cardiac Enzymes: Recent Labs  Lab 07/23/21 1148  CKTOTAL 39   BNP (last 3 results) No results for input(s): PROBNP in the last 8760 hours. HbA1C: No results for input(s): HGBA1C in the last 72 hours. CBG: No results for input(s): GLUCAP in the last 168 hours. Lipid Profile: No results for input(s): CHOL, HDL, LDLCALC, TRIG, CHOLHDL, LDLDIRECT in the last 72 hours. Thyroid Function Tests: No results for input(s): TSH, T4TOTAL, FREET4, T3FREE, THYROIDAB in the last 72 hours. Anemia Panel: No results for input(s): VITAMINB12, FOLATE, FERRITIN, TIBC, IRON, RETICCTPCT in the last 72 hours. Urine analysis:    Component Value Date/Time   COLORURINE YELLOW (A) 03/06/2021 1726   APPEARANCEUR HAZY (A) 03/06/2021 1726   LABSPEC 1.029 03/06/2021 1726   PHURINE 6.0 03/06/2021 1726   GLUCOSEU >=500 (A) 03/06/2021 1726   HGBUR NEGATIVE 03/06/2021 1726   BILIRUBINUR NEGATIVE 03/06/2021 1726   KETONESUR 5 (A) 03/06/2021 1726   PROTEINUR NEGATIVE 03/06/2021 1726   NITRITE NEGATIVE 03/06/2021 1726   LEUKOCYTESUR NEGATIVE 03/06/2021 1726   Sepsis Labs: @LABRCNTIP (procalcitonin:4,lacticidven:4) ) Recent Results (from the past 240 hour(s))  Resp Panel by RT-PCR (Flu A&B, Covid) Nasopharyngeal Swab     Status: None   Collection Time: 07/23/21 12:13 PM   Specimen: Nasopharyngeal Swab; Nasopharyngeal(NP) swabs in vial transport medium  Result Value Ref Range Status   SARS Coronavirus 2 by RT PCR NEGATIVE NEGATIVE Final    Comment: (NOTE) SARS-CoV-2 target nucleic acids are NOT DETECTED.  The SARS-CoV-2 RNA is generally detectable in upper respiratory specimens during the acute phase of  infection. The lowest concentration of SARS-CoV-2 viral copies this assay can detect is 138 copies/mL. A negative result does not preclude SARS-Cov-2 infection and should not be used as the sole basis for treatment or other patient management decisions. A negative result may occur with  improper  specimen collection/handling, submission of specimen other than nasopharyngeal swab, presence of viral mutation(s) within the areas targeted by this assay, and inadequate number of viral copies(<138 copies/mL). A negative result must be combined with clinical observations, patient history, and epidemiological information. The expected result is Negative.  Fact Sheet for Patients:  EntrepreneurPulse.com.au  Fact Sheet for Healthcare Providers:  IncredibleEmployment.be  This test is no t yet approved or cleared by the [place] FDA and  has been authorized for detection and/or diagnosis of SARS-CoV-2 by FDA under an Emergency Use Authorization (EUA). This EUA will remain  in effect (meaning this test can be used) for the duration of the COVID-19 declaration under Section 564(b)(1) of the Act, 21 U.S.C.section 360bbb-3(b)(1), unless the authorization is terminated  or revoked sooner.       Influenza A by PCR NEGATIVE NEGATIVE Final   Influenza B by PCR NEGATIVE NEGATIVE Final    Comment: (NOTE) The Xpert Xpress SARS-CoV-2/FLU/RSV plus assay is intended as an aid in the diagnosis of influenza from Nasopharyngeal swab specimens and should not be used as a sole basis for treatment. Nasal washings and aspirates are unacceptable for Xpert Xpress SARS-CoV-2/FLU/RSV testing.  Fact Sheet for Patients: EntrepreneurPulse.com.au  Fact Sheet for Healthcare Providers: IncredibleEmployment.be  This test is not yet approved or cleared by the [place] FDA and has been authorized for detection and/or diagnosis of SARS-CoV-2  by FDA under an Emergency Use Authorization (EUA). This EUA will remain in effect (meaning this test can be used) for the duration of the COVID-19 declaration under Section 564(b)(1) of the Act, 21 U.S.C. section 360bbb-3(b)(1), unless the authorization is terminated or revoked.  Performed at [facility], [address] [place], [place] [address]      Radiological Exams on Admission: CT Head Wo Contrast  Result Date: 07/23/2021 CLINICAL DATA:  Fall EXAM: CT HEAD WITHOUT CONTRAST CT CERVICAL SPINE WITHOUT CONTRAST TECHNIQUE: Multidetector CT imaging of the head and cervical spine was performed following the standard protocol without intravenous contrast. Multiplanar CT image reconstructions of the cervical spine were also generated. COMPARISON:  CT head dated March 06, 2021 FINDINGS: CT HEAD FINDINGS Brain: Chronic white matter ischemic change. No evidence of acute infarction, hemorrhage, hydrocephalus, extra-axial collection or mass lesion/mass effect. Vascular: No hyperdense vessel or unexpected calcification. Skull: Normal. Negative for fracture or focal lesion. Sinuses/Orbits: Wall thickening of the left maxillary sinus. No acute abnormality. Other: None. CT CERVICAL SPINE FINDINGS Alignment: Normal. Skull base and vertebrae: No acute fracture. No primary bone lesion or focal pathologic process. Soft tissues and spinal canal: No prevertebral fluid or swelling. No visible canal hematoma. Disc levels:  Mild multilevel degenerative disc disease. Upper chest: Negative. Other: None. IMPRESSION: 1. No acute intracranial abnormality. 2. No CT evidence of acute cervical spine injury. Electronically Signed   By: Yetta Glassman M.D.   On: 07/23/2021 12:57   CT Cervical Spine Wo Contrast  Result Date: 07/23/2021 CLINICAL DATA:  Fall EXAM: CT HEAD WITHOUT CONTRAST CT CERVICAL SPINE WITHOUT CONTRAST TECHNIQUE: Multidetector CT imaging of the head and cervical spine was performed following the  standard protocol without intravenous contrast. Multiplanar CT image reconstructions of the cervical spine were also generated. COMPARISON:  CT head dated March 06, 2021 FINDINGS: CT HEAD FINDINGS Brain: Chronic white matter ischemic change. No evidence of acute infarction, hemorrhage, hydrocephalus, extra-axial collection or mass lesion/mass effect. Vascular: No hyperdense vessel or unexpected calcification. Skull: Normal. Negative for fracture or focal lesion. Sinuses/Orbits: Wall thickening of  the left maxillary sinus. No acute abnormality. Other: None. CT CERVICAL SPINE FINDINGS Alignment: Normal. Skull base and vertebrae: No acute fracture. No primary bone lesion or focal pathologic process. Soft tissues and spinal canal: No prevertebral fluid or swelling. No visible canal hematoma. Disc levels:  Mild multilevel degenerative disc disease. Upper chest: Negative. Other: None. IMPRESSION: 1. No acute intracranial abnormality. 2. No CT evidence of acute cervical spine injury. Electronically Signed   By: Yetta Glassman M.D.   On: 07/23/2021 12:57   CT Thoracic Spine Wo Contrast  Result Date: 07/23/2021 CLINICAL DATA:  Back trauma, fall, poor historian EXAM: CT THORACIC AND LUMBAR SPINE WITHOUT CONTRAST TECHNIQUE: Multidetector CT imaging of the thoracic and lumbar spine was performed without contrast. Multiplanar CT image reconstructions were also generated. COMPARISON:  No prior CT of the thoracic and lumbar spine, correlation is made with CT chest abdomen pelvis 06/23/2021 FINDINGS: CT THORACIC SPINE FINDINGS Alignment: Dextrocurvature of the upper thoracic spine and levocurvature of the lower thoracic spine, with dextrocurvature of the lumbar spine. No listhesis. Vertebrae: Osteopenia. No acute fracture or suspicious osseous lesion. No fracture in the imaged ribs. Paraspinal and other soft tissues: Solid pulmonary nodule in the anterior right upper lobe, measuring up to 1.1 cm (series 6, image 50), which  appears unchanged compared to 06/23/2021 the previously noted right middle lobe nodule is not included in the field view. No pleural effusion. No pneumothorax visualized. Large right renal lesion, incompletely imaged. Disc levels: Mild degenerative changes without significant spinal canal stenosis. CT LUMBAR SPINE FINDINGS Segmentation: 5 lumbar type vertebrae. Alignment: Dextrocurvature of the lumbar spine. Trace anterolisthesis of L4 on L5, unchanged compared to the prior exam. Vertebrae: Osteopenia.  No acute fracture or pathologic process. Paraspinal and other soft tissues: Aortic calcifications. Large right renal mass, better evaluated on 06/23/2021 and incompletely imaged here. Degenerative changes in the bilateral sacroiliac joints. Disc levels: Disc height loss and mild disc bulges, without significant spinal canal stenosis. No high-grade neural foraminal narrowing. IMPRESSION: CT THORACIC SPINE IMPRESSION 1. No acute fracture or traumatic listhesis. 2. Redemonstrated 11 mm solid pulmonary nodule in the right upper lobe, as seen on the 06/23/2021 chest CT. A previously noted right middle lobe nodule is not included in the field of view. CT LUMBAR SPINE IMPRESSION 1. No acute fracture or traumatic listhesis. 2. Incompletely imaged right renal mass, better evaluated on 06/23/2021. Electronically Signed   By: Merilyn Baba M.D.   On: 07/23/2021 13:03   CT Lumbar Spine Wo Contrast  Result Date: 07/23/2021 CLINICAL DATA:  Back trauma, fall, poor historian EXAM: CT THORACIC AND LUMBAR SPINE WITHOUT CONTRAST TECHNIQUE: Multidetector CT imaging of the thoracic and lumbar spine was performed without contrast. Multiplanar CT image reconstructions were also generated. COMPARISON:  No prior CT of the thoracic and lumbar spine, correlation is made with CT chest abdomen pelvis 06/23/2021 FINDINGS: CT THORACIC SPINE FINDINGS Alignment: Dextrocurvature of the upper thoracic spine and levocurvature of the lower thoracic  spine, with dextrocurvature of the lumbar spine. No listhesis. Vertebrae: Osteopenia. No acute fracture or suspicious osseous lesion. No fracture in the imaged ribs. Paraspinal and other soft tissues: Solid pulmonary nodule in the anterior right upper lobe, measuring up to 1.1 cm (series 6, image 50), which appears unchanged compared to 06/23/2021 the previously noted right middle lobe nodule is not included in the field view. No pleural effusion. No pneumothorax visualized. Large right renal lesion, incompletely imaged. Disc levels: Mild degenerative changes without significant spinal canal stenosis.  CT LUMBAR SPINE FINDINGS Segmentation: 5 lumbar type vertebrae. Alignment: Dextrocurvature of the lumbar spine. Trace anterolisthesis of L4 on L5, unchanged compared to the prior exam. Vertebrae: Osteopenia.  No acute fracture or pathologic process. Paraspinal and other soft tissues: Aortic calcifications. Large right renal mass, better evaluated on 06/23/2021 and incompletely imaged here. Degenerative changes in the bilateral sacroiliac joints. Disc levels: Disc height loss and mild disc bulges, without significant spinal canal stenosis. No high-grade neural foraminal narrowing. IMPRESSION: CT THORACIC SPINE IMPRESSION 1. No acute fracture or traumatic listhesis. 2. Redemonstrated 11 mm solid pulmonary nodule in the right upper lobe, as seen on the 06/23/2021 chest CT. A previously noted right middle lobe nodule is not included in the field of view. CT LUMBAR SPINE IMPRESSION 1. No acute fracture or traumatic listhesis. 2. Incompletely imaged right renal mass, better evaluated on 06/23/2021. Electronically Signed   By: Merilyn Baba M.D.   On: 07/23/2021 13:03   DG Chest Portable 1 View  Result Date: 07/23/2021 CLINICAL DATA:  Weakness. History of metastatic renal cell carcinoma. EXAM: PORTABLE CHEST 1 VIEW COMPARISON:  03/06/2021, 06/23/2021 FINDINGS: Mild cardiomegaly, stable. Atherosclerotic calcification of  the aortic knob. Nodular opacity in the periphery of the right mid lung, not appreciably changed compared to the prior CT, when accounting for differences in technique. No new focal airspace consolidation. No pleural effusion or pneumothorax. IMPRESSION: 1. No acute cardiopulmonary abnormality. 2. Nodular opacity in the periphery of the right mid lung, not appreciably changed compared to the prior CT, when accounting for differences in technique. Electronically Signed   By: Davina Poke D.O.   On: 07/23/2021 13:15     EKG: I have personally reviewed.  Atrial fibrillation with RVR, heart rate 125, QTc 528, bifascicular block  Assessment/Plan Principal Problem:   Atrial fibrillation with RVR (HCC) Active Problems:   Depression   Renal cell carcinoma (HCC)   Type 2 diabetes mellitus without complication, with long-term current use of insulin ([place])   Fall at home, initial encounter   Lung nodule   Elevated lactic acid level   Prolonged QT interval   Atrial fibrillation with RVR ([place]): Heart rate in 110s-130s.  Possibly due to medication noncompliance.  Patient has Cardizem and metoprolol on her med list, but patient dose not seem to take these medications.  -Placed on progressive bed for observation -Resume Cardizem 180 mg daily -As needed IV metoprolol 2.5 mg every 3 hours -If heart rate is not controlled, will start Cardizem drip  Depression -Hold Lexapro due to QT prolongation  Renal cell carcinoma [facility]): Patient is following up with oncologist, Dr. Tasia Catchings.  Patient used to be on Axitinib. Per Dr. Tasia Catchings, chemotherapy is on hold due to poorly controlled hyperglycemia and diabetes -Follow-up with Dr. Tasia Catchings  Type 2 diabetes mellitus without complication, with long-term current use of insulin ([place]): A1c 8.8 recently, poorly controlled.  Patient is supposed to take metformin, NovoLog and Lantus, but patient is compliant in taking her medications. She is supposed to take Lantus 25 unit twice  daily -Glargine insulin 18 units twice daily -Sliding scale insulin  Fall at home, initial encounter -fall precaution -pt/ot  Lung nodule: per Dr. Tasia Catchings, pt has  waxing and waning pulmonary nodules. [place] related to renal cell carcinoma -Follow-up with oncologist, Dr.Yu  Elevated lactic acid level: Lactic acid 2.4.  Does not meet criteria for sepsis or SIRS.  No fever or leukocytosis.  Likely due to dehydration.  No source of infection identified.  Patient  received 1 dose of Rocephin for possible UTI ED. -IV fluid: 1 L normal saline, then 75 cc/h --will get Procalcitonin and trend lactic acid levels per sepsis protocol. -f/u UA and blood culture  Prolonged QT interval -hold Lexapro  Intertrigo dermatitis -Start miconazole powder topically   DVT ppx: on Eliquis  Code Status: Full code Family Communication:  I called her Niece by phone Disposition Plan:  Anticipate discharge back to previous environment, retirement facility Consults called:  none Admission status and Level of care: Progressive:     for obs   Status is: Observation  The patient remains OBS appropriate and will d/c before 2 midnights.           Date of Service 07/23/2021    [facility]   If 7PM-7AM, please contact night-coverage [email or web address] 07/23/2021, 3:01 PM

## 2021-07-24 DIAGNOSIS — I4891 Unspecified atrial fibrillation: Secondary | ICD-10-CM | POA: Diagnosis not present

## 2021-07-24 LAB — BASIC METABOLIC PANEL
Anion gap: 8 (ref 5–15)
BUN: 12 mg/dL (ref 8–23)
CO2: 22 mmol/L (ref 22–32)
Calcium: 9.8 mg/dL (ref 8.9–10.3)
Chloride: 105 mmol/L (ref 98–111)
Creatinine, Ser: 0.43 mg/dL — ABNORMAL LOW (ref 0.44–1.00)
GFR, Estimated: >60 mL/min (ref >60)
Glucose, Bld: 254 mg/dL — ABNORMAL HIGH (ref 70–99)
Potassium: 3.6 mmol/L (ref 3.5–5.1)
Sodium: 135 mmol/L (ref 135–145)

## 2021-07-24 LAB — GLUCOSE, CAPILLARY
Glucose-Capillary: 280 mg/dL — ABNORMAL HIGH (ref 70–99)
Glucose-Capillary: 290 mg/dL — ABNORMAL HIGH (ref 70–99)
Glucose-Capillary: 200 mg/dL — ABNORMAL HIGH (ref 70–99)
Glucose-Capillary: 219 mg/dL — ABNORMAL HIGH (ref 70–99)

## 2021-07-24 LAB — HEMOGLOBIN A1C
Hgb A1c MFr Bld: 10.3 % — ABNORMAL HIGH (ref 4.8–5.6)
Mean Plasma Glucose: 248.91 mg/dL

## 2021-07-24 MED ORDER — INSULIN GLARGINE-YFGN 100 UNIT/ML ~~LOC~~ SOLN
25.0000 [IU] | Freq: Two times a day (BID) | SUBCUTANEOUS | Status: DC
  Administered 2021-07-24 – 2021-07-27 (×7): 25 [IU] via SUBCUTANEOUS
  Filled 2021-07-24 (×8): qty 0.25

## 2021-07-24 MED ORDER — METOPROLOL SUCCINATE ER 50 MG PO TB24
50.0000 mg | ORAL_TABLET | Freq: Every day | ORAL | Status: DC
  Administered 2021-07-24 – 2021-07-27 (×4): 50 mg via ORAL
  Filled 2021-07-24 (×4): qty 1

## 2021-07-24 MED ORDER — ADULT MULTIVITAMIN W/MINERALS CH
1.0000 | ORAL_TABLET | Freq: Every day | ORAL | Status: DC
  Administered 2021-07-24 – 2021-07-31 (×8): 1 via ORAL
  Filled 2021-07-24 (×8): qty 1

## 2021-07-24 NOTE — Progress Notes (Signed)
Initial Nutrition Assessment  DOCUMENTATION CODES:   Not applicable  INTERVENTION:   -Magic cup BID with meals, each supplement provides 290 kcal and 9 grams of protein  -MVI with minerals daily  NUTRITION DIAGNOSIS:   Increased nutrient needs related to acute illness as evidenced by estimated needs.  GOAL:   Patient will meet greater than or equal to 90% of their needs  MONITOR:   PO intake, Supplement acceptance, Labs, Weight trends, Skin, I & O's  REASON FOR ASSESSMENT:   Malnutrition Screening Tool    ASSESSMENT:   Barbara Frank is a 75 y.o. female with medical history significant of diabetes mellitus, atrial fibrillation on Eliquis, depression, renal cell carcinoma, lung nodule, who presents with fall.  Pt admitted with a-fib with RVR.   Reviewed I/O's: +2.3 L x 24 hours  UOP: 300 ml x 24 hours  Spoke with pt at bedside, who smiled at this RD and unable to provide history. She reports she has a good appetite. Noted meal completion 100%.   Reviewed wt hx; pt has experienced a 7% wt loss over the past 3 months. While this is not significant for time frame, it is concerning given advanced age and multiple co-morbidities.   Medications reviewed and include cardizem.   Lab Results  Component Value Date   HGBA1C 10.3 (H) 07/24/2021   PTA DM medications are 25 units insulin glargine BID and 2000 mg metformin daily.   Labs reviewed: CBGS: [id number] (inpatient orders for glycemic control are 0-5 units insulin aspart TID with meals and 25 units insulin glargine-yfgn daily).    NUTRITION - FOCUSED PHYSICAL EXAM:  Flowsheet Row Most Recent Value  Orbital Region No depletion  Upper Arm Region No depletion  Thoracic and Lumbar Region No depletion  Buccal Region No depletion  Temple Region No depletion  Clavicle Bone Region No depletion  Clavicle and Acromion Bone Region No depletion  Scapular Bone Region No depletion  Dorsal Hand No depletion  Patellar Region No  depletion  Anterior Thigh Region No depletion  Posterior Calf Region No depletion  Edema (RD Assessment) None  Hair Reviewed  Eyes Reviewed  Mouth Reviewed  Skin Reviewed  Nails Reviewed       Diet Order:   Diet Order             Diet heart healthy/carb modified Room service appropriate? Yes; Fluid consistency: Thin  Diet effective now                   EDUCATION NEEDS:   Education needs have been addressed  Skin:  Skin Assessment: Reviewed RN Assessment  Last BM:  Unknown  Height:   Ht Readings from Last 1 Encounters:  07/23/21 5\' 4"  (1.626 m)    Weight:   Wt Readings from Last 1 Encounters:  07/23/21 73 kg    Ideal Body Weight:  54.5 kg  BMI:  Body mass index is 27.64 kg/m.  Estimated Nutritional Needs:   Kcal:  1850-2050  Protein:  95-110 grams  Fluid:  > 1.8 L    Loistine Chance, RD, LDN, [place] Registered Dietitian II Certified Diabetes Care and Education Specialist Please refer to [facility] for RD and/or RD on-call/weekend/after hours pager

## 2021-07-24 NOTE — Progress Notes (Signed)
Mobility Specialist - Progress Note   07/24/21 1200  Mobility  Activity Ambulated in hall  Level of Assistance Minimal assist, patient does 75% or more  Assistive Device Front wheel walker  Distance Ambulated (ft) 2 ft  Mobility Sit up in bed/chair position for meals  Mobility Response Tolerated well  Mobility performed by Mobility specialist  $Mobility charge 1 Mobility    Mobility responded to chair alarm, pt attempting to exit recliner on arrival with leg rest still up. Assisted pt with transfer to bed. Alarm set.    Kathee Delton Mobility Specialist 07/24/21, 12:09 PM

## 2021-07-24 NOTE — Evaluation (Signed)
Occupational Therapy Evaluation Patient Details Name: Barbara Frank MRN: [id number] DOB: 12-31-1945 Today's Date: 07/24/2021   History of Present Illness 75 y.o. female with medical history significant of diabetes mellitus, atrial fibrillation on Eliquis, depression, renal cell carcinoma, lung nodule, who presents with fall.   Clinical Impression   Patient presenting with decreased Ind in self care, balance, functional mobility/transfer, endurance, and safety awareness. Patient reports being independent with use of RW and living at [facility]. Pt is unable to describe further any assistance she has with IADL tasks. She was oriented to self and location only this session. She needed mod- max A to get to EOB. Pt standing with mod A from standard height bed with RW and significant posterior bias in standing. Side steps along bed with min - mod A while leaning towards bed. Pt with decreased insight to deficits this session. Patient will benefit from acute OT to increase overall independence in the areas of ADLs, functional mobility, and safety awareness in order to safely discharge to next venue of care.      Recommendations for follow up therapy are one component of a multi-disciplinary discharge planning process, led by the attending physician.  Recommendations may be updated based on patient status, additional functional criteria and insurance authorization.   Follow Up Recommendations  Skilled nursing-short term rehab (<3 hours/day)    Assistance Recommended at Discharge Frequent or constant Supervision/Assistance  Functional Status Assessment  Patient has had a recent decline in their functional status and demonstrates the ability to make significant improvements in function in a reasonable and predictable amount of time.  Equipment Recommendations  Other (comment) (defer to next venue of care)       Precautions / Restrictions Precautions Precautions: Fall      Mobility Bed  Mobility Overal bed mobility: Needs Assistance Bed Mobility: Supine to Sit;Sit to Supine     Supine to sit: Max assist Sit to supine: Mod assist   General bed mobility comments: trunk support and B LEs    Transfers Overall transfer level: Needs assistance Equipment used: Rolling walker (2 wheels) Transfers: Sit to/from Stand Sit to Stand: Mod assist           General transfer comment: mod lifting assistance with significant posterior bias in standing      Balance Overall balance assessment: Needs assistance Sitting-balance support: Feet supported;Bilateral upper extremity supported Sitting balance-Leahy Scale: Fair     Standing balance support: Reliant on assistive device for balance;During functional activity Standing balance-Leahy Scale: Poor                             ADL either performed or assessed with clinical judgement   ADL Overall ADL's : Needs assistance/impaired     Grooming: Wash/dry hands;Wash/dry face;Sitting;Set up;Minimal assistance Grooming Details (indicate cue type and reason): min A for sitting balance             Lower Body Dressing: Maximal assistance;Sit to/from stand                       Vision Baseline Vision/History: 1 Wears glasses Patient Visual Report: No change from baseline              Pertinent Vitals/Pain Pain Assessment: No/denies pain     Hand Dominance Right   Extremity/Trunk Assessment Upper Extremity Assessment Upper Extremity Assessment: Overall WFL for tasks assessed;Generalized weakness   Lower Extremity Assessment Lower  Extremity Assessment: Overall WFL for tasks assessed;Generalized weakness       Communication Communication Communication: No difficulties   Cognition Arousal/Alertness: Awake/alert Behavior During Therapy: WFL for tasks assessed/performed Overall Cognitive Status: No family/caregiver present to determine baseline cognitive functioning                                  General Comments: Pt with very delayed response to questions. Oriented to self and location only. Limited insight to deficits. She follows simple commands with increased time .                Home Living Family/patient expects to be discharged to:: Skilled nursing facility                                        Prior Functioning/Environment Prior Level of Function : Patient poor historian/Family not available               ADLs Comments: Pt reports mod I with use of RW for mobility and self care. No one present to confirm baseline.        OT Problem List: Decreased strength;Decreased range of motion;Decreased activity tolerance;Impaired balance (sitting and/or standing);Decreased knowledge of use of DME or AE;Decreased safety awareness;Decreased cognition;Cardiopulmonary status limiting activity      OT Treatment/Interventions: Self-care/ADL training;Therapeutic exercise;Therapeutic activities;Energy conservation;DME and/or AE instruction;Patient/family education;Manual therapy;Balance training    OT Goals(Current goals can be found in the care plan section) Acute Rehab OT Goals Patient Stated Goal: to be able to walk OT Goal Formulation: With patient Time For Goal Achievement: 08/07/21 Potential to Achieve Goals: Good ADL Goals Pt Will Perform Grooming: with min guard assist Pt Will Perform Lower Body Dressing: with min guard assist Pt Will Transfer to Toilet: with min guard assist Pt Will Perform Toileting - Clothing Manipulation and hygiene: with min guard assist  OT Frequency: Min 2X/week   Barriers to D/C:    none known at this time          AM-PAC OT "6 Clicks" Daily Activity     Outcome Measure Help from another person eating meals?: None Help from another person taking care of personal grooming?: A Little Help from another person toileting, which includes using toliet, bedpan, or urinal?: A Lot Help from another  person bathing (including washing, rinsing, drying)?: A Little Help from another person to put on and taking off regular upper body clothing?: A Little Help from another person to put on and taking off regular lower body clothing?: A Lot 6 Click Score: 17   End of Session Equipment Utilized During Treatment: Rolling walker (2 wheels) Nurse Communication: Mobility status  Activity Tolerance: Patient tolerated treatment well Patient left: in bed;with call bell/phone within reach;with bed alarm set  OT Visit Diagnosis: Unsteadiness on feet (R26.81);Repeated falls (R29.6);Muscle weakness (generalized) (M62.81);History of falling (Z91.81)                Time: 4696-2952 OT Time Calculation (min): 17 min Charges:  OT General Charges $OT Visit: 1 Visit OT Evaluation $OT Eval Moderate Complexity: 1 Mod OT Treatments $Self Care/Home Management : 8-22 mins Darleen Crocker, MS, OTR/L , CBIS ascom [phone]  07/24/21, 10:30 AM

## 2021-07-24 NOTE — NC FL2 (Signed)
[place] LEVEL OF CARE SCREENING TOOL     IDENTIFICATION  Patient Name: Barbara Frank Birthdate: Sep 24, 1945 Sex: female Admission Date (Current Location): 07/23/2021  [facility] and [place] Number:  Engineering geologist and Address:  [facility] [address] [place], [place] [address]      Provider Number: [id number]  Attending Physician Name and Address:  Ezekiel Slocumb, DO  Relative Name and Phone Number:  [name] (Niece)   [phone] [facility])    Current Level of Care: Hospital Recommended Level of Care: [place] Prior Approval Number:    Date Approved/Denied:   PASRR Number: [id number]  Discharge Plan:      Current Diagnoses: Patient Active Problem List   Diagnosis Date Noted   Fall at home, initial encounter 07/23/2021   Lung nodule 07/23/2021   Elevated lactic acid level 07/23/2021   Prolonged QT interval 07/23/2021   Atrial fibrillation, chronic (HCC)    Weakness    Palliative care encounter    Acute cystitis with hematuria    Type 2 diabetes mellitus without complication, with long-term current use of insulin (HCC)    Increased ammonia level    Encephalopathy 03/07/2021   Transient confusion 03/06/2021   Intertrigo 03/06/2021   Encounter for antineoplastic chemotherapy 01/08/2021   Encounter for antineoplastic immunotherapy 01/08/2021   Goals of care, counseling/discussion 10/17/2020   Hypercalcemia 10/17/2020   Chest mass 10/17/2020   Atrial fibrillation with RVR ([place]) 09/26/2020   Depression 09/26/2020   Diabetes type 2, uncontrolled 09/26/2020   Hypokalemia 09/26/2020   Renal cell carcinoma ([place]) 09/26/2020   Acute delirium 09/26/2020   Ureteral stone 09/26/2020    Orientation RESPIRATION BLADDER Height & Weight     Self  Normal Incontinent Weight: 161 lb (73 kg) Height:  5\' 4"  (162.6 cm)  BEHAVIORAL SYMPTOMS/MOOD NEUROLOGICAL BOWEL NUTRITION STATUS        Diet (heart  healthy/carb modified)  AMBULATORY STATUS COMMUNICATION OF NEEDS Skin   Limited Assist Verbally Normal                       Personal Care Assistance Level of Assistance  Bathing, Feeding, Dressing Bathing Assistance: Limited assistance Feeding assistance: Limited assistance Dressing Assistance: Limited assistance     Functional Limitations Info             SPECIAL CARE FACTORS FREQUENCY  PT (By licensed PT), OT (By licensed OT)     PT Frequency: 5 times per week OT Frequency: 5 times per week            Contractures      Additional Factors Info  Code Status, Allergies Code Status Info: full Allergies Info: kiwi extract           Current Medications (07/24/2021):  This is the current hospital active medication list Current Facility-Administered Medications  Medication Dose Route Frequency Provider Last Rate Last Admin   acetaminophen (TYLENOL) tablet 650 mg  650 mg Oral Q6H PRN Ivor Costa, MD       apixaban Arne [place]) tablet 5 mg  5 mg Oral BID Ivor Costa, MD   5 mg at 07/24/21 0810   diltiazem (CARDIZEM CD) 24 hr capsule 180 mg  180 mg Oral QPM Ivor Costa, MD   180 mg at 07/23/21 1526   diphenhydrAMINE (BENADRYL) injection 12.5 mg  12.5 mg Intravenous Q8H PRN Ivor Costa, MD       insulin aspart (novoLOG) injection 0-5  Units  0-5 Units Subcutaneous QHS Ivor Costa, MD   3 Units at 07/23/21 2024   insulin aspart (novoLOG) injection 0-9 Units  0-9 Units Subcutaneous TID WC Ivor Costa, MD   3 Units at 07/24/21 1220   insulin glargine-yfgn (SEMGLEE) injection 25 Units  25 Units Subcutaneous BID Nicole Kindred A, DO   25 Units at 07/24/21 1040   loratadine (CLARITIN) tablet 10 mg  10 mg Oral Daily Ivor Costa, MD   10 mg at 07/24/21 0811   metoprolol tartrate (LOPRESSOR) injection 2.5 mg  2.5 mg Intravenous Q3H PRN Ivor Costa, MD   2.5 mg at 07/23/21 1510   miconazole nitrate (MICATIN) topical powder   Topical BID Ivor Costa, MD   Given at 07/24/21 0813      Discharge Medications: Please see discharge summary for a list of discharge medications.  Relevant Imaging Results:  Relevant Lab Results:   Additional Information SS#: [id number]. From [place].  Calieb Lichtman E Dawnmarie Breon, LCSW

## 2021-07-24 NOTE — Progress Notes (Signed)
PROGRESS NOTE    Barbara Frank   BJS:[id number]  DOB: 10-08-1945  PCP: Sheral Apley    DOA: 07/23/2021 LOS: 0    Brief Narrative / Hospital Course to Date:   75 year old female with past medical history of type 2 diabetes, A. fib on Eliquis, depression, renal cell carcinoma, lung nodule who presented to the ED on 07/23/2021 after having a fall at her independent living facility.  Patient appears to have some baseline dementia and is a poor historian.  She reported feeling dizzy when she was ambulating to her bathroom and fell but did not pass out or lose consciousness.  No significant injuries reported and imaging of the head and full spine in the ED was negative for any traumatic injury including fractures or dislocations.  Patient was found to be in A. fib with RVR with rate 110s to 130s.  Admitted to [facility] service for further evaluation management.  Assessment & Plan   Principal Problem:   Atrial fibrillation with RVR (HCC) Active Problems:   Depression   Renal cell carcinoma (HCC)   Type 2 diabetes mellitus without complication, with long-term current use of insulin ([place])   Fall at home, initial encounter   Lung nodule   Elevated lactic acid level   Prolonged QT interval   A. fib with RVR -present with HR 110-130's.  Unclear trigger but possibly due to medication noncompliance.  Patient is poor historian, thinks she takes her meds as prescribed.  She was resumed on home Cardizem and heart rates been in the 90s for the most part. --Continue home Cardizem 180 mg daily --Resume home Toprol-XL 50 mg daily --Continue Eliquis --Continue IV Lopressor as needed if rate sustaining above 110 --Monitor on telemetry  Status post fall -unclear cause given patient poor historian but she did report recently having dizziness.  Fall could have been secondary to A. fib RVR. --PT and OT evaluations --SNF recommended for short-term rehab, TOC following for placement --Fall  precautions --Mobilize as tolerated  Abdominal intertrigo dermatitis -continue topical miconazole powder Monitor closely and keep skin folds dry.  Prolonged QTC -present on admission, QTC was 528 on EKG. --Lexapro on hold --Avoid QT prolonging agents as much as possible --Monitor replace electrolytes  Elevated lactic acid -POA, resolved.  Patient has no signs or symptoms of infection.   Suspect due to mild dehydration and A. fib RVR.  Patient was treated with empiric Rocephin in the ED for possible UTI but UA was negative.  Received IV fluids.  Insulin-dependent type 2 diabetes  -last A1c 8.8%, poorly controlled. Home regimen includes metformin, Lantus, NovoLog.  Compliance with medications is questionable. --Continued on home glargine 25 units daily -- Sliding scale NovoLog -- Monitor CBGs and adjust insulin for inpatient goal 140-180  History of renal cell carcinoma -follows with Dr. Tasia Catchings with oncology.  Formerly on axitinib which is apparently held due to poorly controlled diabetes and hyperglycemia.  Follow-up as outpatient with Dr. Tasia Catchings.  Depression -Lexapro on hold due to QT prolongation  Cognitive impairment, suspect dementia -baseline cognition is not clear but patient has poor recollection of recent symptoms or events. --Delirium precautions   Patient BMI: Body mass index is 27.64 kg/m.   DVT prophylaxis:  apixaban (ELIQUIS) tablet 5 mg   Diet:  Diet Orders (From admission, onward)     Start     Ordered   07/23/21 1359  Diet heart healthy/carb modified Room service appropriate? Yes; Fluid consistency: Thin  Diet effective now  Question Answer Comment  Diet-HS Snack? Nothing   Room service appropriate? Yes   Fluid consistency: Thin      07/23/21 1358              Code Status: Full Code   Subjective 07/24/21    Patient up in recliner when seen today.  She has poor recollection of her recent symptoms and events.  Does not have any pain or injury from her  fall.  She does report recently having dizziness and occasional palpitations.  She says she thinks she takes her medications as she is supposed to.  Denies any other acute complaints.   Disposition Plan & Communication   Status is: Observation  The patient remains OBS appropriate and will d/c before 2 midnights.  Requires SNF placement      Consults, Procedures, Significant Events   Consultants:  None  Procedures:  None  Antimicrobials:  Anti-infectives (From admission, onward)    Start     Dose/Rate Route Frequency Ordered Stop   07/23/21 1330  cefTRIAXone (ROCEPHIN) 1 g in sodium chloride 0.9 % 100 mL IVPB        1 g 200 mL/hr over 30 Minutes Intravenous  Once 07/23/21 1329 07/23/21 1408         Micro    Objective   Vitals:   07/23/21 2146 07/24/21 0400 07/24/21 0825 07/24/21 1212  BP: (!) 155/88 (!) 152/83 (!) 144/98 (!) 156/91  Pulse: 95 90 97 92  Resp: 18 16 18 17   Temp: 97.6 F (36.4 C) (!) 97.5 F (36.4 C) 97.8 F (36.6 C) 97.8 F (36.6 C)  TempSrc:      SpO2: 99% 100% 100% 98%  Weight:      Height:        Intake/Output Summary (Last 24 hours) at 07/24/2021 1429 Last data filed at 07/24/2021 1403 Gross per 24 hour  Intake 2956.16 ml  Output 600 ml  Net 2356.16 ml   Filed Weights   07/23/21 1143  Weight: 73 kg    Physical Exam:  General exam: awake, alert, no acute distress Respiratory system: CTAB, no wheezes, rales or rhonchi, normal respiratory effort. Cardiovascular system: normal S1/S2, irregular rhythm, rate controlled Gastrointestinal system: soft, nontender abdomen. Central nervous system: no gross focal neurologic deficits, normal speech Extremities: moves all, no edema, normal tone Skin: Erythematous abdominal fold Psychiatry: normal mood, flat affect, judgement and insight appear normal  Labs   Data Reviewed: I have personally reviewed following labs and imaging studies  CBC: Recent Labs  Lab 07/23/21 1148  WBC 6.7   NEUTROABS 5.0  HGB 15.4*  HCT 48.4*  MCV 83.0  PLT 099   Basic Metabolic Panel: Recent Labs  Lab 07/23/21 1148 07/24/21 0454  NA 136 135  K 4.1 3.6  CL 100 105  CO2 25 22  GLUCOSE 291* 254*  BUN 12 12  CREATININE 0.59 0.43*  CALCIUM 10.3 9.8   GFR: Estimated Creatinine Clearance: 60.4 mL/min (A) (by C-G formula based on SCr of 0.43 mg/dL (L)). Liver Function Tests: Recent Labs  Lab 07/23/21 1148  AST 20  ALT 12  ALKPHOS 95  BILITOT 1.8*  PROT 7.1  ALBUMIN 3.7   No results for input(s): LIPASE, AMYLASE in the last 168 hours. No results for input(s): AMMONIA in the last 168 hours. Coagulation Profile: No results for input(s): INR, PROTIME in the last 168 hours. Cardiac Enzymes: Recent Labs  Lab 07/23/21 1148  CKTOTAL 39   BNP (last  3 results) No results for input(s): PROBNP in the last 8760 hours. HbA1C: Recent Labs    07/24/21 0454  HGBA1C 10.3*   CBG: Recent Labs  Lab 07/23/21 1731 07/23/21 2014 07/24/21 0754 07/24/21 1214  GLUCAP 296* 271* 280* 290*   Lipid Profile: No results for input(s): CHOL, HDL, LDLCALC, TRIG, CHOLHDL, LDLDIRECT in the last 72 hours. Thyroid Function Tests: No results for input(s): TSH, T4TOTAL, FREET4, T3FREE, THYROIDAB in the last 72 hours. Anemia Panel: No results for input(s): VITAMINB12, FOLATE, FERRITIN, TIBC, IRON, RETICCTPCT in the last 72 hours. Sepsis Labs: Recent Labs  Lab 07/23/21 1148 07/23/21 1508 07/23/21 1736  PROCALCITON <0.10  --   --   LATICACIDVEN 2.4* 2.1* 1.8    Recent Results (from the past 240 hour(s))  Culture, blood (routine x 2)     Status: None (Preliminary result)   Collection Time: 07/23/21 11:48 AM   Specimen: BLOOD  Result Value Ref Range Status   Specimen Description BLOOD LEFT ANTECUBITAL  Final   Special Requests   Final    BOTTLES DRAWN AEROBIC AND ANAEROBIC Blood Culture results may not be optimal due to an excessive volume of blood received in culture bottles   Culture    Final    NO GROWTH < 24 HOURS Performed at [facility], [address], [place], [place] [address]    Report Status PENDING  Incomplete  Culture, blood (routine x 2)     Status: None (Preliminary result)   Collection Time: 07/23/21 12:13 PM   Specimen: BLOOD  Result Value Ref Range Status   Specimen Description BLOOD BLOOD RIGHT ARM  Final   Special Requests   Final    BOTTLES DRAWN AEROBIC AND ANAEROBIC Blood Culture adequate volume   Culture   Final    NO GROWTH < 24 HOURS Performed at [facility], [address], [place], [place] [address]    Report Status PENDING  Incomplete  Resp Panel by RT-PCR (Flu A&B, Covid) Nasopharyngeal Swab     Status: None   Collection Time: 07/23/21 12:13 PM   Specimen: Nasopharyngeal Swab; Nasopharyngeal(NP) swabs in vial transport medium  Result Value Ref Range Status   SARS Coronavirus 2 by RT PCR NEGATIVE NEGATIVE Final    Comment: (NOTE) SARS-CoV-2 target nucleic acids are NOT DETECTED.  The SARS-CoV-2 RNA is generally detectable in upper respiratory specimens during the acute phase of infection. The lowest concentration of SARS-CoV-2 viral copies this assay can detect is 138 copies/mL. A negative result does not preclude SARS-Cov-2 infection and should not be used as the sole basis for treatment or other patient management decisions. A negative result may occur with  improper specimen collection/handling, submission of specimen other than nasopharyngeal swab, presence of viral mutation(s) within the areas targeted by this assay, and inadequate number of viral copies(<138 copies/mL). A negative result must be combined with clinical observations, patient history, and epidemiological information. The expected result is Negative.  Fact Sheet for Patients:  EntrepreneurPulse.com.au  Fact Sheet for Healthcare Providers:  IncredibleEmployment.be  This test is no t yet approved or  cleared by the [place] FDA and  has been authorized for detection and/or diagnosis of SARS-CoV-2 by FDA under an Emergency Use Authorization (EUA). This EUA will remain  in effect (meaning this test can be used) for the duration of the COVID-19 declaration under Section 564(b)(1) of the Act, 21 U.S.C.section 360bbb-3(b)(1), unless the authorization is terminated  or revoked sooner.  Influenza A by PCR NEGATIVE NEGATIVE Final   Influenza B by PCR NEGATIVE NEGATIVE Final    Comment: (NOTE) The Xpert Xpress SARS-CoV-2/FLU/RSV plus assay is intended as an aid in the diagnosis of influenza from Nasopharyngeal swab specimens and should not be used as a sole basis for treatment. Nasal washings and aspirates are unacceptable for Xpert Xpress SARS-CoV-2/FLU/RSV testing.  Fact Sheet for Patients: EntrepreneurPulse.com.au  Fact Sheet for Healthcare Providers: IncredibleEmployment.be  This test is not yet approved or cleared by the [place] FDA and has been authorized for detection and/or diagnosis of SARS-CoV-2 by FDA under an Emergency Use Authorization (EUA). This EUA will remain in effect (meaning this test can be used) for the duration of the COVID-19 declaration under Section 564(b)(1) of the Act, 21 U.S.C. section 360bbb-3(b)(1), unless the authorization is terminated or revoked.  Performed at [facility], [place], [place] [address]       Imaging Studies   CT Head Wo Contrast  Result Date: 07/23/2021 CLINICAL DATA:  Fall EXAM: CT HEAD WITHOUT CONTRAST CT CERVICAL SPINE WITHOUT CONTRAST TECHNIQUE: Multidetector CT imaging of the head and cervical spine was performed following the standard protocol without intravenous contrast. Multiplanar CT image reconstructions of the cervical spine were also generated. COMPARISON:  CT head dated March 06, 2021 FINDINGS: CT HEAD FINDINGS Brain: Chronic white matter  ischemic change. No evidence of acute infarction, hemorrhage, hydrocephalus, extra-axial collection or mass lesion/mass effect. Vascular: No hyperdense vessel or unexpected calcification. Skull: Normal. Negative for fracture or focal lesion. Sinuses/Orbits: Wall thickening of the left maxillary sinus. No acute abnormality. Other: None. CT CERVICAL SPINE FINDINGS Alignment: Normal. Skull base and vertebrae: No acute fracture. No primary bone lesion or focal pathologic process. Soft tissues and spinal canal: No prevertebral fluid or swelling. No visible canal hematoma. Disc levels:  Mild multilevel degenerative disc disease. Upper chest: Negative. Other: None. IMPRESSION: 1. No acute intracranial abnormality. 2. No CT evidence of acute cervical spine injury. Electronically Signed   By: Yetta Glassman M.D.   On: 07/23/2021 12:57   CT Cervical Spine Wo Contrast  Result Date: 07/23/2021 CLINICAL DATA:  Fall EXAM: CT HEAD WITHOUT CONTRAST CT CERVICAL SPINE WITHOUT CONTRAST TECHNIQUE: Multidetector CT imaging of the head and cervical spine was performed following the standard protocol without intravenous contrast. Multiplanar CT image reconstructions of the cervical spine were also generated. COMPARISON:  CT head dated March 06, 2021 FINDINGS: CT HEAD FINDINGS Brain: Chronic white matter ischemic change. No evidence of acute infarction, hemorrhage, hydrocephalus, extra-axial collection or mass lesion/mass effect. Vascular: No hyperdense vessel or unexpected calcification. Skull: Normal. Negative for fracture or focal lesion. Sinuses/Orbits: Wall thickening of the left maxillary sinus. No acute abnormality. Other: None. CT CERVICAL SPINE FINDINGS Alignment: Normal. Skull base and vertebrae: No acute fracture. No primary bone lesion or focal pathologic process. Soft tissues and spinal canal: No prevertebral fluid or swelling. No visible canal hematoma. Disc levels:  Mild multilevel degenerative disc disease. Upper  chest: Negative. Other: None. IMPRESSION: 1. No acute intracranial abnormality. 2. No CT evidence of acute cervical spine injury. Electronically Signed   By: Yetta Glassman M.D.   On: 07/23/2021 12:57   CT Thoracic Spine Wo Contrast  Result Date: 07/23/2021 CLINICAL DATA:  Back trauma, fall, poor historian EXAM: CT THORACIC AND LUMBAR SPINE WITHOUT CONTRAST TECHNIQUE: Multidetector CT imaging of the thoracic and lumbar spine was performed without contrast. Multiplanar CT image reconstructions were also generated. COMPARISON:  No prior  CT of the thoracic and lumbar spine, correlation is made with CT chest abdomen pelvis 06/23/2021 FINDINGS: CT THORACIC SPINE FINDINGS Alignment: Dextrocurvature of the upper thoracic spine and levocurvature of the lower thoracic spine, with dextrocurvature of the lumbar spine. No listhesis. Vertebrae: Osteopenia. No acute fracture or suspicious osseous lesion. No fracture in the imaged ribs. Paraspinal and other soft tissues: Solid pulmonary nodule in the anterior right upper lobe, measuring up to 1.1 cm (series 6, image 50), which appears unchanged compared to 06/23/2021 the previously noted right middle lobe nodule is not included in the field view. No pleural effusion. No pneumothorax visualized. Large right renal lesion, incompletely imaged. Disc levels: Mild degenerative changes without significant spinal canal stenosis. CT LUMBAR SPINE FINDINGS Segmentation: 5 lumbar type vertebrae. Alignment: Dextrocurvature of the lumbar spine. Trace anterolisthesis of L4 on L5, unchanged compared to the prior exam. Vertebrae: Osteopenia.  No acute fracture or pathologic process. Paraspinal and other soft tissues: Aortic calcifications. Large right renal mass, better evaluated on 06/23/2021 and incompletely imaged here. Degenerative changes in the bilateral sacroiliac joints. Disc levels: Disc height loss and mild disc bulges, without significant spinal canal stenosis. No high-grade  neural foraminal narrowing. IMPRESSION: CT THORACIC SPINE IMPRESSION 1. No acute fracture or traumatic listhesis. 2. Redemonstrated 11 mm solid pulmonary nodule in the right upper lobe, as seen on the 06/23/2021 chest CT. A previously noted right middle lobe nodule is not included in the field of view. CT LUMBAR SPINE IMPRESSION 1. No acute fracture or traumatic listhesis. 2. Incompletely imaged right renal mass, better evaluated on 06/23/2021. Electronically Signed   By: Merilyn Baba M.D.   On: 07/23/2021 13:03   CT Lumbar Spine Wo Contrast  Result Date: 07/23/2021 CLINICAL DATA:  Back trauma, fall, poor historian EXAM: CT THORACIC AND LUMBAR SPINE WITHOUT CONTRAST TECHNIQUE: Multidetector CT imaging of the thoracic and lumbar spine was performed without contrast. Multiplanar CT image reconstructions were also generated. COMPARISON:  No prior CT of the thoracic and lumbar spine, correlation is made with CT chest abdomen pelvis 06/23/2021 FINDINGS: CT THORACIC SPINE FINDINGS Alignment: Dextrocurvature of the upper thoracic spine and levocurvature of the lower thoracic spine, with dextrocurvature of the lumbar spine. No listhesis. Vertebrae: Osteopenia. No acute fracture or suspicious osseous lesion. No fracture in the imaged ribs. Paraspinal and other soft tissues: Solid pulmonary nodule in the anterior right upper lobe, measuring up to 1.1 cm (series 6, image 50), which appears unchanged compared to 06/23/2021 the previously noted right middle lobe nodule is not included in the field view. No pleural effusion. No pneumothorax visualized. Large right renal lesion, incompletely imaged. Disc levels: Mild degenerative changes without significant spinal canal stenosis. CT LUMBAR SPINE FINDINGS Segmentation: 5 lumbar type vertebrae. Alignment: Dextrocurvature of the lumbar spine. Trace anterolisthesis of L4 on L5, unchanged compared to the prior exam. Vertebrae: Osteopenia.  No acute fracture or pathologic process.  Paraspinal and other soft tissues: Aortic calcifications. Large right renal mass, better evaluated on 06/23/2021 and incompletely imaged here. Degenerative changes in the bilateral sacroiliac joints. Disc levels: Disc height loss and mild disc bulges, without significant spinal canal stenosis. No high-grade neural foraminal narrowing. IMPRESSION: CT THORACIC SPINE IMPRESSION 1. No acute fracture or traumatic listhesis. 2. Redemonstrated 11 mm solid pulmonary nodule in the right upper lobe, as seen on the 06/23/2021 chest CT. A previously noted right middle lobe nodule is not included in the field of view. CT LUMBAR SPINE IMPRESSION 1. No acute fracture or traumatic listhesis.  2. Incompletely imaged right renal mass, better evaluated on 06/23/2021. Electronically Signed   By: Merilyn Baba M.D.   On: 07/23/2021 13:03   DG Chest Portable 1 View  Result Date: 07/23/2021 CLINICAL DATA:  Weakness. History of metastatic renal cell carcinoma. EXAM: PORTABLE CHEST 1 VIEW COMPARISON:  03/06/2021, 06/23/2021 FINDINGS: Mild cardiomegaly, stable. Atherosclerotic calcification of the aortic knob. Nodular opacity in the periphery of the right mid lung, not appreciably changed compared to the prior CT, when accounting for differences in technique. No new focal airspace consolidation. No pleural effusion or pneumothorax. IMPRESSION: 1. No acute cardiopulmonary abnormality. 2. Nodular opacity in the periphery of the right mid lung, not appreciably changed compared to the prior CT, when accounting for differences in technique. Electronically Signed   By: Davina Poke D.O.   On: 07/23/2021 13:15     Medications   Scheduled Meds:  apixaban  5 mg Oral BID   diltiazem  180 mg Oral QPM   insulin aspart  0-5 Units Subcutaneous QHS   insulin aspart  0-9 Units Subcutaneous TID WC   insulin glargine-yfgn  25 Units Subcutaneous BID   loratadine  10 mg Oral Daily   miconazole nitrate   Topical BID   Continuous  Infusions:     LOS: 0 days    Time spent: 30 minutes    Ezekiel Slocumb, DO [facility]  07/24/2021, 2:29 PM      If 7PM-7AM, please contact night-coverage. How to contact the [facility] Attending or Consulting provider [place] or covering provider during after hours [place], for this patient?    Check the care team in [facility] and look for a) attending/consulting TRH provider listed and b) the [facility] team listed Log into [email or web address] and use [place]'s universal password to access. If you do not have the password, please contact the hospital operator. Locate the [facility] provider you are looking for under [facility] and page to a number that you can be directly reached. If you still have difficulty reaching the provider, please page the [facility] (Director on Call) for the Hospitalists listed on amion for assistance.

## 2021-07-24 NOTE — TOC Initial Note (Addendum)
Transition of Care [facility]) - Initial/Assessment Note    Patient Details  Name: Barbara Frank MRN: [id number] Date of Birth: 02-13-1946  Transition of [facility]) CM/SW Contact:    Magnus Ivan, LCSW Phone Number: 07/24/2021, 1:14 PM  Clinical Narrative:                 Call to [facility] [address] at [place]. She stated they feel patient needs short term rehab prior to returning to [facility]. PT and OT have also recommended short term rehab.  Attempted call to niece [name] for [place] letter and to discuss SNF rec. Left a VM requesting a return call.  Will start SNF work up.   Expected Discharge Plan: Skilled Nursing Facility Barriers to Discharge: Continued Medical Work up   Patient Goals and CMS Choice   CMS Medicare.gov Compare Post Acute Care list provided to:: Patient Represenative (must comment) Choice offered to / list presented to : [place] / Guardian  Expected Discharge Plan and Services Expected Discharge Plan: [place]       Living arrangements for the past 2 months: [place]                                      Prior Living Arrangements/Services Living arrangements for the past 2 months: [place] Lives with:: Facility Resident          Need for Family Participation in Patient Care: Yes (Comment) Care giver support system in place?: Yes (comment)   Criminal Activity/Legal Involvement Pertinent to Current Situation/Hospitalization: No - Comment as needed  Activities of Daily Living      Permission Sought/Granted                  Emotional Assessment       Orientation: : Fluctuating Orientation (Suspected and/or reported [place]) Alcohol / Substance Use: Not Applicable Psych Involvement: No (comment)  Admission diagnosis:  Generalized weakness [R53.1] Atrial fibrillation with RVR ([place]) [I48.91] Patient Active Problem List   Diagnosis Date Noted   Fall at home, initial encounter  07/23/2021   Lung nodule 07/23/2021   Elevated lactic acid level 07/23/2021   Prolonged QT interval 07/23/2021   Atrial fibrillation, chronic (HCC)    Weakness    Palliative care encounter    Acute cystitis with hematuria    Type 2 diabetes mellitus without complication, with long-term current use of insulin (HCC)    Increased ammonia level    Encephalopathy 03/07/2021   Transient confusion 03/06/2021   Intertrigo 03/06/2021   Encounter for antineoplastic chemotherapy 01/08/2021   Encounter for antineoplastic immunotherapy 01/08/2021   Goals of care, counseling/discussion 10/17/2020   Hypercalcemia 10/17/2020   Chest mass 10/17/2020   Atrial fibrillation with RVR ([place]) 09/26/2020   Depression 09/26/2020   Diabetes type 2, uncontrolled 09/26/2020   Hypokalemia 09/26/2020   Renal cell carcinoma ([place]) 09/26/2020   Acute delirium 09/26/2020   Ureteral stone 09/26/2020   PCP:  Sheral Apley Pharmacy:   [place], [place] - [address] [place] [address] Phone: [phone] Fax: [phone]  [place] [address] Phone: [phone] Fax: [phone]     Social Determinants of Health (SDOH) Interventions    Readmission Risk Interventions No flowsheet data found.

## 2021-07-24 NOTE — Evaluation (Signed)
Physical Therapy Evaluation Patient Details Name: Barbara Frank MRN: [id number] DOB: 1946-03-24 Today's Date: 07/24/2021  History of Present Illness  Pt is a 75 y.o. female with medical history significant of diabetes mellitus, atrial fibrillation on Eliquis, falls, depression, renal cell carcinoma, and lung nodule. MD assessment includes: atrial fibrillation with RVR and falls.   Clinical Impression  Pt was pleasant and agreeable to participate during the session and put forth good effort throughout. Pt was able to orient to herself, birthday, and location but was unable to say why she was here. Pt required min guard for safety when performing bed mobility and was abel to maintain static sitting balance. Pt required mod assist with STS after an attempt to STS with no assistance. Pt reported feeling unsteady on her feet, but was able to maintain static standing balance. Pt required min assist with ambulation due to unsteadiness on feet with min cuing for RW usage. Pt was able to complete all there ex seated in the recliner but needed refocusing and prompting to continue exercises. Pt will benefit from PT services in a SNF setting upon discharge to safely address deficits listed in patient problem list for decreased caregiver assistance and eventual return to PLOF.    Recommendations for follow up therapy are one component of a multi-disciplinary discharge planning process, led by the attending physician.  Recommendations may be updated based on patient status, additional functional criteria and insurance authorization.  Follow Up Recommendations Skilled nursing-short term rehab (<3 hours/day)    Assistance Recommended at Discharge Frequent or constant Supervision/Assistance  Functional Status Assessment Patient has had a recent decline in their functional status and demonstrates the ability to make significant improvements in function in a reasonable and predictable amount of time.  Equipment  Recommendations  None recommended by PT    Recommendations for Other Services       Precautions / Restrictions Precautions Precautions: Fall Restrictions Weight Bearing Restrictions: No      Mobility  Bed Mobility Overal bed mobility: Needs Assistance Bed Mobility: Supine to Sit     Supine to sit: Min guard   General bed mobility comments: Increased time and effort, min guard for safety    Transfers Overall transfer level: Needs assistance Equipment used: Rolling walker (2 wheels) Transfers: Sit to/from Stand Sit to Stand: Mod assist           General transfer comment: Mod assist with STS with min cuing for sequencing and hand placement    Ambulation/Gait Ambulation/Gait assistance: Min assist Gait Distance (Feet): 20 Feet Assistive device: Rolling walker (2 wheels) Gait Pattern/deviations: Step-through pattern;Decreased step length - right;Decreased step length - left;Decreased stride length Gait velocity: decreased     General Gait Details: Increased time and effort, min assist for safety and unsteadiness on feet with min cuing for RW usage  Stairs            Wheelchair Mobility    Modified Rankin (Stroke Patients Only)       Balance Overall balance assessment: Needs assistance Sitting-balance support: Feet supported;Bilateral upper extremity supported Sitting balance-Leahy Scale: Fair     Standing balance support: Bilateral upper extremity supported;During functional activity Standing balance-Leahy Scale: Fair Standing balance comment: Pt reported feeling unsteady while standing but was able to maintain balance                             Pertinent Vitals/Pain Pain Assessment: No/denies pain    Home  Living Family/patient expects to be discharged to:: Other (Comment) (Independent living at [facility])                   Additional Comments: Pt unable to report, information taken from chart    Prior Function Prior  Level of Function : Patient poor historian/Family not available             Mobility Comments: Pt reported using RW for mobility ADLs Comments: Unable to report     Hand Dominance   Dominant Hand: Right    Extremity/Trunk Assessment   Upper Extremity Assessment Upper Extremity Assessment: Generalized weakness    Lower Extremity Assessment Lower Extremity Assessment: Generalized weakness       Communication   Communication: No difficulties  Cognition Arousal/Alertness: Awake/alert Behavior During Therapy: WFL for tasks assessed/performed Overall Cognitive Status: No family/caregiver present to determine baseline cognitive functioning                                 General Comments: Pt had delayed responses to questions. Pt was oriented to self, birthday, and location but could not explain why she was there.        General Comments      Exercises Total Joint Exercises Ankle Circles/Pumps: AROM;Both;10 reps;Supine Quad Sets: AROM;Both;10 reps;Supine Towel Squeeze: AROM;Both;10 reps;Supine Long Arc Quad: AROM;Both;20 reps;Seated (EOB and in recliner)   Assessment/Plan    PT Assessment Patient needs continued PT services  PT Problem List Decreased strength;Decreased activity tolerance;Decreased balance;Decreased mobility;Decreased knowledge of use of DME;Decreased safety awareness       PT Treatment Interventions DME instruction;Gait training;Stair training;Functional mobility training;Therapeutic activities;Therapeutic exercise;Balance training;Patient/family education    PT Goals (Current goals can be found in the Care Plan section)  Acute Rehab PT Goals Patient Stated Goal: to go home PT Goal Formulation: With patient Time For Goal Achievement: 08/06/21 Potential to Achieve Goals: Fair    Frequency Min 2X/week   Barriers to discharge        Co-evaluation               AM-PAC PT "6 Clicks" Mobility  Outcome Measure Help needed  turning from your back to your side while in a flat bed without using bedrails?: A Barbara Help needed moving from lying on your back to sitting on the side of a flat bed without using bedrails?: A Barbara Help needed moving to and from a bed to a chair (including a wheelchair)?: A Lot Help needed standing up from a chair using your arms (e.g., wheelchair or bedside chair)?: A Lot Help needed to walk in hospital room?: A Barbara Help needed climbing 3-5 steps with a railing? : A Lot 6 Click Score: 15    End of Session Equipment Utilized During Treatment: Gait belt Activity Tolerance: Patient tolerated treatment well Patient left: in chair;with call bell/phone within reach;with chair alarm set Nurse Communication: Mobility status PT Visit Diagnosis: Unsteadiness on feet (R26.81);Difficulty in walking, not elsewhere classified (R26.2);Muscle weakness (generalized) (M62.81)    Time: 2025-4270 PT Time Calculation (min) (ACUTE ONLY): 25 min   Charges:   PT Evaluation $PT Eval Moderate Complexity: 1 Mod PT Treatments $Therapeutic Exercise: 8-22 mins        Sheldon Silvan SPT 07/24/21, 1:47 PM

## 2021-07-25 DIAGNOSIS — I4891 Unspecified atrial fibrillation: Secondary | ICD-10-CM | POA: Diagnosis not present

## 2021-07-25 LAB — BASIC METABOLIC PANEL
Anion gap: 7 (ref 5–15)
BUN: 9 mg/dL (ref 8–23)
CO2: 25 mmol/L (ref 22–32)
Calcium: 10.1 mg/dL (ref 8.9–10.3)
Chloride: 104 mmol/L (ref 98–111)
Creatinine, Ser: 0.32 mg/dL — ABNORMAL LOW (ref 0.44–1.00)
GFR, Estimated: >60 mL/min (ref >60)
Glucose, Bld: 192 mg/dL — ABNORMAL HIGH (ref 70–99)
Potassium: 3.4 mmol/L — ABNORMAL LOW (ref 3.5–5.1)
Sodium: 136 mmol/L (ref 135–145)

## 2021-07-25 LAB — GLUCOSE, CAPILLARY
Glucose-Capillary: 218 mg/dL — ABNORMAL HIGH (ref 70–99)
Glucose-Capillary: 249 mg/dL — ABNORMAL HIGH (ref 70–99)
Glucose-Capillary: 266 mg/dL — ABNORMAL HIGH (ref 70–99)
Glucose-Capillary: 178 mg/dL — ABNORMAL HIGH (ref 70–99)
Glucose-Capillary: 160 mg/dL — ABNORMAL HIGH (ref 70–99)

## 2021-07-25 LAB — CBC
HCT: 41.7 % (ref 36.0–46.0)
Hemoglobin: 13.4 g/dL (ref 12.0–15.0)
MCH: 26.1 pg (ref 26.0–34.0)
MCHC: 32.1 g/dL (ref 30.0–36.0)
MCV: 81.1 fL (ref 80.0–100.0)
Platelets: 167 10*3/uL (ref 150–400)
RBC: 5.14 MIL/uL — ABNORMAL HIGH (ref 3.87–5.11)
RDW: 14.4 % (ref 11.5–15.5)
WBC: 6.8 10*3/uL (ref 4.0–10.5)
nRBC: 0 % (ref 0.0–0.2)

## 2021-07-25 MED ORDER — POTASSIUM CHLORIDE 20 MEQ PO PACK
40.0000 meq | PACK | Freq: Once | ORAL | Status: AC
  Administered 2021-07-25: 40 meq via ORAL
  Filled 2021-07-25: qty 2

## 2021-07-25 MED ORDER — INSULIN ASPART 100 UNIT/ML IJ SOLN
3.0000 [IU] | Freq: Three times a day (TID) | INTRAMUSCULAR | Status: DC
  Administered 2021-07-27 – 2021-07-31 (×14): 3 [IU] via SUBCUTANEOUS
  Filled 2021-07-25 (×15): qty 1

## 2021-07-25 MED ORDER — DILTIAZEM HCL ER COATED BEADS 120 MG PO CP24
240.0000 mg | ORAL_CAPSULE | Freq: Every evening | ORAL | Status: DC
  Administered 2021-07-25 – 2021-07-30 (×6): 240 mg via ORAL
  Filled 2021-07-25 (×6): qty 2

## 2021-07-25 MED ORDER — POTASSIUM CHLORIDE CRYS ER 20 MEQ PO TBCR: 40.0000 meq | EXTENDED_RELEASE_TABLET | Freq: Once | ORAL | Status: DC

## 2021-07-25 NOTE — Progress Notes (Signed)
PROGRESS NOTE    Barbara Frank   PFX:[id number]  DOB: 1946/05/04  PCP: Sheral Apley    DOA: 07/23/2021 LOS: 0    Brief Narrative / Hospital Course to Date:   75 year old female with past medical history of type 2 diabetes, A. fib on Eliquis, depression, renal cell carcinoma, lung nodule who presented to the ED on 07/23/2021 after having a fall at her independent living facility.  Patient appears to have some baseline dementia and is a poor historian.  She reported feeling dizzy when she was ambulating to her bathroom and fell but did not pass out or lose consciousness.  No significant injuries reported and imaging of the head and full spine in the ED was negative for any traumatic injury including fractures or dislocations.  Patient was found to be in A. fib with RVR with rate 110s to 130s.  Admitted to [facility] service for further evaluation management.  Assessment & Plan   Principal Problem:   Atrial fibrillation with RVR (HCC) Active Problems:   Depression   Renal cell carcinoma (HCC)   Type 2 diabetes mellitus without complication, with long-term current use of insulin ([place])   Fall at home, initial encounter   Lung nodule   Elevated lactic acid level   Prolonged QT interval   A. fib with RVR -present with HR 110-130's.  Unclear trigger but possibly due to medication noncompliance.  Patient is poor historian, thinks she takes her meds as prescribed.  She was resumed on home Cardizem and heart rates been in the 90s for the most part. --Continue home Cardizem 180 mg daily >> increase to 240 mg (for HR and BP) --Resume home Toprol-XL 50 mg daily --Continue Eliquis --Continue IV Lopressor as needed if rate sustaining above 110 --Monitor on telemetry  Hypertension -on Cardizem and Toprol as above.  Cardizem dose increased to 240 mg due to uncontrolled BP with systolic above 329 overnight.  Status post fall -unclear cause given patient poor historian but she did report  recently having dizziness.  Fall could have been secondary to A. fib RVR. --PT and OT evaluations --SNF recommended for short-term rehab, TOC following for placement --Fall precautions --Mobilize as tolerated  Abdominal intertrigo dermatitis -continue topical miconazole powder Monitor closely and keep skin folds dry.  Prolonged QTC -present on admission, QTC was 528 on EKG. --Lexapro on hold --Avoid QT prolonging agents as much as possible --Monitor replace electrolytes  Elevated lactic acid -POA, resolved.  Patient has no signs or symptoms of infection.   Suspect due to mild dehydration and A. fib RVR.  Patient was treated with empiric Rocephin in the ED for possible UTI but UA was negative.  Received IV fluids.  Insulin-dependent type 2 diabetes  -last A1c 8.8%, poorly controlled. Home regimen includes metformin, Lantus, NovoLog.  Compliance with medications is questionable. --Continued on home glargine 25 units daily --Add 3 units NovoLog 3 times daily with meals --Continue sliding scale NovoLog -- Monitor CBGs and adjust insulin for inpatient goal 140-180  History of renal cell carcinoma -follows with Dr. Tasia Catchings with oncology.  Formerly on axitinib which is apparently held due to poorly controlled diabetes and hyperglycemia.  Follow-up as outpatient with Dr. Tasia Catchings.  Depression -Lexapro on hold due to QT prolongation  Cognitive impairment, suspect dementia -baseline cognition is not clear but patient has poor recollection of recent symptoms or events. --Delirium precautions   Patient BMI: Body mass index is 27.64 kg/m.   DVT prophylaxis:  apixaban (ELIQUIS) tablet  5 mg   Diet:  Diet Orders (From admission, onward)     Start     Ordered   07/23/21 1359  Diet heart healthy/carb modified Room service appropriate? Yes; Fluid consistency: Thin  Diet effective now       Question Answer Comment  Diet-HS Snack? Nothing   Room service appropriate? Yes   Fluid consistency: Thin       07/23/21 1358              Code Status: Full Code   Subjective 07/25/21    Patient was awake sitting up in bed when seen this morning.  She says she cannot tell how she is feeling because she needs to get up out of bed first.  Says she thinks she slept okay last night.  Denies any acute complaints no acute events reported overnight.   Disposition Plan & Communication   Status is: Observation  The patient remains OBS appropriate and will d/c before 2 midnights.  Requires SNF placement      Consults, Procedures, Significant Events   Consultants:  None  Procedures:  None  Antimicrobials:  Anti-infectives (From admission, onward)    Start     Dose/Rate Route Frequency Ordered Stop   07/23/21 1330  cefTRIAXone (ROCEPHIN) 1 g in sodium chloride 0.9 % 100 mL IVPB        1 g 200 mL/hr over 30 Minutes Intravenous  Once 07/23/21 1329 07/23/21 1408         Micro    Objective   Vitals:   07/25/21 0007 07/25/21 0420 07/25/21 0812 07/25/21 1123  BP: (!) 166/92 (!) 171/91 137/82 (!) 160/80  Pulse: 77 85 70 67  Resp: 18 18 17 17   Temp: 97.7 F (36.5 C) 98.2 F (36.8 C) 98 F (36.7 C) 97.8 F (36.6 C)  TempSrc:   Oral   SpO2: 98% 97% 98% 99%  Weight:      Height:        Intake/Output Summary (Last 24 hours) at 07/25/2021 1421 Last data filed at 07/25/2021 1124 Gross per 24 hour  Intake 720 ml  Output 800 ml  Net -80 ml   Filed Weights   07/23/21 1143  Weight: 73 kg    Physical Exam:  General exam: awake sitting up in bed, alert, no acute distress Respiratory system: Lungs clear bilaterally, on room air, normal respiratory effort Cardiovascular system: Irregular rhythm, regular rate Central nervous system: no gross focal neurologic deficits, normal speech Extremities: moves all, no edema, normal tone Psychiatry: normal mood, flat affect, slow responses seemingly due to baseline cognitive impairment  Labs   Data Reviewed: I have personally reviewed  following labs and imaging studies  CBC: Recent Labs  Lab 07/23/21 1148 07/25/21 0554  WBC 6.7 6.8  NEUTROABS 5.0  --   HGB 15.4* 13.4  HCT 48.4* 41.7  MCV 83.0 81.1  PLT 173 174   Basic Metabolic Panel: Recent Labs  Lab 07/23/21 1148 07/24/21 0454 07/25/21 0554  NA 136 135 136  K 4.1 3.6 3.4*  CL 100 105 104  CO2 25 22 25   GLUCOSE 291* 254* 192*  BUN 12 12 9   CREATININE 0.59 0.43* 0.32*  CALCIUM 10.3 9.8 10.1   GFR: Estimated Creatinine Clearance: 60.4 mL/min (A) (by C-G formula based on SCr of 0.32 mg/dL (L)). Liver Function Tests: Recent Labs  Lab 07/23/21 1148  AST 20  ALT 12  ALKPHOS 95  BILITOT 1.8*  PROT 7.1  ALBUMIN  3.7   No results for input(s): LIPASE, AMYLASE in the last 168 hours. No results for input(s): AMMONIA in the last 168 hours. Coagulation Profile: No results for input(s): INR, PROTIME in the last 168 hours. Cardiac Enzymes: Recent Labs  Lab 07/23/21 1148  CKTOTAL 39   BNP (last 3 results) No results for input(s): PROBNP in the last 8760 hours. HbA1C: Recent Labs    07/24/21 0454  HGBA1C 10.3*   CBG: Recent Labs  Lab 07/24/21 1214 07/24/21 1529 07/24/21 2217 07/25/21 0811 07/25/21 1123  GLUCAP 290* 200* 219* 218* 249*   Lipid Profile: No results for input(s): CHOL, HDL, LDLCALC, TRIG, CHOLHDL, LDLDIRECT in the last 72 hours. Thyroid Function Tests: No results for input(s): TSH, T4TOTAL, FREET4, T3FREE, THYROIDAB in the last 72 hours. Anemia Panel: No results for input(s): VITAMINB12, FOLATE, FERRITIN, TIBC, IRON, RETICCTPCT in the last 72 hours. Sepsis Labs: Recent Labs  Lab 07/23/21 1148 07/23/21 1508 07/23/21 1736  PROCALCITON <0.10  --   --   LATICACIDVEN 2.4* 2.1* 1.8    Recent Results (from the past 240 hour(s))  Culture, blood (routine x 2)     Status: None (Preliminary result)   Collection Time: 07/23/21 11:48 AM   Specimen: BLOOD  Result Value Ref Range Status   Specimen Description BLOOD LEFT  ANTECUBITAL  Final   Special Requests   Final    BOTTLES DRAWN AEROBIC AND ANAEROBIC Blood Culture results may not be optimal due to an excessive volume of blood received in culture bottles   Culture   Final    NO GROWTH 2 DAYS Performed at [facility], [address], [place], [place] [address]    Report Status PENDING  Incomplete  Culture, blood (routine x 2)     Status: None (Preliminary result)   Collection Time: 07/23/21 12:13 PM   Specimen: BLOOD  Result Value Ref Range Status   Specimen Description BLOOD BLOOD RIGHT ARM  Final   Special Requests   Final    BOTTLES DRAWN AEROBIC AND ANAEROBIC Blood Culture adequate volume   Culture   Final    NO GROWTH 2 DAYS Performed at [facility], [address], [place], [place] [address]    Report Status PENDING  Incomplete  Resp Panel by RT-PCR (Flu A&B, Covid) Nasopharyngeal Swab     Status: None   Collection Time: 07/23/21 12:13 PM   Specimen: Nasopharyngeal Swab; Nasopharyngeal(NP) swabs in vial transport medium  Result Value Ref Range Status   SARS Coronavirus 2 by RT PCR NEGATIVE NEGATIVE Final    Comment: (NOTE) SARS-CoV-2 target nucleic acids are NOT DETECTED.  The SARS-CoV-2 RNA is generally detectable in upper respiratory specimens during the acute phase of infection. The lowest concentration of SARS-CoV-2 viral copies this assay can detect is 138 copies/mL. A negative result does not preclude SARS-Cov-2 infection and should not be used as the sole basis for treatment or other patient management decisions. A negative result may occur with  improper specimen collection/handling, submission of specimen other than nasopharyngeal swab, presence of viral mutation(s) within the areas targeted by this assay, and inadequate number of viral copies(<138 copies/mL). A negative result must be combined with clinical observations, patient history, and epidemiological information. The expected result is  Negative.  Fact Sheet for Patients:  EntrepreneurPulse.com.au  Fact Sheet for Healthcare Providers:  IncredibleEmployment.be  This test is no t yet approved or cleared by the [place] FDA and  has been authorized for detection and/or diagnosis  of SARS-CoV-2 by FDA under an Emergency Use Authorization (EUA). This EUA will remain  in effect (meaning this test can be used) for the duration of the COVID-19 declaration under Section 564(b)(1) of the Act, 21 U.S.C.section 360bbb-3(b)(1), unless the authorization is terminated  or revoked sooner.       Influenza A by PCR NEGATIVE NEGATIVE Final   Influenza B by PCR NEGATIVE NEGATIVE Final    Comment: (NOTE) The Xpert Xpress SARS-CoV-2/FLU/RSV plus assay is intended as an aid in the diagnosis of influenza from Nasopharyngeal swab specimens and should not be used as a sole basis for treatment. Nasal washings and aspirates are unacceptable for Xpert Xpress SARS-CoV-2/FLU/RSV testing.  Fact Sheet for Patients: EntrepreneurPulse.com.au  Fact Sheet for Healthcare Providers: IncredibleEmployment.be  This test is not yet approved or cleared by the [place] FDA and has been authorized for detection and/or diagnosis of SARS-CoV-2 by FDA under an Emergency Use Authorization (EUA). This EUA will remain in effect (meaning this test can be used) for the duration of the COVID-19 declaration under Section 564(b)(1) of the Act, 21 U.S.C. section 360bbb-3(b)(1), unless the authorization is terminated or revoked.  Performed at [facility], [address], [place], [place] [address]       Imaging Studies   No results found.   Medications   Scheduled Meds:  apixaban  5 mg Oral BID   diltiazem  240 mg Oral QPM   insulin aspart  0-5 Units Subcutaneous QHS   insulin aspart  0-9 Units Subcutaneous TID WC   insulin glargine-yfgn  25 Units Subcutaneous  BID   loratadine  10 mg Oral Daily   metoprolol succinate  50 mg Oral Daily   miconazole nitrate   Topical BID   multivitamin with minerals  1 tablet Oral Daily   Continuous Infusions:     LOS: 0 days    Time spent: 25 minutes with > 50% spent at bedside and in coordination of care    Ezekiel Slocumb, DO [facility]  07/25/2021, 2:21 PM      If 7PM-7AM, please contact night-coverage. How to contact the [facility] Attending or Consulting provider [place] or covering provider during after hours [place], for this patient?    Check the care team in [facility] and look for a) attending/consulting TR[facility] provider listed and b) the [facility] team listed Log into [email or web address] and use [place]'s universal password to access. If you do not have the password, please contact the hospital operator. Locate the [facility] provider you are looking for under [facility] and page to a number that you can be directly reached. If you still have difficulty reaching the provider, please page the [facility] (Director on Call) for the Hospitalists listed on amion for assistance.

## 2021-07-25 NOTE — Progress Notes (Signed)
Mobility Specialist - Progress Note   07/25/21 1143  Mobility  Activity Transferred:  Bed to chair  Level of Assistance Minimal assist, patient does 75% or more  Assistive Device Front wheel walker  Distance Ambulated (ft) 4 ft  Mobility Out of bed to chair with meals  Mobility Response Tolerated well  Mobility performed by Mobility specialist  $Mobility charge 1 Mobility    Pre-mobility: 77 HR, 93% SpO2 Post-mobility: 78 HR, 96% SpO2   Pt lying in bed upon arrival, utilizing RA. Voiced pain in L ear extending to side of neck. Sat EOB without assist, dizzy upon standing. MinA to stand with cues for hand placement. Posterior lean. Pt left in recliner with alarm set.    Kathee Delton Mobility Specialist 07/25/21, 11:47 AM

## 2021-07-25 NOTE — Progress Notes (Signed)
Mobility Specialist - Progress Note   07/25/21 1400  Mobility  Activity Transferred:  Chair to bed  Level of Assistance Minimal assist, patient does 75% or more  Assistive Device Front wheel walker  Distance Ambulated (ft) 4 ft  Mobility Sit up in bed/chair position for meals  Mobility Response Tolerated well  Mobility performed by Mobility specialist  $Mobility charge 1 Mobility    Pt returned to bed. Complained of soreness in L shoulder. RN notified.    Kathee Delton Mobility Specialist 07/25/21, 2:05 PM

## 2021-07-26 DIAGNOSIS — I4891 Unspecified atrial fibrillation: Secondary | ICD-10-CM | POA: Diagnosis not present

## 2021-07-26 LAB — BASIC METABOLIC PANEL
Anion gap: 6 (ref 5–15)
BUN: 13 mg/dL (ref 8–23)
CO2: 29 mmol/L (ref 22–32)
Calcium: 10.4 mg/dL — ABNORMAL HIGH (ref 8.9–10.3)
Chloride: 104 mmol/L (ref 98–111)
Creatinine, Ser: 0.46 mg/dL (ref 0.44–1.00)
GFR, Estimated: >60 mL/min (ref >60)
Glucose, Bld: 191 mg/dL — ABNORMAL HIGH (ref 70–99)
Potassium: 3.8 mmol/L (ref 3.5–5.1)
Sodium: 139 mmol/L (ref 135–145)

## 2021-07-26 LAB — GLUCOSE, CAPILLARY
Glucose-Capillary: 206 mg/dL — ABNORMAL HIGH (ref 70–99)
Glucose-Capillary: 246 mg/dL — ABNORMAL HIGH (ref 70–99)
Glucose-Capillary: 226 mg/dL — ABNORMAL HIGH (ref 70–99)
Glucose-Capillary: 159 mg/dL — ABNORMAL HIGH (ref 70–99)

## 2021-07-26 LAB — MAGNESIUM: Magnesium: 1.6 mg/dL — ABNORMAL LOW (ref 1.7–2.4)

## 2021-07-26 MED ORDER — MAGNESIUM SULFATE 2 GM/50ML IV SOLN
2.0000 g | Freq: Once | INTRAVENOUS | Status: AC
  Administered 2021-07-26: 2 g via INTRAVENOUS
  Filled 2021-07-26: qty 50

## 2021-07-26 NOTE — Progress Notes (Signed)
PROGRESS NOTE    Barbara Frank   HAL:[id number]  DOB: 1946/08/30  PCP: Sheral Apley    DOA: 07/23/2021 LOS: 0    Brief Narrative / Hospital Course to Date:   75 year old female with past medical history of type 2 diabetes, A. fib on Eliquis, depression, renal cell carcinoma, lung nodule who presented to the ED on 07/23/2021 after having a fall at her independent living facility.  Patient appears to have some baseline dementia and is a poor historian.  She reported feeling dizzy when she was ambulating to her bathroom and fell but did not pass out or lose consciousness.  No significant injuries reported and imaging of the head and full spine in the ED was negative for any traumatic injury including fractures or dislocations.  Patient was found to be in A. fib with RVR with rate 110s to 130s.  Admitted to [facility] service for further evaluation management.  Assessment & Plan   Principal Problem:   Atrial fibrillation with RVR (HCC) Active Problems:   Depression   Renal cell carcinoma (HCC)   Type 2 diabetes mellitus without complication, with long-term current use of insulin ([place])   Fall at home, initial encounter   Lung nodule   Elevated lactic acid level   Prolonged QT interval   A. fib with RVR -present with HR 110-130's.  Unclear trigger but possibly due to medication noncompliance.  Patient is poor historian, thinks she takes her meds as prescribed.  She was resumed on home Cardizem and heart rates been in the 90s for the most part. --Continue home Cardizem, increase to 240 mg (for HR and BP) --Resume home Toprol-XL 50 mg daily --Continue Eliquis --Continue IV Lopressor as needed if rate sustaining above 110 --Monitor on telemetry  Delirium on ?underlying cognitive impairment.  Pt seems to be developing hospital delirium.  Trying repeatedly to get out of bed. --delirium precautions --sitter for safety --monitor closely for s/sx's of infection, monitor electrolytes,  renal function --monitor neurologic status --further work up as indicated  Hypomagnesemia - replacing 2 g IV Mg-sulfate for Mg level 1.6 (11/20).  Hypertension -on Cardizem and Toprol as above.  Cardizem dose increased to 240 mg due to uncontrolled BP with systolic above 735 overnight.  Status post fall -unclear cause given patient poor historian but she did report recently having dizziness.  Fall could have been secondary to A. fib RVR. --PT and OT evaluations --SNF recommended for short-term rehab, TOC following for placement --Fall precautions --Mobilize as tolerated  Abdominal intertrigo dermatitis -continue topical miconazole powder Monitor closely and keep skin folds dry.  Prolonged QTC -present on admission, QTC was 528 on EKG. --Lexapro on hold --Avoid QT prolonging agents as much as possible --Monitor replace electrolytes  Elevated lactic acid -POA, resolved.  Patient has no signs or symptoms of infection.   Suspect due to mild dehydration and A. fib RVR.  Patient was treated with empiric Rocephin in the ED for possible UTI but UA was negative.  Received IV fluids.  Insulin-dependent type 2 diabetes  -last A1c 8.8%, poorly controlled. Home regimen includes metformin, Lantus, NovoLog.  Compliance with medications is questionable. --Continued on home glargine 25 units daily --Add 3 units NovoLog 3 times daily with meals --Continue sliding scale NovoLog -- Monitor CBGs and adjust insulin for inpatient goal 140-180  History of renal cell carcinoma -follows with Dr. Tasia Catchings with oncology.  Formerly on axitinib which is apparently held due to poorly controlled diabetes and hyperglycemia.  Follow-up  as outpatient with Dr. Tasia Catchings.  Depression -Lexapro on hold due to QT prolongation  Cognitive impairment, suspect dementia -baseline cognition is not clear but patient has poor recollection of recent symptoms or events. --Delirium precautions   Patient BMI: Body mass index is 27.64 kg/m.    DVT prophylaxis:  apixaban (ELIQUIS) tablet 5 mg   Diet:  Diet Orders (From admission, onward)     Start     Ordered   07/23/21 1359  Diet heart healthy/carb modified Room service appropriate? Yes; Fluid consistency: Thin  Diet effective now       Question Answer Comment  Diet-HS Snack? Nothing   Room service appropriate? Yes   Fluid consistency: Thin      07/23/21 1358              Code Status: Full Code   Subjective 07/26/21    Patient sleeping but woke easily.  She does not interact with me today, non-verbal.  Does follow commands appropriately. Unable to elicit acute complaints during my encounter.  This afternoon, RN notified me patient trying to get out of bed, more confused today.  Sitter for safety.   Disposition Plan & Communication   Status is: Observation  The patient remains OBS appropriate and will d/c before 2 midnights. Remains admitted because requires SNF placement.      Consults, Procedures, Significant Events   Consultants:  None  Procedures:  None  Antimicrobials:  Anti-infectives (From admission, onward)    Start     Dose/Rate Route Frequency Ordered Stop   07/23/21 1330  cefTRIAXone (ROCEPHIN) 1 g in sodium chloride 0.9 % 100 mL IVPB        1 g 200 mL/hr over 30 Minutes Intravenous  Once 07/23/21 1329 07/23/21 1408         Micro    Objective   Vitals:   07/26/21 0404 07/26/21 0724 07/26/21 1159 07/26/21 1543  BP: (!) 148/91 (!) 155/93 (!) 152/71 (!) 136/49  Pulse: 84 88 89 79  Resp: 16 17 17 17   Temp: 98.4 F (36.9 C) 98.6 F (37 C) 98 F (36.7 C) 98.2 F (36.8 C)  TempSrc: Oral  Axillary Axillary  SpO2: 99% 100% 98% 96%  Weight:      Height:        Intake/Output Summary (Last 24 hours) at 07/26/2021 1622 Last data filed at 07/26/2021 1547 Gross per 24 hour  Intake 610 ml  Output 900 ml  Net -290 ml   Filed Weights   07/23/21 1143  Weight: 73 kg    Physical Exam:  General exam: awake sitting up in  bed, alert, no acute distress, not very interactive, non-verbal Respiratory system: normal respiratory effort at rest, on room air Cardiovascular system: Irregular rhythm, regular rate, no peripheral edema Central nervous system: no gross focal neurologic deficits, normal speech Extremities: moves all, no cyanosis, normal tone Psychiatry: normal mood, flat affect, non-verbal today  Labs   Data Reviewed: I have personally reviewed following labs and imaging studies  CBC: Recent Labs  Lab 07/23/21 1148 07/25/21 0554  WBC 6.7 6.8  NEUTROABS 5.0  --   HGB 15.4* 13.4  HCT 48.4* 41.7  MCV 83.0 81.1  PLT 173 277   Basic Metabolic Panel: Recent Labs  Lab 07/23/21 1148 07/24/21 0454 07/25/21 0554 07/26/21 0459  NA 136 135 136 139  K 4.1 3.6 3.4* 3.8  CL 100 105 104 104  CO2 25 22 25 29   GLUCOSE 291* 254* 192*  191*  BUN 12 12 9 13   CREATININE 0.59 0.43* 0.32* 0.46  CALCIUM 10.3 9.8 10.1 10.4*  MG  --   --   --  1.6*   GFR: Estimated Creatinine Clearance: 60.4 mL/min (by C-G formula based on SCr of 0.46 mg/dL). Liver Function Tests: Recent Labs  Lab 07/23/21 1148  AST 20  ALT 12  ALKPHOS 95  BILITOT 1.8*  PROT 7.1  ALBUMIN 3.7   No results for input(s): LIPASE, AMYLASE in the last 168 hours. No results for input(s): AMMONIA in the last 168 hours. Coagulation Profile: No results for input(s): INR, PROTIME in the last 168 hours. Cardiac Enzymes: Recent Labs  Lab 07/23/21 1148  CKTOTAL 39   BNP (last 3 results) No results for input(s): PROBNP in the last 8760 hours. HbA1C: Recent Labs    07/24/21 0454  HGBA1C 10.3*   CBG: Recent Labs  Lab 07/25/21 1627 07/25/21 2040 07/25/21 2213 07/26/21 0725 07/26/21 1202  GLUCAP 266* 178* 160* 206* 246*   Lipid Profile: No results for input(s): CHOL, HDL, LDLCALC, TRIG, CHOLHDL, LDLDIRECT in the last 72 hours. Thyroid Function Tests: No results for input(s): TSH, T4TOTAL, FREET4, T3FREE, THYROIDAB in the last 72  hours. Anemia Panel: No results for input(s): VITAMINB12, FOLATE, FERRITIN, TIBC, IRON, RETICCTPCT in the last 72 hours. Sepsis Labs: Recent Labs  Lab 07/23/21 1148 07/23/21 1508 07/23/21 1736  PROCALCITON <0.10  --   --   LATICACIDVEN 2.4* 2.1* 1.8    Recent Results (from the past 240 hour(s))  Culture, blood (routine x 2)     Status: None (Preliminary result)   Collection Time: 07/23/21 11:48 AM   Specimen: BLOOD  Result Value Ref Range Status   Specimen Description BLOOD LEFT ANTECUBITAL  Final   Special Requests   Final    BOTTLES DRAWN AEROBIC AND ANAEROBIC Blood Culture results may not be optimal due to an excessive volume of blood received in culture bottles   Culture   Final    NO GROWTH 3 DAYS Performed at [facility], [address], [place], [place] [address]    Report Status PENDING  Incomplete  Culture, blood (routine x 2)     Status: None (Preliminary result)   Collection Time: 07/23/21 12:13 PM   Specimen: BLOOD  Result Value Ref Range Status   Specimen Description BLOOD BLOOD RIGHT ARM  Final   Special Requests   Final    BOTTLES DRAWN AEROBIC AND ANAEROBIC Blood Culture adequate volume   Culture   Final    NO GROWTH 3 DAYS Performed at [facility], [address], [place], [place] [address]    Report Status PENDING  Incomplete  Resp Panel by RT-PCR (Flu A&B, Covid) Nasopharyngeal Swab     Status: None   Collection Time: 07/23/21 12:13 PM   Specimen: Nasopharyngeal Swab; Nasopharyngeal(NP) swabs in vial transport medium  Result Value Ref Range Status   SARS Coronavirus 2 by RT PCR NEGATIVE NEGATIVE Final    Comment: (NOTE) SARS-CoV-2 target nucleic acids are NOT DETECTED.  The SARS-CoV-2 RNA is generally detectable in upper respiratory specimens during the acute phase of infection. The lowest concentration of SARS-CoV-2 viral copies this assay can detect is 138 copies/mL. A negative result does not preclude SARS-Cov-2 infection  and should not be used as the sole basis for treatment or other patient management decisions. A negative result may occur with  improper specimen collection/handling, submission of specimen other than nasopharyngeal swab, presence of  viral mutation(s) within the areas targeted by this assay, and inadequate number of viral copies(<138 copies/mL). A negative result must be combined with clinical observations, patient history, and epidemiological information. The expected result is Negative.  Fact Sheet for Patients:  EntrepreneurPulse.com.au  Fact Sheet for Healthcare Providers:  IncredibleEmployment.be  This test is no t yet approved or cleared by the [place] FDA and  has been authorized for detection and/or diagnosis of SARS-CoV-2 by FDA under an Emergency Use Authorization (EUA). This EUA will remain  in effect (meaning this test can be used) for the duration of the COVID-19 declaration under Section 564(b)(1) of the Act, 21 U.S.C.section 360bbb-3(b)(1), unless the authorization is terminated  or revoked sooner.       Influenza A by PCR NEGATIVE NEGATIVE Final   Influenza B by PCR NEGATIVE NEGATIVE Final    Comment: (NOTE) The Xpert Xpress SARS-CoV-2/FLU/RSV plus assay is intended as an aid in the diagnosis of influenza from Nasopharyngeal swab specimens and should not be used as a sole basis for treatment. Nasal washings and aspirates are unacceptable for Xpert Xpress SARS-CoV-2/FLU/RSV testing.  Fact Sheet for Patients: EntrepreneurPulse.com.au  Fact Sheet for Healthcare Providers: IncredibleEmployment.be  This test is not yet approved or cleared by the [place] FDA and has been authorized for detection and/or diagnosis of SARS-CoV-2 by FDA under an Emergency Use Authorization (EUA). This EUA will remain in effect (meaning this test can be used) for the duration of the COVID-19 declaration  under Section 564(b)(1) of the Act, 21 U.S.C. section 360bbb-3(b)(1), unless the authorization is terminated or revoked.  Performed at [facility], [address] [place], [place] [address]       Imaging Studies   No results found.   Medications   Scheduled Meds:  apixaban  5 mg Oral BID   diltiazem  240 mg Oral QPM   insulin aspart  0-5 Units Subcutaneous QHS   insulin aspart  0-9 Units Subcutaneous TID WC   insulin aspart  3 Units Subcutaneous TID WC   insulin glargine-yfgn  25 Units Subcutaneous BID   loratadine  10 mg Oral Daily   metoprolol succinate  50 mg Oral Daily   miconazole nitrate   Topical BID   multivitamin with minerals  1 tablet Oral Daily   Continuous Infusions:     LOS: 0 days    Time spent: 25 minutes with > 50% spent at bedside and in coordination of care    Ezekiel Slocumb, DO [facility]  07/26/2021, 4:22 PM      If 7PM-7AM, please contact night-coverage. How to contact the [facility] Attending or Consulting provider [place] or covering provider during after hours Welcome, for this patient?    Check the care team in [facility] and look for a) attending/consulting TRH provider listed and b) the [facility] team listed Log into [email or web address] and use [place]'s universal password to access. If you do not have the password, please contact the hospital operator. Locate the [facility] provider you are looking for under [facility] and page to a number that you can be directly reached. If you still have difficulty reaching the provider, please page the [facility] (Director on Call) for the Hospitalists listed on amion for assistance.

## 2021-07-26 NOTE — Plan of Care (Signed)
  Problem: Education: Goal: Knowledge of disease or condition will improve Outcome: Not Progressing Goal: Understanding of medication regimen will improve Outcome: Not Progressing Goal: Individualized Educational Video(s) Outcome: Not Progressing   COGNITIVE BARRIERS TO EDUCATION

## 2021-07-26 NOTE — Care Management Obs Status (Signed)
[place] NOTIFICATION   Patient Details  Name: Barbara Frank MRN: [id number] Date of Birth: 1946/07/15   Medicare Observation Status Notification Given:  No (Unable to reach niece by phone. Patient only oriented to self.)    Candie Chroman, LCSW 07/26/2021, 12:09 PM

## 2021-07-26 NOTE — TOC Progression Note (Signed)
Transition of Care [facility]) - Progression Note    Patient Details  Name: Barbara Frank MRN: [id number] Date of Birth: [birth year]  Transition of Care [facility]) CM/SW [place], LCSW Phone Number: 07/26/2021, 12:09 PM  Clinical Narrative:   Left voicemail for niece.  Expected Discharge Plan: [place] Barriers to Discharge: Continued Medical Work up  Expected Discharge Plan and Services Expected Discharge Plan: [place] arrangements for the past 2 months: [place]                                       Social Determinants of Health (SDOH) Interventions    Readmission Risk Interventions No flowsheet data found.

## 2021-07-27 DIAGNOSIS — I4891 Unspecified atrial fibrillation: Secondary | ICD-10-CM | POA: Diagnosis not present

## 2021-07-27 DIAGNOSIS — F320 Major depressive disorder, single episode, mild: Secondary | ICD-10-CM | POA: Diagnosis not present

## 2021-07-27 DIAGNOSIS — L304 Erythema intertrigo: Secondary | ICD-10-CM | POA: Diagnosis present

## 2021-07-27 DIAGNOSIS — Z9884 Bariatric surgery status: Secondary | ICD-10-CM | POA: Diagnosis not present

## 2021-07-27 DIAGNOSIS — Z794 Long term (current) use of insulin: Secondary | ICD-10-CM | POA: Diagnosis not present

## 2021-07-27 DIAGNOSIS — Z87442 Personal history of urinary calculi: Secondary | ICD-10-CM | POA: Diagnosis not present

## 2021-07-27 DIAGNOSIS — Z85528 Personal history of other malignant neoplasm of kidney: Secondary | ICD-10-CM | POA: Diagnosis not present

## 2021-07-27 DIAGNOSIS — E860 Dehydration: Secondary | ICD-10-CM | POA: Diagnosis present

## 2021-07-27 DIAGNOSIS — Z7984 Long term (current) use of oral hypoglycemic drugs: Secondary | ICD-10-CM | POA: Diagnosis not present

## 2021-07-27 DIAGNOSIS — F05 Delirium due to known physiological condition: Secondary | ICD-10-CM | POA: Diagnosis present

## 2021-07-27 DIAGNOSIS — E876 Hypokalemia: Secondary | ICD-10-CM | POA: Diagnosis present

## 2021-07-27 DIAGNOSIS — R7989 Other specified abnormal findings of blood chemistry: Secondary | ICD-10-CM | POA: Diagnosis not present

## 2021-07-27 DIAGNOSIS — E1165 Type 2 diabetes mellitus with hyperglycemia: Secondary | ICD-10-CM | POA: Diagnosis present

## 2021-07-27 DIAGNOSIS — Z20822 Contact with and (suspected) exposure to covid-19: Secondary | ICD-10-CM | POA: Diagnosis present

## 2021-07-27 DIAGNOSIS — R911 Solitary pulmonary nodule: Secondary | ICD-10-CM | POA: Diagnosis present

## 2021-07-27 DIAGNOSIS — Z79899 Other long term (current) drug therapy: Secondary | ICD-10-CM | POA: Diagnosis not present

## 2021-07-27 DIAGNOSIS — R4189 Other symptoms and signs involving cognitive functions and awareness: Secondary | ICD-10-CM | POA: Diagnosis present

## 2021-07-27 DIAGNOSIS — Z9181 History of falling: Secondary | ICD-10-CM | POA: Diagnosis not present

## 2021-07-27 DIAGNOSIS — R531 Weakness: Secondary | ICD-10-CM | POA: Diagnosis present

## 2021-07-27 DIAGNOSIS — I10 Essential (primary) hypertension: Secondary | ICD-10-CM | POA: Diagnosis present

## 2021-07-27 DIAGNOSIS — E872 Acidosis, unspecified: Secondary | ICD-10-CM | POA: Diagnosis present

## 2021-07-27 DIAGNOSIS — Z803 Family history of malignant neoplasm of breast: Secondary | ICD-10-CM | POA: Diagnosis not present

## 2021-07-27 DIAGNOSIS — Z7901 Long term (current) use of anticoagulants: Secondary | ICD-10-CM | POA: Diagnosis not present

## 2021-07-27 DIAGNOSIS — K5900 Constipation, unspecified: Secondary | ICD-10-CM | POA: Diagnosis present

## 2021-07-27 DIAGNOSIS — F0393 Unspecified dementia, unspecified severity, with mood disturbance: Secondary | ICD-10-CM | POA: Diagnosis present

## 2021-07-27 LAB — GLUCOSE, CAPILLARY
Glucose-Capillary: 198 mg/dL — ABNORMAL HIGH (ref 70–99)
Glucose-Capillary: 198 mg/dL — ABNORMAL HIGH (ref 70–99)
Glucose-Capillary: 203 mg/dL — ABNORMAL HIGH (ref 70–99)
Glucose-Capillary: 243 mg/dL — ABNORMAL HIGH (ref 70–99)
Glucose-Capillary: 255 mg/dL — ABNORMAL HIGH (ref 70–99)

## 2021-07-27 LAB — MAGNESIUM: Magnesium: 1.8 mg/dL (ref 1.7–2.4)

## 2021-07-27 LAB — BASIC METABOLIC PANEL
Anion gap: 9 (ref 5–15)
BUN: 11 mg/dL (ref 8–23)
CO2: 27 mmol/L (ref 22–32)
Calcium: 10.5 mg/dL — ABNORMAL HIGH (ref 8.9–10.3)
Chloride: 100 mmol/L (ref 98–111)
Creatinine, Ser: 0.43 mg/dL — ABNORMAL LOW (ref 0.44–1.00)
GFR, Estimated: >60 mL/min (ref >60)
Glucose, Bld: 187 mg/dL — ABNORMAL HIGH (ref 70–99)
Potassium: 4.1 mmol/L (ref 3.5–5.1)
Sodium: 136 mmol/L (ref 135–145)

## 2021-07-27 MED ORDER — INSULIN GLARGINE-YFGN 100 UNIT/ML ~~LOC~~ SOLN
27.0000 [IU] | Freq: Two times a day (BID) | SUBCUTANEOUS | Status: DC
  Administered 2021-07-27 – 2021-07-31 (×7): 27 [IU] via SUBCUTANEOUS
  Filled 2021-07-27 (×10): qty 0.27

## 2021-07-27 MED ORDER — CARVEDILOL 12.5 MG PO TABS
12.5000 mg | ORAL_TABLET | Freq: Two times a day (BID) | ORAL | Status: DC
Start: 2021-07-27 — End: 2021-07-28
  Administered 2021-07-27: 12.5 mg via ORAL
  Filled 2021-07-27: qty 1

## 2021-07-27 NOTE — Progress Notes (Signed)
Physical Therapy Treatment Patient Details Name: Barbara Frank MRN: [id number] DOB: 07-26-1946 Today's Date: 07/27/2021   History of Present Illness Pt is a 75 y.o. female with medical history significant of diabetes mellitus, atrial fibrillation on Eliquis, falls, depression, renal cell carcinoma, and lung nodule. MD assessment includes: atrial fibrillation with RVR and falls.    PT Comments    Pt presented with significant decline in level of alertness, verbal communication, and functional mobility compared to prior session, nursing and MD notified.  Pt required substantial physical assistance with bed mobility and transfers as well as for both seated and standing balance and was unable to take any steps. SpO2 and HR WNL on room air with pt in no apparent distress during the session.  Pt will benefit from PT services in a SNF setting upon discharge to safely address deficits listed in patient problem list for decreased caregiver assistance and eventual return to PLOF.    Recommendations for follow up therapy are one component of a multi-disciplinary discharge planning process, led by the attending physician.  Recommendations may be updated based on patient status, additional functional criteria and insurance authorization.  Follow Up Recommendations  Skilled nursing-short term rehab (<3 hours/day)     Assistance Recommended at Discharge Frequent or constant Supervision/Assistance  Equipment Recommendations  None recommended by PT    Recommendations for Other Services       Precautions / Restrictions Precautions Precautions: Fall Restrictions Weight Bearing Restrictions: No     Mobility  Bed Mobility Overal bed mobility: Needs Assistance Bed Mobility: Supine to Sit;Sit to Supine     Supine to sit: Max assist;+2 for physical assistance Sit to supine: +2 for physical assistance;Max assist   General bed mobility comments: Max A for BLE and trunk control     Transfers Overall transfer level: Needs assistance Equipment used: Rolling walker (2 wheels) Transfers: Sit to/from Stand Sit to Stand: Mod assist;+2 physical assistance;From elevated surface           General transfer comment: Heavy assist needed to stand from an elevated surface with constant assist needed to prevent LOB once in standing    Ambulation/Gait               General Gait Details: Unable   Stairs             Wheelchair Mobility    Modified Rankin (Stroke Patients Only)       Balance Overall balance assessment: Needs assistance Sitting-balance support: Feet supported;Bilateral upper extremity supported Sitting balance-Leahy Scale: Poor Sitting balance - Comments: Constant min A to prevent L lateral LOB Postural control: Left lateral lean Standing balance support: Bilateral upper extremity supported Standing balance-Leahy Scale: Poor Standing balance comment: Constant assist to prevent LOB                            Cognition Arousal/Alertness: Lethargic Behavior During Therapy: Flat affect Overall Cognitive Status: No family/caregiver present to determine baseline cognitive functioning                                 General Comments: Difficulty following commands with little to no verbal communication this session, nsg and MD notified        Exercises Total Joint Exercises Ankle Circles/Pumps: AAROM;PROM;Both;10 reps Heel Slides: PROM;AAROM;Both;5 reps Hip ABduction/ADduction: PROM;AAROM;Both;10 reps Straight Leg Raises: 10 reps;Both;AAROM;PROM Other Exercises Other Exercises: R lateral  weight shifting activities in sitting to address L lateral lean/instability    General Comments        Pertinent Vitals/Pain Pain Assessment: No/denies pain    Home Living                          Prior Function            PT Goals (current goals can now be found in the care plan section) Progress  towards PT goals: Not progressing toward goals - comment (Per above)    Frequency    Min 2X/week      PT Plan Current plan remains appropriate    Co-evaluation              AM-PAC PT "6 Clicks" Mobility   Outcome Measure  Help needed turning from your back to your side while in a flat bed without using bedrails?: A Lot Help needed moving from lying on your back to sitting on the side of a flat bed without using bedrails?: A Lot Help needed moving to and from a bed to a chair (including a wheelchair)?: Total Help needed standing up from a chair using your arms (e.g., wheelchair or bedside chair)?: A Lot Help needed to walk in hospital room?: Total Help needed climbing 3-5 steps with a railing? : Total 6 Click Score: 9    End of Session Equipment Utilized During Treatment: Gait belt Activity Tolerance: Patient limited by lethargy Patient left: in bed;with call bell/phone within reach;with bed alarm set;with nursing/sitter in room Nurse Communication: Mobility status;Other (comment) (Pt lethargic, no verbal communication, decrease in functional mobility compared to prior session) PT Visit Diagnosis: Unsteadiness on feet (R26.81);Difficulty in walking, not elsewhere classified (R26.2);Muscle weakness (generalized) (M62.81)     Time: 0165-5374 PT Time Calculation (min) (ACUTE ONLY): 23 min  Charges:  $Therapeutic Exercise: 8-22 mins $Therapeutic Activity: 8-22 mins                     D. Scott Nekita Pita PT, DPT 07/27/21, 1:34 PM

## 2021-07-27 NOTE — Plan of Care (Signed)
  Problem: Education: Goal: Knowledge of General Education information will improve Description: Including pain rating scale, medication(s)/side effects and non-pharmacologic comfort measures Outcome: Not Progressing   

## 2021-07-27 NOTE — Progress Notes (Signed)
Inpatient Diabetes Program Recommendations  AACE/ADA: New Consensus Statement on Inpatient Glycemic Control   Target Ranges:  Prepandial:   less than 140 mg/dL      Peak postprandial:   less than 180 mg/dL (1-2 hours)      Critically ill patients:  140 - 180 mg/dL    Latest Reference Range & Units 07/26/21 07:25 07/26/21 12:02 07/26/21 16:28 07/26/21 21:34 07/27/21 08:29  Glucose-Capillary 70 - 99 mg/dL 206 (H) 246 (H) 226 (H) 159 (H) 198 (H)  (H): Data is abnormally high  Review of Glycemic Control  Diabetes history: DM2 Outpatient Diabetes medications: Lantus 25 units BID, Metformin XR 2000 mg daily Current orders for Inpatient glycemic control: Semglee 25 units BID, Novolog 3 units TID with meals, Novolog 0-9 units TID with meals, Novolog 0-5 units QHS  Inpatient Diabetes Program Recommendations:    Insulin: Please consider increasing Semglee to 27 units BID.   Meal Coverage insulin: Per chart, meal coverage insulin (Novolog 3 units TID with meals) was not given at all on 07/26/21 which is likely cause of elevated post prandial glucose.  Patient did receive meal coverage insulin this morning.    NOTE: Per chart, patient from [facility]F following fall and admitted with atrial fibrillation with RVR with noted hx of depression, renal cell carcinoma, DM2. Per note by Dr. Arbutus Ped on 07/26/21, patient has cognitive impairment, suspect dementia and plan is for patient to be discharged to SNF.   Thanks, Barnie Alderman, RN, MSN, CDE Diabetes Coordinator Inpatient Diabetes Program [phone] (Team Pager from 8am to 5pm)

## 2021-07-27 NOTE — TOC Progression Note (Signed)
Transition of Care [facility]) - Progression Note    Patient Details  Name: Barbara Frank MRN: [id number] Date of Birth: 1946/06/13  Transition of Care [facility]) CM/SW Contact  Eileen Stanford, LCSW Phone Number: 07/27/2021, 3:44 PM  Clinical Narrative:   Several attempted made to reach pt's Niece. CSW has left voicemail. Will need to discuss SNF options. CSW awaiting call back. CSW will complete assessment after speaking to Niece.    Expected Discharge Plan: [place] Barriers to Discharge: Continued Medical Work up  Expected Discharge Plan and Services Expected Discharge Plan: [place] arrangements for the past 2 months: [place]                                       Social Determinants of Health (SDOH) Interventions    Readmission Risk Interventions No flowsheet data found.

## 2021-07-27 NOTE — Progress Notes (Signed)
PROGRESS NOTE    Barbara Frank   YSA:[id number]  DOB: 1946/06/28  PCP: Sheral Apley    DOA: 07/23/2021 LOS: 0    Brief Narrative / Hospital Course to Date:   75 year old female with past medical history of type 2 diabetes, A. fib on Eliquis, depression, renal cell carcinoma, lung nodule who presented to the ED on 07/23/2021 after having a fall at her independent living facility.  Patient appears to have some baseline dementia and is a poor historian.  She reported feeling dizzy when she was ambulating to her bathroom and fell but did not pass out or lose consciousness.  No significant injuries reported and imaging of the head and full spine in the ED was negative for any traumatic injury including fractures or dislocations.  Patient was found to be in A. fib with RVR with rate 110s to 130s.  Admitted to [facility] service for further evaluation management.  Assessment & Plan   Principal Problem:   Atrial fibrillation with RVR (HCC) Active Problems:   Depression   Renal cell carcinoma (HCC)   Type 2 diabetes mellitus without complication, with long-term current use of insulin ([place])   Fall at home, initial encounter   Lung nodule   Elevated lactic acid level   Prolonged QT interval   A. fib with RVR -present with HR 110-130's.  Unclear trigger but possibly due to medication noncompliance.  Patient is poor historian, thinks she takes her meds as prescribed.  She was resumed on home Cardizem and heart rates been in the 90s for the most part. --Continue home Cardizem, increase to 240 mg (for HR and BP) -- Stop Toprol-XL, change to Coreg for better BP control --Start Coreg 12.5 mg twice daily --Continue Eliquis --Continue IV Lopressor as needed if rate sustaining above 110 --Monitor on telemetry   Delirium on ?underlying cognitive impairment.  Pt seems to be developing hospital delirium between hospital day 2 and 3.   Reported to be trying repeatedly to get out of bed on  11/20. She is having waxing and waning periods of somnolence and being generally less interactive at times  No fevers chills or leukocytosis to suggest infection.  No focal neurologic deficits --delirium precautions --sitter for safety --monitor closely for s/sx's of infection, monitor electrolytes, renal function --monitor neurologic status --further work up as indicated  Hypomagnesemia - replaced 2 g IV Mg-sulfate for Mg level 1.6 (11/20).  Hypercalcemia - Ca 10.4.  Mild. --Check intact PTH and ionized calcium with tomorrow's labs  Hypertension -Home regimen includes Cardizem and Toprol as above.   -- Cardizem dose increased to 240 mg --Metoprolol changed to Coreg  Status post fall -unclear cause given patient poor historian but she did report recently having dizziness.  Fall could have been secondary to A. fib RVR. --PT and OT evaluations --SNF recommended for short-term rehab, TOC following for placement --Fall precautions --Mobilize as tolerated  Abdominal intertrigo dermatitis -continue topical miconazole powder Monitor closely and keep skin folds dry.  Prolonged QTC -present on admission, QTC was 528 on EKG. --Lexapro on hold --Avoid QT prolonging agents as much as possible --Monitor replace electrolytes  Elevated lactic acid -POA, resolved.  Patient has no signs or symptoms of infection.   Suspect due to mild dehydration and A. fib RVR.  Patient was treated with empiric Rocephin in the ED for possible UTI but UA was negative.  Received IV fluids.  Insulin-dependent type 2 diabetes  -last A1c 8.8%, poorly controlled. Home regimen includes  metformin, Lantus, NovoLog.  Compliance with medications is questionable. --Continued on home glargine 25 units daily --Add 3 units NovoLog 3 times daily with meals --Continue sliding scale NovoLog -- Monitor CBGs and adjust insulin for inpatient goal 140-180  History of renal cell carcinoma -follows with Dr. Tasia Catchings with oncology.   Formerly on axitinib which is apparently held due to poorly controlled diabetes and hyperglycemia.  Follow-up as outpatient with Dr. Tasia Catchings.  Depression -Lexapro on hold due to QT prolongation  Cognitive impairment, suspect dementia -baseline cognition is not clear but patient has poor recollection of recent symptoms or events. --Delirium precautions   Patient BMI: Body mass index is 27.64 kg/m.   DVT prophylaxis:  apixaban (ELIQUIS) tablet 5 mg   Diet:  Diet Orders (From admission, onward)     Start     Ordered   07/23/21 1359  Diet heart healthy/carb modified Room service appropriate? Yes; Fluid consistency: Thin  Diet effective now       Question Answer Comment  Diet-HS Snack? Nothing   Room service appropriate? Yes   Fluid consistency: Thin      07/23/21 1358              Code Status: Full Code   Subjective 07/27/21    Patient was sleeping but woke pretty easily to voice.  Sitter at bedside reports she has been sleeping comfortably and otherwise calm.  She yesterday she was constantly attempting to get out of bed requiring sitter for safety.  Patient is not as interactive the last day or 2, but she denies feeling sick having trouble breathing or any complaints.   Disposition Plan & Communication   Status is: Inpatient  Remains inpatient appropriate because: Severity of illness requiring IV therapies as above, BP uncontrolled with medication changes underway.  Requires SNF placement.   Consults, Procedures, Significant Events   Consultants:  None  Procedures:  None  Antimicrobials:  Anti-infectives (From admission, onward)    Start     Dose/Rate Route Frequency Ordered Stop   07/23/21 1330  cefTRIAXone (ROCEPHIN) 1 g in sodium chloride 0.9 % 100 mL IVPB        1 g 200 mL/hr over 30 Minutes Intravenous  Once 07/23/21 1329 07/23/21 1408         Micro    Objective   Vitals:   07/27/21 0030 07/27/21 0536 07/27/21 0804 07/27/21 1148  BP: (!) 158/86  (!) 148/76 (!) 164/95 133/64  Pulse: 84 88 90 70  Resp: 18 17 20 17   Temp: 98.2 F (36.8 C) 98.1 F (36.7 C) 97.9 F (36.6 C) 98 F (36.7 C)  TempSrc:      SpO2: 99% 98% 100% 97%  Weight:      Height:        Intake/Output Summary (Last 24 hours) at 07/27/2021 1428 Last data filed at 07/27/2021 1401 Gross per 24 hour  Intake 410 ml  Output 1000 ml  Net -590 ml   Filed Weights   07/23/21 1143  Weight: 73 kg    Physical Exam:  General exam: Sleeping semireclined in bed, woke easily to voice, no acute distress, minimally interactive Respiratory system: Lungs clear bilaterally, on room air with normal respiratory effort Cardiovascular system: Irregular rhythm, rate controlled, no peripheral edema Central nervous system: no gross focal neurologic deficits, normal speech Extremities: moves all, no cyanosis, normal tone Psychiatry: normal mood, flat affect, non-verbal and minimally interactive  Labs   Data Reviewed: I have personally reviewed following  labs and imaging studies  CBC: Recent Labs  Lab 07/23/21 1148 07/25/21 0554  WBC 6.7 6.8  NEUTROABS 5.0  --   HGB 15.4* 13.4  HCT 48.4* 41.7  MCV 83.0 81.1  PLT 173 570   Basic Metabolic Panel: Recent Labs  Lab 07/23/21 1148 07/24/21 0454 07/25/21 0554 07/26/21 0459 07/27/21 0628  NA 136 135 136 139 136  K 4.1 3.6 3.4* 3.8 4.1  CL 100 105 104 104 100  CO2 25 22 25 29 27   GLUCOSE 291* 254* 192* 191* 187*  BUN 12 12 9 13 11   CREATININE 0.59 0.43* 0.32* 0.46 0.43*  CALCIUM 10.3 9.8 10.1 10.4* 10.5*  MG  --   --   --  1.6* 1.8   GFR: Estimated Creatinine Clearance: 60.4 mL/min (A) (by C-G formula based on SCr of 0.43 mg/dL (L)). Liver Function Tests: Recent Labs  Lab 07/23/21 1148  AST 20  ALT 12  ALKPHOS 95  BILITOT 1.8*  PROT 7.1  ALBUMIN 3.7   No results for input(s): LIPASE, AMYLASE in the last 168 hours. No results for input(s): AMMONIA in the last 168 hours. Coagulation Profile: No results  for input(s): INR, PROTIME in the last 168 hours. Cardiac Enzymes: Recent Labs  Lab 07/23/21 1148  CKTOTAL 39   BNP (last 3 results) No results for input(s): PROBNP in the last 8760 hours. HbA1C: No results for input(s): HGBA1C in the last 72 hours.  CBG: Recent Labs  Lab 07/26/21 1202 07/26/21 1628 07/26/21 2134 07/27/21 0829 07/27/21 1143  GLUCAP 246* 226* 159* 198* 243*   Lipid Profile: No results for input(s): CHOL, HDL, LDLCALC, TRIG, CHOLHDL, LDLDIRECT in the last 72 hours. Thyroid Function Tests: No results for input(s): TSH, T4TOTAL, FREET4, T3FREE, THYROIDAB in the last 72 hours. Anemia Panel: No results for input(s): VITAMINB12, FOLATE, FERRITIN, TIBC, IRON, RETICCTPCT in the last 72 hours. Sepsis Labs: Recent Labs  Lab 07/23/21 1148 07/23/21 1508 07/23/21 1736  PROCALCITON <0.10  --   --   LATICACIDVEN 2.4* 2.1* 1.8    Recent Results (from the past 240 hour(s))  Culture, blood (routine x 2)     Status: None (Preliminary result)   Collection Time: 07/23/21 11:48 AM   Specimen: BLOOD  Result Value Ref Range Status   Specimen Description BLOOD LEFT ANTECUBITAL  Final   Special Requests   Final    BOTTLES DRAWN AEROBIC AND ANAEROBIC Blood Culture results may not be optimal due to an excessive volume of blood received in culture bottles   Culture   Final    NO GROWTH 4 DAYS Performed at [facility], [address] [place], [place] [address]    Report Status PENDING  Incomplete  Culture, blood (routine x 2)     Status: None (Preliminary result)   Collection Time: 07/23/21 12:13 PM   Specimen: BLOOD  Result Value Ref Range Status   Specimen Description BLOOD BLOOD RIGHT ARM  Final   Special Requests   Final    BOTTLES DRAWN AEROBIC AND ANAEROBIC Blood Culture adequate volume   Culture   Final    NO GROWTH 4 DAYS Performed at [facility], [address], [place], [place] [address]    Report Status PENDING  Incomplete  Resp Panel  by RT-PCR (Flu A&B, Covid) Nasopharyngeal Swab     Status: None   Collection Time: 07/23/21 12:13 PM   Specimen: Nasopharyngeal Swab; Nasopharyngeal(NP) swabs in vial transport medium  Result Value Ref Range  Status   SARS Coronavirus 2 by RT PCR NEGATIVE NEGATIVE Final    Comment: (NOTE) SARS-CoV-2 target nucleic acids are NOT DETECTED.  The SARS-CoV-2 RNA is generally detectable in upper respiratory specimens during the acute phase of infection. The lowest concentration of SARS-CoV-2 viral copies this assay can detect is 138 copies/mL. A negative result does not preclude SARS-Cov-2 infection and should not be used as the sole basis for treatment or other patient management decisions. A negative result may occur with  improper specimen collection/handling, submission of specimen other than nasopharyngeal swab, presence of viral mutation(s) within the areas targeted by this assay, and inadequate number of viral copies(<138 copies/mL). A negative result must be combined with clinical observations, patient history, and epidemiological information. The expected result is Negative.  Fact Sheet for Patients:  EntrepreneurPulse.com.au  Fact Sheet for Healthcare Providers:  IncredibleEmployment.be  This test is no t yet approved or cleared by the [place] FDA and  has been authorized for detection and/or diagnosis of SARS-CoV-2 by FDA under an Emergency Use Authorization (EUA). This EUA will remain  in effect (meaning this test can be used) for the duration of the COVID-19 declaration under Section 564(b)(1) of the Act, 21 U.S.C.section 360bbb-3(b)(1), unless the authorization is terminated  or revoked sooner.       Influenza A by PCR NEGATIVE NEGATIVE Final   Influenza B by PCR NEGATIVE NEGATIVE Final    Comment: (NOTE) The Xpert Xpress SARS-CoV-2/FLU/RSV plus assay is intended as an aid in the diagnosis of influenza from Nasopharyngeal swab  specimens and should not be used as a sole basis for treatment. Nasal washings and aspirates are unacceptable for Xpert Xpress SARS-CoV-2/FLU/RSV testing.  Fact Sheet for Patients: EntrepreneurPulse.com.au  Fact Sheet for Healthcare Providers: IncredibleEmployment.be  This test is not yet approved or cleared by the [place] FDA and has been authorized for detection and/or diagnosis of SARS-CoV-2 by FDA under an Emergency Use Authorization (EUA). This EUA will remain in effect (meaning this test can be used) for the duration of the COVID-19 declaration under Section 564(b)(1) of the Act, 21 U.S.C. section 360bbb-3(b)(1), unless the authorization is terminated or revoked.  Performed at [facility], [address], [place], [place] [address]       Imaging Studies   No results found.   Medications   Scheduled Meds:  apixaban  5 mg Oral BID   carvedilol  12.5 mg Oral BID WC   diltiazem  240 mg Oral QPM   insulin aspart  0-5 Units Subcutaneous QHS   insulin aspart  0-9 Units Subcutaneous TID WC   insulin aspart  3 Units Subcutaneous TID WC   insulin glargine-yfgn  25 Units Subcutaneous BID   loratadine  10 mg Oral Daily   miconazole nitrate   Topical BID   multivitamin with minerals  1 tablet Oral Daily   Continuous Infusions:     LOS: 0 days    Time spent: 25 minutes with > 50% spent at bedside and in coordination of care    Ezekiel Slocumb, DO [facility]  07/27/2021, 2:28 PM      If 7PM-7AM, please contact night-coverage. How to contact the [facility] Attending or Consulting provider [place] or covering provider during after hours [place], for this patient?    Check the care team in [facility] and look for a) attending/consulting [facility] provider listed and b) the [facility] team listed Log into [email or web address] and use [place]'s universal password to  access. If you do not have the password, please contact the hospital  operator. Locate the [facility] provider you are looking for under [facility] and page to a number that you can be directly reached. If you still have difficulty reaching the provider, please page the [facility] (Director on Call) for the Hospitalists listed on amion for assistance.

## 2021-07-28 DIAGNOSIS — I4891 Unspecified atrial fibrillation: Secondary | ICD-10-CM | POA: Diagnosis not present

## 2021-07-28 LAB — BASIC METABOLIC PANEL
Anion gap: 7 (ref 5–15)
BUN: 22 mg/dL (ref 8–23)
CO2: 27 mmol/L (ref 22–32)
Calcium: 10.3 mg/dL (ref 8.9–10.3)
Chloride: 101 mmol/L (ref 98–111)
Creatinine, Ser: 0.45 mg/dL (ref 0.44–1.00)
GFR, Estimated: >60 mL/min (ref >60)
Glucose, Bld: 205 mg/dL — ABNORMAL HIGH (ref 70–99)
Potassium: 4.1 mmol/L (ref 3.5–5.1)
Sodium: 135 mmol/L (ref 135–145)

## 2021-07-28 LAB — CULTURE, BLOOD (ROUTINE X 2)
Culture: NO GROWTH
Culture: NO GROWTH
Special Requests: ADEQUATE

## 2021-07-28 LAB — CBC
HCT: 41.6 % (ref 36.0–46.0)
Hemoglobin: 13.6 g/dL (ref 12.0–15.0)
MCH: 27 pg (ref 26.0–34.0)
MCHC: 32.7 g/dL (ref 30.0–36.0)
MCV: 82.5 fL (ref 80.0–100.0)
Platelets: 218 10*3/uL (ref 150–400)
RBC: 5.04 MIL/uL (ref 3.87–5.11)
RDW: 14.9 % (ref 11.5–15.5)
WBC: 7.9 10*3/uL (ref 4.0–10.5)
nRBC: 0 % (ref 0.0–0.2)

## 2021-07-28 LAB — GLUCOSE, CAPILLARY
Glucose-Capillary: 202 mg/dL — ABNORMAL HIGH (ref 70–99)
Glucose-Capillary: 297 mg/dL — ABNORMAL HIGH (ref 70–99)
Glucose-Capillary: 250 mg/dL — ABNORMAL HIGH (ref 70–99)
Glucose-Capillary: 136 mg/dL — ABNORMAL HIGH (ref 70–99)

## 2021-07-28 LAB — RESP PANEL BY RT-PCR (FLU A&B, COVID) ARPGX2
Influenza A by PCR: NEGATIVE
Influenza B by PCR: NEGATIVE
SARS Coronavirus 2 by RT PCR: NEGATIVE

## 2021-07-28 LAB — MAGNESIUM: Magnesium: 1.8 mg/dL (ref 1.7–2.4)

## 2021-07-28 MED ORDER — SENNOSIDES-DOCUSATE SODIUM 8.6-50 MG PO TABS
1.0000 | ORAL_TABLET | Freq: Two times a day (BID) | ORAL | Status: DC
  Administered 2021-07-28 (×2): 1 via ORAL
  Filled 2021-07-28 (×2): qty 1

## 2021-07-28 MED ORDER — INSULIN GLARGINE 100 UNIT/ML ~~LOC~~ SOLN: 27.0000 [IU] | Freq: Two times a day (BID) | SUBCUTANEOUS | 11 refills | Status: DC

## 2021-07-28 MED ORDER — CARVEDILOL 6.25 MG PO TABS: 6.2500 mg | ORAL_TABLET | Freq: Two times a day (BID) | ORAL | Status: DC

## 2021-07-28 MED ORDER — SENNOSIDES-DOCUSATE SODIUM 8.6-50 MG PO TABS: 1.0000 | ORAL_TABLET | Freq: Two times a day (BID) | ORAL | Status: DC

## 2021-07-28 MED ORDER — POLYETHYLENE GLYCOL 3350 17 G PO PACK: 17.0000 g | PACK | Freq: Every day | ORAL | 0 refills | Status: DC

## 2021-07-28 MED ORDER — POLYETHYLENE GLYCOL 3350 17 G PO PACK
17.0000 g | PACK | Freq: Every day | ORAL | Status: DC
  Administered 2021-07-28 – 2021-07-29 (×2): 17 g via ORAL
  Filled 2021-07-28 (×2): qty 1

## 2021-07-28 MED ORDER — CARVEDILOL 6.25 MG PO TABS
6.2500 mg | ORAL_TABLET | Freq: Two times a day (BID) | ORAL | Status: DC
  Administered 2021-07-28 – 2021-07-31 (×6): 6.25 mg via ORAL
  Filled 2021-07-28 (×6): qty 1

## 2021-07-28 MED ORDER — METFORMIN HCL ER 500 MG PO TB24
500.0000 mg | ORAL_TABLET | Freq: Two times a day (BID) | ORAL | Status: DC
Start: 2021-07-28 — End: 2021-09-11

## 2021-07-28 MED ORDER — ADULT MULTIVITAMIN W/MINERALS CH: 1.0000 | ORAL_TABLET | Freq: Every day | ORAL | Status: DC

## 2021-07-28 MED ORDER — ACETAMINOPHEN 325 MG PO TABS: 650.0000 mg | ORAL_TABLET | Freq: Four times a day (QID) | ORAL | Status: DC | PRN

## 2021-07-28 MED ORDER — DILTIAZEM HCL ER COATED BEADS 240 MG PO CP24
240.0000 mg | ORAL_CAPSULE | Freq: Every evening | ORAL | Status: DC
Start: 2021-07-28 — End: 2021-09-11

## 2021-07-28 MED ORDER — MICONAZOLE NITRATE POWD: 1.0000 "application " | Freq: Two times a day (BID) | 0 refills | Status: AC

## 2021-07-28 MED ORDER — BISACODYL 10 MG RE SUPP
10.0000 mg | Freq: Once | RECTAL | Status: AC
  Administered 2021-07-28: 10 mg via RECTAL
  Filled 2021-07-28: qty 1

## 2021-07-28 NOTE — Progress Notes (Signed)
Occupational Therapy Treatment Patient Details Name: Barbara Frank MRN: [id number] DOB: 16-Oct-1945 Today's Date: 07/28/2021   History of present illness Pt is a 75 y.o. female with medical history significant of diabetes mellitus, atrial fibrillation on Eliquis, falls, depression, renal cell carcinoma, and lung nodule. MD assessment includes: atrial fibrillation with RVR and falls.   OT comments  Upon entering the room, pt supine in bed and sleeping soundly. Pt awakens and requesting something to drink. She is unable to locate her meal tray herself and cup placed in R hand. Pt able to bring to mouth and drinks entire cup of thin liquids. Pt then appears more alert but OT noted pt needing mod multimodal cuing to initiate and sequence tasks. She is oriented to self only. When asked location she is unable to verbalize a response but when given choice of 2 she answers "no" and is still not correct. Pt able to follow commands to lift legs from bed but unable to sequence supine >sit without physical assistance. Pt seated on EOB with min A for sitting balance. She is able to wash face and brush hair with increased time and needing assist for balance with L lateral lean. Pt returns to bed secondary to fatigue with max A. All needs within reach and bed alarm activated.    Recommendations for follow up therapy are one component of a multi-disciplinary discharge planning process, led by the attending physician.  Recommendations may be updated based on patient status, additional functional criteria and insurance authorization.    Follow Up Recommendations  Skilled nursing-short term rehab (<3 hours/day)    Assistance Recommended at Discharge Frequent or constant Supervision/Assistance  Equipment Recommendations  Other (comment) (defer to next venue of care)       Precautions / Restrictions Precautions Precautions: Fall Restrictions Weight Bearing Restrictions: No       Mobility Bed  Mobility Overal bed mobility: Needs Assistance Bed Mobility: Supine to Sit;Sit to Supine     Supine to sit: Max assist Sit to supine: Max assist        Transfers Overall transfer level: Needs assistance Equipment used: Rolling walker (2 wheels) Transfers: Sit to/from Stand Sit to Stand: Max assist           General transfer comment: heavy posterior bias     Balance Overall balance assessment: Needs assistance Sitting-balance support: Feet supported;Bilateral upper extremity supported Sitting balance-Leahy Scale: Poor Sitting balance - Comments: Constant min A to prevent L lateral LOB   Standing balance support: Bilateral upper extremity supported Standing balance-Leahy Scale: Poor Standing balance comment: Constant assist to prevent LOB                           ADL either performed or assessed with clinical judgement   ADL Overall ADL's : Needs assistance/impaired     Grooming: Wash/dry hands;Wash/dry face;Sitting;Set up;Supervision/safety;Brushing hair Grooming Details (indicate cue type and reason): min A for sitting balance                                    Extremity/Trunk Assessment Upper Extremity Assessment Upper Extremity Assessment: Generalized weakness   Lower Extremity Assessment Lower Extremity Assessment: Generalized weakness        Vision Baseline Vision/History: 1 Wears glasses Patient Visual Report: No change from baseline            Cognition Arousal/Alertness: Awake/alert Behavior  During Therapy: Flat affect Overall Cognitive Status: No family/caregiver present to determine baseline cognitive functioning                                 General Comments: Pt with decreased difficulty following commands and limited verbal communication.                     Pertinent Vitals/ Pain       Pain Assessment: Faces Pain Score: 0-No pain         Frequency  Min 2X/week        Progress  Toward Goals  OT Goals(current goals can now be found in the care plan section)  Progress towards OT goals: Progressing toward goals  Acute Rehab OT Goals Patient Stated Goal: " I need something. I feel dehydrated." OT Goal Formulation: With patient Time For Goal Achievement: 08/07/21 Potential to Achieve Goals: Good  Plan Discharge plan remains appropriate;Frequency remains appropriate       AM-PAC OT "6 Clicks" Daily Activity     Outcome Measure   Help from another person eating meals?: A Little Help from another person taking care of personal grooming?: A Little Help from another person toileting, which includes using toliet, bedpan, or urinal?: A Lot Help from another person bathing (including washing, rinsing, drying)?: A Lot Help from another person to put on and taking off regular upper body clothing?: A Little Help from another person to put on and taking off regular lower body clothing?: A Lot 6 Click Score: 15    End of Session Equipment Utilized During Treatment: Rolling walker (2 wheels)  OT Visit Diagnosis: Unsteadiness on feet (R26.81);Repeated falls (R29.6);Muscle weakness (generalized) (M62.81);History of falling (Z91.81)   Activity Tolerance Patient limited by fatigue   Patient Left in bed;with call bell/phone within reach;with bed alarm set   Nurse Communication Mobility status        Time: 0867-6195 OT Time Calculation (min): 24 min  Charges: OT General Charges $OT Visit: 1 Visit OT Treatments $Self Care/Home Management : 23-37 mins  Darleen Crocker, MS, OTR/L , CBIS ascom [phone]  07/28/21, 11:12 AM

## 2021-07-28 NOTE — Discharge Summary (Addendum)
Physician Discharge Summary  Barbara Frank ZJI:[id number] DOB: Nov 17, 1945 DOA: 07/23/2021  PCP: Sheral Apley  Admit date: 07/23/2021 Discharge date: 07/28/2021  Admitted From: ALF Disposition:  SNF / rehab  Recommendations for Outpatient Follow-up:  Follow up with PCP in 1-2 weeks Please obtain BMP/CBC in one week Please follow up on patient's heart rate and blood pressure control Follow up on resolution of patient's intertrigous dermatitis under abdominal skin fold Follow up on blood sugar control and adjust insulin as needed  Home Health: no  Equipment/Devices: none   Discharge Condition: stable  CODE STATUS: full  Diet recommendation: Heart Healthy / Carb Modified      Discharge Diagnoses: Principal Problem:   Atrial fibrillation with RVR ([place]) Active Problems:   Depression   Renal cell carcinoma ([place])   Type 2 diabetes mellitus without complication, with long-term current use of insulin ([place])   Fall at home, initial encounter   Lung nodule   Elevated lactic acid level   Prolonged QT interval    Summary of HPI and Hospital Course:  74 year old female with past medical history of type 2 diabetes, A. fib on Eliquis, depression, renal cell carcinoma, lung nodule who presented to the ED on 07/23/2021 after having a fall at her independent living facility.  Patient appears to have some baseline dementia and is a poor historian.  She reported feeling dizzy when she was ambulating to her bathroom and fell but did not pass out or lose consciousness.  No significant injuries reported and imaging of the head and full spine in the ED was negative for any traumatic injury including fractures or dislocations.   Patient was found to be in A. fib with RVR with rate 110s to 130s.  Admitted to [facility] service for further evaluation management.   A. fib with RVR / A-fib type clinically undetermited  -RVR present with HR 110-130's.  Unclear trigger but possibly due to medication  noncompliance.  Patient is poor historian, thinks she takes her meds as prescribed.  She was resumed on home Cardizem and heart rates been in the 90s for the most part. --Continue home Cardizem, increase to 240 mg (for HR and BP) -- Stop Toprol-XL, change to Coreg for better BP control --Start Coreg 12.5 mg twice daily >> reduce to 6.25 mg BID to avoid lower BP too much --Continue Eliquis --Treated with IV Lopressor PRN for HR sustaining above 110 --Monitored on telemetry     Delirium superimposed on ?underlying cognitive impairment.  Pt seemed to develop hospital delirium between hospital day 2 and 3.   Reported to be trying repeatedly to get out of bed on 11/20, difficult to reorient. She has waxing and waning periods of somnolence and being generally less interactive at times , talkative and acting normal at other times. No fevers chills or leukocytosis to suggest infection.   No focal neurologic deficits --monitor closely for s/sx's of infection, monitor electrolytes, renal function No further work up indicated at this time.   Hypomagnesemia - replaced 2 g IV Mg-sulfate for Mg level 1.6 (11/20). --Monitor in follow up and replace if needed to keep Mg>2  Hypokalemia - replaced K 3.4 on 11/19. --Monitor and replaced as needed.   Hypercalcemia - Ca 10.4.  Mild. --Check intact PTH and ionized calcium with tomorrow's labs Outpatient follow up for further evaluation if indicated.   Hypertension -Home regimen includes Cardizem and Toprol as above.   --Cardizem dose increased to 240 mg --Metoprolol changed to Coreg 6.25 mg  PO BID   Status post fall -unclear cause given patient poor historian but she did report recently having dizziness.  Fall could have been secondary to A. fib RVR. PT and OT evaluated. SNF recommended for short-term rehab Fall precautions Mobilize as tolerated and frequently  Abdominal intertrigo dermatitis -continue topical miconazole powder Monitor closely and  keep skin folds dry.   Prolonged QTC -present on admission, QTC was 528 on EKG. --Lexapro held during admission, resume at d/c --Avoid QT prolonging agents as much as possible --Monitor and replace electrolytes to keep K>4 and Mg>2  Lactic Acidosis / Elevated lactic acid -POA, resolved.   Patient has no signs or symptoms of infection.   Suspect due to mild dehydration and A. fib RVR.   Patient was treated with empiric Rocephin in the ED for possible UTI but UA was negative. Received IV fluids.    Insulin-dependent type 2 diabetes  -last A1c 8.8%, poorly controlled. Home regimen includes metformin, Lantus, NovoLog.   Compliance with medications is questionable. --Continued on home glargine 25 units daily, increased to 27 units --Continue NovoLog 4 units TID with meals and adjust as needed -- Monitor CBGs closely  History of renal cell carcinoma -follows with Dr. Tasia Catchings with oncology.   Formerly on axitinib which is apparently held due to poorly controlled diabetes and hyperglycemia. Follow-up as outpatient with Dr. Tasia Catchings as previously scheduled.  Depression -Lexapro resumed at discharge  Cognitive impairment, suspect dementia - baseline cognition is not clear but patient has poor recollection of recent symptoms or events.   --Delirium precautions:     -Lights and TV off, minimize interruptions at night    -Blinds open and lights on during day    -Glasses/hearing aid with patient    -Frequent reorientation    -PT/OT when able    -Avoid sedation medications/Beers list medications      Discharge Instructions   Discharge Instructions     Call MD for:   Complete by: As directed    Heart racing / palpitations, chest pain, shortness of breath. Uncontrolled blood sugars - high or low Uncontrolled blood pressure - if above 150/90 or below 100/60   Call MD for:  extreme fatigue   Complete by: As directed    Call MD for:  persistant dizziness or light-headedness   Complete by: As  directed    Call MD for:  persistant nausea and vomiting   Complete by: As directed    Call MD for:  severe uncontrolled pain   Complete by: As directed    Call MD for:  temperature >100.4   Complete by: As directed    Diet - low sodium heart healthy   Complete by: As directed    Discharge instructions   Complete by: As directed    For your A-fib, your heart rate was uncontrolled when you were admitted to the hospital. During your stay, your blood pressure was also elevated, so we changed your medications as follows:  --increased dose of diltiazem (Cardizem) --changed your beta blocker from metoprolol to carvedilol (Coreg) to help more with BP control  Please monitor BP and HR at rehab closely.  Follow up with facility doctor and/or your Primary Care provider within 1 week of discharge.  RENAL CELL CARCINOMA -- Dr. Tasia Catchings had been holding your medication (axitinib) because of persitently high blood sugars.  Please follow up with her in clinic as previously scheduled or after rehab stay.  BLOOD SUGARS -- please watch closely and, if uncontrolled,  adjust insulin dosing.   Increase activity slowly   Complete by: As directed       Allergies as of 07/28/2021       Reactions   Kiwi Extract         Medication List     STOP taking these medications    axitinib 5 MG tablet Commonly known as: INLYTA   diclofenac Sodium 1 % Gel Commonly known as: VOLTAREN   lactulose 10 GM/15ML solution Commonly known as: CHRONULAC   metoprolol succinate 50 MG 24 hr tablet Commonly known as: TOPROL-XL   nystatin powder Commonly known as: MYCOSTATIN/NYSTOP       TAKE these medications    acetaminophen 325 MG tablet Commonly known as: TYLENOL Take 2 tablets (650 mg total) by mouth every 6 (six) hours as needed for mild pain, fever or headache.   apixaban 5 MG Tabs tablet Commonly known as: ELIQUIS Take 5 mg by mouth 2 (two) times daily.   carvedilol 6.25 MG tablet Commonly known  as: COREG Take 1 tablet (6.25 mg total) by mouth 2 (two) times daily with a meal.   cetirizine 10 MG tablet Commonly known as: ZYRTEC Take 5 mg by mouth daily.   diltiazem 240 MG 24 hr capsule Commonly known as: CARDIZEM CD Take 1 capsule (240 mg total) by mouth every evening. What changed:  medication strength how much to take   escitalopram 10 MG tablet Commonly known as: LEXAPRO Take 10 mg by mouth daily.   feeding supplement (GLUCERNA SHAKE) Liqd Take 237 mLs by mouth 3 (three) times daily between meals.   insulin aspart 100 UNIT/ML injection Commonly known as: novoLOG Inject 4 Units into the skin 3 (three) times daily with meals.   insulin glargine 100 UNIT/ML injection Commonly known as: LANTUS Inject 0.27 mLs (27 Units total) into the skin 2 (two) times daily. What changed: how much to take   metFORMIN 500 MG 24 hr tablet Commonly known as: GLUCOPHAGE-XR Take 1 tablet (500 mg total) by mouth 2 (two) times daily. What changed:  how much to take how to take this when to take this   miconazole nitrate Powd Commonly known as: MICATIN Apply 1 application topically 2 (two) times daily for 7 days.   multivitamin with minerals Tabs tablet Take 1 tablet by mouth daily. Start taking on: July 29, 2021   polyethylene glycol 17 g packet Commonly known as: MIRALAX / GLYCOLAX Take 17 g by mouth daily. Hold if loose stools or frequent BMs Start taking on: July 29, 2021   senna-docusate 8.6-50 MG tablet Commonly known as: Senokot-S Take 1 tablet by mouth 2 (two) times daily. Hold if loose stools or frequent BMs        Allergies  Allergen Reactions   Kiwi Extract      If you experience worsening of your admission symptoms, develop shortness of breath, life threatening emergency, suicidal or homicidal thoughts you must seek medical attention immediately by calling 911 or calling your MD immediately  if symptoms less severe.    Please note   You were  cared for by a hospitalist during your hospital stay. If you have any questions about your discharge medications or the care you received while you were in the hospital after you are discharged, you can call the unit and asked to speak with the hospitalist on call if the hospitalist that took care of you is not available. Once you are discharged, your primary care physician will handle any  further medical issues. Please note that NO REFILLS for any discharge medications will be authorized once you are discharged, as it is imperative that you return to your primary care physician (or establish a relationship with a primary care physician if you do not have one) for your aftercare needs so that they can reassess your need for medications and monitor your lab values.   Consultations: None    Procedures/Studies: CT Head Wo Contrast  Result Date: 07/23/2021 CLINICAL DATA:  Fall EXAM: CT HEAD WITHOUT CONTRAST CT CERVICAL SPINE WITHOUT CONTRAST TECHNIQUE: Multidetector CT imaging of the head and cervical spine was performed following the standard protocol without intravenous contrast. Multiplanar CT image reconstructions of the cervical spine were also generated. COMPARISON:  CT head dated March 06, 2021 FINDINGS: CT HEAD FINDINGS Brain: Chronic white matter ischemic change. No evidence of acute infarction, hemorrhage, hydrocephalus, extra-axial collection or mass lesion/mass effect. Vascular: No hyperdense vessel or unexpected calcification. Skull: Normal. Negative for fracture or focal lesion. Sinuses/Orbits: Wall thickening of the left maxillary sinus. No acute abnormality. Other: None. CT CERVICAL SPINE FINDINGS Alignment: Normal. Skull base and vertebrae: No acute fracture. No primary bone lesion or focal pathologic process. Soft tissues and spinal canal: No prevertebral fluid or swelling. No visible canal hematoma. Disc levels:  Mild multilevel degenerative disc disease. Upper chest: Negative. Other: None.  IMPRESSION: 1. No acute intracranial abnormality. 2. No CT evidence of acute cervical spine injury. Electronically Signed   By: Yetta Glassman M.D.   On: 07/23/2021 12:57   CT Cervical Spine Wo Contrast  Result Date: 07/23/2021 CLINICAL DATA:  Fall EXAM: CT HEAD WITHOUT CONTRAST CT CERVICAL SPINE WITHOUT CONTRAST TECHNIQUE: Multidetector CT imaging of the head and cervical spine was performed following the standard protocol without intravenous contrast. Multiplanar CT image reconstructions of the cervical spine were also generated. COMPARISON:  CT head dated March 06, 2021 FINDINGS: CT HEAD FINDINGS Brain: Chronic white matter ischemic change. No evidence of acute infarction, hemorrhage, hydrocephalus, extra-axial collection or mass lesion/mass effect. Vascular: No hyperdense vessel or unexpected calcification. Skull: Normal. Negative for fracture or focal lesion. Sinuses/Orbits: Wall thickening of the left maxillary sinus. No acute abnormality. Other: None. CT CERVICAL SPINE FINDINGS Alignment: Normal. Skull base and vertebrae: No acute fracture. No primary bone lesion or focal pathologic process. Soft tissues and spinal canal: No prevertebral fluid or swelling. No visible canal hematoma. Disc levels:  Mild multilevel degenerative disc disease. Upper chest: Negative. Other: None. IMPRESSION: 1. No acute intracranial abnormality. 2. No CT evidence of acute cervical spine injury. Electronically Signed   By: Yetta Glassman M.D.   On: 07/23/2021 12:57   CT Thoracic Spine Wo Contrast  Result Date: 07/23/2021 CLINICAL DATA:  Back trauma, fall, poor historian EXAM: CT THORACIC AND LUMBAR SPINE WITHOUT CONTRAST TECHNIQUE: Multidetector CT imaging of the thoracic and lumbar spine was performed without contrast. Multiplanar CT image reconstructions were also generated. COMPARISON:  No prior CT of the thoracic and lumbar spine, correlation is made with CT chest abdomen pelvis 06/23/2021 FINDINGS: CT THORACIC SPINE  FINDINGS Alignment: Dextrocurvature of the upper thoracic spine and levocurvature of the lower thoracic spine, with dextrocurvature of the lumbar spine. No listhesis. Vertebrae: Osteopenia. No acute fracture or suspicious osseous lesion. No fracture in the imaged ribs. Paraspinal and other soft tissues: Solid pulmonary nodule in the anterior right upper lobe, measuring up to 1.1 cm (series 6, image 50), which appears unchanged compared to 06/23/2021 the previously noted right middle lobe nodule is  not included in the field view. No pleural effusion. No pneumothorax visualized. Large right renal lesion, incompletely imaged. Disc levels: Mild degenerative changes without significant spinal canal stenosis. CT LUMBAR SPINE FINDINGS Segmentation: 5 lumbar type vertebrae. Alignment: Dextrocurvature of the lumbar spine. Trace anterolisthesis of L4 on L5, unchanged compared to the prior exam. Vertebrae: Osteopenia.  No acute fracture or pathologic process. Paraspinal and other soft tissues: Aortic calcifications. Large right renal mass, better evaluated on 06/23/2021 and incompletely imaged here. Degenerative changes in the bilateral sacroiliac joints. Disc levels: Disc height loss and mild disc bulges, without significant spinal canal stenosis. No high-grade neural foraminal narrowing. IMPRESSION: CT THORACIC SPINE IMPRESSION 1. No acute fracture or traumatic listhesis. 2. Redemonstrated 11 mm solid pulmonary nodule in the right upper lobe, as seen on the 06/23/2021 chest CT. A previously noted right middle lobe nodule is not included in the field of view. CT LUMBAR SPINE IMPRESSION 1. No acute fracture or traumatic listhesis. 2. Incompletely imaged right renal mass, better evaluated on 06/23/2021. Electronically Signed   By: Merilyn Baba M.D.   On: 07/23/2021 13:03   CT Lumbar Spine Wo Contrast  Result Date: 07/23/2021 CLINICAL DATA:  Back trauma, fall, poor historian EXAM: CT THORACIC AND LUMBAR SPINE WITHOUT  CONTRAST TECHNIQUE: Multidetector CT imaging of the thoracic and lumbar spine was performed without contrast. Multiplanar CT image reconstructions were also generated. COMPARISON:  No prior CT of the thoracic and lumbar spine, correlation is made with CT chest abdomen pelvis 06/23/2021 FINDINGS: CT THORACIC SPINE FINDINGS Alignment: Dextrocurvature of the upper thoracic spine and levocurvature of the lower thoracic spine, with dextrocurvature of the lumbar spine. No listhesis. Vertebrae: Osteopenia. No acute fracture or suspicious osseous lesion. No fracture in the imaged ribs. Paraspinal and other soft tissues: Solid pulmonary nodule in the anterior right upper lobe, measuring up to 1.1 cm (series 6, image 50), which appears unchanged compared to 06/23/2021 the previously noted right middle lobe nodule is not included in the field view. No pleural effusion. No pneumothorax visualized. Large right renal lesion, incompletely imaged. Disc levels: Mild degenerative changes without significant spinal canal stenosis. CT LUMBAR SPINE FINDINGS Segmentation: 5 lumbar type vertebrae. Alignment: Dextrocurvature of the lumbar spine. Trace anterolisthesis of L4 on L5, unchanged compared to the prior exam. Vertebrae: Osteopenia.  No acute fracture or pathologic process. Paraspinal and other soft tissues: Aortic calcifications. Large right renal mass, better evaluated on 06/23/2021 and incompletely imaged here. Degenerative changes in the bilateral sacroiliac joints. Disc levels: Disc height loss and mild disc bulges, without significant spinal canal stenosis. No high-grade neural foraminal narrowing. IMPRESSION: CT THORACIC SPINE IMPRESSION 1. No acute fracture or traumatic listhesis. 2. Redemonstrated 11 mm solid pulmonary nodule in the right upper lobe, as seen on the 06/23/2021 chest CT. A previously noted right middle lobe nodule is not included in the field of view. CT LUMBAR SPINE IMPRESSION 1. No acute fracture or  traumatic listhesis. 2. Incompletely imaged right renal mass, better evaluated on 06/23/2021. Electronically Signed   By: Merilyn Baba M.D.   On: 07/23/2021 13:03   DG Chest Portable 1 View  Result Date: 07/23/2021 CLINICAL DATA:  Weakness. History of metastatic renal cell carcinoma. EXAM: PORTABLE CHEST 1 VIEW COMPARISON:  03/06/2021, 06/23/2021 FINDINGS: Mild cardiomegaly, stable. Atherosclerotic calcification of the aortic knob. Nodular opacity in the periphery of the right mid lung, not appreciably changed compared to the prior CT, when accounting for differences in technique. No new focal airspace consolidation. No pleural  effusion or pneumothorax. IMPRESSION: 1. No acute cardiopulmonary abnormality. 2. Nodular opacity in the periphery of the right mid lung, not appreciably changed compared to the prior CT, when accounting for differences in technique. Electronically Signed   By: Davina Poke D.O.   On: 07/23/2021 13:15        Subjective: Pt working with OT, sitting up EOB when seen this AM.  She is a bit confused but more alert and interactive than yesterday.  Staff report her mental status continues to wax and wane, in level of alertness.  No acute events reported.  Patient reports she feels fine.     Discharge Exam: Vitals:   07/28/21 0739 07/28/21 1110  BP: 121/65 102/63  Pulse: 84 86  Resp: 18 18  Temp: 97.6 F (36.4 C) 98.4 F (36.9 C)  SpO2: 94% 96%   Vitals:   07/27/21 2102 07/28/21 0454 07/28/21 0739 07/28/21 1110  BP: 121/66 (!) 105/50 121/65 102/63  Pulse: 93 74 84 86  Resp: 18 17 18 18   Temp: 98.7 F (37.1 C) (!) 97.4 F (36.3 C) 97.6 F (36.4 C) 98.4 F (36.9 C)  TempSrc: Oral Oral    SpO2: 97% 93% 94% 96%  Weight:      Height:        General: Pt is alert, awake, not in acute distress, confused but interacts appropriately, responses to questions and  delayed Cardiovascular: irregular rhythm, rate controlled, S1/S2 +, no rubs, no gallops Respiratory:  CTA bilaterally, no wheezing, no rhonchi Abdominal: Soft, NT, ND, bowel sounds + Extremities: no edema, no cyanosis Neurologic: CN's grossly intact, normal speech, no focal motor deficits   The results of significant diagnostics from this hospitalization (including imaging, microbiology, ancillary and laboratory) are listed below for reference.     Microbiology: Recent Results (from the past 240 hour(s))  Culture, blood (routine x 2)     Status: None   Collection Time: 07/23/21 11:48 AM   Specimen: BLOOD  Result Value Ref Range Status   Specimen Description BLOOD LEFT ANTECUBITAL  Final   Special Requests   Final    BOTTLES DRAWN AEROBIC AND ANAEROBIC Blood Culture results may not be optimal due to an excessive volume of blood received in culture bottles   Culture   Final    NO GROWTH 5 DAYS Performed at [facility], [address] [place], [place] [address]    Report Status 07/28/2021 FINAL  Final  Culture, blood (routine x 2)     Status: None   Collection Time: 07/23/21 12:13 PM   Specimen: BLOOD  Result Value Ref Range Status   Specimen Description BLOOD BLOOD RIGHT ARM  Final   Special Requests   Final    BOTTLES DRAWN AEROBIC AND ANAEROBIC Blood Culture adequate volume   Culture   Final    NO GROWTH 5 DAYS Performed at [facility], [address] [name], [place] [address]    Report Status 07/28/2021 FINAL  Final  Resp Panel by RT-PCR (Flu A&B, Covid) Nasopharyngeal Swab     Status: None   Collection Time: 07/23/21 12:13 PM   Specimen: Nasopharyngeal Swab; Nasopharyngeal(NP) swabs in vial transport medium  Result Value Ref Range Status   SARS Coronavirus 2 by RT PCR NEGATIVE NEGATIVE Final    Comment: (NOTE) SARS-CoV-2 target nucleic acids are NOT DETECTED.  The SARS-CoV-2 RNA is generally detectable in upper respiratory specimens during the acute phase of infection. The lowest concentration of SARS-CoV-2 viral copies  this assay can detect  is 138 copies/mL. A negative result does not preclude SARS-Cov-2 infection and should not be used as the sole basis for treatment or other patient management decisions. A negative result may occur with  improper specimen collection/handling, submission of specimen other than nasopharyngeal swab, presence of viral mutation(s) within the areas targeted by this assay, and inadequate number of viral copies(<138 copies/mL). A negative result must be combined with clinical observations, patient history, and epidemiological information. The expected result is Negative.  Fact Sheet for Patients:  EntrepreneurPulse.com.au  Fact Sheet for Healthcare Providers:  IncredibleEmployment.be  This test is no t yet approved or cleared by the [place] FDA and  has been authorized for detection and/or diagnosis of SARS-CoV-2 by FDA under an Emergency Use Authorization (EUA). This EUA will remain  in effect (meaning this test can be used) for the duration of the COVID-19 declaration under Section 564(b)(1) of the Act, 21 U.S.C.section 360bbb-3(b)(1), unless the authorization is terminated  or revoked sooner.       Influenza A by PCR NEGATIVE NEGATIVE Final   Influenza B by PCR NEGATIVE NEGATIVE Final    Comment: (NOTE) The Xpert Xpress SARS-CoV-2/FLU/RSV plus assay is intended as an aid in the diagnosis of influenza from Nasopharyngeal swab specimens and should not be used as a sole basis for treatment. Nasal washings and aspirates are unacceptable for Xpert Xpress SARS-CoV-2/FLU/RSV testing.  Fact Sheet for Patients: EntrepreneurPulse.com.au  Fact Sheet for Healthcare Providers: IncredibleEmployment.be  This test is not yet approved or cleared by the [place] FDA and has been authorized for detection and/or diagnosis of SARS-CoV-2 by FDA under an Emergency Use Authorization (EUA). This EUA will remain in effect  (meaning this test can be used) for the duration of the COVID-19 declaration under Section 564(b)(1) of the Act, 21 U.S.C. section 360bbb-3(b)(1), unless the authorization is terminated or revoked.  Performed at [facility], [address] [place], [place] [address]      Labs: BNP (last 3 results) Recent Labs    07/23/21 1148  BNP 503.8*   Basic Metabolic Panel: Recent Labs  Lab 07/24/21 0454 07/25/21 0554 07/26/21 0459 07/27/21 0628 07/28/21 0808  NA 135 136 139 136 135  K 3.6 3.4* 3.8 4.1 4.1  CL 105 104 104 100 101  CO2 22 25 29 27 27   GLUCOSE 254* 192* 191* 187* 205*  BUN 12 9 13 11 22   CREATININE 0.43* 0.32* 0.46 0.43* 0.45  CALCIUM 9.8 10.1 10.4* 10.5* 10.3  MG  --   --  1.6* 1.8 1.8   Liver Function Tests: Recent Labs  Lab 07/23/21 1148  AST 20  ALT 12  ALKPHOS 95  BILITOT 1.8*  PROT 7.1  ALBUMIN 3.7   No results for input(s): LIPASE, AMYLASE in the last 168 hours. No results for input(s): AMMONIA in the last 168 hours. CBC: Recent Labs  Lab 07/23/21 1148 07/25/21 0554 07/28/21 0808  WBC 6.7 6.8 7.9  NEUTROABS 5.0  --   --   HGB 15.4* 13.4 13.6  HCT 48.4* 41.7 41.6  MCV 83.0 81.1 82.5  PLT 173 167 218   Cardiac Enzymes: Recent Labs  Lab 07/23/21 1148  CKTOTAL 39   BNP: Invalid input(s): POCBNP CBG: Recent Labs  Lab 07/27/21 1550 07/27/21 2107 07/27/21 2347 07/28/21 0735 07/28/21 1109  GLUCAP 203* 255* 198* 202* 297*   D-Dimer No results for input(s): DDIMER in the last 72 hours. Hgb A1c No results for input(s):  HGBA1C in the last 72 hours. Lipid Profile No results for input(s): CHOL, HDL, LDLCALC, TRIG, CHOLHDL, LDLDIRECT in the last 72 hours. Thyroid function studies No results for input(s): TSH, T4TOTAL, T3FREE, THYROIDAB in the last 72 hours.  Invalid input(s): FREET3 Anemia work up No results for input(s): VITAMINB12, FOLATE, FERRITIN, TIBC, IRON, RETICCTPCT in the last 72 hours. Urinalysis     Component Value Date/Time   COLORURINE YELLOW (A) 07/23/2021 1754   APPEARANCEUR HAZY (A) 07/23/2021 1754   LABSPEC 1.032 (H) 07/23/2021 1754   PHURINE 5.0 07/23/2021 1754   GLUCOSEU >=500 (A) 07/23/2021 1754   HGBUR NEGATIVE 07/23/2021 1754   BILIRUBINUR NEGATIVE 07/23/2021 1754   KETONESUR 80 (A) 07/23/2021 1754   PROTEINUR NEGATIVE 07/23/2021 1754   NITRITE NEGATIVE 07/23/2021 1754   LEUKOCYTESUR NEGATIVE 07/23/2021 1754   Sepsis Labs Invalid input(s): PROCALCITONIN,  WBC,  LACTICIDVEN Microbiology Recent Results (from the past 240 hour(s))  Culture, blood (routine x 2)     Status: None   Collection Time: 07/23/21 11:48 AM   Specimen: BLOOD  Result Value Ref Range Status   Specimen Description BLOOD LEFT ANTECUBITAL  Final   Special Requests   Final    BOTTLES DRAWN AEROBIC AND ANAEROBIC Blood Culture results may not be optimal due to an excessive volume of blood received in culture bottles   Culture   Final    NO GROWTH 5 DAYS Performed at [facility], [address], [place], [place] [address]    Report Status 07/28/2021 FINAL  Final  Culture, blood (routine x 2)     Status: None   Collection Time: 07/23/21 12:13 PM   Specimen: BLOOD  Result Value Ref Range Status   Specimen Description BLOOD BLOOD RIGHT ARM  Final   Special Requests   Final    BOTTLES DRAWN AEROBIC AND ANAEROBIC Blood Culture adequate volume   Culture   Final    NO GROWTH 5 DAYS Performed at [facility], [address] [place], [place] [address]    Report Status 07/28/2021 FINAL  Final  Resp Panel by RT-PCR (Flu A&B, Covid) Nasopharyngeal Swab     Status: None   Collection Time: 07/23/21 12:13 PM   Specimen: Nasopharyngeal Swab; Nasopharyngeal(NP) swabs in vial transport medium  Result Value Ref Range Status   SARS Coronavirus 2 by RT PCR NEGATIVE NEGATIVE Final    Comment: (NOTE) SARS-CoV-2 target nucleic acids are NOT DETECTED.  The SARS-CoV-2 RNA is generally  detectable in upper respiratory specimens during the acute phase of infection. The lowest concentration of SARS-CoV-2 viral copies this assay can detect is 138 copies/mL. A negative result does not preclude SARS-Cov-2 infection and should not be used as the sole basis for treatment or other patient management decisions. A negative result may occur with  improper specimen collection/handling, submission of specimen other than nasopharyngeal swab, presence of viral mutation(s) within the areas targeted by this assay, and inadequate number of viral copies(<138 copies/mL). A negative result must be combined with clinical observations, patient history, and epidemiological information. The expected result is Negative.  Fact Sheet for Patients:  EntrepreneurPulse.com.au  Fact Sheet for Healthcare Providers:  IncredibleEmployment.be  This test is no t yet approved or cleared by the [place] FDA and  has been authorized for detection and/or diagnosis of SARS-CoV-2 by FDA under an Emergency Use Authorization (EUA). This EUA will remain  in effect (meaning this test can be used) for the duration of the COVID-19 declaration under Section  564(b)(1) of the Act, 21 U.S.C.section 360bbb-3(b)(1), unless the authorization is terminated  or revoked sooner.       Influenza A by PCR NEGATIVE NEGATIVE Final   Influenza B by PCR NEGATIVE NEGATIVE Final    Comment: (NOTE) The Xpert Xpress SARS-CoV-2/FLU/RSV plus assay is intended as an aid in the diagnosis of influenza from Nasopharyngeal swab specimens and should not be used as a sole basis for treatment. Nasal washings and aspirates are unacceptable for Xpert Xpress SARS-CoV-2/FLU/RSV testing.  Fact Sheet for Patients: EntrepreneurPulse.com.au  Fact Sheet for Healthcare Providers: IncredibleEmployment.be  This test is not yet approved or cleared by the [place] FDA  and has been authorized for detection and/or diagnosis of SARS-CoV-2 by FDA under an Emergency Use Authorization (EUA). This EUA will remain in effect (meaning this test can be used) for the duration of the COVID-19 declaration under Section 564(b)(1) of the Act, 21 U.S.C. section 360bbb-3(b)(1), unless the authorization is terminated or revoked.  Performed at [facility], [address] [place], [place] [address]      Time coordinating discharge: Over 30 minutes  SIGNED:   Ezekiel Slocumb, DO [facility] 07/28/2021, 1:15 PM   If 7PM-7AM, please contact night-coverage [email or web address]

## 2021-07-28 NOTE — TOC Progression Note (Signed)
Transition of Care [facility]) - Progression Note    Patient Details  Name: Barbara Frank MRN: [id number] Date of Birth: 1946-05-02  [facility]) CM/SW Contact  Eileen Stanford, LCSW Phone Number: 07/28/2021, 4:20 PM  Clinical Narrative:  Pt has to have BM prior to dc to SNF.      Expected Discharge Plan: [place] Barriers to Discharge: Continued Medical Work up  Expected Discharge Plan and Services Expected Discharge Plan: [place] arrangements for the past 2 months: [place] Expected Discharge Date: 07/28/21                                     Social Determinants of Health (SDOH) Interventions    Readmission Risk Interventions No flowsheet data found.

## 2021-07-28 NOTE — Progress Notes (Signed)
Inpatient Diabetes Program Recommendations  AACE/ADA: New Consensus Statement on Inpatient Glycemic Control  Target Ranges:  Prepandial:   less than 140 mg/dL      [facility] postprandial:   less than 180 mg/dL (1-2 hours)      Critically ill patients:  140 - 180 mg/dL    Latest Reference Range & Units 07/27/21 08:29 07/27/21 11:43 07/27/21 15:50 07/27/21 21:07 07/27/21 23:47 07/28/21 07:35  Glucose-Capillary 70 - 99 mg/dL 198 (H) 243 (H) 203 (H) 255 (H) 198 (H) 202 (H)  (H): Data is abnormally high  Review of Glycemic Control  Diabetes history: DM2 Outpatient Diabetes medications: Lantus 25 units BID, Metformin XR 2000 mg daily Current orders for Inpatient glycemic control: Semglee 27 units BID, Novolog 3 units TID with meals, Novolog 0-9 units TID with meals, Novolog 0-5 units QHS   Inpatient Diabetes Program Recommendations:     Insulin: Please consider increasing meal coverage to Novolog 5 units TID with meals if patient eats at least 50% of meals.  Thanks, Barnie Alderman, RN, MSN, CDE Diabetes Coordinator Inpatient Diabetes Program [phone] (Team Pager from 8am to 5pm)

## 2021-07-28 NOTE — TOC Progression Note (Addendum)
Transition of Care [facility]) - Progression Note    Patient Details  Name: [name] MRN: [id number] Date of Birth: 1945/11/08  [facility]) CM/SW Contact  Eileen Stanford, LCSW Phone Number: 07/28/2021, 10:00 AM  Clinical Narrative:   Spoke with pt's Niece and she is agreeable for pt to go to [facility] as she has been there before. Niece has been unable to reach pt for the last few months. Pt's Niece is hopeful she can reach her in her room. RN notified to please make sure pt has her room phone.  Auth started for [facility].  Expected Discharge Plan: [name] Barriers to Discharge: Continued Medical Work up  Expected Discharge Plan and Services Expected Discharge Plan: [place] arrangements for the past 2 months: [place]                                       Social Determinants of Health (SDOH) Interventions    Readmission Risk Interventions No flowsheet data found.

## 2021-07-29 ENCOUNTER — Other Ambulatory Visit (HOSPITAL_COMMUNITY): Payer: Self-pay

## 2021-07-29 DIAGNOSIS — I4891 Unspecified atrial fibrillation: Secondary | ICD-10-CM | POA: Diagnosis not present

## 2021-07-29 DIAGNOSIS — R7989 Other specified abnormal findings of blood chemistry: Secondary | ICD-10-CM | POA: Diagnosis not present

## 2021-07-29 DIAGNOSIS — R531 Weakness: Secondary | ICD-10-CM

## 2021-07-29 DIAGNOSIS — F320 Major depressive disorder, single episode, mild: Secondary | ICD-10-CM | POA: Diagnosis not present

## 2021-07-29 DIAGNOSIS — C649 Malignant neoplasm of unspecified kidney, except renal pelvis: Secondary | ICD-10-CM

## 2021-07-29 LAB — GLUCOSE, CAPILLARY
Glucose-Capillary: 265 mg/dL — ABNORMAL HIGH (ref 70–99)
Glucose-Capillary: 262 mg/dL — ABNORMAL HIGH (ref 70–99)
Glucose-Capillary: 161 mg/dL — ABNORMAL HIGH (ref 70–99)
Glucose-Capillary: 70 mg/dL (ref 70–99)
Glucose-Capillary: 73 mg/dL (ref 70–99)

## 2021-07-29 LAB — PTH, INTACT AND CALCIUM
Calcium, Total (PTH): 10.4 mg/dL — ABNORMAL HIGH (ref 8.7–10.3)
PTH: 66 pg/mL — ABNORMAL HIGH (ref 15–65)

## 2021-07-29 MED ORDER — LACTULOSE 10 GM/15ML PO SOLN
30.0000 g | Freq: Every day | ORAL | Status: DC
  Administered 2021-07-29 – 2021-07-31 (×3): 30 g via ORAL
  Filled 2021-07-29 (×3): qty 60

## 2021-07-29 MED ORDER — LACTULOSE 10 GM/15ML PO SOLN
30.0000 g | Freq: Every day | ORAL | Status: DC | PRN
  Administered 2021-07-29: 30 g via ORAL
  Filled 2021-07-29: qty 60

## 2021-07-29 MED ORDER — SENNA 8.6 MG PO TABS
1.0000 | ORAL_TABLET | Freq: Every day | ORAL | Status: DC
Start: 2021-07-29 — End: 2021-07-31
  Administered 2021-07-29 – 2021-07-31 (×3): 8.6 mg via ORAL
  Filled 2021-07-29 (×3): qty 1

## 2021-07-29 MED ORDER — MAGNESIUM HYDROXIDE 400 MG/5ML PO SUSP
960.0000 mL | Freq: Once | ORAL | Status: AC
  Administered 2021-07-29: 960 mL via RECTAL
  Filled 2021-07-29: qty 473

## 2021-07-29 NOTE — Plan of Care (Signed)
  Problem: Education: Goal: Knowledge of General Education information will improve Description: Including pain rating scale, medication(s)/side effects and non-pharmacologic comfort measures 07/29/2021 1245 by Cristela Blue, RN Outcome: Progressing 07/29/2021 1245 by Cristela Blue, RN Outcome: Progressing   Problem: Education: Goal: Knowledge of disease or condition will improve 07/29/2021 1245 by Cristela Blue, RN Outcome: Progressing 07/29/2021 1245 by Cristela Blue, RN Outcome: Progressing Goal: Understanding of medication regimen will improve 07/29/2021 1245 by Cristela Blue, RN Outcome: Progressing 07/29/2021 1245 by Cristela Blue, RN Outcome: Progressing Goal: Individualized Educational Video(s) 07/29/2021 1245 by Cristela Blue, RN Outcome: Progressing 07/29/2021 1245 by Cristela Blue, RN Outcome: Progressing   Problem: Activity: Goal: Ability to tolerate increased activity will improve 07/29/2021 1245 by Cristela Blue, RN Outcome: Progressing 07/29/2021 1245 by Cristela Blue, RN Outcome: Progressing   Problem: Cardiac: Goal: Ability to achieve and maintain adequate cardiopulmonary perfusion will improve 07/29/2021 1245 by Cristela Blue, RN Outcome: Progressing 07/29/2021 1245 by Cristela Blue, RN Outcome: Progressing   Problem: Health Behavior/Discharge Planning: Goal: Ability to safely manage health-related needs after discharge will improve 07/29/2021 1245 by Cristela Blue, RN Outcome: Progressing 07/29/2021 1245 by Cristela Blue, RN Outcome: Progressing

## 2021-07-29 NOTE — TOC Progression Note (Signed)
Transition of Care [facility]) - Progression Note    Patient Details  Name: Barbara Frank MRN: [id number] Date of Birth: Apr 25, 1946  Transition of Care [facility]) CM/SW [place], LCSW Phone Number: 07/29/2021, 8:26 AM  Clinical Narrative:   Insurance authorization approved: [id number]. Valid 11/22-11/28. Per RN, no BM yet. Telesitter was ordered last night for agitation. RN said she still has it. [facility] admissions coordinator confirmed they are accepting patients tomorrow. Sent secure chat to MD requesting that discharge order be discontinued as well as telesitter if she does not need it.  Expected Discharge Plan: [place] Barriers to Discharge: Continued Medical Work up  Expected Discharge Plan and Services Expected Discharge Plan: [place] arrangements for the past 2 months: [place] Expected Discharge Date: 07/28/21                                     Social Determinants of Health (SDOH) Interventions    Readmission Risk Interventions No flowsheet data found.

## 2021-07-29 NOTE — Progress Notes (Signed)
Nutrition Follow-up  DOCUMENTATION CODES:   Not applicable  INTERVENTION:   -Continue Magic cup BID with meals, each supplement provides 290 kcal and 9 grams of protein  -Continue MVI with minerals daily  NUTRITION DIAGNOSIS:   Increased nutrient needs related to acute illness as evidenced by estimated needs.  Ongoing  GOAL:   Patient will meet greater than or equal to 90% of their needs  Progressing   MONITOR:   PO intake, Supplement acceptance, Labs, Weight trends, Skin, I & O's  REASON FOR ASSESSMENT:   Malnutrition Screening Tool    ASSESSMENT:   Barbara Frank is a 75 y.o. female with medical history significant of diabetes mellitus, atrial fibrillation on Eliquis, depression, renal cell carcinoma, lung nodule, who presents with fall.  Reviewed I/O's: +480 ml x 24 hours and +1.8 L since admission   Pt unable to provide history. She continues to have a good appetite. Noted meal completions 50-100%.   Noted pt with no BM since 07/22/21; pt is on bowel regimen and nursing recent gave pt an enema.   Per MD notes, pt is medically stable for discharge and is awaiting SNF placement.   Medications reviewed and include cardizem, lactulose, miralax, and senokot.    Lab Results  Component Value Date   HGBA1C 10.3 (H) 07/24/2021   PTA DM medications are .   Labs reviewed: CBGS: [id number] (inpatient orders for glycemic control are 0-5 units insulin aspart TID with meals, 3 units insulin aspart TID with meals, and 27 units insulin glargine-yfgn BID).    Diet Order:   Diet Order             Diet - low sodium heart healthy           Diet heart healthy/carb modified Room service appropriate? Yes; Fluid consistency: Thin  Diet effective now                   EDUCATION NEEDS:   Education needs have been addressed  Skin:  Skin Assessment: Reviewed RN Assessment  Last BM:  07/22/21  Height:   Ht Readings from Last 1 Encounters:  07/23/21 5\' 4"  (1.626 m)     Weight:   Wt Readings from Last 1 Encounters:  07/29/21 70.1 kg    Ideal Body Weight:  54.5 kg  BMI:  Body mass index is 26.53 kg/m.  Estimated Nutritional Needs:   Kcal:  1850-2050  Protein:  95-110 grams  Fluid:  > 1.8 L    Loistine Chance, RD, LDN, [place] Registered Dietitian II Certified Diabetes Care and Education Specialist Please refer to [facility] for RD and/or RD on-call/weekend/after hours pager

## 2021-07-29 NOTE — Progress Notes (Signed)
OT Cancellation Note  Patient Details Name: Barbara Frank MRN: [id number] DOB: 06/11/1946   Cancelled Treatment:    Reason Eval/Treat Not Completed: Other (comment) (pt unavailable d/t SN giving pt an enema)  Leta Speller, MS, OTR/L  Darleene Cleaver 07/29/2021, 10:35 AM

## 2021-07-29 NOTE — Progress Notes (Signed)
PROGRESS NOTE  Barbara Frank    DOB: 19-Jul-1946, 75 y.o.  SNK:[id number]  PCP: Sheral Apley   Code Status: Full Code   DOA: 07/23/2021   LOS: 2  Brief Narrative of Current Hospitalization  Barbara Frank is a 75 y.o. female with a PMH significant for type II DM, A. fib on Eliquis, depression, RCC, lung nodule. They presented from independent living facility to the ED on 07/23/2021 with fall with dizziness. In the ED, it was found that they had A. fib with RVR. They were treated with Cardizem.  Patient was admitted to medicine service for further workup and management of A. fib as outlined in detail below.  07/29/21 -stable  Assessment & Plan  Principal Problem:   Atrial fibrillation with RVR (HCC) Active Problems:   Depression   Renal cell carcinoma (HCC)   Type 2 diabetes mellitus without complication, with long-term current use of insulin ()   Fall at home, initial encounter   Lung nodule   Elevated lactic acid level   Prolonged QT interval  A. fib with RVR-resolved.  Heart rate well controlled. -Continue home Cardizem at increased dose of 240 mg -Continue carvedilol 12.5 mg twice daily -Continue Eliquis -Continue telemetry  Constipation-discharge to SNF is pending BM.  No charted BM since admission 11/17.  Patient is not complaining of abdominal discomfort. - miralax -Lactulose -Smog enema  Dementia without behavioral disturbances  delirium in setting of acute illness  chronic depression-  Patient has not had acute setting of agitation in several days. -Telemetry sitter discontinued -Delirium precautions -Holding Lexapro for prolonged QT -Repeat ECG  HTN-well-controlled -Continue Cardizem, carvedilol as above  Hypomagnesemia  hypercalcemia-magnesium 1.8 yesterday -PTH pending  S/p fall -PT/OT  Abdominal intertrigo dermatitis-  -continue topical miconazole powder  DVT prophylaxis:  apixaban (ELIQUIS) tablet 5 mg   Diet:  Diet Orders (From  admission, onward)     Start     Ordered   07/28/21 0000  Diet - low sodium heart healthy        07/28/21 1314   07/23/21 1359  Diet heart healthy/carb modified Room service appropriate? Yes; Fluid consistency: Thin  Diet effective now       Question Answer Comment  Diet-HS Snack? Nothing   Room service appropriate? Yes   Fluid consistency: Thin      07/23/21 1358            Subjective 07/29/21    She only awakens briefly for conversation and then falls back asleep but she denies any complaints at this time.  Disposition Plan & Communication  Patient status: Inpatient  Admitted From: ALF Disposition: Skilled nursing facility Anticipated discharge date: 11.24  Family Communication: None Consults, Procedures, Significant Events  Consultants:  None  Procedures/significant events:  None Antimicrobials:  Anti-infectives (From admission, onward)    Start     Dose/Rate Route Frequency Ordered Stop   07/23/21 1330  cefTRIAXone (ROCEPHIN) 1 g in sodium chloride 0.9 % 100 mL IVPB        1 g 200 mL/hr over 30 Minutes Intravenous  Once 07/23/21 1329 07/23/21 1408       Objective   Vitals:   07/28/21 2019 07/29/21 0039 07/29/21 0456 07/29/21 0841  BP:  (!) 125/55 122/74 131/70  Pulse:  84 83 72  Resp:  19 19 18   Temp:  98 F (36.7 C) 98.8 F (37.1 C) 99 F (37.2 C)  TempSrc:      SpO2: 95% 94% 97%  Weight:   70.1 kg   Height:        Intake/Output Summary (Last 24 hours) at 07/29/2021 0940 Last data filed at 07/28/2021 2216 Gross per 24 hour  Intake 480 ml  Output --  Net 480 ml   Filed Weights   07/23/21 1143 07/29/21 0456  Weight: 73 kg 70.1 kg    Patient BMI: Body mass index is 26.53 kg/m.   Physical Exam: General: Asleep, NAD HEENT: atraumatic, clear conjunctiva, anicteric sclera, moist mucus membranes, hearing grossly normal Respiratory: normal respiratory effort. Cardiovascular: quick capillary refill  Gastrointestinal: soft, NT, ND, no HSM  felt Nervous: Alert to voice.  Extremities: moves all equally, trace edema, normal tone Skin: dry, intact, normal temperature, normal color, No rashes, lesions or ulcers on exposed skin  Labs   I have personally reviewed following labs and imaging studies Admission on 07/23/2021  Component Date Value Ref Range Status   Sodium 07/23/2021 136  135 - 145 mmol/L Final   Potassium 07/23/2021 4.1  3.5 - 5.1 mmol/L Final   Chloride 07/23/2021 100  98 - 111 mmol/L Final   CO2 07/23/2021 25  22 - 32 mmol/L Final   Glucose, Bld 07/23/2021 291 (H)  70 - 99 mg/dL Final   BUN 07/23/2021 12  8 - 23 mg/dL Final   Creatinine, Ser 07/23/2021 0.59  0.44 - 1.00 mg/dL Final   Calcium 07/23/2021 10.3  8.9 - 10.3 mg/dL Final   Total Protein 07/23/2021 7.1  6.5 - 8.1 g/dL Final   Albumin 07/23/2021 3.7  3.5 - 5.0 g/dL Final   AST 07/23/2021 20  15 - 41 U/L Final   ALT 07/23/2021 12  0 - 44 U/L Final   Alkaline Phosphatase 07/23/2021 95  38 - 126 U/L Final   Total Bilirubin 07/23/2021 1.8 (H)  0.3 - 1.2 mg/dL Final   GFR, Estimated 07/23/2021 >60  >60 mL/min Final   Anion gap 07/23/2021 11  5 - 15 Final   WBC 07/23/2021 6.7  4.0 - 10.5 K/uL Final   RBC 07/23/2021 5.83 (H)  3.87 - 5.11 MIL/uL Final   Hemoglobin 07/23/2021 15.4 (H)  12.0 - 15.0 g/dL Final   HCT 07/23/2021 48.4 (H)  36.0 - 46.0 % Final   MCV 07/23/2021 83.0  80.0 - 100.0 fL Final   MCH 07/23/2021 26.4  26.0 - 34.0 pg Final   MCHC 07/23/2021 31.8  30.0 - 36.0 g/dL Final   RDW 07/23/2021 14.3  11.5 - 15.5 % Final   Platelets 07/23/2021 173  150 - 400 K/uL Final   nRBC 07/23/2021 0.0  0.0 - 0.2 % Final   Neutrophils Relative % 07/23/2021 76  % Final   Neutro Abs 07/23/2021 5.0  1.7 - 7.7 K/uL Final   Lymphocytes Relative 07/23/2021 16  % Final   Lymphs Abs 07/23/2021 1.1  0.7 - 4.0 K/uL Final   Monocytes Relative 07/23/2021 8  % Final   Monocytes Absolute 07/23/2021 0.5  0.1 - 1.0 K/uL Final   Eosinophils Relative 07/23/2021 0  % Final    Eosinophils Absolute 07/23/2021 0.0  0.0 - 0.5 K/uL Final   Basophils Relative 07/23/2021 0  % Final   Basophils Absolute 07/23/2021 0.0  0.0 - 0.1 K/uL Final   Immature Granulocytes 07/23/2021 0  % Final   Abs Immature Granulocytes 07/23/2021 0.02  0.00 - 0.07 K/uL Final   Troponin I (High Sensitivity) 07/23/2021 9  <18 ng/L Final   Lactic Acid, Venous 07/23/2021  2.4 (HH)  0.5 - 1.9 mmol/L Final   B Natriuretic Peptide 07/23/2021 164.6 (H)  0.0 - 100.0 pg/mL Final   Total CK 07/23/2021 39  38 - 234 U/L Final   Specimen Description 07/23/2021 BLOOD LEFT ANTECUBITAL   Final   Special Requests 07/23/2021 BOTTLES DRAWN AEROBIC AND ANAEROBIC Blood Culture results may not be optimal due to an excessive volume of blood received in culture bottles   Final   Culture 07/23/2021    Final                   Value:NO GROWTH 5 DAYS Performed at [facility], [address] [place] [address]    Report Status 07/23/2021 07/28/2021 FINAL   Final   Specimen Description 07/23/2021 BLOOD BLOOD RIGHT ARM   Final   Special Requests 07/23/2021 BOTTLES DRAWN AEROBIC AND ANAEROBIC Blood Culture adequate volume   Final   Culture 07/23/2021    Final                   Value:NO GROWTH 5 DAYS Performed at [facility], [address] [place], [place] [address]    Report Status 07/23/2021 07/28/2021 FINAL   Final   Color, Urine 07/23/2021 YELLOW (A)  YELLOW Final   APPearance 07/23/2021 HAZY (A)  CLEAR Final   Specific Gravity, Urine 07/23/2021 1.032 (H)  1.005 - 1.030 Final   pH 07/23/2021 5.0  5.0 - 8.0 Final   Glucose, UA 07/23/2021 >=500 (A)  NEGATIVE mg/dL Final   Hgb urine dipstick 07/23/2021 NEGATIVE  NEGATIVE Final   Bilirubin Urine 07/23/2021 NEGATIVE  NEGATIVE Final   Ketones, ur 07/23/2021 80 (A)  NEGATIVE mg/dL Final   Protein, ur 07/23/2021 NEGATIVE  NEGATIVE mg/dL Final   Nitrite 07/23/2021 NEGATIVE  NEGATIVE Final   Leukocytes,Ua 07/23/2021 NEGATIVE  NEGATIVE Final    RBC / HPF 07/23/2021 0-5  0 - 5 RBC/hpf Final   WBC, UA 07/23/2021 0-5  0 - 5 WBC/hpf Final   Bacteria, UA 07/23/2021 FEW (A)  NONE SEEN Final   Squamous Epithelial / LPF 07/23/2021 0-5  0 - 5 Final   Mucus 07/23/2021 PRESENT   Final   SARS Coronavirus 2 by RT PCR 07/23/2021 NEGATIVE  NEGATIVE Final   Influenza A by PCR 07/23/2021 NEGATIVE  NEGATIVE Final   Influenza B by PCR 07/23/2021 NEGATIVE  NEGATIVE Final   Procalcitonin 07/23/2021 <0.10  ng/mL Final   Lactic Acid, Venous 07/23/2021 2.1 (HH)  0.5 - 1.9 mmol/L Final   Lactic Acid, Venous 07/23/2021 1.8  0.5 - 1.9 mmol/L Final   Sodium 07/24/2021 135  135 - 145 mmol/L Final   Potassium 07/24/2021 3.6  3.5 - 5.1 mmol/L Final   Chloride 07/24/2021 105  98 - 111 mmol/L Final   CO2 07/24/2021 22  22 - 32 mmol/L Final   Glucose, Bld 07/24/2021 254 (H)  70 - 99 mg/dL Final   BUN 07/24/2021 12  8 - 23 mg/dL Final   Creatinine, Ser 07/24/2021 0.43 (L)  0.44 - 1.00 mg/dL Final   Calcium 07/24/2021 9.8  8.9 - 10.3 mg/dL Final   GFR, Estimated 07/24/2021 >60  >60 mL/min Final   Anion gap 07/24/2021 8  5 - 15 Final   Glucose-Capillary 07/23/2021 296 (H)  70 - 99 mg/dL Final   Comment 1 07/23/2021 Notify RN   Final   Comment 2 07/23/2021 Document in Chart   Final   Glucose-Capillary 07/23/2021 271 (H)  70 - 99 mg/dL Final   Glucose-Capillary 07/24/2021 280 (H)  70 - 99 mg/dL Final   Hgb A1c MFr Bld 07/24/2021 10.3 (H)  4.8 - 5.6 % Final   Mean Plasma Glucose 07/24/2021 248.91  mg/dL Final   Glucose-Capillary 07/24/2021 290 (H)  70 - 99 mg/dL Final   Glucose-Capillary 07/24/2021 200 (H)  70 - 99 mg/dL Final   Sodium 07/25/2021 136  135 - 145 mmol/L Final   Potassium 07/25/2021 3.4 (L)  3.5 - 5.1 mmol/L Final   Chloride 07/25/2021 104  98 - 111 mmol/L Final   CO2 07/25/2021 25  22 - 32 mmol/L Final   Glucose, Bld 07/25/2021 192 (H)  70 - 99 mg/dL Final   BUN 07/25/2021 9  8 - 23 mg/dL Final   Creatinine, Ser 07/25/2021 0.32 (L)  0.44 - 1.00  mg/dL Final   Calcium 07/25/2021 10.1  8.9 - 10.3 mg/dL Final   GFR, Estimated 07/25/2021 >60  >60 mL/min Final   Anion gap 07/25/2021 7  5 - 15 Final   WBC 07/25/2021 6.8  4.0 - 10.5 K/uL Final   RBC 07/25/2021 5.14 (H)  3.87 - 5.11 MIL/uL Final   Hemoglobin 07/25/2021 13.4  12.0 - 15.0 g/dL Final   HCT 07/25/2021 41.7  36.0 - 46.0 % Final   MCV 07/25/2021 81.1  80.0 - 100.0 fL Final   MCH 07/25/2021 26.1  26.0 - 34.0 pg Final   MCHC 07/25/2021 32.1  30.0 - 36.0 g/dL Final   RDW 07/25/2021 14.4  11.5 - 15.5 % Final   Platelets 07/25/2021 167  150 - 400 K/uL Final   nRBC 07/25/2021 0.0  0.0 - 0.2 % Final   Glucose-Capillary 07/24/2021 219 (H)  70 - 99 mg/dL Final   Glucose-Capillary 07/25/2021 218 (H)  70 - 99 mg/dL Final   Glucose-Capillary 07/25/2021 249 (H)  70 - 99 mg/dL Final   Glucose-Capillary 07/25/2021 266 (H)  70 - 99 mg/dL Final   Sodium 07/26/2021 139  135 - 145 mmol/L Final   Potassium 07/26/2021 3.8  3.5 - 5.1 mmol/L Final   Chloride 07/26/2021 104  98 - 111 mmol/L Final   CO2 07/26/2021 29  22 - 32 mmol/L Final   Glucose, Bld 07/26/2021 191 (H)  70 - 99 mg/dL Final   BUN 07/26/2021 13  8 - 23 mg/dL Final   Creatinine, Ser 07/26/2021 0.46  0.44 - 1.00 mg/dL Final   Calcium 07/26/2021 10.4 (H)  8.9 - 10.3 mg/dL Final   GFR, Estimated 07/26/2021 >60  >60 mL/min Final   Anion gap 07/26/2021 6  5 - 15 Final   Magnesium 07/26/2021 1.6 (L)  1.7 - 2.4 mg/dL Final   Glucose-Capillary 07/25/2021 178 (H)  70 - 99 mg/dL Final   Glucose-Capillary 07/25/2021 160 (H)  70 - 99 mg/dL Final   Glucose-Capillary 07/26/2021 206 (H)  70 - 99 mg/dL Final   Glucose-Capillary 07/26/2021 246 (H)  70 - 99 mg/dL Final   Glucose-Capillary 07/26/2021 226 (H)  70 - 99 mg/dL Final   Magnesium 07/27/2021 1.8  1.7 - 2.4 mg/dL Final   Sodium 07/27/2021 136  135 - 145 mmol/L Final   Potassium 07/27/2021 4.1  3.5 - 5.1 mmol/L Final   Chloride 07/27/2021 100  98 - 111 mmol/L Final   CO2 07/27/2021 27   22 - 32 mmol/L Final   Glucose, Bld 07/27/2021 187 (H)  70 - 99 mg/dL Final   BUN 07/27/2021 11  8 - 23 mg/dL Final   Creatinine, Ser 07/27/2021 0.43 (L)  0.44 - 1.00 mg/dL Final   Calcium 07/27/2021 10.5 (H)  8.9 - 10.3 mg/dL Final   GFR, Estimated 07/27/2021 >60  >60 mL/min Final   Anion gap 07/27/2021 9  5 - 15 Final   Glucose-Capillary 07/26/2021 159 (H)  70 - 99 mg/dL Final   Glucose-Capillary 07/27/2021 198 (H)  70 - 99 mg/dL Final   Glucose-Capillary 07/27/2021 243 (H)  70 - 99 mg/dL Final   Glucose-Capillary 07/27/2021 203 (H)  70 - 99 mg/dL Final   Comment 1 07/27/2021 Notify RN   Final   WBC 07/28/2021 7.9  4.0 - 10.5 K/uL Final   RBC 07/28/2021 5.04  3.87 - 5.11 MIL/uL Final   Hemoglobin 07/28/2021 13.6  12.0 - 15.0 g/dL Final   HCT 07/28/2021 41.6  36.0 - 46.0 % Final   MCV 07/28/2021 82.5  80.0 - 100.0 fL Final   MCH 07/28/2021 27.0  26.0 - 34.0 pg Final   MCHC 07/28/2021 32.7  30.0 - 36.0 g/dL Final   RDW 07/28/2021 14.9  11.5 - 15.5 % Final   Platelets 07/28/2021 218  150 - 400 K/uL Final   nRBC 07/28/2021 0.0  0.0 - 0.2 % Final   Sodium 07/28/2021 135  135 - 145 mmol/L Final   Potassium 07/28/2021 4.1  3.5 - 5.1 mmol/L Final   Chloride 07/28/2021 101  98 - 111 mmol/L Final   CO2 07/28/2021 27  22 - 32 mmol/L Final   Glucose, Bld 07/28/2021 205 (H)  70 - 99 mg/dL Final   BUN 07/28/2021 22  8 - 23 mg/dL Final   Creatinine, Ser 07/28/2021 0.45  0.44 - 1.00 mg/dL Final   Calcium 07/28/2021 10.3  8.9 - 10.3 mg/dL Final   GFR, Estimated 07/28/2021 >60  >60 mL/min Final   Anion gap 07/28/2021 7  5 - 15 Final   Magnesium 07/28/2021 1.8  1.7 - 2.4 mg/dL Final   Glucose-Capillary 07/27/2021 255 (H)  70 - 99 mg/dL Final   Comment 1 07/27/2021 Notify RN   Final   Glucose-Capillary 07/27/2021 198 (H)  70 - 99 mg/dL Final   Glucose-Capillary 07/28/2021 202 (H)  70 - 99 mg/dL Final   Glucose-Capillary 07/28/2021 297 (H)  70 - 99 mg/dL Final   SARS Coronavirus 2 by RT PCR  07/28/2021 NEGATIVE  NEGATIVE Final   Influenza A by PCR 07/28/2021 NEGATIVE  NEGATIVE Final   Influenza B by PCR 07/28/2021 NEGATIVE  NEGATIVE Final   Glucose-Capillary 07/28/2021 250 (H)  70 - 99 mg/dL Final   Glucose-Capillary 07/28/2021 136 (H)  70 - 99 mg/dL Final   Comment 1 07/28/2021 Notify RN   Final   Glucose-Capillary 07/29/2021 265 (H)  70 - 99 mg/dL Final    Imaging Studies  No results found. Medications   Scheduled Meds:  apixaban  5 mg Oral BID   carvedilol  6.25 mg Oral BID WC   diltiazem  240 mg Oral QPM   insulin aspart  0-5 Units Subcutaneous QHS   insulin aspart  0-9 Units Subcutaneous TID WC   insulin aspart  3 Units Subcutaneous TID WC   insulin glargine-yfgn  27 Units Subcutaneous BID   lactulose  30 g Oral Daily   loratadine  10 mg Oral Daily   miconazole nitrate   Topical BID   multivitamin with minerals  1 tablet Oral Daily   polyethylene glycol  17 g Oral  Daily   senna  1 tablet Oral Daily   sorbitol, milk of mag, mineral oil, glycerin (SMOG) enema  960 mL Rectal Once   No recently discontinued medications to reconcile  LOS: 2 days   Time spent: >20min  Malin Sambrano L Neyda Durango, DO [facility] 07/29/2021, 9:40 AM   Please refer to amion to contact the [facility] Attending or Consulting provider for this pt  [email or web address] Available by Epic secure chat 7AM-7PM. If 7PM-7AM, please contact night-coverage

## 2021-07-29 NOTE — Progress Notes (Signed)
Inpatient Diabetes Program Recommendations  AACE/ADA: New Consensus Statement on Inpatient Glycemic Control   Target Ranges:  Prepandial:   less than 140 mg/dL      Peak postprandial:   less than 180 mg/dL (1-2 hours)      Critically ill patients:  140 - 180 mg/dL    Latest Reference Range & Units 07/28/21 07:35 07/28/21 11:09 07/28/21 15:25 07/28/21 20:15 07/29/21 08:44  Glucose-Capillary 70 - 99 mg/dL 202 (H) 297 (H) 250 (H) 136 (H) 265 (H)  (H): Data is abnormally high  Review of Glycemic Control  Outpatient Diabetes medications: Lantus 25 units BID, Metformin XR 2000 mg daily Current orders for Inpatient glycemic control: Semglee 27 units BID, Novolog 3 units TID with meals, Novolog 0-9 units TID with meals, Novolog 0-5 units QHS   Inpatient Diabetes Program Recommendations:     Insulin: Please consider increasing meal coverage to Novolog 6 units TID with meals if patient eats at least 50% of meals.   Thanks, Barnie Alderman, RN, MSN, CDE Diabetes Coordinator Inpatient Diabetes Program [phone] (Team Pager from 8am to 5pm)

## 2021-07-29 NOTE — Discharge Summary (Signed)
Physician Discharge Summary  Cindra Austad ZJI:[id number] DOB: August 20, 1946 DOA: 07/23/2021  PCP: Sheral Apley  Admit date: 07/23/2021 Discharge date: 07/30/2021  Admitted From: retirement home Disposition: Skilled nursing facility  Recommendations for Outpatient Follow-up:  Follow up with PCP within 1-2 weeks to monitor for constipation resolution Follow up with cardiology in 1-2 weeks to monitor Afib treatments  Discharge Condition:improved, stable CODE STATUS:  Code Status: Full Code  Regular healthy diet  Brief/Interim Summary: -Pt presented to the hospital with decreased mentation and dizziness and was found to have Afib with RVR. It appears that this was brought on by non-adherence to her home medications. She responded well to resuming her home medications of cardizem and carvedilol was added. Her symptoms resolved and her heart rate was well controlled for several days prior to discharge. She was asymptomatic and mental status had returned back to her baseline.  -Patient had no recorded BM since admission so had delayed discharge until she had a BM. She continued to deny painful abdomen or tenesmus. She was treated with bowel softeners and fiber supplements as well as a suppository and enema over the course of two days. She had a soft BM on day of discharge.  -She also had intertrigo of panus improved with topical miconazole.   Qtc 497  Discharge Diagnoses:  Principal Problem:   Atrial fibrillation with RVR ([place]) Active Problems:   Depression   Renal cell carcinoma (HCC)   Type 2 diabetes mellitus without complication, with long-term current use of insulin ([place])   Fall at home, initial encounter   Lung nodule   Elevated lactic acid level   Prolonged QT interval   Generalized weakness   Discharge Instructions     Call MD for:   Complete by: As directed    Heart racing / palpitations, chest pain, shortness of breath. Uncontrolled blood sugars - high or  low Uncontrolled blood pressure - if above 150/90 or below 100/60   Call MD for:  extreme fatigue   Complete by: As directed    Call MD for:  persistant dizziness or light-headedness   Complete by: As directed    Call MD for:  persistant nausea and vomiting   Complete by: As directed    Call MD for:  severe uncontrolled pain   Complete by: As directed    Call MD for:  temperature >100.4   Complete by: As directed    Diet - low sodium heart healthy   Complete by: As directed    Discharge instructions   Complete by: As directed    For your A-fib, your heart rate was uncontrolled when you were admitted to the hospital. During your stay, your blood pressure was also elevated, so we changed your medications as follows:  --increased dose of diltiazem (Cardizem) --changed your beta blocker from metoprolol to carvedilol (Coreg) to help more with BP control  Please monitor BP and HR at rehab closely.  Follow up with facility doctor and/or your Primary Care provider within 1 week of discharge.  RENAL CELL CARCINOMA -- Dr. Tasia Catchings had been holding your medication (axitinib) because of persitently high blood sugars.  Please follow up with her in clinic as previously scheduled or after rehab stay.  BLOOD SUGARS -- please watch closely and, if uncontrolled, adjust insulin dosing.   Increase activity slowly   Complete by: As directed       Allergies as of 07/30/2021       Reactions   Kiwi Extract  Medication List     STOP taking these medications    axitinib 5 MG tablet Commonly known as: INLYTA   diclofenac Sodium 1 % Gel Commonly known as: VOLTAREN   lactulose 10 GM/15ML solution Commonly known as: CHRONULAC   metoprolol succinate 50 MG 24 hr tablet Commonly known as: TOPROL-XL   nystatin powder Commonly known as: MYCOSTATIN/NYSTOP       TAKE these medications    acetaminophen 325 MG tablet Commonly known as: TYLENOL Take 2 tablets (650 mg total) by mouth every  6 (six) hours as needed for mild pain, fever or headache.   apixaban 5 MG Tabs tablet Commonly known as: ELIQUIS Take 5 mg by mouth 2 (two) times daily.   carvedilol 6.25 MG tablet Commonly known as: COREG Take 1 tablet (6.25 mg total) by mouth 2 (two) times daily with a meal.   cetirizine 10 MG tablet Commonly known as: ZYRTEC Take 5 mg by mouth daily.   diltiazem 240 MG 24 hr capsule Commonly known as: CARDIZEM CD Take 1 capsule (240 mg total) by mouth every evening. What changed:  medication strength how much to take   escitalopram 10 MG tablet Commonly known as: LEXAPRO Take 10 mg by mouth daily.   feeding supplement (GLUCERNA SHAKE) Liqd Take 237 mLs by mouth 3 (three) times daily between meals.   insulin aspart 100 UNIT/ML injection Commonly known as: novoLOG Inject 4 Units into the skin 3 (three) times daily with meals.   insulin glargine 100 UNIT/ML injection Commonly known as: LANTUS Inject 0.27 mLs (27 Units total) into the skin 2 (two) times daily. What changed: how much to take   metFORMIN 500 MG 24 hr tablet Commonly known as: GLUCOPHAGE-XR Take 1 tablet (500 mg total) by mouth 2 (two) times daily. What changed:  how much to take how to take this when to take this   miconazole nitrate Powd Commonly known as: MICATIN Apply 1 application topically 2 (two) times daily for 7 days.   multivitamin with minerals Tabs tablet Take 1 tablet by mouth daily.   polyethylene glycol 17 g packet Commonly known as: MIRALAX / GLYCOLAX Take 17 g by mouth daily. Hold if loose stools or frequent BMs   senna-docusate 8.6-50 MG tablet Commonly known as: Senokot-S Take 1 tablet by mouth 2 (two) times daily. Hold if loose stools or frequent BMs        Contact information for after-discharge care     Destination     [facility] Preferred SNF .   Service: Skilled Nursing Contact information: [place]  [place] [phone]                    Allergies  Allergen Reactions   Kiwi Extract     Consultations: none  Procedures/Studies: CT Head Wo Contrast  Result Date: 07/23/2021 CLINICAL DATA:  Fall EXAM: CT HEAD WITHOUT CONTRAST CT CERVICAL SPINE WITHOUT CONTRAST TECHNIQUE: Multidetector CT imaging of the head and cervical spine was performed following the standard protocol without intravenous contrast. Multiplanar CT image reconstructions of the cervical spine were also generated. COMPARISON:  CT head dated March 06, 2021 FINDINGS: CT HEAD FINDINGS Brain: Chronic white matter ischemic change. No evidence of acute infarction, hemorrhage, hydrocephalus, extra-axial collection or mass lesion/mass effect. Vascular: No hyperdense vessel or unexpected calcification. Skull: Normal. Negative for fracture or focal lesion. Sinuses/Orbits: Wall thickening of the left maxillary sinus. No acute abnormality. Other: None. CT CERVICAL  SPINE FINDINGS Alignment: Normal. Skull base and vertebrae: No acute fracture. No primary bone lesion or focal pathologic process. Soft tissues and spinal canal: No prevertebral fluid or swelling. No visible canal hematoma. Disc levels:  Mild multilevel degenerative disc disease. Upper chest: Negative. Other: None. IMPRESSION: 1. No acute intracranial abnormality. 2. No CT evidence of acute cervical spine injury. Electronically Signed   By: Yetta Glassman M.D.   On: 07/23/2021 12:57   CT Cervical Spine Wo Contrast  Result Date: 07/23/2021 CLINICAL DATA:  Fall EXAM: CT HEAD WITHOUT CONTRAST CT CERVICAL SPINE WITHOUT CONTRAST TECHNIQUE: Multidetector CT imaging of the head and cervical spine was performed following the standard protocol without intravenous contrast. Multiplanar CT image reconstructions of the cervical spine were also generated. COMPARISON:  CT head dated March 06, 2021 FINDINGS: CT HEAD FINDINGS Brain: Chronic white matter ischemic change. No evidence of acute  infarction, hemorrhage, hydrocephalus, extra-axial collection or mass lesion/mass effect. Vascular: No hyperdense vessel or unexpected calcification. Skull: Normal. Negative for fracture or focal lesion. Sinuses/Orbits: Wall thickening of the left maxillary sinus. No acute abnormality. Other: None. CT CERVICAL SPINE FINDINGS Alignment: Normal. Skull base and vertebrae: No acute fracture. No primary bone lesion or focal pathologic process. Soft tissues and spinal canal: No prevertebral fluid or swelling. No visible canal hematoma. Disc levels:  Mild multilevel degenerative disc disease. Upper chest: Negative. Other: None. IMPRESSION: 1. No acute intracranial abnormality. 2. No CT evidence of acute cervical spine injury. Electronically Signed   By: Yetta Glassman M.D.   On: 07/23/2021 12:57   CT Thoracic Spine Wo Contrast  Result Date: 07/23/2021 CLINICAL DATA:  Back trauma, fall, poor historian EXAM: CT THORACIC AND LUMBAR SPINE WITHOUT CONTRAST TECHNIQUE: Multidetector CT imaging of the thoracic and lumbar spine was performed without contrast. Multiplanar CT image reconstructions were also generated. COMPARISON:  No prior CT of the thoracic and lumbar spine, correlation is made with CT chest abdomen pelvis 06/23/2021 FINDINGS: CT THORACIC SPINE FINDINGS Alignment: Dextrocurvature of the upper thoracic spine and levocurvature of the lower thoracic spine, with dextrocurvature of the lumbar spine. No listhesis. Vertebrae: Osteopenia. No acute fracture or suspicious osseous lesion. No fracture in the imaged ribs. Paraspinal and other soft tissues: Solid pulmonary nodule in the anterior right upper lobe, measuring up to 1.1 cm (series 6, image 50), which appears unchanged compared to 06/23/2021 the previously noted right middle lobe nodule is not included in the field view. No pleural effusion. No pneumothorax visualized. Large right renal lesion, incompletely imaged. Disc levels: Mild degenerative changes without  significant spinal canal stenosis. CT LUMBAR SPINE FINDINGS Segmentation: 5 lumbar type vertebrae. Alignment: Dextrocurvature of the lumbar spine. Trace anterolisthesis of L4 on L5, unchanged compared to the prior exam. Vertebrae: Osteopenia.  No acute fracture or pathologic process. Paraspinal and other soft tissues: Aortic calcifications. Large right renal mass, better evaluated on 06/23/2021 and incompletely imaged here. Degenerative changes in the bilateral sacroiliac joints. Disc levels: Disc height loss and mild disc bulges, without significant spinal canal stenosis. No high-grade neural foraminal narrowing. IMPRESSION: CT THORACIC SPINE IMPRESSION 1. No acute fracture or traumatic listhesis. 2. Redemonstrated 11 mm solid pulmonary nodule in the right upper lobe, as seen on the 06/23/2021 chest CT. A previously noted right middle lobe nodule is not included in the field of view. CT LUMBAR SPINE IMPRESSION 1. No acute fracture or traumatic listhesis. 2. Incompletely imaged right renal mass, better evaluated on 06/23/2021. Electronically Signed   By: Francetta Found.D.  On: 07/23/2021 13:03   CT Lumbar Spine Wo Contrast  Result Date: 07/23/2021 CLINICAL DATA:  Back trauma, fall, poor historian EXAM: CT THORACIC AND LUMBAR SPINE WITHOUT CONTRAST TECHNIQUE: Multidetector CT imaging of the thoracic and lumbar spine was performed without contrast. Multiplanar CT image reconstructions were also generated. COMPARISON:  No prior CT of the thoracic and lumbar spine, correlation is made with CT chest abdomen pelvis 06/23/2021 FINDINGS: CT THORACIC SPINE FINDINGS Alignment: Dextrocurvature of the upper thoracic spine and levocurvature of the lower thoracic spine, with dextrocurvature of the lumbar spine. No listhesis. Vertebrae: Osteopenia. No acute fracture or suspicious osseous lesion. No fracture in the imaged ribs. Paraspinal and other soft tissues: Solid pulmonary nodule in the anterior right upper lobe,  measuring up to 1.1 cm (series 6, image 50), which appears unchanged compared to 06/23/2021 the previously noted right middle lobe nodule is not included in the field view. No pleural effusion. No pneumothorax visualized. Large right renal lesion, incompletely imaged. Disc levels: Mild degenerative changes without significant spinal canal stenosis. CT LUMBAR SPINE FINDINGS Segmentation: 5 lumbar type vertebrae. Alignment: Dextrocurvature of the lumbar spine. Trace anterolisthesis of L4 on L5, unchanged compared to the prior exam. Vertebrae: Osteopenia.  No acute fracture or pathologic process. Paraspinal and other soft tissues: Aortic calcifications. Large right renal mass, better evaluated on 06/23/2021 and incompletely imaged here. Degenerative changes in the bilateral sacroiliac joints. Disc levels: Disc height loss and mild disc bulges, without significant spinal canal stenosis. No high-grade neural foraminal narrowing. IMPRESSION: CT THORACIC SPINE IMPRESSION 1. No acute fracture or traumatic listhesis. 2. Redemonstrated 11 mm solid pulmonary nodule in the right upper lobe, as seen on the 06/23/2021 chest CT. A previously noted right middle lobe nodule is not included in the field of view. CT LUMBAR SPINE IMPRESSION 1. No acute fracture or traumatic listhesis. 2. Incompletely imaged right renal mass, better evaluated on 06/23/2021. Electronically Signed   By: Merilyn Baba M.D.   On: 07/23/2021 13:03   DG Chest Portable 1 View  Result Date: 07/23/2021 CLINICAL DATA:  Weakness. History of metastatic renal cell carcinoma. EXAM: PORTABLE CHEST 1 VIEW COMPARISON:  03/06/2021, 06/23/2021 FINDINGS: Mild cardiomegaly, stable. Atherosclerotic calcification of the aortic knob. Nodular opacity in the periphery of the right mid lung, not appreciably changed compared to the prior CT, when accounting for differences in technique. No new focal airspace consolidation. No pleural effusion or pneumothorax. IMPRESSION: 1.  No acute cardiopulmonary abnormality. 2. Nodular opacity in the periphery of the right mid lung, not appreciably changed compared to the prior CT, when accounting for differences in technique. Electronically Signed   By: Davina Poke D.O.   On: 07/23/2021 13:15    Subjective: Patient reports she feels well overall. She had a soft BM. Her only complaint is that she does not know where her eyeglasses are currently.   Discharge Exam: Vitals:   07/30/21 0325 07/30/21 0530  BP: 124/69 122/73  Pulse: 75 77  Resp: 20 18  Temp: (!) 95.1 F (35.1 C)   SpO2: 100% 100%    General: Pt is alert, awake, not in acute distress Cardiovascular: RRR, S1/S2 +, no rubs, no gallops Respiratory: CTA bilaterally, no wheezing, no rhonchi Abdominal: Soft, NT, ND, bowel sounds + Extremities: no edema, no cyanosis  Labs: Basic Metabolic Panel: Recent Labs  Lab 07/24/21 0454 07/25/21 0554 07/26/21 0459 07/27/21 0628 07/28/21 0808  NA 135 136 139 136 135  K 3.6 3.4* 3.8 4.1 4.1  CL 105  104 104 100 101  CO2 22 25 29 27 27   GLUCOSE 254* 192* 191* 187* 205*  BUN 12 9 13 11 22   CREATININE 0.43* 0.32* 0.46 0.43* 0.45  CALCIUM 9.8 10.1 10.4* 10.5* 10.3  10.4*  MG  --   --  1.6* 1.8 1.8   CBC: Recent Labs  Lab 07/23/21 1148 07/25/21 0554 07/28/21 0808  WBC 6.7 6.8 7.9  NEUTROABS 5.0  --   --   HGB 15.4* 13.4 13.6  HCT 48.4* 41.7 41.6  MCV 83.0 81.1 82.5  PLT 173 167 218    Microbiology Recent Results (from the past 240 hour(s))  Culture, blood (routine x 2)     Status: None   Collection Time: 07/23/21 11:48 AM   Specimen: BLOOD  Result Value Ref Range Status   Specimen Description BLOOD LEFT ANTECUBITAL  Final   Special Requests   Final    BOTTLES DRAWN AEROBIC AND ANAEROBIC Blood Culture results may not be optimal due to an excessive volume of blood received in culture bottles   Culture   Final    NO GROWTH 5 DAYS Performed at [facility], [address],  [place], [place] [address]    Report Status 07/28/2021 FINAL  Final  Culture, blood (routine x 2)     Status: None   Collection Time: 07/23/21 12:13 PM   Specimen: BLOOD  Result Value Ref Range Status   Specimen Description BLOOD BLOOD RIGHT ARM  Final   Special Requests   Final    BOTTLES DRAWN AEROBIC AND ANAEROBIC Blood Culture adequate volume   Culture   Final    NO GROWTH 5 DAYS Performed at [facility], [address] [place], [place] [address]    Report Status 07/28/2021 FINAL  Final  Resp Panel by RT-PCR (Flu A&B, Covid) Nasopharyngeal Swab     Status: None   Collection Time: 07/23/21 12:13 PM   Specimen: Nasopharyngeal Swab; Nasopharyngeal(NP) swabs in vial transport medium  Result Value Ref Range Status   SARS Coronavirus 2 by RT PCR NEGATIVE NEGATIVE Final    Comment: (NOTE) SARS-CoV-2 target nucleic acids are NOT DETECTED.  The SARS-CoV-2 RNA is generally detectable in upper respiratory specimens during the acute phase of infection. The lowest concentration of SARS-CoV-2 viral copies this assay can detect is 138 copies/mL. A negative result does not preclude SARS-Cov-2 infection and should not be used as the sole basis for treatment or other patient management decisions. A negative result may occur with  improper specimen collection/handling, submission of specimen other than nasopharyngeal swab, presence of viral mutation(s) within the areas targeted by this assay, and inadequate number of viral copies(<138 copies/mL). A negative result must be combined with clinical observations, patient history, and epidemiological information. The expected result is Negative.  Fact Sheet for Patients:  EntrepreneurPulse.com.au  Fact Sheet for Healthcare Providers:  IncredibleEmployment.be  This test is no t yet approved or cleared by the [place] FDA and  has been authorized for detection and/or diagnosis of SARS-CoV-2 by FDA  under an Emergency Use Authorization (EUA). This EUA will remain  in effect (meaning this test can be used) for the duration of the COVID-19 declaration under Section 564(b)(1) of the Act, 21 U.S.C.section 360bbb-3(b)(1), unless the authorization is terminated  or revoked sooner.       Influenza A by PCR NEGATIVE NEGATIVE Final   Influenza B by PCR NEGATIVE NEGATIVE Final    Comment: (NOTE) The Xpert Xpress SARS-CoV-2/FLU/RSV plus  assay is intended as an aid in the diagnosis of influenza from Nasopharyngeal swab specimens and should not be used as a sole basis for treatment. Nasal washings and aspirates are unacceptable for Xpert Xpress SARS-CoV-2/FLU/RSV testing.  Fact Sheet for Patients: EntrepreneurPulse.com.au  Fact Sheet for Healthcare Providers: IncredibleEmployment.be  This test is not yet approved or cleared by the [place] FDA and has been authorized for detection and/or diagnosis of SARS-CoV-2 by FDA under an Emergency Use Authorization (EUA). This EUA will remain in effect (meaning this test can be used) for the duration of the COVID-19 declaration under Section 564(b)(1) of the Act, 21 U.S.C. section 360bbb-3(b)(1), unless the authorization is terminated or revoked.  Performed at [facility], [address] [place], [place] [address]   Resp Panel by RT-PCR (Flu A&B, Covid) Nasopharyngeal Swab     Status: None   Collection Time: 07/28/21 12:55 PM   Specimen: Nasopharyngeal Swab; Nasopharyngeal(NP) swabs in vial transport medium  Result Value Ref Range Status   SARS Coronavirus 2 by RT PCR NEGATIVE NEGATIVE Final    Comment: (NOTE) SARS-CoV-2 target nucleic acids are NOT DETECTED.  The SARS-CoV-2 RNA is generally detectable in upper respiratory specimens during the acute phase of infection. The lowest concentration of SARS-CoV-2 viral copies this assay can detect is 138 copies/mL. A negative result does not  preclude SARS-Cov-2 infection and should not be used as the sole basis for treatment or other patient management decisions. A negative result may occur with  improper specimen collection/handling, submission of specimen other than nasopharyngeal swab, presence of viral mutation(s) within the areas targeted by this assay, and inadequate number of viral copies(<138 copies/mL). A negative result must be combined with clinical observations, patient history, and epidemiological information. The expected result is Negative.  Fact Sheet for Patients:  EntrepreneurPulse.com.au  Fact Sheet for Healthcare Providers:  IncredibleEmployment.be  This test is no t yet approved or cleared by the [place] FDA and  has been authorized for detection and/or diagnosis of SARS-CoV-2 by FDA under an Emergency Use Authorization (EUA). This EUA will remain  in effect (meaning this test can be used) for the duration of the COVID-19 declaration under Section 564(b)(1) of the Act, 21 U.S.C.section 360bbb-3(b)(1), unless the authorization is terminated  or revoked sooner.       Influenza A by PCR NEGATIVE NEGATIVE Final   Influenza B by PCR NEGATIVE NEGATIVE Final    Comment: (NOTE) The Xpert Xpress SARS-CoV-2/FLU/RSV plus assay is intended as an aid in the diagnosis of influenza from Nasopharyngeal swab specimens and should not be used as a sole basis for treatment. Nasal washings and aspirates are unacceptable for Xpert Xpress SARS-CoV-2/FLU/RSV testing.  Fact Sheet for Patients: EntrepreneurPulse.com.au  Fact Sheet for Healthcare Providers: IncredibleEmployment.be  This test is not yet approved or cleared by the [place] FDA and has been authorized for detection and/or diagnosis of SARS-CoV-2 by FDA under an Emergency Use Authorization (EUA). This EUA will remain in effect (meaning this test can be used) for the  duration of the COVID-19 declaration under Section 564(b)(1) of the Act, 21 U.S.C. section 360bbb-3(b)(1), unless the authorization is terminated or revoked.  Performed at [facility], [address] [place], [place] [address]     Time coordinating discharge: Over 30 minutes  Richarda Osmond, MD  [facility] 07/30/2021, 6:55 AM Pager   If 7PM-7AM, please contact night-coverage [email or web address] Password [email or web address]

## 2021-07-29 NOTE — Progress Notes (Signed)
Physical Therapy Treatment Patient Details Name: Barbara Frank MRN: [id number] DOB: Jan 14, 1946 Today's Date: 07/29/2021   History of Present Illness Pt is a 75 y.o. female with medical history significant of diabetes mellitus, atrial fibrillation on Eliquis, falls, depression, renal cell carcinoma, and lung nodule. MD assessment includes: atrial fibrillation with RVR and falls.   PT Comments    Pt was pleasant and agreeable to participate in PT session. Pt unable to follow verbal and tactile cuing for ther ex in bed, so PROM exercises were performed by PT. Pt required mod assist for bed mobility for BLE and trunk control to prevent LOB. Pt required mod assist to stand from EOB but could only remain standing for ~20 seconds before needing to sit back down. Pt was unable to provide name, birthday, or location and there was very limited verbal communication during the session. Pt will benefit from PT services in a SNF setting upon discharge to safely address deficits listed in patient problem list for decreased caregiver assistance and eventual return to PLOF.   Recommendations for follow up therapy are one component of a multi-disciplinary discharge planning process, led by the attending physician.  Recommendations may be updated based on patient status, additional functional criteria and insurance authorization.  Follow Up Recommendations  Skilled nursing-short term rehab (<3 hours/day)     Assistance Recommended at Discharge Frequent or constant Supervision/Assistance  Equipment Recommendations  None recommended by PT    Recommendations for Other Services       Precautions / Restrictions Precautions Precautions: Fall Restrictions Weight Bearing Restrictions: No     Mobility  Bed Mobility Overal bed mobility: Needs Assistance Bed Mobility: Supine to Sit;Sit to Supine     Supine to sit: Mod assist Sit to supine: Mod assist   General bed mobility comments: Mod assist for trunk  control    Transfers Overall transfer level: Needs assistance Equipment used: Rolling walker (2 wheels) Transfers: Sit to/from Stand Sit to Stand: Mod assist           General transfer comment: Increased time and effort, heavy posterior bias, could not remain standing on own, requested rest break after ~20 seconds of standing    Ambulation/Gait               General Gait Details: NT   Stairs             Wheelchair Mobility    Modified Rankin (Stroke Patients Only)       Balance Overall balance assessment: Needs assistance Sitting-balance support: Feet supported;Bilateral upper extremity supported Sitting balance-Leahy Scale: Poor Sitting balance - Comments: Min assist to prevent R lateral LOB, able to self correct Postural control: Right lateral lean Standing balance support: Bilateral upper extremity supported;During functional activity Standing balance-Leahy Scale: Poor Standing balance comment: Mod assit to prevent LOB                            Cognition Arousal/Alertness: Awake/alert Behavior During Therapy: Flat affect Overall Cognitive Status: No family/caregiver present to determine baseline cognitive functioning                                 General Comments: Pt with decreased difficulty following commands and limited verbal communication.        Exercises Total Joint Exercises Ankle Circles/Pumps: PROM;Both;10 reps;Supine Hip ABduction/ADduction: PROM;Both;10 reps;Supine    General Comments  Pertinent Vitals/Pain Pain Assessment: No/denies pain    Home Living                          Prior Function            PT Goals (current goals can now be found in the care plan section) Acute Rehab PT Goals Patient Stated Goal: to go home PT Goal Formulation: With patient Time For Goal Achievement: 08/06/21 Potential to Achieve Goals: Fair Progress towards PT goals: Progressing toward  goals    Frequency    Min 2X/week      PT Plan Current plan remains appropriate    Co-evaluation              AM-PAC PT "6 Clicks" Mobility   Outcome Measure  Help needed turning from your back to your side while in a flat bed without using bedrails?: A Lot Help needed moving from lying on your back to sitting on the side of a flat bed without using bedrails?: A Lot Help needed moving to and from a bed to a chair (including a wheelchair)?: Total Help needed standing up from a chair using your arms (e.g., wheelchair or bedside chair)?: A Lot Help needed to walk in hospital room?: Total Help needed climbing 3-5 steps with a railing? : Total 6 Click Score: 9    End of Session Equipment Utilized During Treatment: Gait belt Activity Tolerance: Patient limited by lethargy Patient left: in bed;with call bell/phone within reach;with bed alarm set Nurse Communication: Mobility status PT Visit Diagnosis: Unsteadiness on feet (R26.81);Difficulty in walking, not elsewhere classified (R26.2);Muscle weakness (generalized) (M62.81)     Time: 9767-3419 PT Time Calculation (min) (ACUTE ONLY): 24 min  Charges:                        Sheldon Silvan SPT 07/29/21, 5:18 PM

## 2021-07-30 ENCOUNTER — Other Ambulatory Visit: Payer: Self-pay

## 2021-07-30 DIAGNOSIS — F320 Major depressive disorder, single episode, mild: Secondary | ICD-10-CM | POA: Diagnosis not present

## 2021-07-30 DIAGNOSIS — R531 Weakness: Secondary | ICD-10-CM | POA: Diagnosis not present

## 2021-07-30 DIAGNOSIS — K5900 Constipation, unspecified: Secondary | ICD-10-CM

## 2021-07-30 DIAGNOSIS — I4891 Unspecified atrial fibrillation: Secondary | ICD-10-CM | POA: Diagnosis not present

## 2021-07-30 LAB — MAGNESIUM: Magnesium: 1.9 mg/dL (ref 1.7–2.4)

## 2021-07-30 LAB — GLUCOSE, CAPILLARY
Glucose-Capillary: 133 mg/dL — ABNORMAL HIGH (ref 70–99)
Glucose-Capillary: 166 mg/dL — ABNORMAL HIGH (ref 70–99)
Glucose-Capillary: 377 mg/dL — ABNORMAL HIGH (ref 70–99)
Glucose-Capillary: 110 mg/dL — ABNORMAL HIGH (ref 70–99)
Glucose-Capillary: 92 mg/dL (ref 70–99)

## 2021-07-30 LAB — VITAMIN B12: Vitamin B-12: 249 pg/mL (ref 180–914)

## 2021-07-30 LAB — PHOSPHORUS: Phosphorus: 3.7 mg/dL (ref 2.5–4.6)

## 2021-07-30 MED ORDER — POLYETHYLENE GLYCOL 3350 17 G PO PACK
17.0000 g | PACK | Freq: Two times a day (BID) | ORAL | Status: DC
  Administered 2021-07-30 – 2021-07-31 (×3): 17 g via ORAL
  Filled 2021-07-30 (×3): qty 1

## 2021-07-30 MED ORDER — SORBITOL 70 % SOLN
960.0000 mL | TOPICAL_OIL | Freq: Once | ORAL | Status: DC
  Filled 2021-07-30: qty 473

## 2021-07-30 NOTE — Progress Notes (Signed)
  INTERVAL PROGRESS NOTE    Barbara Frank- 75 y.o. female  LOS: 3 __________________________________________________________________  SUBJECTIVE: Admitted 07/23/2021 Chief Complaint  Patient presents with   Atrial Fibrillation    Pt coming from [facility] retirement and had mechanical fall last night around 5 pm. Pt reports no pain and denies hitting head. Pt found to be afib rvr on medic arrival this AM. Pt's only complaint is nausea. GBL 378 for medics.    Patient feeling well today. She had a soft BM today. She has no complaints.   OBJECTIVE: Blood pressure 120/60, pulse 92, temperature 100.2 F (37.9 C), temperature source Oral, resp. rate 18, height 5\' 4"  (1.626 m), weight 70.1 kg, SpO2 96 %.  General: NAD, pleasant, able to participate in exam. Bear-hugger on. Cardiac: RRR, normal heart sounds, no murmurs. 2+ radial and PT pulses bilaterally Respiratory: CTAB, normal effort, No wheezes, rales or rhonchi Abdomen: soft, nontender, nondistended, no hepatic or splenomegaly, +BS Extremities: no edema. WWP. Skin: warm and dry, no rashes noted Neuro: alert and oriented Psych: Normal affect and mood   ASSESSMENT/PLAN:  Patient was at baseline and stable for discharge for multiple days. She was required to have BM prior to acceptance back to SNF. She had BM today and was discharged to facility. Because of the holiday schedules, patient was not discharged.  She is medically ready to discharge when able to accept to SNF. Likely 11/25.   Principal Problem:   Atrial fibrillation with RVR (HCC) Active Problems:   Depression   Renal cell carcinoma (HCC)   Type 2 diabetes mellitus without complication, with long-term current use of insulin ([place])   Fall at home, initial encounter   Lung nodule   Elevated lactic acid level   Prolonged QT interval   Generalized weakness   Constipation   Richarda Osmond, DO [facility] 07/30/2021, 6:06 PM     [email or web address] Available by Epic secure chat 7AM-7PM. If 7PM-7AM, please contact night-coverage

## 2021-07-30 NOTE — Progress Notes (Signed)
   07/30/21 0123  Assess: MEWS Score  Temp (!) 94.7 F (34.8 C)  BP 123/71  Pulse Rate 63  Resp 18  SpO2 95 %  O2 Device Room Air  Assess: MEWS Score  MEWS Temp 2  MEWS Systolic 0  MEWS Pulse 0  MEWS RR 0  MEWS LOC 0  MEWS Score 2  MEWS Score Color Yellow  Assess: if the MEWS score is Yellow or Red  Were vital signs taken at a resting state? Yes  Focused Assessment Change from prior assessment (see assessment flowsheet)  Does the patient meet 2 or more of the SIRS criteria? No  MEWS guidelines implemented *See Row Information* Yes  Treat  MEWS Interventions Escalated (See documentation below)  Pain Scale 0-10  Pain Score 0  Breathing 0  Negative Vocalization 0  Facial Expression 0  Body Language 0  Consolability 0  PAINAD Score 0  Complains of Other (Comment) (Cold)  Interventions Other (comment) (Warm blankets and bear hug, EKG, and some labs)  Neuro symptoms relieved by Rest  Take Vital Signs  Increase Vital Sign Frequency  Yellow: Q 2hr X 2 then Q 4hr X 2, if remains yellow, continue Q 4hrs  Escalate  MEWS: Escalate Yellow: discuss with charge nurse/RN and consider discussing with provider and RRT  Notify: Charge Nurse/RN  Name of Charge Nurse/RN Notified  Britt Bolognese)  Date Charge Nurse/RN Notified 07/30/21  Time Charge Nurse/RN Notified 3545  Notify: Provider  Provider Name/Title  Sharion Settler)  Date Provider Notified 07/30/21  Time Provider Notified 0116  Notification Type Page  Notification Reason Change in status  Provider response See new orders  Date of Provider Response 07/30/21  Time of Provider Response 0119  Document  Patient Outcome Stabilized after interventions  Assess: SIRS CRITERIA  SIRS Temperature  1  SIRS Pulse 0  SIRS Respirations  0  SIRS WBC 0  SIRS Score Sum  1

## 2021-07-30 NOTE — Plan of Care (Signed)
  Problem: Education: Goal: Knowledge of General Education information will improve Description: Including pain rating scale, medication(s)/side effects and non-pharmacologic comfort measures 07/30/2021 1652 by Cristela Blue, RN Outcome: Adequate for Discharge 07/30/2021 1651 by Cristela Blue, RN Outcome: Adequate for Discharge   Problem: Education: Goal: Knowledge of disease or condition will improve 07/30/2021 1652 by Cristela Blue, RN Outcome: Adequate for Discharge 07/30/2021 1651 by Cristela Blue, RN Outcome: Adequate for Discharge Goal: Understanding of medication regimen will improve 07/30/2021 1652 by Cristela Blue, RN Outcome: Adequate for Discharge 07/30/2021 1651 by Cristela Blue, RN Outcome: Adequate for Discharge Goal: Individualized Educational Video(s) 07/30/2021 1652 by Cristela Blue, RN Outcome: Adequate for Discharge 07/30/2021 1651 by Cristela Blue, RN Outcome: Adequate for Discharge   Problem: Activity: Goal: Ability to tolerate increased activity will improve 07/30/2021 1652 by Cristela Blue, RN Outcome: Adequate for Discharge 07/30/2021 1651 by Cristela Blue, RN Outcome: Adequate for Discharge   Problem: Cardiac: Goal: Ability to achieve and maintain adequate cardiopulmonary perfusion will improve 07/30/2021 1652 by Cristela Blue, RN Outcome: Adequate for Discharge 07/30/2021 1651 by Cristela Blue, RN Outcome: Adequate for Discharge   Problem: Health Behavior/Discharge Planning: Goal: Ability to safely manage health-related needs after discharge will improve 07/30/2021 1652 by Cristela Blue, RN Outcome: Adequate for Discharge 07/30/2021 1651 by Cristela Blue, RN Outcome: Adequate for Discharge

## 2021-07-31 DIAGNOSIS — F320 Major depressive disorder, single episode, mild: Secondary | ICD-10-CM | POA: Diagnosis not present

## 2021-07-31 DIAGNOSIS — I4891 Unspecified atrial fibrillation: Secondary | ICD-10-CM | POA: Diagnosis not present

## 2021-07-31 DIAGNOSIS — K5900 Constipation, unspecified: Secondary | ICD-10-CM | POA: Diagnosis not present

## 2021-07-31 DIAGNOSIS — R531 Weakness: Secondary | ICD-10-CM | POA: Diagnosis not present

## 2021-07-31 LAB — CBC
HCT: 38.8 % (ref 36.0–46.0)
Hemoglobin: 12.6 g/dL (ref 12.0–15.0)
MCH: 27 pg (ref 26.0–34.0)
MCHC: 32.5 g/dL (ref 30.0–36.0)
MCV: 83.1 fL (ref 80.0–100.0)
Platelets: 290 10*3/uL (ref 150–400)
RBC: 4.67 MIL/uL (ref 3.87–5.11)
RDW: 14.6 % (ref 11.5–15.5)
WBC: 7 10*3/uL (ref 4.0–10.5)
nRBC: 0 % (ref 0.0–0.2)

## 2021-07-31 LAB — GLUCOSE, CAPILLARY
Glucose-Capillary: 154 mg/dL — ABNORMAL HIGH (ref 70–99)
Glucose-Capillary: 97 mg/dL (ref 70–99)
Glucose-Capillary: 255 mg/dL — ABNORMAL HIGH (ref 70–99)

## 2021-07-31 NOTE — Discharge Summary (Signed)
Physician Discharge Summary  Barbara Frank BPZ:[id number] DOB: September 19, 1945 DOA: 07/23/2021  PCP: Sheral Apley  Admit date: 07/23/2021 Discharge date: 07/31/2021  Admitted From: retirement home Disposition: Skilled nursing facility  Recommendations for Outpatient Follow-up:  Follow up with PCP within 1-2 weeks to monitor for constipation resolution Follow up with cardiology in 1-2 weeks to monitor Afib treatments  Discharge Condition:improved, stable CODE STATUS:  Code Status: Full Code  Regular healthy diet  Brief/Interim Summary: -Pt presented to the hospital with decreased mentation and dizziness and was found to have Afib with RVR. It appears that this was brought on by non-adherence to her home medications. She responded well to resuming her home medications of cardizem and carvedilol was added. Her symptoms resolved and her heart rate was well controlled for several days prior to discharge. She was asymptomatic and mental status had returned back to her baseline.  -Patient had no recorded BM since admission so had delayed discharge until she had a BM. She continued to deny painful abdomen or tenesmus. She was treated with bowel softeners and fiber supplements as well as a suppository and enema over the course of two days. She had a soft BM 11/24.  -She also had intertrigo of panus improved with topical miconazole.   Qtc 497  Discharge Diagnoses:  Principal Problem:   Atrial fibrillation with RVR ([place]) Active Problems:   Depression   Renal cell carcinoma (HCC)   Type 2 diabetes mellitus without complication, with long-term current use of insulin ([place])   Fall at home, initial encounter   Lung nodule   Elevated lactic acid level   Prolonged QT interval   Generalized weakness   Constipation   Discharge Instructions     Call MD for:   Complete by: As directed    Heart racing / palpitations, chest pain, shortness of breath. Uncontrolled blood sugars - high or  low Uncontrolled blood pressure - if above 150/90 or below 100/60   Call MD for:  extreme fatigue   Complete by: As directed    Call MD for:  persistant dizziness or light-headedness   Complete by: As directed    Call MD for:  persistant nausea and vomiting   Complete by: As directed    Call MD for:  severe uncontrolled pain   Complete by: As directed    Call MD for:  temperature >100.4   Complete by: As directed    Diet - low sodium heart healthy   Complete by: As directed    Discharge instructions   Complete by: As directed    For your A-fib, your heart rate was uncontrolled when you were admitted to the hospital. During your stay, your blood pressure was also elevated, so we changed your medications as follows:  --increased dose of diltiazem (Cardizem) --changed your beta blocker from metoprolol to carvedilol (Coreg) to help more with BP control  Please monitor BP and HR at rehab closely.  Follow up with facility doctor and/or your Primary Care provider within 1 week of discharge.  RENAL CELL CARCINOMA -- Dr. Tasia Catchings had been holding your medication (axitinib) because of persitently high blood sugars.  Please follow up with her in clinic as previously scheduled or after rehab stay.  BLOOD SUGARS -- please watch closely and, if uncontrolled, adjust insulin dosing.   Increase activity slowly   Complete by: As directed       Allergies as of 07/31/2021       Reactions   Kiwi Extract  Medication List     STOP taking these medications    axitinib 5 MG tablet Commonly known as: INLYTA   diclofenac Sodium 1 % Gel Commonly known as: VOLTAREN   lactulose 10 GM/15ML solution Commonly known as: CHRONULAC   metoprolol succinate 50 MG 24 hr tablet Commonly known as: TOPROL-XL   nystatin powder Commonly known as: MYCOSTATIN/NYSTOP       TAKE these medications    acetaminophen 325 MG tablet Commonly known as: TYLENOL Take 2 tablets (650 mg total) by mouth every  6 (six) hours as needed for mild pain, fever or headache.   apixaban 5 MG Tabs tablet Commonly known as: ELIQUIS Take 5 mg by mouth 2 (two) times daily.   carvedilol 6.25 MG tablet Commonly known as: COREG Take 1 tablet (6.25 mg total) by mouth 2 (two) times daily with a meal.   cetirizine 10 MG tablet Commonly known as: ZYRTEC Take 5 mg by mouth daily.   diltiazem 240 MG 24 hr capsule Commonly known as: CARDIZEM CD Take 1 capsule (240 mg total) by mouth every evening. What changed:  medication strength how much to take   escitalopram 10 MG tablet Commonly known as: LEXAPRO Take 10 mg by mouth daily.   feeding supplement (GLUCERNA SHAKE) Liqd Take 237 mLs by mouth 3 (three) times daily between meals.   insulin aspart 100 UNIT/ML injection Commonly known as: novoLOG Inject 4 Units into the skin 3 (three) times daily with meals.   insulin glargine 100 UNIT/ML injection Commonly known as: LANTUS Inject 0.27 mLs (27 Units total) into the skin 2 (two) times daily. What changed: how much to take   metFORMIN 500 MG 24 hr tablet Commonly known as: GLUCOPHAGE-XR Take 1 tablet (500 mg total) by mouth 2 (two) times daily. What changed:  how much to take how to take this when to take this   miconazole nitrate Powd Commonly known as: MICATIN Apply 1 application topically 2 (two) times daily for 7 days.   multivitamin with minerals Tabs tablet Take 1 tablet by mouth daily.   polyethylene glycol 17 g packet Commonly known as: MIRALAX / GLYCOLAX Take 17 g by mouth daily. Hold if loose stools or frequent BMs        Contact information for after-discharge care     Destination     [facility] Preferred SNF .   Service: Skilled Nursing Contact information: [place] [phone]                    Allergies  Allergen Reactions   Kiwi Extract     Consultations: none  Procedures/Studies: CT  Head Wo Contrast  Result Date: 07/23/2021 CLINICAL DATA:  Fall EXAM: CT HEAD WITHOUT CONTRAST CT CERVICAL SPINE WITHOUT CONTRAST TECHNIQUE: Multidetector CT imaging of the head and cervical spine was performed following the standard protocol without intravenous contrast. Multiplanar CT image reconstructions of the cervical spine were also generated. COMPARISON:  CT head dated March 06, 2021 FINDINGS: CT HEAD FINDINGS Brain: Chronic white matter ischemic change. No evidence of acute infarction, hemorrhage, hydrocephalus, extra-axial collection or mass lesion/mass effect. Vascular: No hyperdense vessel or unexpected calcification. Skull: Normal. Negative for fracture or focal lesion. Sinuses/Orbits: Wall thickening of the left maxillary sinus. No acute abnormality. Other: None. CT CERVICAL SPINE FINDINGS Alignment: Normal. Skull base and vertebrae: No acute fracture. No primary bone lesion or focal pathologic process. Soft tissues and spinal canal: No prevertebral  fluid or swelling. No visible canal hematoma. Disc levels:  Mild multilevel degenerative disc disease. Upper chest: Negative. Other: None. IMPRESSION: 1. No acute intracranial abnormality. 2. No CT evidence of acute cervical spine injury. Electronically Signed   By: Yetta Glassman M.D.   On: 07/23/2021 12:57   CT Cervical Spine Wo Contrast  Result Date: 07/23/2021 CLINICAL DATA:  Fall EXAM: CT HEAD WITHOUT CONTRAST CT CERVICAL SPINE WITHOUT CONTRAST TECHNIQUE: Multidetector CT imaging of the head and cervical spine was performed following the standard protocol without intravenous contrast. Multiplanar CT image reconstructions of the cervical spine were also generated. COMPARISON:  CT head dated March 06, 2021 FINDINGS: CT HEAD FINDINGS Brain: Chronic white matter ischemic change. No evidence of acute infarction, hemorrhage, hydrocephalus, extra-axial collection or mass lesion/mass effect. Vascular: No hyperdense vessel or unexpected calcification.  Skull: Normal. Negative for fracture or focal lesion. Sinuses/Orbits: Wall thickening of the left maxillary sinus. No acute abnormality. Other: None. CT CERVICAL SPINE FINDINGS Alignment: Normal. Skull base and vertebrae: No acute fracture. No primary bone lesion or focal pathologic process. Soft tissues and spinal canal: No prevertebral fluid or swelling. No visible canal hematoma. Disc levels:  Mild multilevel degenerative disc disease. Upper chest: Negative. Other: None. IMPRESSION: 1. No acute intracranial abnormality. 2. No CT evidence of acute cervical spine injury. Electronically Signed   By: Yetta Glassman M.D.   On: 07/23/2021 12:57   CT Thoracic Spine Wo Contrast  Result Date: 07/23/2021 CLINICAL DATA:  Back trauma, fall, poor historian EXAM: CT THORACIC AND LUMBAR SPINE WITHOUT CONTRAST TECHNIQUE: Multidetector CT imaging of the thoracic and lumbar spine was performed without contrast. Multiplanar CT image reconstructions were also generated. COMPARISON:  No prior CT of the thoracic and lumbar spine, correlation is made with CT chest abdomen pelvis 06/23/2021 FINDINGS: CT THORACIC SPINE FINDINGS Alignment: Dextrocurvature of the upper thoracic spine and levocurvature of the lower thoracic spine, with dextrocurvature of the lumbar spine. No listhesis. Vertebrae: Osteopenia. No acute fracture or suspicious osseous lesion. No fracture in the imaged ribs. Paraspinal and other soft tissues: Solid pulmonary nodule in the anterior right upper lobe, measuring up to 1.1 cm (series 6, image 50), which appears unchanged compared to 06/23/2021 the previously noted right middle lobe nodule is not included in the field view. No pleural effusion. No pneumothorax visualized. Large right renal lesion, incompletely imaged. Disc levels: Mild degenerative changes without significant spinal canal stenosis. CT LUMBAR SPINE FINDINGS Segmentation: 5 lumbar type vertebrae. Alignment: Dextrocurvature of the lumbar spine.  Trace anterolisthesis of L4 on L5, unchanged compared to the prior exam. Vertebrae: Osteopenia.  No acute fracture or pathologic process. Paraspinal and other soft tissues: Aortic calcifications. Large right renal mass, better evaluated on 06/23/2021 and incompletely imaged here. Degenerative changes in the bilateral sacroiliac joints. Disc levels: Disc height loss and mild disc bulges, without significant spinal canal stenosis. No high-grade neural foraminal narrowing. IMPRESSION: CT THORACIC SPINE IMPRESSION 1. No acute fracture or traumatic listhesis. 2. Redemonstrated 11 mm solid pulmonary nodule in the right upper lobe, as seen on the 06/23/2021 chest CT. A previously noted right middle lobe nodule is not included in the field of view. CT LUMBAR SPINE IMPRESSION 1. No acute fracture or traumatic listhesis. 2. Incompletely imaged right renal mass, better evaluated on 06/23/2021. Electronically Signed   By: Merilyn Baba M.D.   On: 07/23/2021 13:03   CT Lumbar Spine Wo Contrast  Result Date: 07/23/2021 CLINICAL DATA:  Back trauma, fall, poor historian EXAM: CT  THORACIC AND LUMBAR SPINE WITHOUT CONTRAST TECHNIQUE: Multidetector CT imaging of the thoracic and lumbar spine was performed without contrast. Multiplanar CT image reconstructions were also generated. COMPARISON:  No prior CT of the thoracic and lumbar spine, correlation is made with CT chest abdomen pelvis 06/23/2021 FINDINGS: CT THORACIC SPINE FINDINGS Alignment: Dextrocurvature of the upper thoracic spine and levocurvature of the lower thoracic spine, with dextrocurvature of the lumbar spine. No listhesis. Vertebrae: Osteopenia. No acute fracture or suspicious osseous lesion. No fracture in the imaged ribs. Paraspinal and other soft tissues: Solid pulmonary nodule in the anterior right upper lobe, measuring up to 1.1 cm (series 6, image 50), which appears unchanged compared to 06/23/2021 the previously noted right middle lobe nodule is not included  in the field view. No pleural effusion. No pneumothorax visualized. Large right renal lesion, incompletely imaged. Disc levels: Mild degenerative changes without significant spinal canal stenosis. CT LUMBAR SPINE FINDINGS Segmentation: 5 lumbar type vertebrae. Alignment: Dextrocurvature of the lumbar spine. Trace anterolisthesis of L4 on L5, unchanged compared to the prior exam. Vertebrae: Osteopenia.  No acute fracture or pathologic process. Paraspinal and other soft tissues: Aortic calcifications. Large right renal mass, better evaluated on 06/23/2021 and incompletely imaged here. Degenerative changes in the bilateral sacroiliac joints. Disc levels: Disc height loss and mild disc bulges, without significant spinal canal stenosis. No high-grade neural foraminal narrowing. IMPRESSION: CT THORACIC SPINE IMPRESSION 1. No acute fracture or traumatic listhesis. 2. Redemonstrated 11 mm solid pulmonary nodule in the right upper lobe, as seen on the 06/23/2021 chest CT. A previously noted right middle lobe nodule is not included in the field of view. CT LUMBAR SPINE IMPRESSION 1. No acute fracture or traumatic listhesis. 2. Incompletely imaged right renal mass, better evaluated on 06/23/2021. Electronically Signed   By: Merilyn Baba M.D.   On: 07/23/2021 13:03   DG Chest Portable 1 View  Result Date: 07/23/2021 CLINICAL DATA:  Weakness. History of metastatic renal cell carcinoma. EXAM: PORTABLE CHEST 1 VIEW COMPARISON:  03/06/2021, 06/23/2021 FINDINGS: Mild cardiomegaly, stable. Atherosclerotic calcification of the aortic knob. Nodular opacity in the periphery of the right mid lung, not appreciably changed compared to the prior CT, when accounting for differences in technique. No new focal airspace consolidation. No pleural effusion or pneumothorax. IMPRESSION: 1. No acute cardiopulmonary abnormality. 2. Nodular opacity in the periphery of the right mid lung, not appreciably changed compared to the prior CT, when  accounting for differences in technique. Electronically Signed   By: Davina Poke D.O.   On: 07/23/2021 13:15    Subjective: Patient reports she feels well overall. She is concerned about her walker not being in room.   Discharge Exam: Vitals:   07/30/21 2307 07/31/21 0444  BP: 110/64 125/65  Pulse: 84 81  Resp: 18 18  Temp: 99.4 F (37.4 C) 98.8 F (37.1 C)  SpO2: 94% 98%    General: Pt is alert, awake, not in acute distress Cardiovascular: RRR, S1/S2 +, no rubs, no gallops Respiratory: CTA bilaterally, no wheezing, no rhonchi Abdominal: Soft, NT, ND, bowel sounds + Extremities: no edema, no cyanosis  Labs: Basic Metabolic Panel: Recent Labs  Lab 07/25/21 0554 07/26/21 0459 07/27/21 0628 07/28/21 0808 07/30/21 0606  NA 136 139 136 135  --   K 3.4* 3.8 4.1 4.1  --   CL 104 104 100 101  --   CO2 25 29 27 27   --   GLUCOSE 192* 191* 187* 205*  --   BUN 9  13 11 22   --   CREATININE 0.32* 0.46 0.43* 0.45  --   CALCIUM 10.1 10.4* 10.5* 10.3  10.4*  --   MG  --  1.6* 1.8 1.8 1.9  PHOS  --   --   --   --  3.7    CBC: Recent Labs  Lab 07/25/21 0554 07/28/21 0808 07/31/21 0414  WBC 6.8 7.9 7.0  HGB 13.4 13.6 12.6  HCT 41.7 41.6 38.8  MCV 81.1 82.5 83.1  PLT 167 218 290     Microbiology Recent Results (from the past 240 hour(s))  Culture, blood (routine x 2)     Status: None   Collection Time: 07/23/21 11:48 AM   Specimen: BLOOD  Result Value Ref Range Status   Specimen Description BLOOD LEFT ANTECUBITAL  Final   Special Requests   Final    BOTTLES DRAWN AEROBIC AND ANAEROBIC Blood Culture results may not be optimal due to an excessive volume of blood received in culture bottles   Culture   Final    NO GROWTH 5 DAYS Performed at [facility], [address] [place], [place] [address]    Report Status 07/28/2021 FINAL  Final  Culture, blood (routine x 2)     Status: None   Collection Time: 07/23/21 12:13 PM   Specimen: BLOOD  Result Value  Ref Range Status   Specimen Description BLOOD BLOOD RIGHT ARM  Final   Special Requests   Final    BOTTLES DRAWN AEROBIC AND ANAEROBIC Blood Culture adequate volume   Culture   Final    NO GROWTH 5 DAYS Performed at [facility], [address] [place], [place] [address]    Report Status 07/28/2021 FINAL  Final  Resp Panel by RT-PCR (Flu A&B, Covid) Nasopharyngeal Swab     Status: None   Collection Time: 07/23/21 12:13 PM   Specimen: Nasopharyngeal Swab; Nasopharyngeal(NP) swabs in vial transport medium  Result Value Ref Range Status   SARS Coronavirus 2 by RT PCR NEGATIVE NEGATIVE Final    Comment: (NOTE) SARS-CoV-2 target nucleic acids are NOT DETECTED.  The SARS-CoV-2 RNA is generally detectable in upper respiratory specimens during the acute phase of infection. The lowest concentration of SARS-CoV-2 viral copies this assay can detect is 138 copies/mL. A negative result does not preclude SARS-Cov-2 infection and should not be used as the sole basis for treatment or other patient management decisions. A negative result may occur with  improper specimen collection/handling, submission of specimen other than nasopharyngeal swab, presence of viral mutation(s) within the areas targeted by this assay, and inadequate number of viral copies(<138 copies/mL). A negative result must be combined with clinical observations, patient history, and epidemiological information. The expected result is Negative.  Fact Sheet for Patients:  EntrepreneurPulse.com.au  Fact Sheet for Healthcare Providers:  IncredibleEmployment.be  This test is no t yet approved or cleared by the [place] FDA and  has been authorized for detection and/or diagnosis of SARS-CoV-2 by FDA under an Emergency Use Authorization (EUA). This EUA will remain  in effect (meaning this test can be used) for the duration of the COVID-19 declaration under Section 564(b)(1) of the  Act, 21 U.S.C.section 360bbb-3(b)(1), unless the authorization is terminated  or revoked sooner.       Influenza A by PCR NEGATIVE NEGATIVE Final   Influenza B by PCR NEGATIVE NEGATIVE Final    Comment: (NOTE) The Xpert Xpress SARS-CoV-2/FLU/RSV plus assay is intended as an aid in the diagnosis  of influenza from Nasopharyngeal swab specimens and should not be used as a sole basis for treatment. Nasal washings and aspirates are unacceptable for Xpert Xpress SARS-CoV-2/FLU/RSV testing.  Fact Sheet for Patients: EntrepreneurPulse.com.au  Fact Sheet for Healthcare Providers: IncredibleEmployment.be  This test is not yet approved or cleared by the [place] FDA and has been authorized for detection and/or diagnosis of SARS-CoV-2 by FDA under an Emergency Use Authorization (EUA). This EUA will remain in effect (meaning this test can be used) for the duration of the COVID-19 declaration under Section 564(b)(1) of the Act, 21 U.S.C. section 360bbb-3(b)(1), unless the authorization is terminated or revoked.  Performed at [facility], [address] [place], [place] [address]   Resp Panel by RT-PCR (Flu A&B, Covid) Nasopharyngeal Swab     Status: None   Collection Time: 07/28/21 12:55 PM   Specimen: Nasopharyngeal Swab; Nasopharyngeal(NP) swabs in vial transport medium  Result Value Ref Range Status   SARS Coronavirus 2 by RT PCR NEGATIVE NEGATIVE Final    Comment: (NOTE) SARS-CoV-2 target nucleic acids are NOT DETECTED.  The SARS-CoV-2 RNA is generally detectable in upper respiratory specimens during the acute phase of infection. The lowest concentration of SARS-CoV-2 viral copies this assay can detect is 138 copies/mL. A negative result does not preclude SARS-Cov-2 infection and should not be used as the sole basis for treatment or other patient management decisions. A negative result may occur with  improper specimen  collection/handling, submission of specimen other than nasopharyngeal swab, presence of viral mutation(s) within the areas targeted by this assay, and inadequate number of viral copies(<138 copies/mL). A negative result must be combined with clinical observations, patient history, and epidemiological information. The expected result is Negative.  Fact Sheet for Patients:  EntrepreneurPulse.com.au  Fact Sheet for Healthcare Providers:  IncredibleEmployment.be  This test is no t yet approved or cleared by the [place] FDA and  has been authorized for detection and/or diagnosis of SARS-CoV-2 by FDA under an Emergency Use Authorization (EUA). This EUA will remain  in effect (meaning this test can be used) for the duration of the COVID-19 declaration under Section 564(b)(1) of the Act, 21 U.S.C.section 360bbb-3(b)(1), unless the authorization is terminated  or revoked sooner.       Influenza A by PCR NEGATIVE NEGATIVE Final   Influenza B by PCR NEGATIVE NEGATIVE Final    Comment: (NOTE) The Xpert Xpress SARS-CoV-2/FLU/RSV plus assay is intended as an aid in the diagnosis of influenza from Nasopharyngeal swab specimens and should not be used as a sole basis for treatment. Nasal washings and aspirates are unacceptable for Xpert Xpress SARS-CoV-2/FLU/RSV testing.  Fact Sheet for Patients: EntrepreneurPulse.com.au  Fact Sheet for Healthcare Providers: IncredibleEmployment.be  This test is not yet approved or cleared by the [place] FDA and has been authorized for detection and/or diagnosis of SARS-CoV-2 by FDA under an Emergency Use Authorization (EUA). This EUA will remain in effect (meaning this test can be used) for the duration of the COVID-19 declaration under Section 564(b)(1) of the Act, 21 U.S.C. section 360bbb-3(b)(1), unless the authorization is terminated or revoked.  Performed at [facility], [address] [place], [place] [address]     Time coordinating discharge: Over 30 minutes  Richarda Osmond, MD  [facility] 07/31/2021, 7:03 AM Pager   If 7PM-7AM, please contact night-coverage [email or web address] Password [email or web address]

## 2021-07-31 NOTE — Care Management Important Message (Signed)
Important Message  Patient Details  Name: Barbara Frank MRN: [id number] Date of Birth: 1946-03-18   Medicare Important Message Given:  Other (see comment)  Attempted to obtain signature for Medicare IM, though unable to sign due to medical condition.  A blank copy was left with patient's belongings.     [name] 07/31/2021, 2:05 PM

## 2021-07-31 NOTE — TOC Transition Note (Addendum)
Transition of Care [facility]) - CM/SW Discharge Note   Patient Details  Name: Barbara Frank MRN: [id number] Date of Birth: 1946-04-19  Transition of Care [facility] CM/SW Contact:  Magnus Ivan, LCSW Phone Number: 07/31/2021, 9:16 AM   Clinical Narrative:   Discharge to [facility] today. Room 801. Confirmed with Admissions Worker Tammy. Per Tammy, no new COVID test needed since patient had one 11/22.  Updated MD, RN.  Updated Regina with [facility] per her request. Left VM for niece [name].  Asked RN to call report. EMS paperwork completed. Will call EMS when RN is ready.    10:45- [facility] called for nonemergent transport to [facility]. Patient is 6th on the list. RN notified.   Final next level of care: Skilled Nursing Facility Barriers to Discharge: Barriers Resolved   Patient Goals and CMS Choice   CMS Medicare.gov Compare Post Acute Care list provided to:: Patient Represenative (must comment) Choice offered to / list presented to : [facility] POA / [place]  Discharge Placement              Patient chooses bed at: [facility] Patient to be transferred to facility by: [facility] Name of family member notified: [name] - niece - left VM Patient and family notified of of transfer: 07/31/21  Discharge Plan and Services                                     Social Determinants of Health (SDOH) Interventions     Readmission Risk Interventions No flowsheet data found.

## 2021-08-03 ENCOUNTER — Other Ambulatory Visit (HOSPITAL_COMMUNITY): Payer: Self-pay

## 2021-09-08 ENCOUNTER — Telehealth: Payer: Self-pay | Admitting: *Deleted

## 2021-09-08 NOTE — Telephone Encounter (Signed)
Please schedule lab/MD- next available appt.

## 2021-09-08 NOTE — Telephone Encounter (Signed)
Pt made aware of appt.

## 2021-09-08 NOTE — Telephone Encounter (Signed)
Patient called asking about an appointment and the fact that she has been in the hospital since last seen. (Her hospitalization had to do with her heart and not taking her home medications.) Her last ofice visit with [name] was in Oct and follow up was tbd, follow-up and Disposition History  06/29/2021 1453 - [place], CMA  Check-out note: no tx today  1L IVF  MD (Josh) tbd....ahs   Please advise.

## 2021-09-09 ENCOUNTER — Observation Stay: Payer: Medicare PPO

## 2021-09-09 ENCOUNTER — Emergency Department: Payer: Medicare PPO

## 2021-09-09 ENCOUNTER — Other Ambulatory Visit: Payer: Self-pay

## 2021-09-09 ENCOUNTER — Observation Stay
Admission: EM | Admit: 2021-09-09 | Discharge: 2021-09-14 | Disposition: A | Discharge status: Home / Self Care | Payer: Medicare PPO | Attending: Student | Admitting: Student

## 2021-09-09 DIAGNOSIS — I517 Cardiomegaly: Secondary | ICD-10-CM | POA: Diagnosis not present

## 2021-09-09 DIAGNOSIS — E1165 Type 2 diabetes mellitus with hyperglycemia: Secondary | ICD-10-CM | POA: Diagnosis not present

## 2021-09-09 DIAGNOSIS — M6281 Muscle weakness (generalized): Secondary | ICD-10-CM | POA: Diagnosis not present

## 2021-09-09 DIAGNOSIS — Y9259 Other trade areas as the place of occurrence of the external cause: Secondary | ICD-10-CM | POA: Diagnosis not present

## 2021-09-09 DIAGNOSIS — E1110 Type 2 diabetes mellitus with ketoacidosis without coma: Secondary | ICD-10-CM | POA: Diagnosis not present

## 2021-09-09 DIAGNOSIS — S0990XA Unspecified injury of head, initial encounter: Secondary | ICD-10-CM | POA: Diagnosis not present

## 2021-09-09 DIAGNOSIS — Z9181 History of falling: Secondary | ICD-10-CM | POA: Insufficient documentation

## 2021-09-09 DIAGNOSIS — E860 Dehydration: Secondary | ICD-10-CM

## 2021-09-09 DIAGNOSIS — Z85528 Personal history of other malignant neoplasm of kidney: Secondary | ICD-10-CM | POA: Diagnosis not present

## 2021-09-09 DIAGNOSIS — Z794 Long term (current) use of insulin: Secondary | ICD-10-CM | POA: Diagnosis not present

## 2021-09-09 DIAGNOSIS — W01198A Fall on same level from slipping, tripping and stumbling with subsequent striking against other object, initial encounter: Secondary | ICD-10-CM | POA: Insufficient documentation

## 2021-09-09 DIAGNOSIS — Z79899 Other long term (current) drug therapy: Secondary | ICD-10-CM | POA: Diagnosis not present

## 2021-09-09 DIAGNOSIS — Y9389 Activity, other specified: Secondary | ICD-10-CM | POA: Diagnosis not present

## 2021-09-09 DIAGNOSIS — E041 Nontoxic single thyroid nodule: Secondary | ICD-10-CM | POA: Insufficient documentation

## 2021-09-09 DIAGNOSIS — Z7984 Long term (current) use of oral hypoglycemic drugs: Secondary | ICD-10-CM | POA: Diagnosis not present

## 2021-09-09 DIAGNOSIS — Z20822 Contact with and (suspected) exposure to covid-19: Secondary | ICD-10-CM | POA: Diagnosis not present

## 2021-09-09 DIAGNOSIS — R739 Hyperglycemia, unspecified: Secondary | ICD-10-CM

## 2021-09-09 DIAGNOSIS — Z7901 Long term (current) use of anticoagulants: Secondary | ICD-10-CM | POA: Diagnosis not present

## 2021-09-09 DIAGNOSIS — R262 Difficulty in walking, not elsewhere classified: Secondary | ICD-10-CM | POA: Insufficient documentation

## 2021-09-09 DIAGNOSIS — I4891 Unspecified atrial fibrillation: Secondary | ICD-10-CM | POA: Diagnosis not present

## 2021-09-09 DIAGNOSIS — I4811 Longstanding persistent atrial fibrillation: Secondary | ICD-10-CM | POA: Diagnosis not present

## 2021-09-09 DIAGNOSIS — W19XXXA Unspecified fall, initial encounter: Secondary | ICD-10-CM

## 2021-09-09 LAB — BASIC METABOLIC PANEL
Anion gap: 16 — ABNORMAL HIGH (ref 5–15)
Anion gap: 6 (ref 5–15)
BUN: 15 mg/dL (ref 8–23)
BUN: 13 mg/dL (ref 8–23)
CO2: 19 mmol/L — ABNORMAL LOW (ref 22–32)
CO2: 26 mmol/L (ref 22–32)
Calcium: 10.5 mg/dL — ABNORMAL HIGH (ref 8.9–10.3)
Calcium: 10 mg/dL (ref 8.9–10.3)
Chloride: 96 mmol/L — ABNORMAL LOW (ref 98–111)
Chloride: 105 mmol/L (ref 98–111)
Creatinine, Ser: 0.66 mg/dL (ref 0.44–1.00)
Creatinine, Ser: 0.59 mg/dL (ref 0.44–1.00)
GFR, Estimated: >60 mL/min (ref >60)
GFR, Estimated: >60 mL/min (ref >60)
Glucose, Bld: 529 mg/dL (ref 70–99)
Glucose, Bld: 248 mg/dL — ABNORMAL HIGH (ref 70–99)
Potassium: 4.5 mmol/L (ref 3.5–5.1)
Potassium: 3.6 mmol/L (ref 3.5–5.1)
Sodium: 131 mmol/L — ABNORMAL LOW (ref 135–145)
Sodium: 137 mmol/L (ref 135–145)

## 2021-09-09 LAB — BETA-HYDROXYBUTYRIC ACID: Beta-Hydroxybutyric Acid: 2.92 mmol/L — ABNORMAL HIGH (ref 0.05–0.27)

## 2021-09-09 LAB — CBC WITH DIFFERENTIAL/PLATELET
Abs Immature Granulocytes: 0.07 10*3/uL (ref 0.00–0.07)
Basophils Absolute: 0.1 10*3/uL (ref 0.0–0.1)
Basophils Relative: 1 %
Eosinophils Absolute: 0.1 10*3/uL (ref 0.0–0.5)
Eosinophils Relative: 1 %
HCT: 42.7 % (ref 36.0–46.0)
Hemoglobin: 13.3 g/dL (ref 12.0–15.0)
Immature Granulocytes: 1 %
Lymphocytes Relative: 18 %
Lymphs Abs: 1.4 10*3/uL (ref 0.7–4.0)
MCH: 27.1 pg (ref 26.0–34.0)
MCHC: 31.1 g/dL (ref 30.0–36.0)
MCV: 87.1 fL (ref 80.0–100.0)
Monocytes Absolute: 0.6 10*3/uL (ref 0.1–1.0)
Monocytes Relative: 8 %
Neutro Abs: 5.5 10*3/uL (ref 1.7–7.7)
Neutrophils Relative %: 71 %
Platelets: 312 10*3/uL (ref 150–400)
RBC: 4.9 MIL/uL (ref 3.87–5.11)
RDW: 14.8 % (ref 11.5–15.5)
WBC: 7.6 10*3/uL (ref 4.0–10.5)
nRBC: 0 % (ref 0.0–0.2)

## 2021-09-09 LAB — TROPONIN I (HIGH SENSITIVITY)
Troponin I (High Sensitivity): 9 ng/L (ref <18)
Troponin I (High Sensitivity): 9 ng/L (ref <18)

## 2021-09-09 LAB — URINALYSIS, ROUTINE W REFLEX MICROSCOPIC
Bilirubin Urine: NEGATIVE
Glucose, UA: >=500 mg/dL — AB
Ketones, ur: 80 mg/dL — AB
Nitrite: NEGATIVE
Protein, ur: NEGATIVE mg/dL
Specific Gravity, Urine: 1.031 — ABNORMAL HIGH (ref 1.005–1.030)
pH: 5 (ref 5.0–8.0)

## 2021-09-09 LAB — BLOOD GAS, ARTERIAL
Acid-base deficit: 0.7 mmol/L (ref 0.0–2.0)
Bicarbonate: 24.2 mmol/L (ref 20.0–28.0)
O2 Saturation: 95.4 %
Patient temperature: 37
pCO2 arterial: 40 mmHg (ref 32.0–48.0)
pH, Arterial: 7.39 (ref 7.350–7.450)
pO2, Arterial: 79 mmHg — ABNORMAL LOW (ref 83.0–108.0)

## 2021-09-09 LAB — RESP PANEL BY RT-PCR (FLU A&B, COVID) ARPGX2
Influenza A by PCR: NEGATIVE
Influenza B by PCR: NEGATIVE
SARS Coronavirus 2 by RT PCR: NEGATIVE

## 2021-09-09 LAB — MAGNESIUM: Magnesium: 1.6 mg/dL — ABNORMAL LOW (ref 1.7–2.4)

## 2021-09-09 LAB — LACTIC ACID, PLASMA
Lactic Acid, Venous: 1.3 mmol/L (ref 0.5–1.9)
Lactic Acid, Venous: 2.2 mmol/L (ref 0.5–1.9)

## 2021-09-09 LAB — D-DIMER, QUANTITATIVE: D-Dimer, Quant: 2.18 ug/mL-FEU — ABNORMAL HIGH (ref 0.00–0.50)

## 2021-09-09 LAB — TSH
TSH: 1.204 u[IU]/mL (ref 0.350–4.500)
TSH: 1.571 u[IU]/mL (ref 0.350–4.500)

## 2021-09-09 LAB — CBG MONITORING, ED
Glucose-Capillary: 313 mg/dL — ABNORMAL HIGH (ref 70–99)
Glucose-Capillary: 284 mg/dL — ABNORMAL HIGH (ref 70–99)

## 2021-09-09 LAB — BRAIN NATRIURETIC PEPTIDE: B Natriuretic Peptide: 119.6 pg/mL — ABNORMAL HIGH (ref 0.0–100.0)

## 2021-09-09 MED ORDER — INSULIN ASPART 100 UNIT/ML IJ SOLN
4.0000 [IU] | Freq: Three times a day (TID) | INTRAMUSCULAR | Status: DC
  Administered 2021-09-09 – 2021-09-14 (×13): 4 [IU] via SUBCUTANEOUS
  Filled 2021-09-09 (×13): qty 1

## 2021-09-09 MED ORDER — CARVEDILOL 6.25 MG PO TABS
6.2500 mg | ORAL_TABLET | Freq: Two times a day (BID) | ORAL | Status: DC
  Administered 2021-09-09 – 2021-09-14 (×10): 6.25 mg via ORAL
  Filled 2021-09-09 (×11): qty 1

## 2021-09-09 MED ORDER — DILTIAZEM HCL ER COATED BEADS 240 MG PO CP24
240.0000 mg | ORAL_CAPSULE | Freq: Every day | ORAL | Status: DC
  Filled 2021-09-09: qty 1

## 2021-09-09 MED ORDER — CARVEDILOL 6.25 MG PO TABS
6.2500 mg | ORAL_TABLET | Freq: Once | ORAL | Status: AC
  Administered 2021-09-09: 6.25 mg via ORAL
  Filled 2021-09-09: qty 1

## 2021-09-09 MED ORDER — KETOROLAC TROMETHAMINE 15 MG/ML IJ SOLN
15.0000 mg | Freq: Four times a day (QID) | INTRAMUSCULAR | Status: DC | PRN
  Filled 2021-09-09: qty 1

## 2021-09-09 MED ORDER — INSULIN GLARGINE-YFGN 100 UNIT/ML ~~LOC~~ SOLN
27.0000 [IU] | Freq: Every day | SUBCUTANEOUS | Status: DC
  Administered 2021-09-09 – 2021-09-10 (×2): 27 [IU] via SUBCUTANEOUS
  Filled 2021-09-09 (×3): qty 0.27

## 2021-09-09 MED ORDER — SODIUM CHLORIDE 0.9 % IV SOLN
1.0000 g | Freq: Every day | INTRAVENOUS | Status: AC
  Administered 2021-09-09 – 2021-09-13 (×5): 1 g via INTRAVENOUS
  Filled 2021-09-09: qty 10
  Filled 2021-09-09: qty 1
  Filled 2021-09-09 (×3): qty 10

## 2021-09-09 MED ORDER — ESCITALOPRAM OXALATE 10 MG PO TABS
10.0000 mg | ORAL_TABLET | Freq: Every day | ORAL | Status: DC
  Administered 2021-09-09 – 2021-09-14 (×6): 10 mg via ORAL
  Filled 2021-09-09 (×6): qty 1

## 2021-09-09 MED ORDER — APIXABAN 5 MG PO TABS
5.0000 mg | ORAL_TABLET | Freq: Two times a day (BID) | ORAL | Status: DC
  Administered 2021-09-12 – 2021-09-14 (×5): 5 mg via ORAL
  Filled 2021-09-09 (×6): qty 1

## 2021-09-09 MED ORDER — INSULIN ASPART 100 UNIT/ML IJ SOLN: 4.0000 [IU] | Freq: Three times a day (TID) | INTRAMUSCULAR | Status: DC

## 2021-09-09 MED ORDER — DILTIAZEM HCL 25 MG/5ML IV SOLN
10.0000 mg | Freq: Once | INTRAVENOUS | Status: AC
  Administered 2021-09-09: 10 mg via INTRAVENOUS

## 2021-09-09 MED ORDER — ONDANSETRON HCL 4 MG/2ML IJ SOLN
4.0000 mg | Freq: Once | INTRAMUSCULAR | Status: AC
  Administered 2021-09-09: 4 mg via INTRAVENOUS
  Filled 2021-09-09: qty 2

## 2021-09-09 MED ORDER — MAGNESIUM SULFATE 2 GM/50ML IV SOLN
2.0000 g | Freq: Once | INTRAVENOUS | Status: AC
  Administered 2021-09-09: 2 g via INTRAVENOUS
  Filled 2021-09-09: qty 50

## 2021-09-09 MED ORDER — IOHEXOL 350 MG/ML SOLN
75.0000 mL | Freq: Once | INTRAVENOUS | Status: AC | PRN
  Administered 2021-09-09: 75 mL via INTRAVENOUS

## 2021-09-09 MED ORDER — INSULIN ASPART 100 UNIT/ML IJ SOLN
8.0000 [IU] | Freq: Once | INTRAMUSCULAR | Status: AC
  Administered 2021-09-09: 8 [IU] via INTRAVENOUS
  Filled 2021-09-09: qty 1

## 2021-09-09 MED ORDER — ACETAMINOPHEN 325 MG PO TABS
650.0000 mg | ORAL_TABLET | Freq: Four times a day (QID) | ORAL | Status: DC | PRN
  Filled 2021-09-09: qty 2

## 2021-09-09 MED ORDER — SODIUM CHLORIDE 0.9 % IV BOLUS
1000.0000 mL | Freq: Once | INTRAVENOUS | Status: AC
  Administered 2021-09-09: 1000 mL via INTRAVENOUS

## 2021-09-09 MED ORDER — DILTIAZEM HCL ER COATED BEADS 120 MG PO CP24
240.0000 mg | ORAL_CAPSULE | Freq: Every day | ORAL | Status: DC
  Administered 2021-09-10 – 2021-09-14 (×5): 240 mg via ORAL
  Filled 2021-09-09 (×3): qty 2
  Filled 2021-09-09: qty 1
  Filled 2021-09-09: qty 2
  Filled 2021-09-09: qty 1

## 2021-09-09 MED ORDER — POLYETHYLENE GLYCOL 3350 17 G PO PACK
17.0000 g | PACK | Freq: Every day | ORAL | Status: DC
  Administered 2021-09-09 – 2021-09-14 (×5): 17 g via ORAL
  Filled 2021-09-09 (×6): qty 1

## 2021-09-09 MED ORDER — ACETAMINOPHEN 650 MG RE SUPP: 650.0000 mg | Freq: Four times a day (QID) | RECTAL | Status: DC | PRN

## 2021-09-09 MED ORDER — APIXABAN 5 MG PO TABS: 5.0000 mg | ORAL_TABLET | Freq: Two times a day (BID) | ORAL | Status: DC

## 2021-09-09 MED ORDER — DILTIAZEM HCL ER COATED BEADS 240 MG PO CP24
240.0000 mg | ORAL_CAPSULE | Freq: Once | ORAL | Status: AC
  Administered 2021-09-09: 240 mg via ORAL
  Filled 2021-09-09: qty 1

## 2021-09-09 MED ORDER — ACETAMINOPHEN 325 MG PO TABS
650.0000 mg | ORAL_TABLET | Freq: Four times a day (QID) | ORAL | Status: DC | PRN
  Administered 2021-09-13 (×2): 650 mg via ORAL
  Filled 2021-09-09: qty 2

## 2021-09-09 MED ORDER — DILTIAZEM HCL 25 MG/5ML IV SOLN
10.0000 mg | Freq: Once | INTRAVENOUS | Status: AC
Start: 2021-09-09 — End: 2021-09-09
  Administered 2021-09-09: 10 mg via INTRAVENOUS
  Filled 2021-09-09: qty 5

## 2021-09-09 MED ORDER — LORATADINE 10 MG PO TABS
10.0000 mg | ORAL_TABLET | Freq: Every day | ORAL | Status: DC
  Administered 2021-09-10 – 2021-09-14 (×5): 10 mg via ORAL
  Filled 2021-09-09 (×5): qty 1

## 2021-09-09 MED ORDER — INSULIN ASPART 100 UNIT/ML IJ SOLN
0.0000 [IU] | Freq: Three times a day (TID) | INTRAMUSCULAR | Status: DC
  Administered 2021-09-09: 11 [IU] via SUBCUTANEOUS
  Administered 2021-09-10: 12 [IU] via SUBCUTANEOUS
  Administered 2021-09-10: 3 [IU] via SUBCUTANEOUS
  Administered 2021-09-10: 5 [IU] via SUBCUTANEOUS
  Administered 2021-09-11: 3 [IU] via SUBCUTANEOUS
  Administered 2021-09-11: 11 [IU] via SUBCUTANEOUS
  Administered 2021-09-12: 2 [IU] via SUBCUTANEOUS
  Administered 2021-09-12: 5 [IU] via SUBCUTANEOUS
  Administered 2021-09-12: 11 [IU] via SUBCUTANEOUS
  Administered 2021-09-13: 3 [IU] via SUBCUTANEOUS
  Administered 2021-09-13: 5 [IU] via SUBCUTANEOUS
  Administered 2021-09-13: 11 [IU] via SUBCUTANEOUS
  Administered 2021-09-14: 8 [IU] via SUBCUTANEOUS
  Filled 2021-09-09 (×13): qty 1

## 2021-09-09 MED ORDER — SODIUM CHLORIDE 0.9 % IV SOLN: INTRAVENOUS | Status: AC

## 2021-09-09 MED ORDER — DILTIAZEM HCL 25 MG/5ML IV SOLN
10.0000 mg | Freq: Once | INTRAVENOUS | Status: AC
  Administered 2021-09-09: 10 mg via INTRAVENOUS
  Filled 2021-09-09: qty 5

## 2021-09-09 NOTE — H&P (Addendum)
History and Physical    Barbara Frank OMV:[id number] DOB: Jun 06, 1946 DOA: 09/09/2021  PCP: Sheral Apley  Patient coming from:fall weakness  I have personally briefly reviewed patient's old medical records in [place]  Chief Complaint: fall and weakness   HPI: Barbara Frank is a 76 y.o. female with medical history significant of  diabetes mellitus, atrial fibrillation on Eliquis, depression, renal cell carcinoma, lung nodule, who presents with fall BIB EMS with complaint of dizziness then fall. Patient on evaluation in ed was found to have afib with rvr to 150's as well as uncontrolled glucose in mid 500's. Patient states that she has been noncompliant with all her medications for the last week. Patient lives in an ALF and has had similar presentation as this 07/23/21. Patient it appears forgets to take her medications. Per patient she was in rehab for 30 days and had recently been discharged 2 weeks ago. She states on discharge she was not given medications. She notes she has some medications at home but ran out and it appears did not seek to refill her medications.  On ros she denies chest pain, sob, f/c/n/v/d /HA/dysuria but does note low appetite and some weight loss,urinary frequency in addition to chronic issue with balance necessitating use of walker. She currently states she feels she is at her baseline.  ED Course: Vitals:  Afeb, bp 118/93, hr 151 , r18 sat 98% on ra  Ekg: afib with rvr with RBBB, rate related st-twave changes Patient in ed was given ccb iv x 2,home carvedilol as well as IVFs with improved in her  HR to 100s.   Patient also noted to have + UA  Wbc 7.6, hgb13.3 Bnp 119 AG16 Na 131, bicard 19,glucose 529 Ce 9 Mag 1.6  Cxr :nad  IMPRESSION: Pelvis xray: nad CT head:   1. No evidence of acute intracranial abnormality. 2. Mild chronic small vessel ischemic changes within the cerebral white matter. 3. Mild generalized cerebral atrophy. 4. Mild  paranasal sinus disease, as described.   CT cervical spine:   1. No evidence of acute fracture to the cervical spine. 2. Straightening of the expected cervical lordosis. 3. Cervicothoracic levocurvature. 4. Trace C6-C7 and C7-T1 grade 1 anterolisthesis. 5. Cervical spondylosis, as described. 6. 1.6 cm left thyroid lobe nodule. A nonemergent thyroid ultrasound is recommended for further evaluation.   Patient admitted to observation for further monitoring.   Review of Systems: As per HPI otherwise 10 point review of systems negative.   Past Medical History:  Diagnosis Date   A-fib ([place])    Depression    Diabetes mellitus without complication ([place])    History of kidney stones    Kidney mass     Past Surgical History:  Procedure Laterality Date   GASTRIC BYPASS     HERNIA REPAIR     TONSILLECTOMY     URETEROSCOPY WITH HOLMIUM LASER LITHOTRIPSY Left      reports that she has never smoked. She has never used smokeless tobacco. She reports that she does not currently use alcohol. She reports that she does not currently use drugs.  Allergies  Allergen Reactions   Kiwi Extract     Family History  Problem Relation Age of Onset   Breast cancer Mother    Bladder Cancer Neg Hx    Kidney cancer Neg Hx    Prostate cancer Neg Hx    Prior to Admission medications   Medication Sig Start Date End Date Taking? Authorizing Provider  acetaminophen (TYLENOL) 325 MG tablet Take 2 tablets (650 mg total) by mouth every 6 (six) hours as needed for mild pain, fever or headache. 07/28/21   Barbara Slocumb, DO  apixaban (ELIQUIS) 5 MG TABS tablet Take 5 mg by mouth 2 (two) times daily.    [provider]  carvedilol (COREG) 6.25 MG tablet Take 1 tablet (6.25 mg total) by mouth 2 (two) times daily with a meal. 07/28/21   Nicole Kindred A, DO  cetirizine (ZYRTEC) 10 MG tablet Take 5 mg by mouth daily.    [provider]  diltiazem (CARDIZEM CD) 240 MG 24 hr capsule Take 1  capsule (240 mg total) by mouth every evening. 07/28/21   Nicole Kindred A, DO  escitalopram (LEXAPRO) 10 MG tablet Take 10 mg by mouth daily.    [provider]  feeding supplement, GLUCERNA SHAKE, (GLUCERNA SHAKE) LIQD Take 237 mLs by mouth 3 (three) times daily between meals. Patient not taking: Reported on 06/29/2021 03/16/21   Loletha Grayer, MD  insulin aspart (NOVOLOG) 100 UNIT/ML injection Inject 4 Units into the skin 3 (three) times daily with meals. Patient not taking: Reported on 05/06/2021 03/16/21   Loletha Grayer, MD  insulin glargine (LANTUS) 100 UNIT/ML injection Inject 0.27 mLs (27 Units total) into the skin 2 (two) times daily. 07/28/21   Barbara Slocumb, DO  metFORMIN (GLUCOPHAGE-XR) 500 MG 24 hr tablet Take 1 tablet (500 mg total) by mouth 2 (two) times daily. 07/28/21   Barbara Slocumb, DO  Multiple Vitamin (MULTIVITAMIN WITH MINERALS) TABS tablet Take 1 tablet by mouth daily. 07/29/21   Barbara Slocumb, DO  polyethylene glycol (MIRALAX / GLYCOLAX) 17 g packet Take 17 g by mouth daily. Hold if loose stools or frequent BMs 07/29/21   Barbara Slocumb, DO    Physical Exam: Vitals:   09/09/21 1300 09/09/21 1326 09/09/21 1330 09/09/21 1335  BP: (!) 141/88  (!) 143/60 (!) 143/60  Pulse: (!) 103 (!) 116 97   Resp: 19  (!) 21   Temp:      TempSrc:      SpO2: 97%  99%   Weight:      Height:        Constitutional: NAD, calm, comfortable Vitals:   09/09/21 1300 09/09/21 1326 09/09/21 1330 09/09/21 1335  BP: (!) 141/88  (!) 143/60 (!) 143/60  Pulse: (!) 103 (!) 116 97   Resp: 19  (!) 21   Temp:      TempSrc:      SpO2: 97%  99%   Weight:      Height:       Eyes: PERRL, lids and conjunctivae normal ENMT: Mucous membranes are dry. Posterior pharynx clear of any exudate or lesions.Normal dentition.  Neck: normal, supple, no masses, no thyromegaly Respiratory: clear to auscultation bilaterally, no wheezing, no crackles. Normal respiratory effort. No  accessory muscle use.  Cardiovascular: IRRegular rate and rhythm, no murmurs / rubs / gallops. No extremity edema. 2+ pedal pulses. No carotid bruits.  Abdomen: no tenderness, no masses palpated. No hepatosplenomegaly. Bowel sounds positive.  Musculoskeletal: no clubbing / cyanosis. No joint deformity upper and lower extremities. Good ROM, no contractures. Normal muscle tone.  Skin: no rashes, lesions, ulcers. No induration Neurologic: CN 2-12 grossly intact. Sensation intact, DTR normal. Strength 5/5 in all 4.  Psychiatric: Normal judgment and insight. Alert and oriented x 3. Normal mood.    Labs on Admission: I have personally reviewed following labs  and imaging studies  CBC: Recent Labs  Lab 09/09/21 1137  WBC 7.6  NEUTROABS 5.5  HGB 13.3  HCT 42.7  MCV 87.1  PLT 591   Basic Metabolic Panel: Recent Labs  Lab 09/09/21 1137  NA 131*  K 4.5  CL 96*  CO2 19*  GLUCOSE 529*  BUN 15  CREATININE 0.66  CALCIUM 10.5*  MG 1.6*   GFR: Estimated Creatinine Clearance: 57.6 mL/min (by C-G formula based on SCr of 0.66 mg/dL). Liver Function Tests: No results for input(s): AST, ALT, ALKPHOS, BILITOT, PROT, ALBUMIN in the last 168 hours. No results for input(s): LIPASE, AMYLASE in the last 168 hours. No results for input(s): AMMONIA in the last 168 hours. Coagulation Profile: No results for input(s): INR, PROTIME in the last 168 hours. Cardiac Enzymes: No results for input(s): CKTOTAL, CKMB, CKMBINDEX, TROPONINI in the last 168 hours. BNP (last 3 results) No results for input(s): PROBNP in the last 8760 hours. HbA1C: No results for input(s): HGBA1C in the last 72 hours. CBG: No results for input(s): GLUCAP in the last 168 hours. Lipid Profile: No results for input(s): CHOL, HDL, LDLCALC, TRIG, CHOLHDL, LDLDIRECT in the last 72 hours. Thyroid Function Tests: No results for input(s): TSH, T4TOTAL, FREET4, T3FREE, THYROIDAB in the last 72 hours. Anemia Panel: No results for  input(s): VITAMINB12, FOLATE, FERRITIN, TIBC, IRON, RETICCTPCT in the last 72 hours. Urine analysis:    Component Value Date/Time   COLORURINE YELLOW (A) 09/09/2021 1137   APPEARANCEUR HAZY (A) 09/09/2021 1137   LABSPEC 1.031 (H) 09/09/2021 1137   PHURINE 5.0 09/09/2021 1137   GLUCOSEU >=500 (A) 09/09/2021 1137   HGBUR SMALL (A) 09/09/2021 1137   BILIRUBINUR NEGATIVE 09/09/2021 1137   KETONESUR 80 (A) 09/09/2021 1137   PROTEINUR NEGATIVE 09/09/2021 1137   NITRITE NEGATIVE 09/09/2021 1137   LEUKOCYTESUR LARGE (A) 09/09/2021 1137    Radiological Exams on Admission: DG Pelvis 1-2 Views  Result Date: 09/09/2021 CLINICAL DATA:  Fall, head injury EXAM: PELVIS - 1-2 VIEW COMPARISON:  None. FINDINGS: There is no evidence of pelvic fracture or diastasis. No pelvic bone lesions are seen. IMPRESSION: Negative. Electronically Signed   By: Franchot Gallo M.D.   On: 09/09/2021 12:35   CT HEAD WO CONTRAST (5MM)  Result Date: 09/09/2021 CLINICAL DATA:  Head trauma, moderate/severe. Additional history provided: Fall (hitting head). Dizziness prior to fall. Patient on Eliquis. Neck trauma. EXAM: CT HEAD WITHOUT CONTRAST CT CERVICAL SPINE WITHOUT CONTRAST TECHNIQUE: Multidetector CT imaging of the head and cervical spine was performed following the standard protocol without intravenous contrast. Multiplanar CT image reconstructions of the cervical spine were also generated. COMPARISON:  Head CT 07/23/2021. Brain MRI 03/06/2021. CT of the cervical spine 07/23/2021. FINDINGS: CT HEAD FINDINGS Brain: Mild generalized cerebral atrophy. Mild ill-defined hypoattenuation within the cerebral white matter, nonspecific but compatible with chronic small vessel ischemic disease. There is no acute intracranial hemorrhage. No demarcated cortical infarct. No extra-axial fluid collection. No evidence of an intracranial mass. No midline shift. Vascular: No hyperdense vessel.  Atherosclerotic calcifications. Skull: Normal.  Negative for fracture or focal lesion. Sinuses/Orbits: Visualized orbits show no acute finding. Mild mucosal thickening within the right maxillary sinus. Trace mucosal thickening within the bilateral ethmoid sinuses. CT CERVICAL SPINE FINDINGS Alignment: Cervicothoracic levocurvature. Straightening of the expected cervical lordosis. Trace C6-C7 and C7-T1 grade 1 anterolisthesis. Skull base and vertebrae: The basion-dental and atlanto-dental intervals are maintained.No evidence of acute fracture to the cervical spine. Soft tissues and spinal canal: No  prevertebral fluid or swelling. No visible canal hematoma. Disc levels: Cervical spondylosis with multilevel disc space narrowing, disc bulges, uncovertebral hypertrophy and facet arthrosis. No appreciable high-grade spinal canal stenosis. Multilevel bony neural foraminal narrowing. Degenerative changes are also present about the C1-C2 articulation. Upper chest: No consolidation within the imaged lung apices. No visible pneumothorax. Other: 1.6 cm left thyroid lobe nodule. IMPRESSION: CT head: 1. No evidence of acute intracranial abnormality. 2. Mild chronic small vessel ischemic changes within the cerebral white matter. 3. Mild generalized cerebral atrophy. 4. Mild paranasal sinus disease, as described. CT cervical spine: 1. No evidence of acute fracture to the cervical spine. 2. Straightening of the expected cervical lordosis. 3. Cervicothoracic levocurvature. 4. Trace C6-C7 and C7-T1 grade 1 anterolisthesis. 5. Cervical spondylosis, as described. 6. 1.6 cm left thyroid lobe nodule. A nonemergent thyroid ultrasound is recommended for further evaluation. Electronically Signed   By: Kellie Simmering D.O.   On: 09/09/2021 12:10   CT Cervical Spine Wo Contrast  Result Date: 09/09/2021 CLINICAL DATA:  Head trauma, moderate/severe. Additional history provided: Fall (hitting head). Dizziness prior to fall. Patient on Eliquis. Neck trauma. EXAM: CT HEAD WITHOUT CONTRAST CT  CERVICAL SPINE WITHOUT CONTRAST TECHNIQUE: Multidetector CT imaging of the head and cervical spine was performed following the standard protocol without intravenous contrast. Multiplanar CT image reconstructions of the cervical spine were also generated. COMPARISON:  Head CT 07/23/2021. Brain MRI 03/06/2021. CT of the cervical spine 07/23/2021. FINDINGS: CT HEAD FINDINGS Brain: Mild generalized cerebral atrophy. Mild ill-defined hypoattenuation within the cerebral white matter, nonspecific but compatible with chronic small vessel ischemic disease. There is no acute intracranial hemorrhage. No demarcated cortical infarct. No extra-axial fluid collection. No evidence of an intracranial mass. No midline shift. Vascular: No hyperdense vessel.  Atherosclerotic calcifications. Skull: Normal. Negative for fracture or focal lesion. Sinuses/Orbits: Visualized orbits show no acute finding. Mild mucosal thickening within the right maxillary sinus. Trace mucosal thickening within the bilateral ethmoid sinuses. CT CERVICAL SPINE FINDINGS Alignment: Cervicothoracic levocurvature. Straightening of the expected cervical lordosis. Trace C6-C7 and C7-T1 grade 1 anterolisthesis. Skull base and vertebrae: The basion-dental and atlanto-dental intervals are maintained.No evidence of acute fracture to the cervical spine. Soft tissues and spinal canal: No prevertebral fluid or swelling. No visible canal hematoma. Disc levels: Cervical spondylosis with multilevel disc space narrowing, disc bulges, uncovertebral hypertrophy and facet arthrosis. No appreciable high-grade spinal canal stenosis. Multilevel bony neural foraminal narrowing. Degenerative changes are also present about the C1-C2 articulation. Upper chest: No consolidation within the imaged lung apices. No visible pneumothorax. Other: 1.6 cm left thyroid lobe nodule. IMPRESSION: CT head: 1. No evidence of acute intracranial abnormality. 2. Mild chronic small vessel ischemic changes  within the cerebral white matter. 3. Mild generalized cerebral atrophy. 4. Mild paranasal sinus disease, as described. CT cervical spine: 1. No evidence of acute fracture to the cervical spine. 2. Straightening of the expected cervical lordosis. 3. Cervicothoracic levocurvature. 4. Trace C6-C7 and C7-T1 grade 1 anterolisthesis. 5. Cervical spondylosis, as described. 6. 1.6 cm left thyroid lobe nodule. A nonemergent thyroid ultrasound is recommended for further evaluation. Electronically Signed   By: Kellie Simmering D.O.   On: 09/09/2021 12:10   DG Chest Portable 1 View  Result Date: 09/09/2021 CLINICAL DATA:  Fall a with head injury.  Weakness. EXAM: PORTABLE CHEST 1 VIEW COMPARISON:  07/23/2021 FINDINGS: Cardiac enlargement. Normal pulmonary vascularity. Negative for heart failure. Lungs clear without infiltrate or effusion. No acute skeletal abnormality. IMPRESSION: No active disease. Electronically  Signed   By: Franchot Gallo M.D.   On: 09/09/2021 12:34    EKG: Independently reviewed.see above  Assessment/Plan Afib rvr  -now controlled s/p ccb iv x 2 and restarting rate control medications  -hr now 100 -resume home regimen, cardizem , diltiazem  -As needed IV metoprolol 2.5 mg every 3 hours -s/p resume anticoag in 48 hours   Uncontrolled DMII r/o mild DKA -r/o dka - check betahydroxybuterate  -vbg  -start on home lantus 27 units daily, and sliding scale with coverage -check a1c  -ivfs   UTI  -start on ctx  -f/u on urine culture   Hypomagnesemia  -repleated  Left thyroids nodule  -will need thyroid u/s as out patient -check tsh  Fall -related to dizziness from afib  -pt/ot prior to d/c Depression; Continue ssri   RCC Follow swith Yu   Lung nodule Followed by oncology       DVT prophylaxis: scd Code Status: DNR Family Communication:none at beside  /she notes her niece is no longer her poa but this has not been changes as off yet. She states this is due to her niece  being ill Disposition Plan admit to med tele observation patient  expected to be admitted less than 2 midnights Consults called: n/a Admission status: Med tele   Clance Boll MD [facility]  If 7PM-7AM, please contact night-coverage [email or web address] Password [email or web address]  09/09/2021, 2:42 PM

## 2021-09-09 NOTE — ED Notes (Signed)
Pt in CT now.

## 2021-09-09 NOTE — ED Triage Notes (Signed)
[facility] reports pt was at [facility] and had a fall hitting her head. Pt states she was dizzy before the fall. Pt is on eliquis. Pt has not been talking her insulin per EMS, unsure if she took her Afib meds.

## 2021-09-09 NOTE — ED Provider Notes (Signed)
[facility] Provider Note    Event Date/Time   First MD Initiated Contact with Patient 09/09/21 1122     (approximate)   History   Fall   HPI  Barbara Frank is a 76 y.o. female here with generalized weakness, fall.  Patient states that earlier today, she was at [facility].  She has been feeling somewhat unwell for the last day or so.  She has been very thirsty.  She has been weak.  She states that while shopping, she had somewhat difficulty reaching things in the top shelf and felt weak.  When she was outside, she bent over to put the dog down and fell, hitting her head.  She does not think she lost consciousness.  She states she feels persistently, mildly weak.  Denies any chest pain.  No palpitations.  She does admit that she has not been taking her A. fib or other medications as prescribed.  She states it has been at least several days since she took anything.  Denies any fevers.  No other complaints.     Physical Exam   Triage Vital Signs: ED Triage Vitals  Enc Vitals Group     BP 09/09/21 1118 (!) 118/93     Pulse Rate 09/09/21 1118 (!) 151     Resp 09/09/21 1118 18     Temp 09/09/21 1118 98.2 F (36.8 C)     Temp Source 09/09/21 1118 Oral     SpO2 09/09/21 1118 98 %     Weight 09/09/21 1113 160 lb (72.6 kg)     Height 09/09/21 1113 5\' 2"  (1.575 m)     Head Circumference --      Peak Flow --      Pain Score 09/09/21 1112 7     Pain Loc --      Pain Edu? --      Excl. in [name]? --     Most recent vital signs: Vitals:   09/09/21 1430 09/09/21 1445  BP: 133/87   Pulse: (!) 110 100  Resp: 15 16  Temp:    SpO2: 97% 97%     General: Awake, no distress.  CV:  Good peripheral perfusion.  Tachycardic with a regular rate.  Pulses 1+ and symmetric.  Cap refill intact. Resp:  Normal effort.  Slight tachypnea noted.  No rales.  No rhonchi.  Speaking in full sentences. Abd:  No distention.  No tenderness.  No rebound or guarding. Other:  Dry mucous  membranes.  Skin turgor slightly decreased.   ED Results / Procedures / Treatments   Labs (all labs ordered are listed, but only abnormal results are displayed) Labs Reviewed  BASIC METABOLIC PANEL - Abnormal; Notable for the following components:      Result Value   Sodium 131 (*)    Chloride 96 (*)    CO2 19 (*)    Glucose, Bld 529 (*)    Calcium 10.5 (*)    Anion gap 16 (*)    All other components within normal limits  BRAIN NATRIURETIC PEPTIDE - Abnormal; Notable for the following components:   B Natriuretic Peptide 119.6 (*)    All other components within normal limits  MAGNESIUM - Abnormal; Notable for the following components:   Magnesium 1.6 (*)    All other components within normal limits  URINALYSIS, ROUTINE W REFLEX MICROSCOPIC - Abnormal; Notable for the following components:   Color, Urine YELLOW (*)    APPearance HAZY (*)  Specific Gravity, Urine 1.031 (*)    Glucose, UA >=500 (*)    Hgb urine dipstick SMALL (*)    Ketones, ur 80 (*)    Leukocytes,Ua LARGE (*)    Bacteria, UA FEW (*)    All other components within normal limits  D-DIMER, QUANTITATIVE - Abnormal; Notable for the following components:   D-Dimer, Quant 2.18 (*)    All other components within normal limits  RESP PANEL BY RT-PCR (FLU A&B, COVID) ARPGX2  CBC WITH DIFFERENTIAL/PLATELET  BETA-HYDROXYBUTYRIC ACID  TSH  LACTIC ACID, PLASMA  LACTIC ACID, PLASMA  TROPONIN I (HIGH SENSITIVITY)  TROPONIN I (HIGH SENSITIVITY)     EKG Suspected atrial fibrillation with rapid ventricular response, ventricular rate 154.  QRS 132, QTc 525.  No acute ST elevations or depressions.  Nonspecific T wave changes likely rate related.   RADIOLOGY CT head/C-spine: Degenerative changes on my review, agree with radiology assessment.  No acute intracranial abnormality noted on my review of images. Chest x-ray: Reviewed by me, no acute abnormality.  Agree with radiology interpretation. Pelvis x-ray: No obvious  fracture or injury, agree with radiology interpretation.    PROCEDURES:  Critical Care performed: No  .1-3 Lead EKG Interpretation Performed by: Duffy Bruce, MD Authorized by: Duffy Bruce, MD     Interpretation: normal     ECG rate:  100-120   ECG rate assessment: tachycardic     Rhythm: atrial fibrillation     Ectopy: none     Conduction: normal   Comments:     Indication: AFib RVR .Critical Care Performed by: Duffy Bruce, MD Authorized by: Duffy Bruce, MD   Critical care provider statement:    Critical care time (minutes):  30   Critical care time was exclusive of:  Separately billable procedures and treating other patients   Critical care was necessary to treat or prevent imminent or life-threatening deterioration of the following conditions:  Cardiac failure, circulatory failure and respiratory failure   Critical care was time spent personally by me on the following activities:  Development of treatment plan with patient or surrogate, discussions with consultants, evaluation of patient's response to treatment, examination of patient, ordering and review of laboratory studies, ordering and review of radiographic studies, ordering and performing treatments and interventions, pulse oximetry, re-evaluation of patient's condition and review of old charts   MEDICATIONS ORDERED IN ED: Medications  cefTRIAXone (ROCEPHIN) 1 g in sodium chloride 0.9 % 100 mL IVPB (1 g Intravenous New Bag/Given 09/09/21 1516)  apixaban (ELIQUIS) tablet 5 mg (has no administration in time range)  insulin glargine-yfgn (SEMGLEE) injection 27 Units (has no administration in time range)  acetaminophen (TYLENOL) tablet 650 mg (has no administration in time range)  carvedilol (COREG) tablet 6.25 mg (has no administration in time range)  escitalopram (LEXAPRO) tablet 10 mg (has no administration in time range)  insulin aspart (novoLOG) injection 4 Units (has no administration in time range)   polyethylene glycol (MIRALAX / GLYCOLAX) packet 17 g (has no administration in time range)  loratadine (CLARITIN) tablet 10 mg (has no administration in time range)  insulin aspart (novoLOG) injection 0-15 Units (has no administration in time range)  0.9 %  sodium chloride infusion (has no administration in time range)  acetaminophen (TYLENOL) tablet 650 mg (has no administration in time range)    Or  acetaminophen (TYLENOL) suppository 650 mg (has no administration in time range)  ketorolac (TORADOL) 15 MG/ML injection 15 mg (has no administration in time range)  diltiazem (  CARDIZEM CD) 24 hr capsule 240 mg (has no administration in time range)  diltiazem (CARDIZEM) injection 10 mg (10 mg Intravenous Given 09/09/21 1217)  sodium chloride 0.9 % bolus 1,000 mL (0 mLs Intravenous Stopped 09/09/21 1327)  insulin aspart (novoLOG) injection 8 Units (8 Units Intravenous Given 09/09/21 1324)  diltiazem (CARDIZEM) injection 10 mg (10 mg Intravenous Given 09/09/21 1325)  carvedilol (COREG) tablet 6.25 mg (6.25 mg Oral Given 09/09/21 1326)  diltiazem (CARDIZEM CD) 24 hr capsule 240 mg (240 mg Oral Given 09/09/21 1335)  magnesium sulfate IVPB 2 g 50 mL (0 g Intravenous Stopped 09/09/21 1508)  diltiazem (CARDIZEM) injection 10 mg (10 mg Intravenous Given 09/09/21 1434)     IMPRESSION / MDM / ASSESSMENT AND PLAN / ED COURSE  I reviewed the triage vital signs and the nursing notes.                               The patient is on the cardiac monitor to evaluate for evidence of arrhythmia and/or significant heart rate changes.   Ddx: Afib RVR, diabetic ketoacidosis, UTI causing afib/dka, dehydration, metabolic encephalopathy, traumatic encephalopathy/weakness, TBI.  76 yo F here with generalized weakness, fall. Re: fall - CT Head/C Spine reviewed and are negative. No signs of traumatic injury on exam. Re: etiology for fall, pt arrives in AFib RVR with marked hyperglycemia. She admits to not taking her meds for "at  least a few days." Pt given IV dilt x 3 doses, PO coreg and PO dilt with gradual improvement in HR. BP remains stable. Pt also markedly hyperglycemic on BMP with Glu 529, Co2 19, AG 16 c/w possible early/mild DKA. Pt UA also has moderate ketonuria. However, I suspect this is multifactorial 2/2 her dehydration/poor PO intake in addition to possible mild DKA. Pt given IVF boluses, IV insulin 0.1/kg, and will recheck BMP and BHB. I suspect pt can be managed without insulin gtt. She is improving clinically with fluids, rate control, iv insulin and treatment above.   MEDICATIONS GIVEN IN ED: Medications  cefTRIAXone (ROCEPHIN) 1 g in sodium chloride 0.9 % 100 mL IVPB (1 g Intravenous New Bag/Given 09/09/21 1516)  apixaban (ELIQUIS) tablet 5 mg (has no administration in time range)  insulin glargine-yfgn (SEMGLEE) injection 27 Units (has no administration in time range)  acetaminophen (TYLENOL) tablet 650 mg (has no administration in time range)  carvedilol (COREG) tablet 6.25 mg (has no administration in time range)  escitalopram (LEXAPRO) tablet 10 mg (has no administration in time range)  insulin aspart (novoLOG) injection 4 Units (has no administration in time range)  polyethylene glycol (MIRALAX / GLYCOLAX) packet 17 g (has no administration in time range)  loratadine (CLARITIN) tablet 10 mg (has no administration in time range)  insulin aspart (novoLOG) injection 0-15 Units (has no administration in time range)  0.9 %  sodium chloride infusion (has no administration in time range)  acetaminophen (TYLENOL) tablet 650 mg (has no administration in time range)    Or  acetaminophen (TYLENOL) suppository 650 mg (has no administration in time range)  ketorolac (TORADOL) 15 MG/ML injection 15 mg (has no administration in time range)  diltiazem (CARDIZEM CD) 24 hr capsule 240 mg (has no administration in time range)  diltiazem (CARDIZEM) injection 10 mg (10 mg Intravenous Given 09/09/21 1217)  sodium  chloride 0.9 % bolus 1,000 mL (0 mLs Intravenous Stopped 09/09/21 1327)  insulin aspart (novoLOG) injection 8 Units (8 Units  Intravenous Given 09/09/21 1324)  diltiazem (CARDIZEM) injection 10 mg (10 mg Intravenous Given 09/09/21 1325)  carvedilol (COREG) tablet 6.25 mg (6.25 mg Oral Given 09/09/21 1326)  diltiazem (CARDIZEM CD) 24 hr capsule 240 mg (240 mg Oral Given 09/09/21 1335)  magnesium sulfate IVPB 2 g 50 mL (0 g Intravenous Stopped 09/09/21 1508)  diltiazem (CARDIZEM) injection 10 mg (10 mg Intravenous Given 09/09/21 1434)      Consults: Discussed case and care with Dr. Marcello Moores of hospitalist service, who will admit.   EMR reviewed reviewed hospitalization on 11/25, where she was admitted for A. fib RVR, responded to treatment.  This is in the setting of nonadherence with her medications.    FINAL CLINICAL IMPRESSION(S) / ED DIAGNOSES   Final diagnoses:  Fall  Atrial fibrillation with rapid ventricular response ([place])  Hyperglycemia  Dehydration     Rx / DC Orders   ED Discharge Orders     None        Note:  This document was prepared using Dragon voice recognition software and may include unintentional dictation errors.   Duffy Bruce, MD 09/09/21 1600

## 2021-09-10 DIAGNOSIS — I4811 Longstanding persistent atrial fibrillation: Secondary | ICD-10-CM | POA: Diagnosis not present

## 2021-09-10 LAB — CBC
HCT: 38 % (ref 36.0–46.0)
Hemoglobin: 12 g/dL (ref 12.0–15.0)
MCH: 27.3 pg (ref 26.0–34.0)
MCHC: 31.6 g/dL (ref 30.0–36.0)
MCV: 86.4 fL (ref 80.0–100.0)
Platelets: 268 10*3/uL (ref 150–400)
RBC: 4.4 MIL/uL (ref 3.87–5.11)
RDW: 15 % (ref 11.5–15.5)
WBC: 6.6 10*3/uL (ref 4.0–10.5)
nRBC: 0 % (ref 0.0–0.2)

## 2021-09-10 LAB — COMPREHENSIVE METABOLIC PANEL
ALT: 9 U/L (ref 0–44)
AST: 12 U/L — ABNORMAL LOW (ref 15–41)
Albumin: 3 g/dL — ABNORMAL LOW (ref 3.5–5.0)
Alkaline Phosphatase: 96 U/L (ref 38–126)
Anion gap: 8 (ref 5–15)
BUN: 14 mg/dL (ref 8–23)
CO2: 24 mmol/L (ref 22–32)
Calcium: 9.7 mg/dL (ref 8.9–10.3)
Chloride: 103 mmol/L (ref 98–111)
Creatinine, Ser: 0.61 mg/dL (ref 0.44–1.00)
GFR, Estimated: >60 mL/min (ref >60)
Glucose, Bld: 311 mg/dL — ABNORMAL HIGH (ref 70–99)
Potassium: 4.5 mmol/L (ref 3.5–5.1)
Sodium: 135 mmol/L (ref 135–145)
Total Bilirubin: 1 mg/dL (ref 0.3–1.2)
Total Protein: 6 g/dL — ABNORMAL LOW (ref 6.5–8.1)

## 2021-09-10 LAB — HEMOGLOBIN A1C
Hgb A1c MFr Bld: 11.1 % — ABNORMAL HIGH (ref 4.8–5.6)
Mean Plasma Glucose: 271.87 mg/dL

## 2021-09-10 LAB — CBG MONITORING, ED
Glucose-Capillary: 271 mg/dL — ABNORMAL HIGH (ref 70–99)
Glucose-Capillary: 238 mg/dL — ABNORMAL HIGH (ref 70–99)
Glucose-Capillary: 167 mg/dL — ABNORMAL HIGH (ref 70–99)
Glucose-Capillary: 176 mg/dL — ABNORMAL HIGH (ref 70–99)

## 2021-09-10 LAB — MAGNESIUM: Magnesium: 1.7 mg/dL (ref 1.7–2.4)

## 2021-09-10 LAB — PHOSPHORUS: Phosphorus: 2.8 mg/dL (ref 2.5–4.6)

## 2021-09-10 MED ORDER — SODIUM CHLORIDE 0.9 % IV SOLN: INTRAVENOUS | Status: AC

## 2021-09-10 NOTE — Progress Notes (Signed)
Inpatient Diabetes Program Recommendations  AACE/ADA: New Consensus Statement on Inpatient Glycemic Control (2015)  Target Ranges:  Prepandial:   less than 140 mg/dL      Peak postprandial:   less than 180 mg/dL (1-2 hours)      Critically ill patients:  140 - 180 mg/dL   Lab Results  Component Value Date   GLUCAP 271 (H) 09/10/2021   HGBA1C 10.3 (H) 07/24/2021    Review of Glycemic Control  Latest Reference Range & Units 07/31/21 11:46 09/09/21 16:18 09/09/21 21:25 09/10/21 07:34  Glucose-Capillary 70 - 99 mg/dL 255 (H) 313 (H) 284 (H) 271 (H)  (H): Data is abnormally high  Diabetes history: DM2 Outpatient Diabetes medications: Lantus 27 units BID, Metformin 500 mg BID, Novolog 4 units mctid (not taking novolog) Current orders for Inpatient glycemic control: Semglee 27 QD, Novolog 0-15 units TID and 4 units mctid  Inpatient Diabetes Program Recommendations:    Semglee 27 units BID Novolog 0-5 QHS  Will continue to follow while inpatient.  Thank you, Reche Dixon, MSN, RN Diabetes Coordinator Inpatient Diabetes Program [phone] (team pager from 8a-5p)

## 2021-09-10 NOTE — Evaluation (Signed)
Physical Therapy Evaluation Patient Details Name: Barbara Frank MRN: [id number] DOB: 1946-02-02 Today's Date: 09/10/2021  History of Present Illness  Barbara Frank is a 76 y.o. female with medical history significant of   diabetes mellitus, atrial fibrillation on Eliquis, depression, renal cell carcinoma, lung nodule, who presents with fall BIB EMS with complaint of dizziness then fall. Patient on evaluation in ed was found to have afib with rvr to 150's as well as uncontrolled glucose in mid 500's. Patient states that she has been noncompliant with all her medications for the last week. Patient lives in an ALF   Clinical Impression  Patient received on stretcher. She is agreeable to PT assessment. Patient required min assist for supine to sit. Patient required min assist for sit to stand. Posterior leaning with initial standing balance using RW. Patient is able to ambulate in room 40' with RW and min guard for safety/balance. She will continue to benefit from skilled PT while here to improve safety, strength and independence.              Recommendations for follow up therapy are one component of a multi-disciplinary discharge planning process, led by the attending physician.  Recommendations may be updated based on patient status, additional functional criteria and insurance authorization.  Follow Up Recommendations Home health PT    Assistance Recommended at Discharge Intermittent Supervision/Assistance  Patient can return home with the following  A little help with walking and/or transfers;A little help with bathing/dressing/bathroom;Direct supervision/assist for medications management    Equipment Recommendations None recommended by PT  Recommendations for Other Services       Functional Status Assessment Patient has had a recent decline in their functional status and demonstrates the ability to make significant improvements in function in a reasonable and predictable amount of time.      Precautions / Restrictions Precautions Precautions: Fall Restrictions Weight Bearing Restrictions: No      Mobility  Bed Mobility Overal bed mobility: Needs Assistance Bed Mobility: Supine to Sit     Supine to sit: HOB elevated;Min assist     General bed mobility comments: min assist to raise trunk to seated position    Transfers Overall transfer level: Needs assistance Equipment used: Rolling walker (2 wheels) Transfers: Sit to/from Stand Sit to Stand: Min assist;From elevated surface           General transfer comment: posterior lean with initial standing    Ambulation/Gait Ambulation/Gait assistance: Min guard;Supervision Gait Distance (Feet): 40 Feet Assistive device: Rolling walker (2 wheels) Gait Pattern/deviations: Step-through pattern;Decreased step length - right;Decreased step length - left;Decreased stride length Gait velocity: decr     General Gait Details: Min guard for safety wtih ambulation due to fall history and imbalance.  Stairs            Wheelchair Mobility    Modified Rankin (Stroke Patients Only)       Balance Overall balance assessment: Needs assistance;History of Falls Sitting-balance support: Feet supported Sitting balance-Leahy Scale: Good     Standing balance support: Bilateral upper extremity supported;During functional activity;Reliant on assistive device for balance Standing balance-Leahy Scale: Fair Standing balance comment: posterior lean with initial standing                             Pertinent Vitals/Pain Pain Assessment: Faces Faces Pain Scale: Hurts a little bit Pain Location: bottom from falling Pain Descriptors / Indicators: Discomfort;Sore Pain Intervention(s): Monitored during session  Home Living Family/patient expects to be discharged to:: Assisted living                 Home Equipment: Conservation officer, nature (2 wheels) Additional Comments: Patient reports she lives at [facility]    Prior Function Prior Level of Function : Independent/Modified Independent             Mobility Comments: Pt reported using RW for mobility       Hand Dominance   Dominant Hand: Right    Extremity/Trunk Assessment   Upper Extremity Assessment Upper Extremity Assessment: Generalized weakness    Lower Extremity Assessment Lower Extremity Assessment: Generalized weakness    Cervical / Trunk Assessment Cervical / Trunk Assessment: Normal  Communication   Communication: No difficulties  Cognition Arousal/Alertness: Awake/alert Behavior During Therapy: WFL for tasks assessed/performed Overall Cognitive Status: Within Functional Limits for tasks assessed                                          General Comments      Exercises     Assessment/Plan    PT Assessment Patient needs continued PT services  PT Problem List Decreased strength;Decreased mobility;Decreased activity tolerance;Decreased balance       PT Treatment Interventions DME instruction;Therapeutic activities;Gait training;Therapeutic exercise;Patient/family education;Functional mobility training;Balance training    PT Goals (Current goals can be found in the Care Plan section)  Acute Rehab PT Goals Patient Stated Goal: to return home PT Goal Formulation: With patient Time For Goal Achievement: 09/17/21 Potential to Achieve Goals: Good    Frequency Min 2X/week     Co-evaluation               AM-PAC PT "6 Clicks" Mobility  Outcome Measure Help needed turning from your back to your side while in a flat bed without using bedrails?: A Little Help needed moving from lying on your back to sitting on the side of a flat bed without using bedrails?: A Little Help needed moving to and from a bed to a chair (including a wheelchair)?: A Little Help needed standing up from a chair using your arms (e.g., wheelchair or bedside chair)?: A Little Help needed to walk in hospital  room?: A Little Help needed climbing 3-5 steps with a railing? : A Lot 6 Click Score: 17    End of Session Equipment Utilized During Treatment: Gait belt Activity Tolerance: Patient tolerated treatment well Patient left: in bed;with call bell/phone within reach Nurse Communication: Mobility status PT Visit Diagnosis: Unsteadiness on feet (R26.81);Muscle weakness (generalized) (M62.81);Difficulty in walking, not elsewhere classified (R26.2);History of falling (Z91.81)    Time: 1202-1220 PT Time Calculation (min) (ACUTE ONLY): 18 min   Charges:   PT Evaluation $PT Eval Moderate Complexity: 1 Mod PT Treatments $Gait Training: 8-22 mins        Nikko Goldwire, PT, GCS 09/10/21,12:52 PM

## 2021-09-10 NOTE — Progress Notes (Signed)
[facility] Progress Note  Patient: Barbara Frank    NOM:[id number]  DOA: 09/09/2021     Date of Service: the patient was seen and examined on 09/10/2021  Chief Complaint  Patient presents with   Fall   Brief hospital course: Barbara Frank is a 76 y.o. female with medical history significant of  diabetes mellitus, atrial fibrillation on Eliquis, depression, renal cell carcinoma, lung nodule, who presents with fall BIB EMS with complaint of dizziness then fall. Patient on evaluation in ed was found to have afib with rvr to 150's as well as uncontrolled glucose in mid 500's. Patient states that she has been noncompliant with all her medications for the last week. Patient lives in an ALF and has had similar presentation as this 07/23/21. Patient it appears forgets to take her medications. Per patient she was in rehab for 30 days and had recently been discharged 2 weeks ago. She states on discharge she was not given medications. She notes she has some medications at home but ran out and it appears did not seek to refill her medications.  On ros she denies chest pain, sob, f/c/n/v/d /HA/dysuria but does note low appetite and some weight loss,urinary frequency in addition to chronic issue with balance necessitating use of walker. She currently states she feels she is at her baseline.   ED Course: Vitals: Afeb, bp 118/93, hr 151 , r18 sat 98% on ra  Ekg: afib with rvr with RBBB, rate related st-twave changes Patient in ed was given ccb iv x 2,home carvedilol as well as IVFs with improved in her  HR to 100s.  Patient also noted to have + UA  Wbc 7.6, hgb13.3, Bnp 119, AG16 Na 131, bicard 19,glucose 529, Ce 9, Mag 1.6 Cxr :nad, Pelvis xray: nad CT head: 1. No evidence of acute intracranial abnormality. 2. Mild chronic small vessel ischemic changes within the cerebral white matter. 3. Mild generalized cerebral atrophy. 4. Mild paranasal sinus disease, as described. CT cervical spine: 1. No  evidence of acute fracture to the cervical spine. 2. Straightening of the expected cervical lordosis. 3. Cervicothoracic levocurvature. 4. Trace C6-C7 and C7-T1 grade 1 anterolisthesis. 5. Cervical spondylosis, as described. 6. 1.6 cm left thyroid lobe nodule. A nonemergent thyroid ultrasound is recommended for further evaluation.   Assessment and Plan: Afib rvr  -now controlled s/p ccb iv x 2 and restarting rate control medications  -hr now <100 -resume home regimen, cardizem CD2 40 mg p.o. daily, Coreg 6.25 twice daily -As needed IV metoprolol 2.5 mg every 3 hours -Resumed Eliquis 5 mg p.o. twice daily    Uncontrolled DMII, mild DKA ANION GAP 16, CO2 19, lactic acid 2.2, beta hydroxybutyrate 2.92. start on home lantus 27 units daily, and sliding scale with coverage -check a1c  -ivfs     UTI  -Continue IV ctx  -f/u on urine culture    Hypomagnesemia  -repleated   Left thyroids nodule  -will need thyroid u/s as out patient - tsh 1.5 stable WNL   Fall -related to dizziness from afib  -pt/ot eval done, recommended home health and PT  Depression; Continue ssri    RCC Follow swith Yu    Lung nodule, follow with PCP to repeat CT chest after 12 months Followed by oncology   Body mass index is 29.26 kg/m.  Interventions:     Diet: Diabetic diet DVT Prophylaxis: Therapeutic Anticoagulation with Eliquis    Advance goals of care discussion: Full code  Family Communication: family  was not present at bedside, at the time of interview.  The pt provided permission to discuss medical plan with the family. Opportunity was given to ask question and all questions were answered satisfactorily.   Disposition:  Pt is from Home, admitted with mild DKA, A. fib with RVR and possible UTI, still has slightly high sugar, urine culture pending, on IV antibiotics and IV fluids, which precludes a safe discharge. Discharge to home with home PT and OT, when most likely tomorrow  a.m.  Subjective: No significant events overnight, patient seems to be doing well, denied any complaints, no chest pain or palpitation, no shortness of breath, patient denied any abdominal pain, no nausea vomiting or diarrhea.  Physical Exam: General:  alert oriented to time, place, and person.  Appear in no distress, affect appropriate Eyes: PERRLA ENT: Oral Mucosa Clear, moist  Neck: no JVD,  Cardiovascular: S1 and S2 Present, no Murmur,  Respiratory: good respiratory effort, Bilateral Air entry equal and Decreased, no Crackles, no wheezes Abdomen: Bowel Sound present, Soft and no tenderness,  Skin: no rashes Extremities: no Pedal edema, no calf tenderness Neurologic: without any new focal findings Gait not checked due to patient safety concerns  Vitals:   09/10/21 1445 09/10/21 1500 09/10/21 1515 09/10/21 1530  BP:  121/64  117/66  Pulse: 81 84 84 87  Resp: 17 19 17 18   Temp:      TempSrc:      SpO2: 93% (!) 89% 93% 92%  Weight:      Height:        Intake/Output Summary (Last 24 hours) at 09/10/2021 1558 Last data filed at 09/09/2021 1606 Gross per 24 hour  Intake 100.5 ml  Output --  Net 100.5 ml   Filed Weights   09/09/21 1113  Weight: 72.6 kg    Data Reviewed: I have personally reviewed and interpreted daily labs, tele strips, imagings as discussed above. I reviewed all nursing notes, pharmacy notes, vitals, pertinent old records I have discussed plan of care as described above with RN and patient/family.  CBC: Recent Labs  Lab 09/09/21 1137 09/10/21 0500  WBC 7.6 6.6  NEUTROABS 5.5  --   HGB 13.3 12.0  HCT 42.7 38.0  MCV 87.1 86.4  PLT 312 354   Basic Metabolic Panel: Recent Labs  Lab 09/09/21 1137 09/09/21 2002 09/10/21 0500  NA 131* 137 135  K 4.5 3.6 4.5  CL 96* 105 103  CO2 19* 26 24  GLUCOSE 529* 248* 311*  BUN 15 13 14   CREATININE 0.66 0.59 0.61  CALCIUM 10.5* 10.0 9.7  MG 1.6*  --  1.7  PHOS  --   --  2.8    Studies: CT Angio  Chest Pulmonary Embolism (PE) W or WO Contrast  Result Date: 09/09/2021 CLINICAL DATA:  Concern for pulmonary embolism. EXAM: CT ANGIOGRAPHY CHEST WITH CONTRAST TECHNIQUE: Multidetector CT imaging of the chest was performed using the standard protocol during bolus administration of intravenous contrast. Multiplanar CT image reconstructions and MIPs were obtained to evaluate the vascular anatomy. CONTRAST:  78mL OMNIPAQUE IOHEXOL 350 MG/ML SOLN COMPARISON:  CT dated 06/23/2021 and radiograph dated 09/09/2021. FINDINGS: Cardiovascular: Mild cardiomegaly. No pericardial effusion. Coronary vascular calcification. There is mild atherosclerotic calcification of the thoracic aorta. No aneurysmal dilatation. There is dilatation of the main pulmonary trunk suggestive of pulmonary hypertension. Evaluation of the pulmonary arteries is limited due to respiratory motion artifact. No pulmonary artery embolus identified. Mediastinum/Nodes: No hilar or mediastinal adenopathy. The  esophagus is grossly unremarkable. No mediastinal fluid collection. Lungs/Pleura: There is a 5 mm nodule in the right upper lobe (19/6). A cluster of nodularity in the right upper lobe laterally measures up to 13 mm (28/6) and corresponds to the nodule seen on the prior CT. Overall there has been interval decrease in the nodular appearance since the prior CT. This likely represents a post inflammatory process. No new consolidation. There is no pleural effusion pneumothorax. The central airways are patent. Upper Abdomen: Multiple surgical clips with associated streak artifact. Partially visualized right renal mass measuring up to 4 cm. Postsurgical changes of the stomach. Musculoskeletal: Osteopenia degenerative changes of the spine. No acute osseous pathology. Review of the MIP images confirms the above findings. IMPRESSION: 1. No acute intrathoracic pathology. No CT evidence of pulmonary artery embolus. 2. Cluster of nodularity in the right upper lobe  with interval decrease in the nodular appearance since the prior CT. This likely represents a post inflammatory process. 3. A 5 mm right upper lobe pulmonary nodule. No follow-up needed if patient is low-risk. Non-contrast chest CT can be considered in 12 months if patient is high-risk. This recommendation follows the consensus statement: Guidelines for Management of Incidental Pulmonary Nodules Detected on CT Images: From the Fleischner Society 2017; Radiology 2017; 284:228-243. 4. Partially visualized right renal mass. 5. Aortic Atherosclerosis (ICD10-I70.0). Electronically Signed   By: Anner Crete M.D.   On: 09/09/2021 21:07    Scheduled Meds:  [START ON 09/12/2021] apixaban  5 mg Oral BID   carvedilol  6.25 mg Oral BID WC   diltiazem  240 mg Oral Daily   escitalopram  10 mg Oral Daily   insulin aspart  0-15 Units Subcutaneous TID WC   insulin aspart  4 Units Subcutaneous TID WC   insulin glargine-yfgn  27 Units Subcutaneous QHS   loratadine  10 mg Oral Daily   polyethylene glycol  17 g Oral Daily   Continuous Infusions:  cefTRIAXone (ROCEPHIN)  IV Stopped (09/10/21 1046)   PRN Meds: acetaminophen **OR** acetaminophen, acetaminophen, ketorolac  Time spent: 35 minutes  Author: Val Riles. MD [facility] 09/10/2021 3:58 PM  To reach On-call, see care teams to locate the attending and reach out to them via [email or web address]. If 7PM-7AM, please contact night-coverage If you still have difficulty reaching the attending provider, please page the [facility] (Director on Call) for [facility] on amion for assistance.

## 2021-09-10 NOTE — Evaluation (Signed)
Occupational Therapy Evaluation Patient Details Name: Barbara Frank MRN: [id number] DOB: 04-16-1946 Today's Date: 09/10/2021   History of Present Illness Barbara Frank is a 76 y.o. female with medical history significant of   diabetes mellitus, atrial fibrillation on Eliquis, depression, renal cell carcinoma, lung nodule, who presents with fall BIB EMS with complaint of dizziness then fall. Patient on evaluation in ed was found to have afib with rvr to 150's as well as uncontrolled glucose in mid 500's. Patient states that she has been noncompliant with all her medications for the last week. Patient lives in an ALF   Clinical Impression   Patient presenting with decreased Ind in self care, balance, functional mobility/transfers, endurance, and safety awareness. Patient reports living at [facility] and being independent in self care taks with use of RW PTA. Patient currently functioning at min guard with RW but does fatigue quickly. Patient will benefit from acute OT to increase overall independence in the areas of ADLs, functional mobility, and safety awareness in order to safely discharge home.      Recommendations for follow up therapy are one component of a multi-disciplinary discharge planning process, led by the attending physician.  Recommendations may be updated based on patient status, additional functional criteria and insurance authorization.   Follow Up Recommendations  Home health OT    Assistance Recommended at Discharge Intermittent Supervision/Assistance  Patient can return home with the following A little help with walking and/or transfers;A little help with bathing/dressing/bathroom    Functional Status Assessment  Patient has had a recent decline in their functional status and demonstrates the ability to make significant improvements in function in a reasonable and predictable amount of time.  Equipment Recommendations  None recommended by OT       Precautions /  Restrictions Precautions Precautions: Fall Restrictions Weight Bearing Restrictions: No      Mobility Bed Mobility Overal bed mobility: Needs Assistance Bed Mobility: Supine to Sit     Supine to sit: HOB elevated;Min assist     General bed mobility comments: min assist to raise trunk to seated position    Transfers Overall transfer level: Needs assistance Equipment used: Rolling walker (2 wheels) Transfers: Sit to/from Stand Sit to Stand: Min assist;From elevated surface           General transfer comment: posterior lean with initial standing      Balance Overall balance assessment: Needs assistance;History of Falls Sitting-balance support: Feet supported Sitting balance-Leahy Scale: Good     Standing balance support: Bilateral upper extremity supported;During functional activity;Reliant on assistive device for balance Standing balance-Leahy Scale: Fair Standing balance comment: posterior lean with initial standing                           ADL either performed or assessed with clinical judgement   ADL Overall ADL's : Needs assistance/impaired     Grooming: Wash/dry hands;Wash/dry face;Sitting;Set up;Supervision/safety                   Toilet Transfer: Min guard   Toileting- Clothing Manipulation and Hygiene: Min guard       Functional mobility during ADLs: Min guard       Vision Patient Visual Report: No change from baseline              Pertinent Vitals/Pain Pain Assessment: No/denies pain Faces Pain Scale: Hurts a little bit Pain Location: bottom from falling Pain Descriptors / Indicators: Discomfort;Sore Pain  Intervention(s): Monitored during session     Hand Dominance Right   Extremity/Trunk Assessment Upper Extremity Assessment Upper Extremity Assessment: Generalized weakness   Lower Extremity Assessment Lower Extremity Assessment: Generalized weakness   Cervical / Trunk Assessment Cervical / Trunk  Assessment: Normal   Communication Communication Communication: No difficulties   Cognition Arousal/Alertness: Awake/alert Behavior During Therapy: WFL for tasks assessed/performed Overall Cognitive Status: Within Functional Limits for tasks assessed                                                  Home Living Family/patient expects to be discharged to:: Assisted living                             Home Equipment: Conservation officer, nature (2 wheels)   Additional Comments: Patient reports she lives at [facility]      Prior Functioning/Environment Prior Level of Function : Independent/Modified Independent             Mobility Comments: Pt reported using RW for mobility ADLs Comments: Pt reports Ind for walk in shower and dressing. She ambulates to community dining room for meals.        OT Problem List: Decreased strength;Decreased activity tolerance;Impaired balance (sitting and/or standing);Decreased safety awareness;Decreased knowledge of use of DME or AE      OT Treatment/Interventions: Self-care/ADL training;Therapeutic exercise;Therapeutic activities;Cognitive remediation/compensation;DME and/or AE instruction;Manual therapy;Balance training;Patient/family education    OT Goals(Current goals can be found in the care plan section) Acute Rehab OT Goals Patient Stated Goal: to feel better and go home OT Goal Formulation: With patient Time For Goal Achievement: 09/24/21 Potential to Achieve Goals: Good ADL Goals Pt Will Perform Grooming: with modified independence;standing Pt Will Perform Lower Body Dressing: with modified independence;sit to/from stand Pt Will Transfer to Toilet: with modified independence;ambulating Pt Will Perform Toileting - Clothing Manipulation and hygiene: with modified independence;sit to/from stand  OT Frequency: Min 2X/week       AM-PAC OT "6 Clicks" Daily Activity     Outcome Measure Help from another person  eating meals?: None Help from another person taking care of personal grooming?: A Little Help from another person toileting, which includes using toliet, bedpan, or urinal?: A Little Help from another person bathing (including washing, rinsing, drying)?: A Little Help from another person to put on and taking off regular upper body clothing?: None Help from another person to put on and taking off regular lower body clothing?: A Little 6 Click Score: 20   End of Session Equipment Utilized During Treatment: Rolling walker (2 wheels) Nurse Communication: Mobility status  Activity Tolerance: Patient tolerated treatment well Patient left: in bed;with call bell/phone within reach;with bed alarm set  OT Visit Diagnosis: Unsteadiness on feet (R26.81);Repeated falls (R29.6);Muscle weakness (generalized) (M62.81)                Time: 1343-1400 OT Time Calculation (min): 17 min Charges:  OT General Charges $OT Visit: 1 Visit OT Evaluation $OT Eval Moderate Complexity: 1 Mod OT Treatments $Self Care/Home Management : 8-22 mins  Darleen Crocker, MS, OTR/L , CBIS ascom [phone]  09/10/21, 3:05 PM

## 2021-09-11 DIAGNOSIS — I4811 Longstanding persistent atrial fibrillation: Secondary | ICD-10-CM | POA: Diagnosis not present

## 2021-09-11 LAB — CBC
HCT: 40.6 % (ref 36.0–46.0)
Hemoglobin: 12.9 g/dL (ref 12.0–15.0)
MCH: 27.1 pg (ref 26.0–34.0)
MCHC: 31.8 g/dL (ref 30.0–36.0)
MCV: 85.3 fL (ref 80.0–100.0)
Platelets: 245 10*3/uL (ref 150–400)
RBC: 4.76 MIL/uL (ref 3.87–5.11)
RDW: 15.1 % (ref 11.5–15.5)
WBC: 5.8 10*3/uL (ref 4.0–10.5)
nRBC: 0 % (ref 0.0–0.2)

## 2021-09-11 LAB — BASIC METABOLIC PANEL
Anion gap: 6 (ref 5–15)
BUN: 13 mg/dL (ref 8–23)
CO2: 24 mmol/L (ref 22–32)
Calcium: 9.8 mg/dL (ref 8.9–10.3)
Chloride: 104 mmol/L (ref 98–111)
Creatinine, Ser: 0.39 mg/dL — ABNORMAL LOW (ref 0.44–1.00)
GFR, Estimated: >60 mL/min (ref >60)
Glucose, Bld: 235 mg/dL — ABNORMAL HIGH (ref 70–99)
Potassium: 4 mmol/L (ref 3.5–5.1)
Sodium: 134 mmol/L — ABNORMAL LOW (ref 135–145)

## 2021-09-11 LAB — GLUCOSE, CAPILLARY
Glucose-Capillary: 323 mg/dL — ABNORMAL HIGH (ref 70–99)
Glucose-Capillary: 189 mg/dL — ABNORMAL HIGH (ref 70–99)
Glucose-Capillary: 166 mg/dL — ABNORMAL HIGH (ref 70–99)

## 2021-09-11 LAB — CBG MONITORING, ED: Glucose-Capillary: 211 mg/dL — ABNORMAL HIGH (ref 70–99)

## 2021-09-11 LAB — PHOSPHORUS: Phosphorus: 2.6 mg/dL (ref 2.5–4.6)

## 2021-09-11 LAB — MAGNESIUM: Magnesium: 1.5 mg/dL — ABNORMAL LOW (ref 1.7–2.4)

## 2021-09-11 MED ORDER — ACETAMINOPHEN 325 MG PO TABS: 650.0000 mg | ORAL_TABLET | Freq: Four times a day (QID) | ORAL | 1 refills | Status: AC | PRN

## 2021-09-11 MED ORDER — INSULIN ASPART 100 UNIT/ML IJ SOLN: 4.0000 [IU] | Freq: Three times a day (TID) | INTRAMUSCULAR | 0 refills | Status: DC

## 2021-09-11 MED ORDER — GLUCERNA SHAKE PO LIQD
237.0000 mL | Freq: Two times a day (BID) | ORAL | Status: DC
  Administered 2021-09-11 – 2021-09-14 (×5): 237 mL via ORAL

## 2021-09-11 MED ORDER — GLUCERNA SHAKE PO LIQD: 237.0000 mL | Freq: Three times a day (TID) | ORAL | 2 refills | Status: AC

## 2021-09-11 MED ORDER — ADULT MULTIVITAMIN W/MINERALS CH
1.0000 | ORAL_TABLET | Freq: Every day | ORAL | Status: DC
  Administered 2021-09-11 – 2021-09-14 (×4): 1 via ORAL
  Filled 2021-09-11 (×4): qty 1

## 2021-09-11 MED ORDER — METFORMIN HCL ER 500 MG PO TB24: 500.0000 mg | ORAL_TABLET | Freq: Two times a day (BID) | ORAL | 2 refills | Status: AC

## 2021-09-11 MED ORDER — CETIRIZINE HCL 10 MG PO TABS: 5.0000 mg | ORAL_TABLET | Freq: Every day | ORAL | 0 refills | Status: DC

## 2021-09-11 MED ORDER — CARVEDILOL 6.25 MG PO TABS: 6.2500 mg | ORAL_TABLET | Freq: Two times a day (BID) | ORAL | 2 refills | Status: DC

## 2021-09-11 MED ORDER — INSULIN GLARGINE-YFGN 100 UNIT/ML ~~LOC~~ SOLN
27.0000 [IU] | Freq: Two times a day (BID) | SUBCUTANEOUS | Status: DC
  Administered 2021-09-11 – 2021-09-14 (×6): 27 [IU] via SUBCUTANEOUS
  Filled 2021-09-11 (×7): qty 0.27

## 2021-09-11 MED ORDER — ENSURE MAX PROTEIN PO LIQD
11.0000 [oz_av] | Freq: Every day | ORAL | Status: DC
  Administered 2021-09-11: 11 [oz_av] via ORAL
  Filled 2021-09-11: qty 330

## 2021-09-11 MED ORDER — INSULIN GLARGINE 100 UNIT/ML ~~LOC~~ SOLN: 27.0000 [IU] | Freq: Two times a day (BID) | SUBCUTANEOUS | 11 refills | Status: DC

## 2021-09-11 MED ORDER — ADULT MULTIVITAMIN W/MINERALS CH: 1.0000 | ORAL_TABLET | Freq: Every day | ORAL | 2 refills | Status: AC

## 2021-09-11 MED ORDER — POLYETHYLENE GLYCOL 3350 17 G PO PACK: 17.0000 g | PACK | Freq: Every day | ORAL | 0 refills | Status: AC

## 2021-09-11 MED ORDER — DILTIAZEM HCL ER COATED BEADS 240 MG PO CP24: 240.0000 mg | ORAL_CAPSULE | Freq: Every evening | ORAL | 2 refills | Status: AC

## 2021-09-11 MED ORDER — INSULIN ASPART 100 UNIT/ML IJ SOLN: 0.0000 [IU] | Freq: Three times a day (TID) | INTRAMUSCULAR | 11 refills | Status: DC

## 2021-09-11 MED ORDER — APIXABAN 5 MG PO TABS: 5.0000 mg | ORAL_TABLET | Freq: Two times a day (BID) | ORAL | 2 refills | Status: DC

## 2021-09-11 MED ORDER — MAGNESIUM SULFATE 4 GM/100ML IV SOLN
4.0000 g | Freq: Once | INTRAVENOUS | Status: AC
  Administered 2021-09-11: 4 g via INTRAVENOUS
  Filled 2021-09-11: qty 100

## 2021-09-11 MED ORDER — ESCITALOPRAM OXALATE 5 MG PO TABS: 5.0000 mg | ORAL_TABLET | Freq: Every day | ORAL | 0 refills | Status: DC

## 2021-09-11 NOTE — Progress Notes (Signed)
Initial Nutrition Assessment  DOCUMENTATION CODES:   Not applicable  INTERVENTION:   -Glucerna Shake po BID, each supplement provides 220 kcal and 10 grams of protein  -Ensure Max po daily, each supplement provides 150 kcal and 30 grams of protein -MVI with minerals daily  NUTRITION DIAGNOSIS:   Inadequate oral intake related to decreased appetite as evidenced by per patient/family report.  GOAL:   Patient will meet greater than or equal to 90% of their needs  MONITOR:   PO intake, Supplement acceptance, Diet advancement, Labs, Weight trends, Skin, I & O's  REASON FOR ASSESSMENT:   Malnutrition Screening Tool    ASSESSMENT:   Barbara Frank is a 76 y.o. female with medical history significant of   diabetes mellitus, atrial fibrillation on Eliquis, depression, renal cell carcinoma, lung nodule, who presents with fall BIB EMS with complaint of dizziness then fall. Patient on evaluation in ed was found to have afib with rvr to 150's as well as uncontrolled glucose in mid 500's. Patient states that she has been noncompliant with all her medications for the last week. Patient lives in an ALF and has had similar presentation as this 07/23/21. Patient it appears forgets to take her medications. Per patient she was in rehab for 30 days and had recently been discharged 2 weeks ago. She states on discharge she was not given medications. She notes she has some medications at home but ran out and it appears did not seek to refill her medications.  On ros she denies chest pain, sob, f/c/n/v/d /HA/dysuria but does note low appetite and some weight loss,urinary frequency in addition to chronic issue with balance necessitating use of walker. She currently states she feels she is at her baseline.  Pt admitted with a-fib with RVR.   Spoke with pt at bedside, who complains of being cold. RD adjusted thermostat and provided pt with warm blanket. Pt shares that she has had a decreased appetite over  the past year, due to grief of her friends passing away and poor food quality at facility. Intake is often intermittent; pt explains that sometimes she enjoys her meals, but other times the food is not good. She also reports that she often skips breakfast as they forget to being her meal to her.   Pt reports that she consumed about 50% of her breakfast; she tries to take a few bites of each item she receives.   Per pt, her UBW is around 180# and suspects she has lost 15-20# within the past year. Reviewed wt hx; pt has experienced a 13.2% wt loss over the past 6 months, which is significant for time frame. Pt also complains of weakness in her legs and shares her therapy sessions have been inconsistent as pt has been to the hospital several times this year.   Per MD notes, pt has been non-compliant with her medications over the past week.   Discussed importance of good meal and supplement intake to promote healing.   Medications reviewed and include cardizem.   Lab Results  Component Value Date   HGBA1C 11.1 (H) 09/10/2021   PTA DM medications are 500 mg metformin BID, 4 units insulin aspart TID with meals and 27 units insulin glargine BID.   Labs reviewed: Na: 134, CBGS: 167-284 (inpatient orders for glycemic control are 0-15 units insulin aspart TID with meals, 4 units inuslin aspart TID with meals, and 27 units inuslin glargine-yfgn daily).    Diet Order:   Diet Order  Diet heart healthy/carb modified Room service appropriate? Yes; Fluid consistency: Thin  Diet effective now                   EDUCATION NEEDS:   Education needs have been addressed  Skin:  Skin Assessment: Reviewed RN Assessment  Last BM:  Unknown  Height:   Ht Readings from Last 1 Encounters:  09/09/21 5\' 2"  (1.575 m)    Weight:   Wt Readings from Last 1 Encounters:  09/09/21 72.6 kg    Ideal Body Weight:  50 kg  BMI:  Body mass index is 29.26 kg/m.  Estimated Nutritional Needs:    Kcal:  9326-7124  Protein:  100-115 grams  Fluid:  > 1.7 L    Loistine Chance, RD, LDN, [place] Registered Dietitian II Certified Diabetes Care and Education Specialist Please refer to [facility] for RD and/or RD on-call/weekend/after hours pager

## 2021-09-11 NOTE — Progress Notes (Signed)
Occupational Therapy Treatment Patient Details Name: Barbara Frank MRN: [id number] DOB: 05-20-1946 Today's Date: 09/11/2021   History of present illness Barbara Frank is a 76 y.o. female with medical history significant of   diabetes mellitus, atrial fibrillation on Eliquis, depression, renal cell carcinoma, lung nodule, who presents with fall BIB EMS with complaint of dizziness then fall. Patient on evaluation in ed was found to have afib with rvr to 150's as well as uncontrolled glucose in mid 500's. Patient states that she has been noncompliant with all her medications for the last week. Patient lives in an ALF   OT comments  Upon entering the room, pt supine in bed with no c/o pain and is agreeable to OT intervention. Pt performing supine >sit with supervision to EOB. Min guard to stand with RW and pt ambulating into bathroom for toileting needs. Pt able to void and performs hygiene while seated without assistance. Pt needing min guard for balance during LB clothing management. Pt stands at sink for hand hygiene with close supervision and then returns to bed. She declines purewick and OT reviews where call bell located and to call staff for toileting needs. Pt is agreeable. Sit >supine with supervision and all needs within reach. Pt continues to benefit from OT intervention.     Recommendations for follow up therapy are one component of a multi-disciplinary discharge planning process, led by the attending physician.  Recommendations may be updated based on patient status, additional functional criteria and insurance authorization.    Follow Up Recommendations  Home health OT    Assistance Recommended at Discharge Intermittent Supervision/Assistance  Patient can return home with the following  A little help with walking and/or transfers;A little help with bathing/dressing/bathroom;Assistance with cooking/housework;Assist for transportation;Direct supervision/assist for medications  management;Direct supervision/assist for financial management   Equipment Recommendations  None recommended by OT       Precautions / Restrictions Precautions Precautions: Fall       Mobility Bed Mobility Overal bed mobility: Needs Assistance Bed Mobility: Supine to Sit;Sit to Supine     Supine to sit: Supervision Sit to supine: Supervision   General bed mobility comments: no physical assistance provided but increased time needed for task    Transfers Overall transfer level: Needs assistance Equipment used: Rolling walker (2 wheels) Transfers: Sit to/from Stand Sit to Stand: Min guard                 Balance Overall balance assessment: Needs assistance;History of Falls Sitting-balance support: Feet supported Sitting balance-Leahy Scale: Good     Standing balance support: Bilateral upper extremity supported;During functional activity;Reliant on assistive device for balance Standing balance-Leahy Scale: Fair                             ADL either performed or assessed with clinical judgement   ADL Overall ADL's : Needs assistance/impaired     Grooming: Wash/dry hands;Standing;Min guard                   Toilet Transfer: Min guard;Rolling walker (2 wheels);Regular Toilet;Ambulation   Toileting- Clothing Manipulation and Hygiene: Min guard;Sit to/from stand       Functional mobility during ADLs: Min guard;Rolling walker (2 wheels)      Extremity/Trunk Assessment Upper Extremity Assessment Upper Extremity Assessment: Generalized weakness   Lower Extremity Assessment Lower Extremity Assessment: Generalized weakness        Vision Patient Visual Report: No change from baseline  Cognition Arousal/Alertness: Awake/alert Behavior During Therapy: WFL for tasks assessed/performed Overall Cognitive Status: Within Functional Limits for tasks assessed                                                        Pertinent Vitals/ Pain       Pain Assessment: No/denies pain         Frequency  Min 2X/week        Progress Toward Goals  OT Goals(current goals can now be found in the care plan section)  Progress towards OT goals: Progressing toward goals  Acute Rehab OT Goals Patient Stated Goal: to go home OT Goal Formulation: With patient Time For Goal Achievement: 09/24/21 Potential to Achieve Goals: Good  Plan Discharge plan remains appropriate;Frequency remains appropriate       AM-PAC OT "6 Clicks" Daily Activity     Outcome Measure   Help from another person eating meals?: None Help from another person taking care of personal grooming?: A Little Help from another person toileting, which includes using toliet, bedpan, or urinal?: A Little Help from another person bathing (including washing, rinsing, drying)?: A Little Help from another person to put on and taking off regular upper body clothing?: None Help from another person to put on and taking off regular lower body clothing?: A Little 6 Click Score: 20    End of Session Equipment Utilized During Treatment: Rolling walker (2 wheels)  OT Visit Diagnosis: Unsteadiness on feet (R26.81);Repeated falls (R29.6);Muscle weakness (generalized) (M62.81)   Activity Tolerance Patient tolerated treatment well   Patient Left in bed;with call bell/phone within reach;with bed alarm set   Nurse Communication Mobility status;Other (comment) (purewick removed)        Time: 3846-6599 OT Time Calculation (min): 24 min  Charges: OT General Charges $OT Visit: 1 Visit OT Treatments $Self Care/Home Management : 23-37 mins  Darleen Crocker, MS, OTR/L , CBIS ascom [phone]  09/11/21, 12:26 PM

## 2021-09-11 NOTE — Progress Notes (Signed)
[facility] Progress Note  Patient: Barbara Frank    YBW:[id number]  DOA: 09/09/2021     Date of Service: the patient was seen and examined on 09/11/2021  Chief Complaint  Patient presents with   Fall   Brief hospital course: Barbara Frank is a 76 y.o. female with medical history significant of  diabetes mellitus, atrial fibrillation on Eliquis, depression, renal cell carcinoma, lung nodule, who presents with fall BIB EMS with complaint of dizziness then fall. Patient on evaluation in ed was found to have afib with rvr to 150's as well as uncontrolled glucose in mid 500's. Patient states that she has been noncompliant with all her medications for the last week. Patient lives in an ALF and has had similar presentation as this 07/23/21. Patient it appears forgets to take her medications. Per patient she was in rehab for 30 days and had recently been discharged 2 weeks ago. She states on discharge she was not given medications. She notes she has some medications at home but ran out and it appears did not seek to refill her medications.  On ros she denies chest pain, sob, f/c/n/v/d /HA/dysuria but does note low appetite and some weight loss,urinary frequency in addition to chronic issue with balance necessitating use of walker. She currently states she feels she is at her baseline.   ED Course: Vitals: Afeb, bp 118/93, hr 151 , r18 sat 98% on ra  Ekg: afib with rvr with RBBB, rate related st-twave changes Patient in ed was given ccb iv x 2,home carvedilol as well as IVFs with improved in her  HR to 100s.  Patient also noted to have + UA  Wbc 7.6, hgb13.3, Bnp 119, AG16 Na 131, bicard 19,glucose 529, Ce 9, Mag 1.6 Cxr :nad, Pelvis xray: nad CT head: 1. No evidence of acute intracranial abnormality. 2. Mild chronic small vessel ischemic changes within the cerebral white matter. 3. Mild generalized cerebral atrophy. 4. Mild paranasal sinus disease, as described. CT cervical spine: 1. No  evidence of acute fracture to the cervical spine. 2. Straightening of the expected cervical lordosis. 3. Cervicothoracic levocurvature. 4. Trace C6-C7 and C7-T1 grade 1 anterolisthesis. 5. Cervical spondylosis, as described. 6. 1.6 cm left thyroid lobe nodule. A nonemergent thyroid ultrasound is recommended for further evaluation.   Assessment and Plan: Afib rvr  -now controlled s/p ccb iv x 2 and restarting rate control medications  -hr now <100 -resume home regimen, cardizem CD2 40 mg p.o. daily, Coreg 6.25 twice daily -As needed IV metoprolol 2.5 mg every 3 hours -Resumed Eliquis 5 mg p.o. twice daily    Uncontrolled DMII, mild DKA ANION GAP 16, CO2 19, lactic acid 2.2, beta hydroxybutyrate 2.92. start on home lantus 27 units daily, and sliding scale with coverage -a1c 11.1 uncontrolled diabetes -ivfs     UTI  -Continue IV ctx  -f/u on urine culture    Hypomagnesemia  -repleated   Left thyroids nodule  -will need thyroid u/s as out patient - tsh 1.5 stable WNL   Fall -related to dizziness from afib  -pt/ot eval done, recommended home health and PT  Depression; Continue ssri    RCC Follow swith Yu    Lung nodule, follow with PCP to repeat CT chest after 12 months Followed by oncology   Body mass index is 29.26 kg/m.  Interventions:     Diet: Diabetic diet DVT Prophylaxis: Therapeutic Anticoagulation with Eliquis    Advance goals of care discussion: Full code  Family Communication:  family was not present at bedside, at the time of interview.  The pt provided permission to discuss medical plan with the family. Opportunity was given to ask question and all questions were answered satisfactorily.   Disposition:  Pt is from Home, admitted with mild DKA, A. fib with RVR and possible UTI, still has slightly high sugar, urine culture pending, on IV antibiotics and IV fluids, which precludes a safe discharge. Discharge to ALF with home PT and OT, when most likely  tomorrow a.m. [facility] consulted as patient will need help with all the medications, home health agency will be arranged most likely tomorrow a.m.   Subjective: No significant events overnight, patient seems to be doing well, denied any complaints, no chest pain or palpitation, no shortness of breath, patient denied any abdominal pain, no nausea vomiting or diarrhea.  Physical Exam: General:  alert oriented to time, place, and person.  Appear in no distress, affect appropriate Eyes: PERRLA ENT: Oral Mucosa Clear, moist  Neck: no JVD,  Cardiovascular: S1 and S2 Present, no Murmur,  Respiratory: good respiratory effort, Bilateral Air entry equal and Decreased, no Crackles, no wheezes Abdomen: Bowel Sound present, Soft and no tenderness,  Skin: no rashes Extremities: no Pedal edema, no calf tenderness Neurologic: without any new focal findings Gait not checked due to patient safety concerns  Vitals:   09/11/21 0800 09/11/21 0953 09/11/21 1236 09/11/21 1629  BP: (!) 148/79 (!) 144/76 (!) 145/76 123/62  Pulse: 98 98 94 79  Resp: 15 16 16 16   Temp:  98 F (36.7 C) 99.2 F (37.3 C) 98 F (36.7 C)  TempSrc:  Oral Oral Oral  SpO2: 100% 98% 99% 95%  Weight:      Height:        Intake/Output Summary (Last 24 hours) at 09/11/2021 1727 Last data filed at 09/11/2021 1340 Gross per 24 hour  Intake 240 ml  Output --  Net 240 ml   Filed Weights   09/09/21 1113  Weight: 72.6 kg    Data Reviewed: I have personally reviewed and interpreted daily labs, tele strips, imagings as discussed above. I reviewed all nursing notes, pharmacy notes, vitals, pertinent old records I have discussed plan of care as described above with RN and patient/family.  CBC: Recent Labs  Lab 09/09/21 1137 09/10/21 0500 09/11/21 0646  WBC 7.6 6.6 5.8  NEUTROABS 5.5  --   --   HGB 13.3 12.0 12.9  HCT 42.7 38.0 40.6  MCV 87.1 86.4 85.3  PLT 312 268 742   Basic Metabolic Panel: Recent Labs  Lab 09/09/21 1137  09/09/21 2002 09/10/21 0500 09/11/21 0646  NA 131* 137 135 134*  K 4.5 3.6 4.5 4.0  CL 96* 105 103 104  CO2 19* 26 24 24   GLUCOSE 529* 248* 311* 235*  BUN 15 13 14 13   CREATININE 0.66 0.59 0.61 0.39*  CALCIUM 10.5* 10.0 9.7 9.8  MG 1.6*  --  1.7 1.5*  PHOS  --   --  2.8 2.6    Studies: No results found.  Scheduled Meds:  [START ON 09/12/2021] apixaban  5 mg Oral BID   carvedilol  6.25 mg Oral BID WC   diltiazem  240 mg Oral Daily   escitalopram  10 mg Oral Daily   feeding supplement (GLUCERNA SHAKE)  237 mL Oral BID BM   insulin aspart  0-15 Units Subcutaneous TID WC   insulin aspart  4 Units Subcutaneous TID WC   insulin glargine-yfgn  27 Units Subcutaneous BID   loratadine  10 mg Oral Daily   multivitamin with minerals  1 tablet Oral Daily   polyethylene glycol  17 g Oral Daily   Ensure Max Protein  11 oz Oral Daily   Continuous Infusions:  cefTRIAXone (ROCEPHIN)  IV Stopped (09/11/21 0924)   magnesium sulfate bolus IVPB 4 g (09/11/21 1614)   PRN Meds: acetaminophen **OR** acetaminophen, acetaminophen, ketorolac  Time spent: 35 minutes  Author: Val Riles. MD [facility] 09/11/2021 5:27 PM  To reach On-call, see care teams to locate the attending and reach out to them via [email or web address]. If 7PM-7AM, please contact night-coverage If you still have difficulty reaching the attending provider, please page the [facility] (Director on Call) for [facility] on amion for assistance.

## 2021-09-11 NOTE — ED Notes (Signed)
Kara RN aware of assigned bed 

## 2021-09-11 NOTE — Progress Notes (Signed)
Inpatient Diabetes Program Recommendations  AACE/ADA: New Consensus Statement on Inpatient Glycemic Control   Target Ranges:  Prepandial:   less than 140 mg/dL      Peak postprandial:   less than 180 mg/dL (1-2 hours)      Critically ill patients:  140 - 180 mg/dL    Latest Reference Range & Units 09/10/21 07:34 09/10/21 11:55 09/10/21 16:40 09/10/21 23:12 09/11/21 07:45  Glucose-Capillary 70 - 99 mg/dL 271 (H) 238 (H) 167 (H) 176 (H) 211 (H)   Review of Glycemic Control  Diabetes history: DM2 Outpatient Diabetes medications: Lantus 27 units BID, Metformin XR 500 mg BID, Novolog 4 units TID with meals (per home med list, not taking Novolog) Current orders for Inpatient glycemic control: Semglee 27 units QHS, Novolog 4 units TID with meals, Novolog 0-15 units TID with meals  Inpatient Diabetes Program Recommendations:    Insulin: Please consider increasing Semglee to 27 units BID.  Thanks, Barnie Alderman, RN, MSN, CDE Diabetes Coordinator Inpatient Diabetes Program [phone] (Team Pager from 8am to 5pm)

## 2021-09-11 NOTE — TOC Initial Note (Addendum)
Transition of Care [facility]) - Initial/Assessment Note    Patient Details  Name: Fredia Chittenden MRN: [id number] Date of Birth: 1946-08-13  Transition of [facility] CM/SW Contact:    Alberteen Sam, LCSW Phone Number: 09/11/2021, 4:25 PM  Clinical Narrative:                  CSW notes patient lives at [facility] ALF, per MD there are concerns with patient's confusion in taking medications timely and correct medications. Unsafe to discharge until this is resolved.   CSW has discussed this with Philippa Sicks who reports patient previously refused life at home non medical home services at [facility] ot help her with meds/insulin.. She reports they also had their doctors making house calls discuss the need for assistance in taking meds with her however she continued to refuse assistance.   Currently Rollene Fare is following up with [facility] to see if they can provide home health services and med assistance at discharge. Rollene Fare has been given weekend social worker's information.   Should patient be safe to discharge over the weekend please contact Philippa Sicks at [phone].  MD aware that  [facility] [place] [facility] [place].   Expected Discharge Plan: Assisted Living Barriers to Discharge: Continued Medical Work up   Patient Goals and CMS Choice Patient states their goals for this hospitalization and ongoing recovery are:: to go home CMS Medicare.gov Compare Post Acute Care list provided to:: Patient Choice offered to / list presented to : Patient  Expected Discharge Plan and Services Expected Discharge Plan: Assisted Living       Living arrangements for the past 2 months:  [facility] ALF)                                      Prior Living Arrangements/Services Living arrangements for the past 2 months:  [facility]F) Lives with:: Facility Resident                   Activities of Daily Living Home Assistive  Devices/Equipment: Eyeglasses ADL Screening (condition at time of admission) Patient's cognitive ability adequate to safely complete daily activities?: Yes Is the patient deaf or have difficulty hearing?: No Does the patient have difficulty seeing, even when wearing glasses/contacts?: No Does the patient have difficulty concentrating, remembering, or making decisions?: Yes Patient able to express need for assistance with ADLs?: Yes Does the patient have difficulty dressing or bathing?: Yes Independently performs ADLs?: No Communication: Independent Dressing (OT): Needs assistance Grooming: Needs assistance Feeding: Independent Bathing: Needs assistance Toileting: Needs assistance Does the patient have difficulty walking or climbing stairs?: Yes Weakness of Legs: None Weakness of Arms/Hands: None  Permission Sought/Granted                  Emotional Assessment              Admission diagnosis:  Dehydration [E86.0] A-fib ([place]) [I48.91] Fall [W19.XXXA] Hyperglycemia [R73.9] Atrial fibrillation with rapid ventricular response (HCC) [I48.91] Patient Active Problem List   Diagnosis Date Noted   A-fib ([place]) 09/09/2021   Constipation    Generalized weakness    Fall at home, initial encounter 07/23/2021   Lung nodule 07/23/2021   Elevated lactic acid level 07/23/2021   Prolonged QT interval 07/23/2021   Atrial fibrillation, chronic (HCC)    Weakness    Palliative  care encounter    Acute cystitis with hematuria    Type 2 diabetes mellitus without complication, with long-term current use of insulin (HCC)    Increased ammonia level    Encephalopathy 03/07/2021   Transient confusion 03/06/2021   Intertrigo 03/06/2021   Encounter for antineoplastic chemotherapy 01/08/2021   Encounter for antineoplastic immunotherapy 01/08/2021   Goals of care, counseling/discussion 10/17/2020   Hypercalcemia 10/17/2020   Chest mass 10/17/2020   Atrial fibrillation with RVR ([place])  09/26/2020   Depression 09/26/2020   Diabetes type 2, uncontrolled 09/26/2020   Hypokalemia 09/26/2020   Renal cell carcinoma ([place]) 09/26/2020   Acute delirium 09/26/2020   Ureteral stone 09/26/2020   PCP:  Sheral A[facility]y [facility]:   [place], [place] - [address] [place] [address] Phone: [phone] Fax: [phone]  [place] [address] Phone: [phone] Fax: [phone]     Social Determinants of Health (SDOH) Interventions    Readmission Risk Interventions No flowsheet data found.

## 2021-09-12 DIAGNOSIS — I4811 Longstanding persistent atrial fibrillation: Secondary | ICD-10-CM | POA: Diagnosis not present

## 2021-09-12 LAB — CBC
HCT: 40.8 % (ref 36.0–46.0)
Hemoglobin: 12.9 g/dL (ref 12.0–15.0)
MCH: 26.8 pg (ref 26.0–34.0)
MCHC: 31.6 g/dL (ref 30.0–36.0)
MCV: 84.8 fL (ref 80.0–100.0)
Platelets: 251 10*3/uL (ref 150–400)
RBC: 4.81 MIL/uL (ref 3.87–5.11)
RDW: 15.2 % (ref 11.5–15.5)
WBC: 6.8 10*3/uL (ref 4.0–10.5)
nRBC: 0 % (ref 0.0–0.2)

## 2021-09-12 LAB — BASIC METABOLIC PANEL
Anion gap: 10 (ref 5–15)
BUN: 15 mg/dL (ref 8–23)
CO2: 24 mmol/L (ref 22–32)
Calcium: 10 mg/dL (ref 8.9–10.3)
Chloride: 101 mmol/L (ref 98–111)
Creatinine, Ser: 0.48 mg/dL (ref 0.44–1.00)
GFR, Estimated: >60 mL/min (ref >60)
Glucose, Bld: 327 mg/dL — ABNORMAL HIGH (ref 70–99)
Potassium: 4.5 mmol/L (ref 3.5–5.1)
Sodium: 135 mmol/L (ref 135–145)

## 2021-09-12 LAB — GLUCOSE, CAPILLARY
Glucose-Capillary: 330 mg/dL — ABNORMAL HIGH (ref 70–99)
Glucose-Capillary: 219 mg/dL — ABNORMAL HIGH (ref 70–99)
Glucose-Capillary: 165 mg/dL — ABNORMAL HIGH (ref 70–99)

## 2021-09-12 LAB — MAGNESIUM: Magnesium: 1.8 mg/dL (ref 1.7–2.4)

## 2021-09-12 LAB — PHOSPHORUS: Phosphorus: 3.3 mg/dL (ref 2.5–4.6)

## 2021-09-12 MED ORDER — MAGNESIUM OXIDE -MG SUPPLEMENT 400 (240 MG) MG PO TABS
400.0000 mg | ORAL_TABLET | Freq: Two times a day (BID) | ORAL | Status: DC
  Administered 2021-09-12 – 2021-09-14 (×5): 400 mg via ORAL
  Filled 2021-09-12 (×5): qty 1

## 2021-09-12 MED ORDER — CEFDINIR 300 MG PO CAPS: 300.0000 mg | ORAL_CAPSULE | Freq: Two times a day (BID) | ORAL | 0 refills | Status: DC

## 2021-09-12 NOTE — Plan of Care (Signed)
  Problem: Clinical Measurements: Goal: Ability to maintain clinical measurements within normal limits will improve Outcome: Progressing   Problem: Clinical Measurements: Goal: Respiratory complications will improve Outcome: Progressing   Problem: Clinical Measurements: Goal: Cardiovascular complication will be avoided Outcome: Progressing   Problem: Pain Managment: Goal: General experience of comfort will improve Outcome: Progressing   Problem: Safety: Goal: Ability to remain free from injury will improve Outcome: Progressing   

## 2021-09-12 NOTE — Discharge Summary (Addendum)
[facility] Discharge Summary   Patient: Barbara Frank INO:[id number]  PCP: Sheral Apley  Date of admission: 09/09/2021   Date of discharge:  09/14/2021     Discharge Diagnoses:  Principal Problem:   A-fib [facility])   Admitted From: ALF Disposition:  ALF/ILF with HHPT  Recommendations for Outpatient Follow-up:  PCP: in 1 wk Follow-up with oncologist as an outpatient Follow up LABS/TEST: Thyroid ultrasound   Diet recommendation: Cardiac diet  Activity: The patient is advised to gradually reintroduce usual activities, as tolerated  Discharge Condition: stable  Code Status: Full code   History of present illness: As per the H and P dictated on admission Hospital Course:  Barbara Frank is a 76 y.o. female with medical history significant of  diabetes mellitus, atrial fibrillation on Eliquis, depression, renal cell carcinoma, lung nodule, who presents with fall BIB EMS with complaint of dizziness then fall. Patient on evaluation in ed was found to have afib with rvr to 150's as well as uncontrolled glucose in mid 500's. Patient states that she has been noncompliant with all her medications for the last week. Patient lives in an ALF and has had similar presentation as this 07/23/21. Patient it appears forgets to take her medications. Per patient she was in rehab for 30 days and had recently been discharged 2 weeks ago. She states on discharge she was not given medications. She notes she has some medications at home but ran out and it appears did not seek to refill her medications.  On ros she denies chest pain, sob, f/c/n/v/d /HA/dysuria but does note low appetite and some weight loss,urinary frequency in addition to chronic issue with balance necessitating use of walker. She currently states she feels she is at her baseline.  ED Course: Vitals: Afeb, bp 118/93, hr 151 , r18 sat 98% on ra  Ekg: afib with rvr with RBBB, rate related st-twave changes Patient in ed was given ccb iv  x 2,home carvedilol as well as IVFs with improved in her  HR to 100s.  Patient also noted to have + UA  Wbc 7.6, hgb13.3, Bnp 119, AG16 Na 131, bicard 19,glucose 529, Ce 9, Mag 1.6 Cxr :nad, Pelvis xray: nad CT head: 1. No evidence of acute intracranial abnormality. 2. Mild chronic small vessel ischemic changes within the cerebral white matter. 3. Mild generalized cerebral atrophy. 4. Mild paranasal sinus disease, as described. CT cervical spine: 1. No evidence of acute fracture to the cervical spine. 2. Straightening of the expected cervical lordosis. 3. Cervicothoracic levocurvature. 4. Trace C6-C7 and C7-T1 grade 1 anterolisthesis. 5. Cervical spondylosis, as described. 6. 1.6 cm left thyroid lobe nodule. A nonemergent thyroid ultrasound is recommended for further evaluation.   Assessment and Plan: Afib rvr, now controlled s/p ccb iv x 2 and restarting rate control medications, hr now <100 -resume home regimen, cardizem CD240 mg p.o. daily, Coreg 6.25 twice daily, Resumed Eliquis 5 mg p.o. twice daily Uncontrolled DMII, mild DKA ANION GAP 16, CO2 19, lactic acid 2.2, beta hydroxybutyrate 2.92.  Resumed home dose Lantus and NovoLog sliding scale, A1c 11.1 uncontrolled diabetes. S/p IVF given for hydration.  Patient needs help with medications at home which will be arranged.   UTI, s/p ceftriaxone IV given during hospital stay, started Omnicef 300 twice daily on discharge for 3 days, f/u on urine culture, still pending Hypomagnesemia, repleted and resolved Left thyroids nodule, will need thyroid u/s as out patient, TSH 1.5 stable WNL Fall, related to dizziness from afib, pt/ot  eval done, recommended home health and PT Depression; Continue ssri  RCC, Follow swith Yu  Lung nodule, follow with PCP to repeat CT chest after 12 months, Followed by oncology   Body mass index is 29.26 kg/m.  Nutrition Problem: Inadequate oral intake Etiology: decreased appetite Nutrition  Interventions: Interventions: Glucerna shake, MVI, Premier Protein  Patient was seen by physical therapy, who recommended Home health, which was arranged. On the day of the discharge the patient's vitals were stable, and no other acute medical condition were reported by patient. the patient was felt safe to be discharge at ALF/ILF with Home health.  Consultants: None Procedures: None  Discharge Exam: General: Appear in no distress, no Rash; Oral Mucosa Clear, moist. Cardiovascular: S1 and S2 Present, no Murmur, Respiratory: normal respiratory effort, Bilateral Air entry present and no Crackles, no wheezes Abdomen: Bowel Sound present, Soft and no tenderness, no hernia Extremities: no Pedal edema, no calf tenderness Neurology: Looks slightly confused but alert and oriented to time, place, and person affect appropriate.  Filed Weights   09/09/21 1113  Weight: 72.6 kg   Vitals:   09/14/21 0427 09/14/21 0841  BP: 123/73 117/72  Pulse: 87 (!) 105  Resp: 18   Temp: 98 F (36.7 C) (!) 97.5 F (36.4 C)  SpO2: 100%     DISCHARGE MEDICATION: Allergies as of 09/14/2021       Reactions   Kiwi Extract         Medication List     STOP taking these medications    metoprolol succinate 25 MG 24 hr tablet Commonly known as: TOPROL-XL       TAKE these medications    acetaminophen 325 MG tablet Commonly known as: TYLENOL Take 2 tablets (650 mg total) by mouth every 6 (six) hours as needed for mild pain, fever or headache.   apixaban 5 MG Tabs tablet Commonly known as: ELIQUIS Take 1 tablet (5 mg total) by mouth 2 (two) times daily.   carvedilol 6.25 MG tablet Commonly known as: COREG Take 1 tablet (6.25 mg total) by mouth 2 (two) times daily with a meal.   cetirizine 10 MG tablet Commonly known as: ZYRTEC Take 0.5 tablets (5 mg total) by mouth daily.   diltiazem 240 MG 24 hr capsule Commonly known as: CARDIZEM CD Take 1 capsule (240 mg total) by mouth every  evening.   escitalopram 5 MG tablet Commonly known as: LEXAPRO Take 1 tablet (5 mg total) by mouth daily. What changed:  medication strength how much to take   feeding supplement (GLUCERNA SHAKE) Liqd Take 237 mLs by mouth 3 (three) times daily between meals.   insulin aspart 100 UNIT/ML injection Commonly known as: novoLOG Inject 0-15 Units into the skin 3 (three) times daily with meals. CBG 70 - 120: 0 units,  121 - 150: 2 units, 151 - 200: 3 units,  201 - 250: 5 units,  251 - 300: 8 units, 301 - 350: 11 units 351 - 400: 15 units, CBG > 400: call MD and obtain STAT lab verification What changed: You were already taking a medication with the same name, and this prescription was added. Make sure you understand how and when to take each.   insulin aspart 100 UNIT/ML injection Commonly known as: novoLOG Inject 4 Units into the skin 3 (three) times daily with meals. What changed: Another medication with the same name was added. Make sure you understand how and when to take each.   insulin glargine 100  UNIT/ML injection Commonly known as: LANTUS Inject 0.27 mLs (27 Units total) into the skin 2 (two) times daily.   metFORMIN 500 MG 24 hr tablet Commonly known as: GLUCOPHAGE-XR Take 1 tablet (500 mg total) by mouth 2 (two) times daily.   multivitamin with minerals Tabs tablet Take 1 tablet by mouth daily.   polyethylene glycol 17 g packet Commonly known as: MIRALAX / GLYCOLAX Take 17 g by mouth daily. Hold if loose stools or frequent BMs       Allergies  Allergen Reactions   Kiwi Extract    Discharge Instructions     Call MD for:  difficulty breathing, headache or visual disturbances   Complete by: As directed    Call MD for:  extreme fatigue   Complete by: As directed    Call MD for:  persistant dizziness or light-headedness   Complete by: As directed    Call MD for:  severe uncontrolled pain   Complete by: As directed    Call MD for:  temperature >100.4   Complete  by: As directed    Diet - low sodium heart healthy   Complete by: As directed    Discharge instructions   Complete by: As directed    Follow with PCP in 1 week, patient has uncontrolled diabetes and confused about how to take medications. Patient being discharged back to [facility] and arrangement will be done for the patient so someone can help with medications, TOC help appreciated   Increase activity slowly   Complete by: As directed        The results of significant diagnostics from this hospitalization (including imaging, microbiology, ancillary and laboratory) are listed below for reference.    Significant Diagnostic Studies: DG Pelvis 1-2 Views  Result Date: 09/09/2021 CLINICAL DATA:  Fall, head injury EXAM: PELVIS - 1-2 VIEW COMPARISON:  None. FINDINGS: There is no evidence of pelvic fracture or diastasis. No pelvic bone lesions are seen. IMPRESSION: Negative. Electronically Signed   By: Franchot Gallo M.D.   On: 09/09/2021 12:35   CT HEAD WO CONTRAST (5MM)  Result Date: 09/09/2021 CLINICAL DATA:  Head trauma, moderate/severe. Additional history provided: Fall (hitting head). Dizziness prior to fall. Patient on Eliquis. Neck trauma. EXAM: CT HEAD WITHOUT CONTRAST CT CERVICAL SPINE WITHOUT CONTRAST TECHNIQUE: Multidetector CT imaging of the head and cervical spine was performed following the standard protocol without intravenous contrast. Multiplanar CT image reconstructions of the cervical spine were also generated. COMPARISON:  Head CT 07/23/2021. Brain MRI 03/06/2021. CT of the cervical spine 07/23/2021. FINDINGS: CT HEAD FINDINGS Brain: Mild generalized cerebral atrophy. Mild ill-defined hypoattenuation within the cerebral white matter, nonspecific but compatible with chronic small vessel ischemic disease. There is no acute intracranial hemorrhage. No demarcated cortical infarct. No extra-axial fluid collection. No evidence of an intracranial mass. No midline shift. Vascular:  No hyperdense vessel.  Atherosclerotic calcifications. Skull: Normal. Negative for fracture or focal lesion. Sinuses/Orbits: Visualized orbits show no acute finding. Mild mucosal thickening within the right maxillary sinus. Trace mucosal thickening within the bilateral ethmoid sinuses. CT CERVICAL SPINE FINDINGS Alignment: Cervicothoracic levocurvature. Straightening of the expected cervical lordosis. Trace C6-C7 and C7-T1 grade 1 anterolisthesis. Skull base and vertebrae: The basion-dental and atlanto-dental intervals are maintained.No evidence of acute fracture to the cervical spine. Soft tissues and spinal canal: No prevertebral fluid or swelling. No visible canal hematoma. Disc levels: Cervical spondylosis with multilevel disc space narrowing, disc bulges, uncovertebral hypertrophy and facet arthrosis. No appreciable high-grade spinal canal  stenosis. Multilevel bony neural foraminal narrowing. Degenerative changes are also present about the C1-C2 articulation. Upper chest: No consolidation within the imaged lung apices. No visible pneumothorax. Other: 1.6 cm left thyroid lobe nodule. IMPRESSION: CT head: 1. No evidence of acute intracranial abnormality. 2. Mild chronic small vessel ischemic changes within the cerebral white matter. 3. Mild generalized cerebral atrophy. 4. Mild paranasal sinus disease, as described. CT cervical spine: 1. No evidence of acute fracture to the cervical spine. 2. Straightening of the expected cervical lordosis. 3. Cervicothoracic levocurvature. 4. Trace C6-C7 and C7-T1 grade 1 anterolisthesis. 5. Cervical spondylosis, as described. 6. 1.6 cm left thyroid lobe nodule. A nonemergent thyroid ultrasound is recommended for further evaluation. Electronically Signed   By: Kellie Simmering D.O.   On: 09/09/2021 12:10   CT Angio Chest Pulmonary Embolism (PE) W or WO Contrast  Result Date: 09/09/2021 CLINICAL DATA:  Concern for pulmonary embolism. EXAM: CT ANGIOGRAPHY CHEST WITH CONTRAST  TECHNIQUE: Multidetector CT imaging of the chest was performed using the standard protocol during bolus administration of intravenous contrast. Multiplanar CT image reconstructions and MIPs were obtained to evaluate the vascular anatomy. CONTRAST:  3mL OMNIPAQUE IOHEXOL 350 MG/ML SOLN COMPARISON:  CT dated 06/23/2021 and radiograph dated 09/09/2021. FINDINGS: Cardiovascular: Mild cardiomegaly. No pericardial effusion. Coronary vascular calcification. There is mild atherosclerotic calcification of the thoracic aorta. No aneurysmal dilatation. There is dilatation of the main pulmonary trunk suggestive of pulmonary hypertension. Evaluation of the pulmonary arteries is limited due to respiratory motion artifact. No pulmonary artery embolus identified. Mediastinum/Nodes: No hilar or mediastinal adenopathy. The esophagus is grossly unremarkable. No mediastinal fluid collection. Lungs/Pleura: There is a 5 mm nodule in the right upper lobe (19/6). A cluster of nodularity in the right upper lobe laterally measures up to 13 mm (28/6) and corresponds to the nodule seen on the prior CT. Overall there has been interval decrease in the nodular appearance since the prior CT. This likely represents a post inflammatory process. No new consolidation. There is no pleural effusion pneumothorax. The central airways are patent. Upper Abdomen: Multiple surgical clips with associated streak artifact. Partially visualized right renal mass measuring up to 4 cm. Postsurgical changes of the stomach. Musculoskeletal: Osteopenia degenerative changes of the spine. No acute osseous pathology. Review of the MIP images confirms the above findings. IMPRESSION: 1. No acute intrathoracic pathology. No CT evidence of pulmonary artery embolus. 2. Cluster of nodularity in the right upper lobe with interval decrease in the nodular appearance since the prior CT. This likely represents a post inflammatory process. 3. A 5 mm right upper lobe pulmonary  nodule. No follow-up needed if patient is low-risk. Non-contrast chest CT can be considered in 12 months if patient is high-risk. This recommendation follows the consensus statement: Guidelines for Management of Incidental Pulmonary Nodules Detected on CT Images: From the Fleischner Society 2017; Radiology 2017; 284:228-243. 4. Partially visualized right renal mass. 5. Aortic Atherosclerosis (ICD10-I70.0). Electronically Signed   By: Anner Crete M.D.   On: 09/09/2021 21:07   CT Cervical Spine Wo Contrast  Result Date: 09/09/2021 CLINICAL DATA:  Head trauma, moderate/severe. Additional history provided: Fall (hitting head). Dizziness prior to fall. Patient on Eliquis. Neck trauma. EXAM: CT HEAD WITHOUT CONTRAST CT CERVICAL SPINE WITHOUT CONTRAST TECHNIQUE: Multidetector CT imaging of the head and cervical spine was performed following the standard protocol without intravenous contrast. Multiplanar CT image reconstructions of the cervical spine were also generated. COMPARISON:  Head CT 07/23/2021. Brain MRI 03/06/2021. CT of the cervical  spine 07/23/2021. FINDINGS: CT HEAD FINDINGS Brain: Mild generalized cerebral atrophy. Mild ill-defined hypoattenuation within the cerebral white matter, nonspecific but compatible with chronic small vessel ischemic disease. There is no acute intracranial hemorrhage. No demarcated cortical infarct. No extra-axial fluid collection. No evidence of an intracranial mass. No midline shift. Vascular: No hyperdense vessel.  Atherosclerotic calcifications. Skull: Normal. Negative for fracture or focal lesion. Sinuses/Orbits: Visualized orbits show no acute finding. Mild mucosal thickening within the right maxillary sinus. Trace mucosal thickening within the bilateral ethmoid sinuses. CT CERVICAL SPINE FINDINGS Alignment: Cervicothoracic levocurvature. Straightening of the expected cervical lordosis. Trace C6-C7 and C7-T1 grade 1 anterolisthesis. Skull base and vertebrae: The  basion-dental and atlanto-dental intervals are maintained.No evidence of acute fracture to the cervical spine. Soft tissues and spinal canal: No prevertebral fluid or swelling. No visible canal hematoma. Disc levels: Cervical spondylosis with multilevel disc space narrowing, disc bulges, uncovertebral hypertrophy and facet arthrosis. No appreciable high-grade spinal canal stenosis. Multilevel bony neural foraminal narrowing. Degenerative changes are also present about the C1-C2 articulation. Upper chest: No consolidation within the imaged lung apices. No visible pneumothorax. Other: 1.6 cm left thyroid lobe nodule. IMPRESSION: CT head: 1. No evidence of acute intracranial abnormality. 2. Mild chronic small vessel ischemic changes within the cerebral white matter. 3. Mild generalized cerebral atrophy. 4. Mild paranasal sinus disease, as described. CT cervical spine: 1. No evidence of acute fracture to the cervical spine. 2. Straightening of the expected cervical lordosis. 3. Cervicothoracic levocurvature. 4. Trace C6-C7 and C7-T1 grade 1 anterolisthesis. 5. Cervical spondylosis, as described. 6. 1.6 cm left thyroid lobe nodule. A nonemergent thyroid ultrasound is recommended for further evaluation. Electronically Signed   By: Kellie Simmering D.O.   On: 09/09/2021 12:10   DG Chest Portable 1 View  Result Date: 09/09/2021 CLINICAL DATA:  Fall a with head injury.  Weakness. EXAM: PORTABLE CHEST 1 VIEW COMPARISON:  07/23/2021 FINDINGS: Cardiac enlargement. Normal pulmonary vascularity. Negative for heart failure. Lungs clear without infiltrate or effusion. No acute skeletal abnormality. IMPRESSION: No active disease. Electronically Signed   By: Franchot Gallo M.D.   On: 09/09/2021 12:34    Microbiology: Recent Results (from the past 240 hour(s))  Resp Panel by RT-PCR (Flu A&B, Covid) Nasopharyngeal Swab     Status: None   Collection Time: 09/09/21  2:36 PM   Specimen: Nasopharyngeal Swab; Nasopharyngeal(NP) swabs  in vial transport medium  Result Value Ref Range Status   SARS Coronavirus 2 by RT PCR NEGATIVE NEGATIVE Final    Comment: (NOTE) SARS-CoV-2 target nucleic acids are NOT DETECTED.  The SARS-CoV-2 RNA is generally detectable in upper respiratory specimens during the acute phase of infection. The lowest concentration of SARS-CoV-2 viral copies this assay can detect is 138 copies/mL. A negative result does not preclude SARS-Cov-2 infection and should not be used as the sole basis for treatment or other patient management decisions. A negative result may occur with  improper specimen collection/handling, submission of specimen other than nasopharyngeal swab, presence of viral mutation(s) within the areas targeted by this assay, and inadequate number of viral copies(<138 copies/mL). A negative result must be combined with clinical observations, patient history, and epidemiological information. The expected result is Negative.  Fact Sheet for Patients:  EntrepreneurPulse.com.au  Fact Sheet for Healthcare Providers:  IncredibleEmployment.be  This test is no t yet approved or cleared by the [place] FDA and  has been authorized for detection and/or diagnosis of SARS-CoV-2 by FDA under an Emergency Use Authorization (EUA). This  EUA will remain  in effect (meaning this test can be used) for the duration of the COVID-19 declaration under Section 564(b)(1) of the Act, 21 U.S.C.section 360bbb-3(b)(1), unless the authorization is terminated  or revoked sooner.       Influenza A by PCR NEGATIVE NEGATIVE Final   Influenza B by PCR NEGATIVE NEGATIVE Final    Comment: (NOTE) The Xpert Xpress SARS-CoV-2/FLU/RSV plus assay is intended as an aid in the diagnosis of influenza from Nasopharyngeal swab specimens and should not be used as a sole basis for treatment. Nasal washings and aspirates are unacceptable for Xpert Xpress  SARS-CoV-2/FLU/RSV testing.  Fact Sheet for Patients: EntrepreneurPulse.com.au  Fact Sheet for Healthcare Providers: IncredibleEmployment.be  This test is not yet approved or cleared by the [place] FDA and has been authorized for detection and/or diagnosis of SARS-CoV-2 by FDA under an Emergency Use Authorization (EUA). This EUA will remain in effect (meaning this test can be used) for the duration of the COVID-19 declaration under Section 564(b)(1) of the Act, 21 U.S.C. section 360bbb-3(b)(1), unless the authorization is terminated or revoked.  Performed at [facility], [address] [place], [place] [address]      Labs: CBC: Recent Labs  Lab 09/09/21 1137 09/10/21 0500 09/11/21 0646 09/12/21 0920  WBC 7.6 6.6 5.8 6.8  NEUTROABS 5.5  --   --   --   HGB 13.3 12.0 12.9 12.9  HCT 42.7 38.0 40.6 40.8  MCV 87.1 86.4 85.3 84.8  PLT 312 268 245 540   Basic Metabolic Panel: Recent Labs  Lab 09/09/21 1137 09/09/21 2002 09/10/21 0500 09/11/21 0646 09/12/21 0920  NA 131* 137 135 134* 135  K 4.5 3.6 4.5 4.0 4.5  CL 96* 105 103 104 101  CO2 19* 26 24 24 24   GLUCOSE 529* 248* 311* 235* 327*  BUN 15 13 14 13 15   CREATININE 0.66 0.59 0.61 0.39* 0.48  CALCIUM 10.5* 10.0 9.7 9.8 10.0  MG 1.6*  --  1.7 1.5* 1.8  PHOS  --   --  2.8 2.6 3.3   Liver Function Tests: Recent Labs  Lab 09/10/21 0500  AST 12*  ALT 9  ALKPHOS 96  BILITOT 1.0  PROT 6.0*  ALBUMIN 3.0*   No results for input(s): LIPASE, AMYLASE in the last 168 hours. No results for input(s): AMMONIA in the last 168 hours. Cardiac Enzymes: No results for input(s): CKTOTAL, CKMB, CKMBINDEX, TROPONINI in the last 168 hours. BNP (last 3 results) Recent Labs    07/23/21 1148 09/09/21 1137  BNP 164.6* 119.6*   CBG: Recent Labs  Lab 09/13/21 0816 09/13/21 1120 09/13/21 1612 09/13/21 2025 09/14/21 0830  GLUCAP 226* 324* 158* 257* 252*    Time  spent: 35 minutes  Signed:  Val Riles  [facility]  09/14/2021 10:56 AM

## 2021-09-12 NOTE — Progress Notes (Signed)
[facility] Progress Note  Patient: Barbara Frank    HWE:[id number]  DOA: 09/09/2021     Date of Service: the patient was seen and examined on 09/12/2021  Chief Complaint  Patient presents with   Fall   Brief hospital course: Barbara Frank is a 76 y.o. female with medical history significant of  diabetes mellitus, atrial fibrillation on Eliquis, depression, renal cell carcinoma, lung nodule, who presents with fall BIB EMS with complaint of dizziness then fall. Patient on evaluation in ed was found to have afib with rvr to 150's as well as uncontrolled glucose in mid 500's. Patient states that she has been noncompliant with all her medications for the last week. Patient lives in an ALF and has had similar presentation as this 07/23/21. Patient it appears forgets to take her medications. Per patient she was in rehab for 30 days and had recently been discharged 2 weeks ago. She states on discharge she was not given medications. She notes she has some medications at home but ran out and it appears did not seek to refill her medications.  On ros she denies chest pain, sob, f/c/n/v/d /HA/dysuria but does note low appetite and some weight loss,urinary frequency in addition to chronic issue with balance necessitating use of walker. She currently states she feels she is at her baseline.   ED Course: Vitals: Afeb, bp 118/93, hr 151 , r18 sat 98% on ra  Ekg: afib with rvr with RBBB, rate related st-twave changes Patient in ed was given ccb iv x 2,home carvedilol as well as IVFs with improved in her  HR to 100s.  Patient also noted to have + UA  Wbc 7.6, hgb13.3, Bnp 119, AG16 Na 131, bicard 19,glucose 529, Ce 9, Mag 1.6 Cxr :nad, Pelvis xray: nad CT head: 1. No evidence of acute intracranial abnormality. 2. Mild chronic small vessel ischemic changes within the cerebral white matter. 3. Mild generalized cerebral atrophy. 4. Mild paranasal sinus disease, as described. CT cervical spine: 1. No  evidence of acute fracture to the cervical spine. 2. Straightening of the expected cervical lordosis. 3. Cervicothoracic levocurvature. 4. Trace C6-C7 and C7-T1 grade 1 anterolisthesis. 5. Cervical spondylosis, as described. 6. 1.6 cm left thyroid lobe nodule. A nonemergent thyroid ultrasound is recommended for further evaluation.   Assessment and Plan: Afib rvr  -now controlled s/p ccb iv x 2 and restarting rate control medications  -hr now <100 -resume home regimen, cardizem CD240 mg p.o. daily, Coreg 6.25 twice daily -As needed IV metoprolol 2.5 mg every 3 hours -Resumed Eliquis 5 mg p.o. twice daily    Uncontrolled DMII, mild DKA ANION GAP 16, CO2 19, lactic acid 2.2, beta hydroxybutyrate 2.92. start on home lantus 27 units daily, and sliding scale with coverage -a1c 11.1 uncontrolled diabetes -ivfs     UTI  -Continue IV ctx  -f/u on urine culture    Hypomagnesemia  -repleated   Left thyroids nodule  -will need thyroid u/s as out patient - tsh 1.5 stable WNL   Fall -related to dizziness from afib  -pt/ot eval done, recommended home health and PT  Depression; Continue ssri    RCC Follow swith Yu    Lung nodule, follow with PCP to repeat CT chest after 12 months Followed by oncology   Body mass index is 29.26 kg/m.  Interventions:     Diet: Diabetic diet DVT Prophylaxis: Therapeutic Anticoagulation with Eliquis    Advance goals of care discussion: Full code  Family Communication: family  was not present at bedside, at the time of interview.  The pt provided permission to discuss medical plan with the family. Opportunity was given to ask question and all questions were answered satisfactorily.   Disposition:  Pt is from Home, admitted with mild DKA, A. fib with RVR and possible UTI, still has slightly high sugar, urine culture pending, on IV antibiotics and IV fluids, which precludes a safe discharge. Discharge to ALF with home PT and OT, when most likely  tomorrow a.m. [facility] consulted as patient will need help with all the medications, home health agency will be arranged most likely tomorrow a.m.   Subjective: No significant events overnight, patient seems to be doing well, denied any complaints, no chest pain or palpitation, no shortness of breath, patient denied any abdominal pain, no nausea vomiting or diarrhea.  Physical Exam: General:  alert oriented to time, place, and person.  Appear in no distress, affect appropriate Eyes: PERRLA ENT: Oral Mucosa Clear, moist  Neck: no JVD,  Cardiovascular: S1 and S2 Present, no Murmur,  Respiratory: good respiratory effort, Bilateral Air entry equal and Decreased, no Crackles, no wheezes Abdomen: Bowel Sound present, Soft and no tenderness,  Skin: no rashes Extremities: no Pedal edema, no calf tenderness Neurologic: without any new focal findings Gait not checked due to patient safety concerns  Vitals:   09/11/21 2302 09/12/21 0509 09/12/21 0747 09/12/21 1116  BP: 113/62 136/73 138/74 127/63  Pulse: 76 87 93 98  Resp: 16 18 20 17   Temp: 97.9 F (36.6 C) 98.4 F (36.9 C) 98.2 F (36.8 C) 98.2 F (36.8 C)  TempSrc:  Oral    SpO2: 96% 95% 98% 98%  Weight:      Height:        Intake/Output Summary (Last 24 hours) at 09/12/2021 1354 Last data filed at 09/12/2021 1300 Gross per 24 hour  Intake 100 ml  Output 1550 ml  Net -1450 ml   Filed Weights   09/09/21 1113  Weight: 72.6 kg    Data Reviewed: I have personally reviewed and interpreted daily labs, tele strips, imagings as discussed above. I reviewed all nursing notes, pharmacy notes, vitals, pertinent old records I have discussed plan of care as described above with RN and patient/family.  CBC: Recent Labs  Lab 09/09/21 1137 09/10/21 0500 09/11/21 0646 09/12/21 0920  WBC 7.6 6.6 5.8 6.8  NEUTROABS 5.5  --   --   --   HGB 13.3 12.0 12.9 12.9  HCT 42.7 38.0 40.6 40.8  MCV 87.1 86.4 85.3 84.8  PLT 312 268 245 102   Basic  Metabolic Panel: Recent Labs  Lab 09/09/21 1137 09/09/21 2002 09/10/21 0500 09/11/21 0646 09/12/21 0920  NA 131* 137 135 134* 135  K 4.5 3.6 4.5 4.0 4.5  CL 96* 105 103 104 101  CO2 19* 26 24 24 24   GLUCOSE 529* 248* 311* 235* 327*  BUN 15 13 14 13 15   CREATININE 0.66 0.59 0.61 0.39* 0.48  CALCIUM 10.5* 10.0 9.7 9.8 10.0  MG 1.6*  --  1.7 1.5* 1.8  PHOS  --   --  2.8 2.6 3.3    Studies: No results found.  Scheduled Meds:  apixaban  5 mg Oral BID   carvedilol  6.25 mg Oral BID WC   diltiazem  240 mg Oral Daily   escitalopram  10 mg Oral Daily   feeding supplement (GLUCERNA SHAKE)  237 mL Oral BID BM   insulin aspart  0-15  Units Subcutaneous TID WC   insulin aspart  4 Units Subcutaneous TID WC   insulin glargine-yfgn  27 Units Subcutaneous BID   loratadine  10 mg Oral Daily   multivitamin with minerals  1 tablet Oral Daily   polyethylene glycol  17 g Oral Daily   Ensure Max Protein  11 oz Oral Daily   Continuous Infusions:  cefTRIAXone (ROCEPHIN)  IV 1 g (09/12/21 0942)   PRN Meds: acetaminophen **OR** acetaminophen, acetaminophen, ketorolac  Time spent: 35 minutes  Author: Val Riles. MD [facility] 09/12/2021 1:54 PM  To reach On-call, see care teams to locate the attending and reach out to them via [email or web address]. If 7PM-7AM, please contact night-coverage If you still have difficulty reaching the attending provider, please page the [facility] (Director on Call) for [facility] on amion for assistance.

## 2021-09-12 NOTE — TOC Transition Note (Signed)
Transition of Care [facility]) - CM/SW Discharge Note   Patient Details  Name: [name] MRN: [id number] Date of Birth: [birth year]  [facility] CM/SW Contact:  Harriet Masson, RN Phone Number: 09/12/2021, 5:24 PM   Clinical Narrative:    Follow up call made to Philippa Sicks at [facility] [phone]) however only able to leave a voice message requesting a call back concerning pt's disposition for ALF d/c.  TOC will continue to follow up.     Barriers to Discharge: Continued Medical Work up   Patient Goals and CMS Choice Patient states their goals for this hospitalization and ongoing recovery are:: to go home CMS Medicare.gov Compare Post Acute Care list provided to:: Patient Choice offered to / list presented to : Patient  Discharge Placement                       Discharge Plan and Services                                     Social Determinants of Health (SDOH) Interventions     Readmission Risk Interventions No flowsheet data found.

## 2021-09-13 DIAGNOSIS — I4811 Longstanding persistent atrial fibrillation: Secondary | ICD-10-CM | POA: Diagnosis not present

## 2021-09-13 LAB — GLUCOSE, CAPILLARY
Glucose-Capillary: 240 mg/dL — ABNORMAL HIGH (ref 70–99)
Glucose-Capillary: 226 mg/dL — ABNORMAL HIGH (ref 70–99)
Glucose-Capillary: 324 mg/dL — ABNORMAL HIGH (ref 70–99)
Glucose-Capillary: 158 mg/dL — ABNORMAL HIGH (ref 70–99)
Glucose-Capillary: 257 mg/dL — ABNORMAL HIGH (ref 70–99)

## 2021-09-13 NOTE — Progress Notes (Signed)
[facility] Progress Note  Patient: Barbara Frank    MVH:[id number]  DOA: 09/09/2021     Date of Service: the patient was seen and examined on 09/13/2021  Chief Complaint  Patient presents with   Fall   Brief hospital course: Barbara Frank is a 76 y.o. female with medical history significant of  diabetes mellitus, atrial fibrillation on Eliquis, depression, renal cell carcinoma, lung nodule, who presents with fall BIB EMS with complaint of dizziness then fall. Patient on evaluation in ed was found to have afib with rvr to 150's as well as uncontrolled glucose in mid 500's. Patient states that she has been noncompliant with all her medications for the last week. Patient lives in an ALF and has had similar presentation as this 07/23/21. Patient it appears forgets to take her medications. Per patient she was in rehab for 30 days and had recently been discharged 2 weeks ago. She states on discharge she was not given medications. She notes she has some medications at home but ran out and it appears did not seek to refill her medications.  On ros she denies chest pain, sob, f/c/n/v/d /HA/dysuria but does note low appetite and some weight loss,urinary frequency in addition to chronic issue with balance necessitating use of walker. She currently states she feels she is at her baseline.   ED Course: Vitals: Afeb, bp 118/93, hr 151 , r18 sat 98% on ra  Ekg: afib with rvr with RBBB, rate related st-twave changes Patient in ed was given ccb iv x 2,home carvedilol as well as IVFs with improved in her  HR to 100s.  Patient also noted to have + UA  Wbc 7.6, hgb13.3, Bnp 119, AG16 Na 131, bicard 19,glucose 529, Ce 9, Mag 1.6 Cxr :nad, Pelvis xray: nad CT head: 1. No evidence of acute intracranial abnormality. 2. Mild chronic small vessel ischemic changes within the cerebral white matter. 3. Mild generalized cerebral atrophy. 4. Mild paranasal sinus disease, as described. CT cervical spine: 1. No  evidence of acute fracture to the cervical spine. 2. Straightening of the expected cervical lordosis. 3. Cervicothoracic levocurvature. 4. Trace C6-C7 and C7-T1 grade 1 anterolisthesis. 5. Cervical spondylosis, as described. 6. 1.6 cm left thyroid lobe nodule. A nonemergent thyroid ultrasound is recommended for further evaluation.   Assessment and Plan: Afib rvr  -now controlled s/p ccb iv x 2 and restarting rate control medications  -hr now <100 -resume home regimen, cardizem CD240 mg p.o. daily, Coreg 6.25 twice daily -As needed IV metoprolol 2.5 mg every 3 hours -Resumed Eliquis 5 mg p.o. twice daily    Uncontrolled DMII, mild DKA ANION GAP 16, CO2 19, lactic acid 2.2, beta hydroxybutyrate 2.92. start on home lantus 27 units daily, and sliding scale with coverage -a1c 11.1 uncontrolled diabetes -ivfs     UTI  S/p ceftriaxone IV x5 days given, urine culture still pending    Hypomagnesemia  -repleated   Left thyroids nodule  -will need thyroid u/s as out patient - tsh 1.5 stable WNL   Fall -related to dizziness from afib  -pt/ot eval done, recommended home health and PT  Depression; Continue ssri    RCC Follow swith Yu    Lung nodule, follow with PCP to repeat CT chest after 12 months Followed by oncology   Body mass index is 29.26 kg/m.  Interventions:     Diet: Diabetic diet DVT Prophylaxis: Therapeutic Anticoagulation with Eliquis    Advance goals of care discussion: Full code  Family  Communication: family was not present at bedside, at the time of interview.  The pt provided permission to discuss medical plan with the family. Opportunity was given to ask question and all questions were answered satisfactorily.   Disposition:  Pt is from Home, admitted with mild DKA, A. fib with RVR and possible UTI, blood sugar is under control, IV antibiotics given for 5 days.  Patient is clinically stable to discharge.   Discharge to ALF with home PT and OT, when  most likely tomorrow a.m. [facility] consulted as patient will need help with all the medications, home health agency will be arranged most likely tomorrow a.m.   Subjective: No significant events overnight, patient was resting comfortably, patient is slightly confused, AAO x2.  Denied any chest palpitation, no any abdominal pain, no nausea vomiting or diarrhea.    Physical Exam: General:  alert oriented to time, place, and person.  Appear in no distress, affect appropriate Eyes: PERRLA ENT: Oral Mucosa Clear, moist  Neck: no JVD,  Cardiovascular: S1 and S2 Present, no Murmur,  Respiratory: good respiratory effort, Bilateral Air entry equal and Decreased, no Crackles, no wheezes Abdomen: Bowel Sound present, Soft and no tenderness,  Skin: no rashes Extremities: no Pedal edema, no calf tenderness Neurologic: without any new focal findings Gait not checked due to patient safety concerns  Vitals:   09/13/21 0045 09/13/21 0321 09/13/21 0801 09/13/21 1119  BP: (!) 136/55 123/60 130/63 127/63  Pulse:   90 93  Resp: 20 20 18 17   Temp: 97.6 F (36.4 C) 97.7 F (36.5 C) 98.6 F (37 C) 99.1 F (37.3 C)  TempSrc: Axillary Oral  Oral  SpO2: 92% 95% 95% 96%  Weight:      Height:        Intake/Output Summary (Last 24 hours) at 09/13/2021 1430 Last data filed at 09/13/2021 1416 Gross per 24 hour  Intake 1017.5 ml  Output 400 ml  Net 617.5 ml   Filed Weights   09/09/21 1113  Weight: 72.6 kg    Data Reviewed: I have personally reviewed and interpreted daily labs, tele strips, imagings as discussed above. I reviewed all nursing notes, pharmacy notes, vitals, pertinent old records I have discussed plan of care as described above with RN and patient/family.  CBC: Recent Labs  Lab 09/09/21 1137 09/10/21 0500 09/11/21 0646 09/12/21 0920  WBC 7.6 6.6 5.8 6.8  NEUTROABS 5.5  --   --   --   HGB 13.3 12.0 12.9 12.9  HCT 42.7 38.0 40.6 40.8  MCV 87.1 86.4 85.3 84.8  PLT 312 268 245 242    Basic Metabolic Panel: Recent Labs  Lab 09/09/21 1137 09/09/21 2002 09/10/21 0500 09/11/21 0646 09/12/21 0920  NA 131* 137 135 134* 135  K 4.5 3.6 4.5 4.0 4.5  CL 96* 105 103 104 101  CO2 19* 26 24 24 24   GLUCOSE 529* 248* 311* 235* 327*  BUN 15 13 14 13 15   CREATININE 0.66 0.59 0.61 0.39* 0.48  CALCIUM 10.5* 10.0 9.7 9.8 10.0  MG 1.6*  --  1.7 1.5* 1.8  PHOS  --   --  2.8 2.6 3.3    Studies: No results found.  Scheduled Meds:  apixaban  5 mg Oral BID   carvedilol  6.25 mg Oral BID WC   diltiazem  240 mg Oral Daily   escitalopram  10 mg Oral Daily   feeding supplement (GLUCERNA SHAKE)  237 mL Oral BID BM   insulin aspart  0-15 Units Subcutaneous TID WC   insulin aspart  4 Units Subcutaneous TID WC   insulin glargine-yfgn  27 Units Subcutaneous BID   loratadine  10 mg Oral Daily   magnesium oxide  400 mg Oral BID   multivitamin with minerals  1 tablet Oral Daily   polyethylene glycol  17 g Oral Daily   Ensure Max Protein  11 oz Oral Daily   Continuous Infusions:   PRN Meds: acetaminophen **OR** acetaminophen, acetaminophen, ketorolac  Time spent: 35 minutes  Author: Val Riles. MD [facility] 09/13/2021 2:30 PM  To reach On-call, see care teams to locate the attending and reach out to them via [email or web address]. If 7PM-7AM, please contact night-coverage If you still have difficulty reaching the attending provider, please page the [facility] (Director on Call) for [facility] on amion for assistance.

## 2021-09-14 ENCOUNTER — Emergency Department
Admission: EM | Admit: 2021-09-14 | Discharge: 2021-09-16 | Disposition: A | Discharge status: Home / Self Care | Payer: Medicare PPO | Source: Home / Self Care | Attending: Emergency Medicine | Admitting: Emergency Medicine

## 2021-09-14 ENCOUNTER — Other Ambulatory Visit: Payer: Self-pay

## 2021-09-14 DIAGNOSIS — I4891 Unspecified atrial fibrillation: Secondary | ICD-10-CM | POA: Insufficient documentation

## 2021-09-14 DIAGNOSIS — Z20822 Contact with and (suspected) exposure to covid-19: Secondary | ICD-10-CM | POA: Insufficient documentation

## 2021-09-14 DIAGNOSIS — Z7901 Long term (current) use of anticoagulants: Secondary | ICD-10-CM | POA: Insufficient documentation

## 2021-09-14 DIAGNOSIS — Z7409 Other reduced mobility: Secondary | ICD-10-CM

## 2021-09-14 DIAGNOSIS — I4811 Longstanding persistent atrial fibrillation: Secondary | ICD-10-CM | POA: Diagnosis not present

## 2021-09-14 DIAGNOSIS — E119 Type 2 diabetes mellitus without complications: Secondary | ICD-10-CM | POA: Insufficient documentation

## 2021-09-14 DIAGNOSIS — Z85528 Personal history of other malignant neoplasm of kidney: Secondary | ICD-10-CM | POA: Insufficient documentation

## 2021-09-14 LAB — CBC
HCT: 39.1 % (ref 36.0–46.0)
Hemoglobin: 12.6 g/dL (ref 12.0–15.0)
MCH: 27.6 pg (ref 26.0–34.0)
MCHC: 32.2 g/dL (ref 30.0–36.0)
MCV: 85.7 fL (ref 80.0–100.0)
Platelets: 243 10*3/uL (ref 150–400)
RBC: 4.56 MIL/uL (ref 3.87–5.11)
RDW: 15 % (ref 11.5–15.5)
WBC: 7.6 10*3/uL (ref 4.0–10.5)
nRBC: 0 % (ref 0.0–0.2)

## 2021-09-14 LAB — GLUCOSE, CAPILLARY
Glucose-Capillary: 252 mg/dL — ABNORMAL HIGH (ref 70–99)
Glucose-Capillary: 318 mg/dL — ABNORMAL HIGH (ref 70–99)

## 2021-09-14 LAB — RESP PANEL BY RT-PCR (FLU A&B, COVID) ARPGX2
Influenza A by PCR: NEGATIVE
Influenza B by PCR: NEGATIVE
SARS Coronavirus 2 by RT PCR: NEGATIVE

## 2021-09-14 LAB — BASIC METABOLIC PANEL
Anion gap: 7 (ref 5–15)
BUN: 17 mg/dL (ref 8–23)
CO2: 27 mmol/L (ref 22–32)
Calcium: 10.2 mg/dL (ref 8.9–10.3)
Chloride: 98 mmol/L (ref 98–111)
Creatinine, Ser: 0.41 mg/dL — ABNORMAL LOW (ref 0.44–1.00)
GFR, Estimated: >60 mL/min (ref >60)
Glucose, Bld: 255 mg/dL — ABNORMAL HIGH (ref 70–99)
Potassium: 4.5 mmol/L (ref 3.5–5.1)
Sodium: 132 mmol/L — ABNORMAL LOW (ref 135–145)

## 2021-09-14 LAB — CBG MONITORING, ED: Glucose-Capillary: 214 mg/dL — ABNORMAL HIGH (ref 70–99)

## 2021-09-14 MED ORDER — ESCITALOPRAM OXALATE 10 MG PO TABS
5.0000 mg | ORAL_TABLET | Freq: Every day | ORAL | Status: DC
  Administered 2021-09-15 – 2021-09-16 (×3): 5 mg via ORAL
  Filled 2021-09-14 (×4): qty 1

## 2021-09-14 MED ORDER — INSULIN ASPART 100 UNIT/ML IJ SOLN
4.0000 [IU] | Freq: Three times a day (TID) | INTRAMUSCULAR | Status: DC
  Administered 2021-09-14 – 2021-09-16 (×3): 4 [IU] via SUBCUTANEOUS
  Filled 2021-09-14 (×3): qty 1

## 2021-09-14 MED ORDER — DILTIAZEM HCL ER COATED BEADS 240 MG PO CP24
240.0000 mg | ORAL_CAPSULE | Freq: Every evening | ORAL | Status: DC
  Administered 2021-09-15: 240 mg via ORAL
  Filled 2021-09-14 (×2): qty 1

## 2021-09-14 MED ORDER — ACETAMINOPHEN 325 MG PO TABS: 650.0000 mg | ORAL_TABLET | Freq: Four times a day (QID) | ORAL | Status: DC | PRN

## 2021-09-14 MED ORDER — METFORMIN HCL ER 500 MG PO TB24
500.0000 mg | ORAL_TABLET | Freq: Two times a day (BID) | ORAL | Status: DC
  Administered 2021-09-14 – 2021-09-16 (×4): 500 mg via ORAL
  Filled 2021-09-14 (×7): qty 1

## 2021-09-14 MED ORDER — APIXABAN 5 MG PO TABS
5.0000 mg | ORAL_TABLET | Freq: Two times a day (BID) | ORAL | Status: DC
  Administered 2021-09-15 – 2021-09-16 (×4): 5 mg via ORAL
  Filled 2021-09-14 (×4): qty 1

## 2021-09-14 MED ORDER — INSULIN GLARGINE-YFGN 100 UNIT/ML ~~LOC~~ SOLN
27.0000 [IU] | Freq: Two times a day (BID) | SUBCUTANEOUS | Status: DC
  Administered 2021-09-15 – 2021-09-16 (×4): 27 [IU] via SUBCUTANEOUS
  Filled 2021-09-14 (×7): qty 0.27

## 2021-09-14 MED ORDER — CARVEDILOL 6.25 MG PO TABS
6.2500 mg | ORAL_TABLET | Freq: Two times a day (BID) | ORAL | Status: DC
  Administered 2021-09-14 – 2021-09-16 (×4): 6.25 mg via ORAL
  Filled 2021-09-14 (×3): qty 1

## 2021-09-14 MED ORDER — INSULIN ASPART 100 UNIT/ML IJ SOLN: 0.0000 [IU] | Freq: Three times a day (TID) | INTRAMUSCULAR | Status: DC

## 2021-09-14 NOTE — NC FL2 (Signed)
[place] LEVEL OF CARE SCREENING TOOL     IDENTIFICATION  Patient Name: Barbara Frank Birthdate: 1946/06/01 Sex: female Admission Date (Current Location): 09/09/2021  [facility] [place] and [place] Number:  Engineering geologist and Address:  [facility], [address], [place], [place] [address]      Provider Number: [id number]  Attending Physician Name and Address:  Val Riles, MD  Relative Name and Phone Number:  [name] (niece) [phone]    Current Level of Care: Hospital Recommended Level of Care: [facility]) Prior Approval Number:    Date Approved/Denied:   [place] Number: [id number]  Discharge Plan: Other (Comment) [facility])    Current Diagnoses: Patient Active Problem List   Diagnosis Date Noted   A-fib ([place]) 09/09/2021   Constipation    Generalized weakness    Fall at home, initial encounter 07/23/2021   Lung nodule 07/23/2021   Elevated lactic acid level 07/23/2021   Prolonged QT interval 07/23/2021   Atrial fibrillation, chronic (HCC)    Weakness    Palliative care encounter    Acute cystitis with hematuria    Type 2 diabetes mellitus without complication, with long-term current use of insulin (HCC)    Increased ammonia level    Encephalopathy 03/07/2021   Transient confusion 03/06/2021   Intertrigo 03/06/2021   Encounter for antineoplastic chemotherapy 01/08/2021   Encounter for antineoplastic immunotherapy 01/08/2021   Goals of care, counseling/discussion 10/17/2020   Hypercalcemia 10/17/2020   Chest mass 10/17/2020   Atrial fibrillation with RVR ([place]) 09/26/2020   Depression 09/26/2020   Diabetes type 2, uncontrolled 09/26/2020   Hypokalemia 09/26/2020   Renal cell carcinoma ([place]) 09/26/2020   Acute delirium 09/26/2020   Ureteral stone 09/26/2020    Orientation RESPIRATION BLADDER Height & Weight     Self, Time, Place  Normal Incontinent, External catheter Weight: 160 lb  (72.6 kg) Height:  5\' 2"  (157.5 cm)  BEHAVIORAL SYMPTOMS/MOOD NEUROLOGICAL BOWEL NUTRITION STATUS      Continent Diet (cardiac diet)  AMBULATORY STATUS COMMUNICATION OF NEEDS Skin   Limited Assist Verbally Normal                       Personal Care Assistance Level of Assistance  Bathing, Feeding, Dressing, Total care Bathing Assistance: Independent Feeding assistance: Independent Dressing Assistance: Limited assistance Total Care Assistance: Limited assistance   Functional Limitations Info  Sight, Hearing, Speech Sight Info: Impaired Hearing Info: Adequate Speech Info: Adequate    SPECIAL CARE FACTORS FREQUENCY  PT (By licensed PT)     PT Frequency: [facility]              Contractures Contractures Info: Not present    Additional Factors Info  Code Status, Allergies Code Status Info: full Allergies Info: kiwi extract               Discharge Medications:  TAKE these medications     acetaminophen 325 MG tablet Commonly known as: TYLENOL Take 2 tablets (650 mg total) by mouth every 6 (six) hours as needed for mild pain, fever or headache.    apixaban 5 MG Tabs tablet Commonly known as: ELIQUIS Take 1 tablet (5 mg total) by mouth 2 (two) times daily.    carvedilol 6.25 MG tablet Commonly known as: COREG Take 1 tablet (6.25 mg total) by mouth 2 (two) times daily with a meal.    cefdinir 300 MG capsule Commonly known as: OMNICEF Take 1  capsule (300 mg total) by mouth 2 (two) times daily for 3 days.    cetirizine 10 MG tablet Commonly known as: ZYRTEC Take 0.5 tablets (5 mg total) by mouth daily.    diltiazem 240 MG 24 hr capsule Commonly known as: CARDIZEM CD Take 1 capsule (240 mg total) by mouth every evening.    escitalopram 5 MG tablet Commonly known as: LEXAPRO Take 1 tablet (5 mg total) by mouth daily. What changed:  medication strength how much to take    feeding supplement (GLUCERNA SHAKE) Liqd Take 237 mLs by mouth 3  (three) times daily between meals.    insulin aspart 100 UNIT/ML injection Commonly known as: novoLOG Inject 0-15 Units into the skin 3 (three) times daily with meals. CBG 70 - 120: 0 units,  121 - 150: 2 units, 151 - 200: 3 units,  201 - 250: 5 units,  251 - 300: 8 units, 301 - 350: 11 units 351 - 400: 15 units, CBG > 400: call MD and obtain STAT lab verification What changed: You were already taking a medication with the same name, and this prescription was added. Make sure you understand how and when to take each.    insulin aspart 100 UNIT/ML injection Commonly known as: novoLOG Inject 4 Units into the skin 3 (three) times daily with meals. What changed: Another medication with the same name was added. Make sure you understand how and when to take each.    insulin glargine 100 UNIT/ML injection Commonly known as: LANTUS Inject 0.27 mLs (27 Units total) into the skin 2 (two) times daily.    metFORMIN 500 MG 24 hr tablet Commonly known as: GLUCOPHAGE-XR Take 1 tablet (500 mg total) by mouth 2 (two) times daily.    multivitamin with minerals Tabs tablet Take 1 tablet by mouth daily.    polyethylene glycol 17 g packet Commonly known as: MIRALAX / GLYCOLAX Take 17 g by mouth daily. Hold if loose stools or frequent BMs      Relevant Imaging Results:  Relevant Lab Results:   Additional Information SSN: [id number]  Alberteen Sam, LCSW

## 2021-09-14 NOTE — ED Triage Notes (Signed)
Pt to ED [facility] from [facility] for placement. Reports pt was d/c from admission a couple hours ago, cannot stay at [facility].  Denies complaints.

## 2021-09-14 NOTE — TOC Transition Note (Addendum)
Transition of Care [facility]) - CM/SW Discharge Note   Patient Details  Name: Barbara Frank MRN: [id number] Date of Birth: Apr 22, 1946  [facility]) CM/SW Contact:  Alberteen Sam, LCSW Phone Number: 09/14/2021, 9:43 AM   Clinical Narrative:     [facility] will [facility]  Anticipated DC date: 09/14/21 Family notified: niece [name]- lvm Transport by: Johnanna Schneiders  Per MD patient ready for DC to [facility] . RN, patient, patient's family, and facility notified of DC. Discharge Summary sent to facility at [phone]. Per Rollene Fare with [facility] no need to call for report  .  DC packet on chart. Ambulance transport requested for patient.   Per facility patient continues to be non compliant with medications despite interventions at facility and with home health care agency [facility], they will continue to support patient best they can with her consistent noncompliance.    CSW signing off.  Pricilla Riffle, LCSW     Final next level of care: Assisted Living Barriers to Discharge: No Barriers Identified   Patient Goals and CMS Choice Patient states their goals for this hospitalization and ongoing recovery are:: to go home CMS Medicare.gov Compare Post Acute Care list provided to:: Patient Choice offered to / list presented to : Patient  Discharge Placement              Patient chooses bed at:  [facility]) Patient to be transferred to facility by: [facility] Name of family member notified: niece [name] Patient and family notified of of transfer: 09/14/21  Discharge Plan and Services                          HH Arranged: PT [facility] Agency: [place] Date [place]: 09/14/21 Time [place]: [phone] Representative spoke with at [place]: [place] ([place]) Interventions     Readmission Risk Interventions No flowsheet data found.

## 2021-09-14 NOTE — ED Provider Notes (Signed)
[facility] Provider Note    Event Date/Time   First MD Initiated Contact with Patient 09/14/21 1608     (approximate)   History   placement    HPI  Barbara Frank is a 76 y.o. female with history of diabetes, A. fib on Eliquis, renal cell carcinoma, here with difficulty ambulating at her home.  The patient was just admitted for uncontrolled diabetes and A. fib RVR.  Discharge summary from earlier today reviewed.  She was also treated for UTI.  She was sent home and reportedly was very weak, cannot ambulate on her own, and was sent here for further evaluation.  She states that she had felt somewhat better when she was in the hospital but had felt weak today and did not know she was going home until someone showed up to take her.  She states that she does not feel safe being at home and does not feel safe ambulating.  She normally was amatory without difficulty.  Denies any other acute changes in health.  No other medication changes.   Physical Exam   Triage Vital Signs: ED Triage Vitals  Enc Vitals Group     BP 09/14/21 1402 (!) 121/57     Pulse Rate 09/14/21 1402 77     Resp 09/14/21 1402 18     Temp 09/14/21 1402 98.2 F (36.8 C)     Temp Source 09/14/21 1402 Oral     SpO2 09/14/21 1402 96 %     Weight 09/14/21 1403 160 lb 15 oz (73 kg)     Height 09/14/21 1403 5\' 2"  (1.575 m)     Head Circumference --      Peak Flow --      Pain Score 09/14/21 1403 0     Pain Loc --      Pain Edu? --      Excl. in [place]? --     Most recent vital signs: Vitals:   09/14/21 1402  BP: (!) 121/57  Pulse: 77  Resp: 18  Temp: 98.2 F (36.8 C)  SpO2: 96%     General: Awake, no distress.  CV:  Good peripheral perfusion.  Resp:  Normal effort.  Abd:  No distention.  Other:  No focal neurological deficits.   ED Results / Procedures / Treatments   Labs (all labs ordered are listed, but only abnormal results are displayed) Labs Reviewed  BASIC METABOLIC  PANEL - Abnormal; Notable for the following components:      Result Value   Sodium 132 (*)    Glucose, Bld 255 (*)    Creatinine, Ser 0.41 (*)    All other components within normal limits  CBC     EKG     RADIOLOGY     PROCEDURES:  Critical Care performed: No    MEDICATIONS ORDERED IN ED: Medications  acetaminophen (TYLENOL) tablet 650 mg (has no administration in time range)  apixaban (ELIQUIS) tablet 5 mg (has no administration in time range)  carvedilol (COREG) tablet 6.25 mg (has no administration in time range)  diltiazem (CARDIZEM CD) 24 hr capsule 240 mg (has no administration in time range)  escitalopram (LEXAPRO) tablet 5 mg (has no administration in time range)  insulin aspart (novoLOG) injection 0-15 Units (has no administration in time range)  insulin aspart (novoLOG) injection 4 Units (has no administration in time range)  insulin glargine-yfgn (SEMGLEE) injection 27 Units (has no administration in time range)  metFORMIN (GLUCOPHAGE-XR) 24 hr  tablet 500 mg (has no administration in time range)     IMPRESSION / MDM / ASSESSMENT AND PLAN / ED COURSE  I reviewed the triage vital signs and the nursing notes.                               Plan: Unfortunately, patient is unable to ambulate independently here.  She does not feel safe going home to independent living which I think is reasonable given her limited mobility.  I discussed the case with social work who will evaluate for further disposition.  PT and OT consulted.  Patient is otherwise hemodynamically stable and was medically cleared just hours prior to my assessment.  Will continue her home medications.  Vital signs stable.  Patient is in agreement this plan.   MEDICATIONS GIVEN IN ED: Medications  acetaminophen (TYLENOL) tablet 650 mg (has no administration in time range)  apixaban (ELIQUIS) tablet 5 mg (has no administration in time range)  carvedilol (COREG) tablet 6.25 mg (has no administration  in time range)  diltiazem (CARDIZEM CD) 24 hr capsule 240 mg (has no administration in time range)  escitalopram (LEXAPRO) tablet 5 mg (has no administration in time range)  insulin aspart (novoLOG) injection 0-15 Units (has no administration in time range)  insulin aspart (novoLOG) injection 4 Units (has no administration in time range)  insulin glargine-yfgn (SEMGLEE) injection 27 Units (has no administration in time range)  metFORMIN (GLUCOPHAGE-XR) 24 hr tablet 500 mg (has no administration in time range)      Consults: Physical therapy, Occupational Therapy   EMR reviewed D/c summary from today     FINAL CLINICAL IMPRESSION(S) / ED DIAGNOSES   Final diagnoses:  Impaired mobility     Rx / DC Orders   ED Discharge Orders     None        Note:  This document was prepared using Dragon voice recognition software and may include unintentional dictation errors.   Duffy Bruce, MD 09/14/21 [phone]

## 2021-09-15 LAB — CBG MONITORING, ED
Glucose-Capillary: 239 mg/dL — ABNORMAL HIGH (ref 70–99)
Glucose-Capillary: 255 mg/dL — ABNORMAL HIGH (ref 70–99)

## 2021-09-15 NOTE — Evaluation (Signed)
Physical Therapy Evaluation Patient Details Name: Barbara Frank MRN: [id number] DOB: 03/24/1946 Today's Date: 09/15/2021  History of Present Illness  Pt admitted for weakness and reports she recently was discharged yesterday to [facility]. History includes DM, Afib, depression, renal cell carcinoma, and had recent fall.  Clinical Impression  Pt is a pleasant 76 year old female who was admitted for weakness and was just d/c'd yesterday. Pt performs bed mobility/transfers with mod assist and unable to ambulate at this time due to weakness. Pt demonstrates deficits with strength/mobility/balance. All mobility performed on RA with sats at 95%. She is currently not at baseline level and is agreeable to more assistance via SNF prior to returning to [facility]. Encouraged to continue mobility efforts with staff to improve independence. Would benefit from skilled PT to address above deficits and promote optimal return to PLOF; recommend transition to STR upon discharge from acute hospitalization.      Recommendations for follow up therapy are one component of a multi-disciplinary discharge planning process, led by the attending physician.  Recommendations may be updated based on patient status, additional functional criteria and insurance authorization.  Follow Up Recommendations Skilled nursing-short term rehab (<3 hours/day)    Assistance Recommended at Discharge Frequent or constant Supervision/Assistance  Patient can return home with the following  Two people to help with walking and/or transfers;A lot of help with bathing/dressing/bathroom    Equipment Recommendations None recommended by PT  Recommendations for Other Services       Functional Status Assessment Patient has had a recent decline in their functional status and demonstrates the ability to make significant improvements in function in a reasonable and predictable amount of time.     Precautions / Restrictions  Precautions Precautions: Fall Restrictions Weight Bearing Restrictions: No      Mobility  Bed Mobility Overal bed mobility: Needs Assistance Bed Mobility: Supine to Sit;Sit to Supine     Supine to sit: Mod assist Sit to supine: Mod assist   General bed mobility comments: needs assist for trunkal elevation and sliding B LEs out of bed. Once seated, min assist for scooting out towards EOB    Transfers Overall transfer level: Needs assistance Equipment used: Rolling walker (2 wheels) Transfers: Sit to/from Stand Sit to Stand: Mod assist           General transfer comment: pt fearful of standing attempts. Able to perform 2 bouts of standing, however can only tolerate x 5-10 seconds each. Attempted to do pre gait activities, but pt loses balance and has decreased controlled descent back to bed.    Ambulation/Gait               General Gait Details: unable to side step at [facility]  Stairs            Wheelchair Mobility    Modified Rankin (Stroke Patients Only)       Balance Overall balance assessment: Needs assistance;History of Falls Sitting-balance support: Feet supported Sitting balance-Leahy Scale: Fair     Standing balance support: Bilateral upper extremity supported Standing balance-Leahy Scale: Zero                               Pertinent Vitals/Pain Pain Assessment: Faces Faces Pain Scale: Hurts little more Pain Location: B LEs Pain Descriptors / Indicators: Discomfort;Sore Pain Intervention(s): Limited activity within patient's tolerance;Repositioned    Home Living Family/patient expects to be discharged to:: Assisted living Living Arrangements:  (  from ALF)   Type of Home: Assisted living Home Access: Level entry       Home Layout: One level Home Equipment: Rollator (4 wheels) Additional Comments: [facility]    Prior Function Prior Level of Function : Independent/Modified Independent              Mobility Comments: reports she uses rollator at baseline and has typically been very independent with mobility until yesterday when she couldn't ambulate ADLs Comments: Pt reports Ind for walk in shower and dressing. She ambulates to community dining room for meals.     Hand Dominance        Extremity/Trunk Assessment   Upper Extremity Assessment Upper Extremity Assessment: Generalized weakness (B UE grossly 4/5)    Lower Extremity Assessment Lower Extremity Assessment: Generalized weakness (B LE grossly 3/5 with R>L)       Communication   Communication: No difficulties  Cognition Arousal/Alertness: Awake/alert Behavior During Therapy: WFL for tasks assessed/performed Overall Cognitive Status: Within Functional Limits for tasks assessed                                 General Comments: initially sleeping, however easily arousable and pleasant throughout        General Comments      Exercises Other Exercises Other Exercises: supine ther-ex performed on B LE including LAQ, alt marching. L LE appears weaker compared to R LE. Fatigues rapidly with exertion   Assessment/Plan    PT Assessment Patient needs continued PT services  PT Problem List Decreased strength;Decreased mobility;Decreased activity tolerance;Decreased balance       PT Treatment Interventions DME instruction;Therapeutic activities;Gait training;Therapeutic exercise;Patient/family education;Functional mobility training;Balance training    PT Goals (Current goals can be found in the Care Plan section)  Acute Rehab PT Goals Patient Stated Goal: to return home PT Goal Formulation: With patient Time For Goal Achievement: 09/29/21 Potential to Achieve Goals: Good    Frequency Min 2X/week     Co-evaluation               AM-PAC PT "6 Clicks" Mobility  Outcome Measure Help needed turning from your back to your side while in a flat bed without using bedrails?: A  Little Help needed moving from lying on your back to sitting on the side of a flat bed without using bedrails?: A Lot Help needed moving to and from a bed to a chair (including a wheelchair)?: A Lot Help needed standing up from a chair using your arms (e.g., wheelchair or bedside chair)?: A Lot Help needed to walk in hospital room?: A Lot Help needed climbing 3-5 steps with a railing? : Total 6 Click Score: 12    End of Session Equipment Utilized During Treatment: Gait belt Activity Tolerance: Patient tolerated treatment well Patient left: in bed;with nursing/sitter in room Nurse Communication: Mobility status PT Visit Diagnosis: Unsteadiness on feet (R26.81);Muscle weakness (generalized) (M62.81);Difficulty in walking, not elsewhere classified (R26.2);History of falling (Z91.81)    Time: 8527-7824 PT Time Calculation (min) (ACUTE ONLY): 26 min   Charges:   PT Evaluation $PT Eval Low Complexity: 1 Low PT Treatments $Therapeutic Exercise: 8-22 mins        Greggory Stallion, PT, DPT, GCS [phone]   Dagny Fiorentino 09/15/2021, 1:51 PM

## 2021-09-15 NOTE — ED Notes (Signed)
Pt awake.  Afib on monitor.

## 2021-09-15 NOTE — TOC Initial Note (Signed)
Transition of Care [facility]) - Initial/Assessment Note    Patient Details  Name: Barbara Frank MRN: [id number] Date of Birth: 07-18-1946  Transition of Care [employer] [facility]) CM/SW Contact:    Shelbie Hutching, RN Phone Number: 09/15/2021, 3:14 PM  Clinical Narrative:                 Patient discharged from the hospital yesterday but immediately returned to the emergency department because she could not walk when she got to her Independent living apartment at [facility].  RNCM met with patient at the bedside today, she agrees with rehab.  She has no facility preference.  RNCM will start bed search.    Expected Discharge Plan: Skilled Nursing Facility Barriers to Discharge: SNF Pending bed offer   Patient Goals and CMS Choice Patient states their goals for this hospitalization and ongoing recovery are:: to go for rehab CMS Medicare.gov Compare Post Acute Care list provided to:: Patient Choice offered to / list presented to : Patient  Expected Discharge Plan and Services Expected Discharge Plan: [place]   Discharge Planning Services: CM Consult Post Acute Care Choice: [place] Living arrangements for the past 2 months: [place]                 DME Arranged: N/A DME Agency: NA       HH Arranged: NA HH Agency: NA        Prior Living Arrangements/Services Living arrangements for the past 2 months: [place] Lives with:: Self Patient language and need for interpreter reviewed:: Yes Do you feel safe going back to the place where you live?: Yes      Need for Family Participation in Patient Care: Yes (Comment) Care giver support system in place?: No (comment) Current home services: DME Criminal Activity/Legal Involvement Pertinent to Current Situation/Hospitalization: No - Comment as needed  Activities of Daily Living      Permission Sought/Granted Permission sought to share information with : Case Manager, Family  Supports, Chartered certified accountant granted to share information with : Yes, Verbal Permission Granted     Permission granted to share info w AGENCY: SNF's        Emotional Assessment Appearance:: Appears stated age Attitude/Demeanor/Rapport: Engaged Affect (typically observed): Accepting Orientation: : Oriented to Self, Oriented to Place, Oriented to  Time, Oriented to Situation Alcohol / Substance Use: Not Applicable Psych Involvement: No (comment)  Admission diagnosis:  ems weakness Patient Active Problem List   Diagnosis Date Noted   A-fib ([place]) 09/09/2021   Constipation    Generalized weakness    Fall at home, initial encounter 07/23/2021   Lung nodule 07/23/2021   Elevated lactic acid level 07/23/2021   Prolonged QT interval 07/23/2021   Atrial fibrillation, chronic (HCC)    Weakness    Palliative care encounter    Acute cystitis with hematuria    Type 2 diabetes mellitus without complication, with long-term current use of insulin (HCC)    Increased ammonia level    Encephalopathy 03/07/2021   Transient confusion 03/06/2021   Intertrigo 03/06/2021   Encounter for antineoplastic chemotherapy 01/08/2021   Encounter for antineoplastic immunotherapy 01/08/2021   Goals of care, counseling/discussion 10/17/2020   Hypercalcemia 10/17/2020   Chest mass 10/17/2020   Atrial fibrillation with RVR ([place]) 09/26/2020   Depression 09/26/2020   Diabetes type 2, uncontrolled 09/26/2020   Hypokalemia 09/26/2020   Renal cell carcinoma ([place]) 09/26/2020   Acute delirium 09/26/2020   Ureteral stone  09/26/2020   PCP:  Merryl Hacker, No Pharmacy:   [place], [place] - [address] [place] [address] Phone: [phone] Fax: [phone]  [place] [address] Phone: [phone] Fax: [phone]     Social Determinants of Health (SDOH) Interventions    Readmission Risk  Interventions No flowsheet data found.

## 2021-09-15 NOTE — NC FL2 (Signed)
[place] LEVEL OF CARE SCREENING TOOL     IDENTIFICATION  Patient Name: Barbara Frank Birthdate: Jul 18, 1946 Sex: female Admission Date (Current Location): 09/14/2021  [facility] and [place] Number:  Engineering geologist and Address:  [facility] [address] [place], [place] [address]      Provider Number: [id number]  Attending Physician Name and Address:  No att. providers found  Relative Name and Phone Number:  [name] (niece) [phone]    Current Level of Care: Hospital Recommended Level of Care: [place] Prior Approval Number:    Date Approved/Denied:   PASRR Number: [id number]  Discharge Plan: SNF    Current Diagnoses: Patient Active Problem List   Diagnosis Date Noted   A-fib ([place]) 09/09/2021   Constipation    Generalized weakness    Fall at home, initial encounter 07/23/2021   Lung nodule 07/23/2021   Elevated lactic acid level 07/23/2021   Prolonged QT interval 07/23/2021   Atrial fibrillation, chronic (HCC)    Weakness    Palliative care encounter    Acute cystitis with hematuria    Type 2 diabetes mellitus without complication, with long-term current use of insulin (HCC)    Increased ammonia level    Encephalopathy 03/07/2021   Transient confusion 03/06/2021   Intertrigo 03/06/2021   Encounter for antineoplastic chemotherapy 01/08/2021   Encounter for antineoplastic immunotherapy 01/08/2021   Goals of care, counseling/discussion 10/17/2020   Hypercalcemia 10/17/2020   Chest mass 10/17/2020   Atrial fibrillation with RVR ([place]) 09/26/2020   Depression 09/26/2020   Diabetes type 2, uncontrolled 09/26/2020   Hypokalemia 09/26/2020   Renal cell carcinoma ([place]) 09/26/2020   Acute delirium 09/26/2020   Ureteral stone 09/26/2020    Orientation RESPIRATION BLADDER Height & Weight     Self, Situation, Time, Place  Normal Continent Weight: 73 kg Height:  5\' 2"  (157.5 cm)  BEHAVIORAL  SYMPTOMS/MOOD NEUROLOGICAL BOWEL NUTRITION STATUS      Continent Diet (Regular diet)  AMBULATORY STATUS COMMUNICATION OF NEEDS Skin   Extensive Assist Verbally Normal                       Personal Care Assistance Level of Assistance  Bathing, Feeding, Dressing Bathing Assistance: Limited assistance Feeding assistance: Independent Dressing Assistance: Limited assistance     Functional Limitations Info  Sight, Hearing, Speech Sight Info: Impaired Hearing Info: Adequate Speech Info: Adequate    SPECIAL CARE FACTORS FREQUENCY  PT (By licensed PT), OT (By licensed OT)     PT Frequency: 5 times per week OT Frequency: 5 times per week            Contractures Contractures Info: Not present    Additional Factors Info  Code Status, Allergies Code Status Info: Full Allergies Info: Kiwi extract           Current Medications (09/15/2021):  This is the current hospital active medication list Current Facility-Administered Medications  Medication Dose Route Frequency Provider Last Rate Last Admin   acetaminophen (TYLENOL) tablet 650 mg  650 mg Oral Q6H PRN Duffy Bruce, MD       apixaban [place]) tablet 5 mg  5 mg Oral BID Duffy Bruce, MD   5 mg at 09/15/21 1045   carvedilol (COREG) tablet 6.25 mg  6.25 mg Oral BID WC Duffy Bruce, MD   6.25 mg at 09/15/21 1046   diltiazem (CARDIZEM CD) 24 hr capsule 240 mg  240 mg Oral QPM Isaacs,  Lysbeth Galas, MD       escitalopram (LEXAPRO) tablet 5 mg  5 mg Oral Daily Duffy Bruce, MD   5 mg at 09/15/21 1046   insulin aspart (novoLOG) injection 0-15 Units  0-15 Units Subcutaneous TID WC Duffy Bruce, MD       insulin aspart (novoLOG) injection 4 Units  4 Units Subcutaneous TID WC Duffy Bruce, MD   4 Units at 09/14/21 1742   insulin glargine-yfgn (SEMGLEE) injection 27 Units  27 Units Subcutaneous BID Duffy Bruce, MD   27 Units at 09/15/21 1047   metFORMIN (GLUCOPHAGE-XR) 24 hr tablet 500 mg  500 mg Oral BID WC Duffy Bruce, MD   500 mg at 09/15/21 1046   Current Outpatient Medications  Medication Sig Dispense Refill   acetaminophen (TYLENOL) 325 MG tablet Take 2 tablets (650 mg total) by mouth every 6 (six) hours as needed for mild pain, fever or headache. 30 tablet 1   apixaban (ELIQUIS) 5 MG TABS tablet Take 1 tablet (5 mg total) by mouth 2 (two) times daily. 60 tablet 2   carvedilol (COREG) 6.25 MG tablet Take 1 tablet (6.25 mg total) by mouth 2 (two) times daily with a meal. 60 tablet 2   cetirizine (ZYRTEC) 10 MG tablet Take 0.5 tablets (5 mg total) by mouth daily. 15 tablet 0   diltiazem (CARDIZEM CD) 240 MG 24 hr capsule Take 1 capsule (240 mg total) by mouth every evening. 30 capsule 2   escitalopram (LEXAPRO) 5 MG tablet Take 1 tablet (5 mg total) by mouth daily. 30 tablet 0   insulin glargine (LANTUS) 100 UNIT/ML injection Inject 0.27 mLs (27 Units total) into the skin 2 (two) times daily. 10 mL 11   metFORMIN (GLUCOPHAGE-XR) 500 MG 24 hr tablet Take 1 tablet (500 mg total) by mouth 2 (two) times daily. 60 tablet 2   Multiple Vitamin (MULTIVITAMIN WITH MINERALS) TABS tablet Take 1 tablet by mouth daily. 30 tablet 2   polyethylene glycol (MIRALAX / GLYCOLAX) 17 g packet Take 17 g by mouth daily. Hold if loose stools or frequent BMs 14 each 0   feeding supplement, GLUCERNA SHAKE, (GLUCERNA SHAKE) LIQD Take 237 mLs by mouth 3 (three) times daily between meals. 21330 mL 2   insulin aspart (NOVOLOG) 100 UNIT/ML injection Inject 0-15 Units into the skin 3 (three) times daily with meals. CBG 70 - 120: 0 units,  121 - 150: 2 units, 151 - 200: 3 units,  201 - 250: 5 units,  251 - 300: 8 units, 301 - 350: 11 units 351 - 400: 15 units, CBG > 400: call MD and obtain STAT lab verification 10 mL 11   insulin aspart (NOVOLOG) 100 UNIT/ML injection Inject 4 Units into the skin 3 (three) times daily with meals. 10 mL 0     Discharge Medications: Please see discharge summary for a list of discharge  medications.  Relevant Imaging Results:  Relevant Lab Results:   Additional Information SSN: [id number]  Shelbie Hutching, RN

## 2021-09-15 NOTE — ED Notes (Signed)
Afib on monitor at 90.

## 2021-09-15 NOTE — ED Notes (Signed)
Pt's O2 at 90%, Dorian RN placed pt on 2L [place] at this time with O2 now at 96%. Pt denies any needs at this time.

## 2021-09-15 NOTE — ED Notes (Signed)
Meds given. Pt awake and talking  afib on monitor.

## 2021-09-15 NOTE — Progress Notes (Addendum)
Inpatient Diabetes Program Recommendations  AACE/ADA: New Consensus Statement on Inpatient Glycemic Control   Target Ranges:  Prepandial:   less than 140 mg/dL      Peak postprandial:   less than 180 mg/dL (1-2 hours)      Critically ill patients:  140 - 180 mg/dL    Latest Reference Range & Units 09/14/21 08:30 09/14/21 11:50 09/14/21 17:23 09/15/21 00:21 09/15/21 09:41  Glucose-Capillary 70 - 99 mg/dL 252 (H) 318 (H) 214 (H) 239 (H) 255 (H)  (H): Data is abnormally high Review of Glycemic Control  Diabetes history: DM2 Outpatient Diabetes medications: Lantus 27 units BID, Metformin XR 500 mg BID, Novolog 0-15 units TID with meals, Novolog 4 units with meals for meal coverage  Current orders for Inpatient glycemic control: Semglee 27 units BID, Novolog 4 units TID with meals, Novolog 0-15 units TID with meals, Metformin XR 500 mg BID  Inpatient Diabetes Program Recommendations:    Insulin: Please consider increasing Semglee to 29 units BID and meal coverage to Novolog 7 units TID with meals if patient eats at least 50% of meals.  Diet: Please change diet from Regular to Carb Modified.  NOTE: Noted patient was inpatient 09/09/21-09/14/21, discharge summary at 10:54 am on 09/14/21. Per Triage note on 09/14/21@14 :02 "Pt to ED [facility] from [facility] for placement. Reports pt was d/c from admission a couple hours ago, cannot stay at [facility]."  Thanks, Barnie Alderman, RN, MSN, CDE Diabetes Coordinator Inpatient Diabetes Program [phone] (Team Pager from 8am to 5pm)

## 2021-09-15 NOTE — Evaluation (Signed)
Occupational Therapy Evaluation Patient Details Name: Barbara Frank MRN: [id number] DOB: August 10, 1946 Today's Date: 09/15/2021   History of Present Illness Pt admitted for weakness and reports she recently was discharged yesterday to [facility]. History includes DM, Afib, depression, renal cell carcinoma, and had recent fall.   Clinical Impression   Barbara Frank presents with generalized weakness, limited endurance, impaired balance, fatigue, fear of falling. She has been living for the past year at [facility], which provides meals and housekeeping assistance but requires residents to be able to manage their own ADL and to ambulate to a central dining hall. Barbara Frank reports that, upon returning home to [facility] following hospital DC, she found herself unable to get herself to/from bathroom and dining hall. During today's evaluation, she is very weak, unable to come to EOB sitting without Mod A. She is unable to open containers on her food tray. Although able to provide date, location, situation, she requires increased processing time to respond to questions/directions, and appears somewhat confused, with flat affect. Recommend ongoing OT while hospitalized to address strength, balance, endurance, as well as DC to SNF to assist with return to PLOF before returning to ALF.   Recommendations for follow up therapy are one component of a multi-disciplinary discharge planning process, led by the attending physician.  Recommendations may be updated based on patient status, additional functional criteria and insurance authorization.   Follow Up Recommendations  Skilled nursing-short term rehab (<3 hours/day)    Assistance Recommended at Discharge Frequent or constant Supervision/Assistance  Patient can return home with the following A little help with walking and/or transfers;Assistance with cooking/housework;Assist for transportation;A lot of help with bathing/dressing/bathroom     Functional Status Assessment  Patient has had a recent decline in their functional status and demonstrates the ability to make significant improvements in function in a reasonable and predictable amount of time.  Equipment Recommendations  None recommended by OT    Recommendations for Other Services       Precautions / Restrictions Precautions Precautions: Fall Precaution Comments: Mod fall risk Restrictions Weight Bearing Restrictions: No      Mobility Bed Mobility Overal bed mobility: Needs Assistance Bed Mobility: Supine to Sit;Sit to Supine     Supine to sit: Mod assist Sit to supine: Mod assist   General bed mobility comments: Mod A for laterally moving, elevating b/l LE    Transfers Overall transfer level: Needs assistance Equipment used: Rolling walker (2 wheels) Transfers: Sit to/from Stand Sit to Stand: Mod assist           General transfer comment: Pt fearful of falling, reports she is too tired for any sustained OOB activity      Balance Overall balance assessment: Needs assistance;History of Falls Sitting-balance support: Feet supported Sitting balance-Leahy Scale: Fair     Standing balance support: Bilateral upper extremity supported Standing balance-Leahy Scale: Zero Standing balance comment: pt declines, citing fatigue, "discomfort" with moving                           ADL either performed or assessed with clinical judgement   ADL Overall ADL's : Needs assistance/impaired Eating/Feeding: Minimal assistance Eating/Feeding Details (indicate cue type and reason): unable to open any of food containers on her lunch tray.  Functional mobility during ADLs: Moderate assistance General ADL Comments: Mod A w/ RW for OOB mobility     Vision Patient Visual Report: No change from baseline       Perception     Praxis      Pertinent Vitals/Pain Pain Assessment: Faces Faces Pain Scale:  Hurts little more Pain Location: b/l LE, back Pain Descriptors / Indicators: Discomfort;Sore Pain Intervention(s): Limited activity within patient's tolerance;Repositioned     Hand Dominance Right   Extremity/Trunk Assessment Upper Extremity Assessment Upper Extremity Assessment: Generalized weakness   Lower Extremity Assessment Lower Extremity Assessment: Generalized weakness   Cervical / Trunk Assessment Cervical / Trunk Assessment: Normal   Communication Communication Communication: No difficulties   Cognition Arousal/Alertness: Awake/alert;Lethargic Behavior During Therapy: WFL for tasks assessed/performed Overall Cognitive Status: Within Functional Limits for tasks assessed                                 General Comments: A&O x 4, although drowsy. Reports has not been able to sleep since coming to ER     General Comments       Exercises Exercises: Other exercises Other Exercises Other Exercises: supine ther-ex performed on B LE including LAQ, alt marching. L LE appears weaker compared to R LE. Fatigues rapidly with exertion Other Exercises: Educ re: role of OT, goals of care, DC recs.   Shoulder Instructions      Home Living Family/patient expects to be discharged to:: Assisted living Living Arrangements:  (from ALF)   Type of Home: Assisted living Home Access: Level entry     Home Layout: One level               Home Equipment: Rollator (4 wheels)   Additional Comments: Patient reports she lives at [facility] assisted living facility      Prior Functioning/Environment Prior Level of Function : Independent/Modified Independent             Mobility Comments: reports she uses rollator at baseline and has typically been very independent with mobility until yesterday when she couldn't ambulate ADLs Comments: Pt reports Ind for walk-in shower and dressing. She ambulates to community dining room for meals.        OT Problem  List: Decreased strength;Decreased activity tolerance;Impaired balance (sitting and/or standing);Decreased safety awareness;Decreased knowledge of use of DME or AE      OT Treatment/Interventions: Self-care/ADL training;Therapeutic exercise;Therapeutic activities;Cognitive remediation/compensation;DME and/or AE instruction;Manual therapy;Balance training;Patient/family education    OT Goals(Current goals can be found in the care plan section) Acute Rehab OT Goals Patient Stated Goal: to be stronger OT Goal Formulation: With patient Time For Goal Achievement: 09/29/21 Potential to Achieve Goals: Good ADL Goals Pt Will Perform Grooming: with modified independence;standing Pt Will Perform Lower Body Dressing: with modified independence;sit to/from stand Pt Will Transfer to Toilet: with modified independence;stand pivot transfer (using LRAD)  OT Frequency: Min 2X/week    Co-evaluation              AM-PAC OT "6 Clicks" Daily Activity     Outcome Measure Help from another person eating meals?: A Little Help from another person taking care of personal grooming?: A Little Help from another person toileting, which includes using toliet, bedpan, or urinal?: A Lot Help from another person bathing (including washing, rinsing, drying)?: A Little Help from another person to put on and taking off regular upper body clothing?: A Little Help  from another person to put on and taking off regular lower body clothing?: A Little 6 Click Score: 17   End of Session    Activity Tolerance: Patient tolerated treatment well Patient left: in bed;with bed alarm set  OT Visit Diagnosis: Unsteadiness on feet (R26.81);Repeated falls (R29.6);Muscle weakness (generalized) (M62.81)                Time: 9373-4287 OT Time Calculation (min): 13 min Charges:  OT General Charges $OT Visit: 1 Visit OT Evaluation $OT Eval Low Complexity: 1 Low Josiah Lobo, PhD, MS, OTR/L 09/15/21, 3:52 PM

## 2021-09-15 NOTE — ED Notes (Signed)
Pt resting quietly.  Pt in hallway bed

## 2021-09-16 LAB — CBG MONITORING, ED
Glucose-Capillary: 140 mg/dL — ABNORMAL HIGH (ref 70–99)
Glucose-Capillary: 234 mg/dL — ABNORMAL HIGH (ref 70–99)

## 2021-09-16 MED ORDER — INSULIN ASPART 100 UNIT/ML IJ SOLN
0.0000 [IU] | Freq: Three times a day (TID) | INTRAMUSCULAR | Status: DC
  Administered 2021-09-16: 5 [IU] via SUBCUTANEOUS
  Filled 2021-09-16: qty 1

## 2021-09-16 MED ORDER — INSULIN ASPART 100 UNIT/ML IJ SOLN: 0.0000 [IU] | Freq: Three times a day (TID) | INTRAMUSCULAR | Status: DC

## 2021-09-16 NOTE — ED Notes (Signed)
Call X 2 to give report, transferred and no answer.

## 2021-09-16 NOTE — ED Notes (Signed)
EMS to pick up patient to take to [facility]. Patient aware of plan. Paperwork given to EMS.

## 2021-09-16 NOTE — TOC Progression Note (Signed)
Transition of Care [facility]) - Progression Note    Patient Details  Name: Barbara Frank MRN: [id number] Date of Birth: 04-Jan-1946  [facility] CM/SW Contact  Shelbie Hutching, RN Phone Number: 09/16/2021, 10:48 AM  Clinical Narrative:    Bed offers presented to patient, she accepts bed offer from [facility] to see if they can accept today.  Insurance auth approved through [employer].  [employer] Reference # [id number] approved from 1/11-1/13.    Expected Discharge Plan: Skilled Nursing Facility Barriers to Discharge: SNF Pending bed offer  Expected Discharge Plan and Services Expected Discharge Plan: [place]   Discharge Planning Services: CM Consult Post Acute Care Choice: [place] Living arrangements for the past 2 months: [place]                 DME Arranged: N/A DME Agency: NA       HH Arranged: NA HH Agency: NA         Social Determinants of Health (SDOH) Interventions    Readmission Risk Interventions No flowsheet data found.

## 2021-09-16 NOTE — ED Notes (Signed)
Pt incontinent of urine; pericare given, brief changed and new linens placed on bed. Purewick applied.

## 2021-09-16 NOTE — Progress Notes (Signed)
Physical Therapy Treatment Patient Details Name: Barbara Frank MRN: [id number] DOB: 07/16/1946 Today's Date: 09/16/2021   History of Present Illness Pt admitted for weakness and reports she recently was discharged yesterday to [facility]. History includes DM, Afib, depression, renal cell carcinoma, and had recent fall.    PT Comments    Pt is making limited progress towards goals with ability to perform supine there-ex and limited OOB mobility. Pt very fearful of falling, however is able to take several small side steps up towards [facility] for repositioning. Pt is excited to be going to SNF and still remains off baseline. Will continue to progress. Encouraged to continue to perform supine there-ex.   Recommendations for follow up therapy are one component of a multi-disciplinary discharge planning process, led by the attending physician.  Recommendations may be updated based on patient status, additional functional criteria and insurance authorization.  Follow Up Recommendations  Skilled nursing-short term rehab (<3 hours/day)     Assistance Recommended at Discharge Frequent or constant Supervision/Assistance  Patient can return home with the following Two people to help with walking and/or transfers;A lot of help with bathing/dressing/bathroom   Equipment Recommendations  None recommended by PT    Recommendations for Other Services       Precautions / Restrictions Precautions Precautions: Fall Restrictions Weight Bearing Restrictions: No     Mobility  Bed Mobility Overal bed mobility: Needs Assistance Bed Mobility: Supine to Sit;Sit to Supine     Supine to sit: Mod assist Sit to supine: Mod assist   General bed mobility comments: needs assist for trunkal elevation and B LEs management. Once seated at EOB, upright posture noted    Transfers Overall transfer level: Needs assistance Equipment used: 1 person hand held assist Transfers: Sit to/from Stand Sit to Stand: Mod  assist           General transfer comment: improved technique with ability to follow commands well. Pushing from seated surface, upright posture. Fatigues quickly only able to maintain standing x 10 seconds    Ambulation/Gait Ambulation/Gait assistance: Mod assist Gait Distance (Feet): 2 Feet Assistive device: 1 person hand held assist Gait Pattern/deviations: Step-to pattern       General Gait Details: small steps up towards [facility] for repositioning. Not able to take steps away from bed   Stairs             Wheelchair Mobility    Modified Rankin (Stroke Patients Only)       Balance Overall balance assessment: Needs assistance;History of Falls Sitting-balance support: Feet supported Sitting balance-Leahy Scale: Fair       Standing balance-Leahy Scale: Poor Standing balance comment: Requires B HHA                            Cognition Arousal/Alertness: Awake/alert Behavior During Therapy: WFL for tasks assessed/performed Overall Cognitive Status: Within Functional Limits for tasks assessed                                 General Comments: sleeping, however easily awaken and pleasant        Exercises Other Exercises Other Exercises: supine ther-ex performed on B LE including quad sets, hip abd/add, heel slides, SLRs, and LAQ. 10 reps with rest breaks between each exercise. Min assist required    General Comments        Pertinent Vitals/Pain Pain Assessment: No/denies  pain Pain Location: reports slight joint stiffness in B knees    Home Living                          Prior Function            PT Goals (current goals can now be found in the care plan section) Acute Rehab PT Goals Patient Stated Goal: to go to SNF PT Goal Formulation: With patient Time For Goal Achievement: 09/29/21 Potential to Achieve Goals: Good Progress towards PT goals: Progressing toward goals    Frequency    Min 2X/week       PT Plan Current plan remains appropriate    Co-evaluation              AM-PAC PT "6 Clicks" Mobility   Outcome Measure  Help needed turning from your back to your side while in a flat bed without using bedrails?: A Little Help needed moving from lying on your back to sitting on the side of a flat bed without using bedrails?: A Lot Help needed moving to and from a bed to a chair (including a wheelchair)?: A Lot Help needed standing up from a chair using your arms (e.g., wheelchair or bedside chair)?: A Lot Help needed to walk in hospital room?: A Lot Help needed climbing 3-5 steps with a railing? : Total 6 Click Score: 12    End of Session Equipment Utilized During Treatment: Gait belt Activity Tolerance: Patient tolerated treatment well Patient left: in bed;with nursing/sitter in room Nurse Communication: Mobility status PT Visit Diagnosis: Unsteadiness on feet (R26.81);Muscle weakness (generalized) (M62.81);Difficulty in walking, not elsewhere classified (R26.2);History of falling (Z91.81)     Time: 1350-1415 PT Time Calculation (min) (ACUTE ONLY): 25 min  Charges:  $Therapeutic Exercise: 23-37 mins                     Greggory Stallion, PT, DPT, GCS [phone]    Barbara Frank 09/16/2021, 3:02 PM

## 2021-09-16 NOTE — ED Notes (Signed)
Attempt to call report X 1, no answer.

## 2021-09-16 NOTE — ED Notes (Signed)
Report to [facility] at facility. Last BM 1/8 in chart. 1/9 per patient report.

## 2021-09-16 NOTE — ED Notes (Signed)
Meal tray given to patient.

## 2021-09-16 NOTE — ED Notes (Signed)
Pt sleeping. A fib on monitor.

## 2021-09-16 NOTE — ED Notes (Signed)
PT at bedside working with patient.

## 2021-09-16 NOTE — ED Notes (Signed)
Concerns due to accepting patient in regards to undocumented last BM date. Pt states that she had one 09/14/21

## 2021-09-16 NOTE — ED Notes (Signed)
Ate 100% of lunch

## 2021-09-16 NOTE — ED Notes (Signed)
Patient provided snack at appropriate snack time.  Pt consumed 100% of snack provided, tolerated well w/o complaints   Trash disposted of appropriately by patient.  

## 2021-09-16 NOTE — TOC Transition Note (Signed)
Transition of Care [facility]) - CM/SW Discharge Note   Patient Details  Name: Barbara Frank MRN: [id number] Date of Birth: [birth year]  [facility] of [facility]) CM/SW Contact:  Shelbie Hutching, RN Phone Number: 09/16/2021, 1:41 PM   Clinical Narrative:    Patient has a bed over at [place].  ED RN will call report to [phone].  ED secretary will set up EMS.     Final next level of care: [place] Barriers to Discharge: Barriers Resolved   Patient Goals and CMS Choice Patient states their goals for this hospitalization and ongoing recovery are:: Patient chooses [facility] CMS Medicare.gov Compare Post Acute Care list provided to:: Patient Choice offered to / list presented to : Patient  Discharge Placement   Existing PASRR number confirmed : 09/15/21          Patient chooses bed at: [facility] Patient to be transferred to facility by: [facility]   Patient and family notified of of transfer: 09/16/21  Discharge Plan and Services   Discharge Planning Services: CM Consult Post Acute Care Choice: [place]          DME Arranged: N/A DME Agency: NA       HH Arranged: NA HH Agency: NA        Social Determinants of Health (SDOH) Interventions     Readmission Risk Interventions No flowsheet data found.

## 2021-09-16 NOTE — ED Provider Notes (Signed)
----------------------------------------- °  5:51 AM on 09/16/2021 -----------------------------------------   Blood pressure 125/69, pulse 84, temperature 98.9 F (37.2 C), temperature source Oral, resp. rate 16, height 5\' 2"  (1.575 m), weight 73 kg, SpO2 96 %.  The patient is calm and cooperative at this time.  There have been no acute events since the last update.  Awaiting disposition plan from social worker.   Paulette Blanch, MD 09/16/21 [phone]

## 2021-09-16 NOTE — ED Notes (Signed)
Lunch eaten

## 2021-10-08 ENCOUNTER — Ambulatory Visit: Payer: Medicare PPO | Admitting: Oncology

## 2021-10-08 ENCOUNTER — Other Ambulatory Visit: Payer: Medicare PPO

## 2021-10-09 ENCOUNTER — Other Ambulatory Visit: Payer: Self-pay

## 2021-10-09 ENCOUNTER — Inpatient Hospital Stay: Payer: Medicare PPO | Admitting: Oncology

## 2021-10-09 ENCOUNTER — Inpatient Hospital Stay: Payer: Medicare PPO | Attending: Oncology

## 2021-10-09 ENCOUNTER — Encounter: Payer: Self-pay | Admitting: Oncology

## 2021-10-09 VITALS — BP 168/95 | HR 85 | Temp 96.8°F | Wt 151.6 lb

## 2021-10-09 DIAGNOSIS — C641 Malignant neoplasm of right kidney, except renal pelvis: Secondary | ICD-10-CM | POA: Diagnosis present

## 2021-10-09 DIAGNOSIS — Z91199 Patient's noncompliance with other medical treatment and regimen due to unspecified reason: Secondary | ICD-10-CM

## 2021-10-09 DIAGNOSIS — Z794 Long term (current) use of insulin: Secondary | ICD-10-CM | POA: Insufficient documentation

## 2021-10-09 DIAGNOSIS — I4891 Unspecified atrial fibrillation: Secondary | ICD-10-CM | POA: Insufficient documentation

## 2021-10-09 DIAGNOSIS — R739 Hyperglycemia, unspecified: Secondary | ICD-10-CM | POA: Diagnosis not present

## 2021-10-09 DIAGNOSIS — E1165 Type 2 diabetes mellitus with hyperglycemia: Secondary | ICD-10-CM | POA: Diagnosis not present

## 2021-10-09 DIAGNOSIS — F32A Depression, unspecified: Secondary | ICD-10-CM | POA: Insufficient documentation

## 2021-10-09 DIAGNOSIS — Z7901 Long term (current) use of anticoagulants: Secondary | ICD-10-CM | POA: Diagnosis not present

## 2021-10-09 DIAGNOSIS — C649 Malignant neoplasm of unspecified kidney, except renal pelvis: Secondary | ICD-10-CM

## 2021-10-09 DIAGNOSIS — Z5112 Encounter for antineoplastic immunotherapy: Secondary | ICD-10-CM | POA: Insufficient documentation

## 2021-10-09 DIAGNOSIS — R918 Other nonspecific abnormal finding of lung field: Secondary | ICD-10-CM | POA: Insufficient documentation

## 2021-10-09 DIAGNOSIS — Z9114 Patient's other noncompliance with medication regimen: Secondary | ICD-10-CM | POA: Diagnosis not present

## 2021-10-09 DIAGNOSIS — Z79899 Other long term (current) drug therapy: Secondary | ICD-10-CM | POA: Insufficient documentation

## 2021-10-09 LAB — COMPREHENSIVE METABOLIC PANEL
ALT: 15 U/L (ref 0–44)
AST: 18 U/L (ref 15–41)
Albumin: 4 g/dL (ref 3.5–5.0)
Alkaline Phosphatase: 113 U/L (ref 38–126)
Anion gap: 5 (ref 5–15)
BUN: 11 mg/dL (ref 8–23)
CO2: 30 mmol/L (ref 22–32)
Calcium: 10.4 mg/dL — ABNORMAL HIGH (ref 8.9–10.3)
Chloride: 100 mmol/L (ref 98–111)
Creatinine, Ser: 0.55 mg/dL (ref 0.44–1.00)
GFR, Estimated: >60 mL/min (ref >60)
Glucose, Bld: 279 mg/dL — ABNORMAL HIGH (ref 70–99)
Potassium: 4.2 mmol/L (ref 3.5–5.1)
Sodium: 135 mmol/L (ref 135–145)
Total Bilirubin: 0.5 mg/dL (ref 0.3–1.2)
Total Protein: 7.4 g/dL (ref 6.5–8.1)

## 2021-10-09 LAB — CBC WITH DIFFERENTIAL/PLATELET
Abs Immature Granulocytes: 0.02 10*3/uL (ref 0.00–0.07)
Basophils Absolute: 0 10*3/uL (ref 0.0–0.1)
Basophils Relative: 1 %
Eosinophils Absolute: 0.1 10*3/uL (ref 0.0–0.5)
Eosinophils Relative: 3 %
HCT: 42 % (ref 36.0–46.0)
Hemoglobin: 13.3 g/dL (ref 12.0–15.0)
Immature Granulocytes: 0 %
Lymphocytes Relative: 25 %
Lymphs Abs: 1.4 10*3/uL (ref 0.7–4.0)
MCH: 27.4 pg (ref 26.0–34.0)
MCHC: 31.7 g/dL (ref 30.0–36.0)
MCV: 86.4 fL (ref 80.0–100.0)
Monocytes Absolute: 0.5 10*3/uL (ref 0.1–1.0)
Monocytes Relative: 9 %
Neutro Abs: 3.4 10*3/uL (ref 1.7–7.7)
Neutrophils Relative %: 62 %
Platelets: 193 10*3/uL (ref 150–400)
RBC: 4.86 MIL/uL (ref 3.87–5.11)
RDW: 14.6 % (ref 11.5–15.5)
WBC: 5.5 10*3/uL (ref 4.0–10.5)
nRBC: 0 % (ref 0.0–0.2)

## 2021-10-09 LAB — TSH: TSH: 2.561 u[IU]/mL (ref 0.350–4.500)

## 2021-10-09 NOTE — Progress Notes (Signed)
Hematology/Oncology Progress note [phone] [phone]      Patient Care Team: Pcp, No as PCP - General [name], MD as Consulting Physician (Hematology and Oncology)  REFERRING PROVIDER: No ref. provider found  CHIEF COMPLAINTS/REASON FOR VISIT:  Metastatic RCC  HISTORY OF PRESENTING ILLNESS:   Patient is a poor historian. Her mood appears to be depressed.  She currently lives at assisted living facility. Moved from [place]. Widowed 3 years ago.  Her power of attorney is [name] who is her husband's niece. [name] was called during this encounter.   Extensive medical record review was performed. She has had multiple images done during the past few months, 05/27/20 CT head wo contrast- no acute intracranial abnormality.  06/01/20 CT abdomen and pelvis with contrast 4 mm proximal left ureteral calculus causing mild proximal left- sided hydroureteronephrosis with periureteric and perinephric stranding on the left.5.2cm enhancing right mid anterior kidney mass. Mildly enlarged right retroperitoneal retrocaval lymph nodes suspicious for nodal metastases Mild cardiomegaly, Small hiatal hernia. Status post gastric bypass surgery and cholecystectomy. Postoperative changes anterior abdominal wall. 06/05/20 CT head wo contrast - No acute intracranial abnormality. Probable tiny right frontal scalp Contusion. 2. Mild global volume loss and minimal chronic ischemic small vessel disease. 06/06/20 CT abdomen pelvis wo contrast 1. Indeterminate lesion in the right kidney which is stable may be a solid mass., 2. Partially calcified uterine fibroids. 3. Left UVJ calculus measuring 5 mm 06/07/20 CT head wo contrast.  1. No acute intracranial abnormality. No significant change. 2. Intracranial atherosclerosis, atrophy and chronic small vessel ischemic changes. 3. Chronic/subacute right maxillary sinus disease.  06/07/20 Cystoscopy by [name] for Ureteroscopic stone  extraction Admitted on 10/3/21due to failure to thrive,  anorexia, dehydration, electrolyte imbalances.   06/09/20 CT abdomen and chest with contrast 1. Right renal mass is stable in size. Most suggestive of a renal neoplasm. 2. Right retroperitoneal lymph nodes are stable in size; may well be metastatic. 3. Left hydronephrosis and hydroureter is stable. Pelvis is not imaged therefore if there is a distal calculus still present, it is not visible. 4. Sclerotic lesions in the anterior left seventh, eighth and ninth ribs which given distribution are most likely healed rib fractures.  She is on Eliquis 40m Bid for atrial fibrillation. She reports not taking anti depressants that were given by her PCP.  #11/04/2020, PET scan was obtained. PET scan showed interval increasing size of right upper lobe pulmonary lesion, now 3 cm with an SUV of 3.8.  Associated with a new right lower lobe nodule 8 mm.  5.1 cm right renal mass hypermetabolic activity.  1.9 retrocaval lymph node with SUV of 4.8.  Second index retrocaval node with SUV of 2.  Nodular disease seen in the duodenum on previous study is not well demonstrated on noncontrast CT image today.  Some nodular FDG activity, nonspecific. Multiple anterior abdominal soft tissue nodules are evident.  13 mm anterior left nodule shows no substantial hypermetabolic with an SUV of 1.7.  Calcified left lateral nodule with an SUV of 2.9.  2.2 cm calcified nodule lobe right paracolic gutter with an SUV of 2.5.  12/17/2020, right kidney mass biopsy pathology was reviewed and discussed with patient.  Pathology is consistent with RCC-demonstrates the morphology spectrum, papillary, solid and nested areas.  CD10 positive, racemase positive, CK7 negative.  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  TFE 3 break apart FISH testing is pending.  #01/05/2021, addendum pathology TFE3 break-apart  FISH testing was performed and a translocation  involving  Xp11 / TFE3 transcription gene was not detected. HMB-45 IHC is negative  CAIX IHC demonstrates membranous staining.  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  MRI brain was obtained during the admission and was negative for acute process.  Patient mental status improved.  UTI was treated with IV antibiotics and switched to p.o. antibiotic upon discharge.  Patient also was found to have rapid ventricular response with her atrial fibrillation.  Rate was treated with Cardizem drip during hospitalization because and switched to Toprol and Cardizem for rate control.  Eliquis for anticoagulation. Patient's axitinib was held during hospitalization. There was plan for patient to restart axitinib upon discharge.  However since patient was sent to rehab, he is not able to be started on axitinib due to lack of access to her medication supply.  [facility]  Patient presents for post hospitalization follow-up.  She does not have much recollection of her recent admission.  Reports feeling well today.  He denies any fever, chills, nausea vomiting diarrhea.  03/07/2021, CT abdomen pelvis  Right renal mass slightly enlarged since 10/03/2020.  5.4 x 4.9 x 6.4 comparing to previously 5.0 x 4.4 x 6.3.  Stable adjacent right retroperitoneal lymphadenopathy.  There is a new 1.5 cm nodule in the right lower lobe.  Mild stranding around the right renal mass.  Hospitalized between 03/06/2021-03/16/2021 due to acute delirium from UTI.  [facility] [place], CT chest abdomen pelvis showed stable large right anterior renal mass.  Stable  to slightly larger right-sided retroperitoneal adenopathy.  Waxing and waning pulmonary nodules.  2 lower lobe nodules resolved.  New right middle lobe and right upper lobe nodules.   INTERVAL HISTORY LyEvetta Renners a [age].o. female who has above history reviewed by me today presents for follow up visit for management of right renal clear-cell carcinoma Patient presents to reestablish care.  Immunotherapy was held due to patient's noncompliance with medications and exacerbation of her other medical problems. Patient has had multiple hospitalization during the interval 07/23/2021 - 07/31/2021, hospitalized due to A. fib with RVR. 09/09/2021 - 09/14/2021, the patient due to A. fib with RVR, uncontrolled diabetes with glucose of 500s, DKA.  Patient presents with discussed about further management of RCC. Patient says that she tries to take her oral medication now and is interested in proceeding with additional treatments.  Review of Systems  Constitutional:  Positive for fatigue. Negative for appetite change, chills and fever.  HENT:   Negative for hearing loss and voice change.   Eyes:  Negative for eye problems.  Respiratory:  Negative for chest tightness and cough.   Cardiovascular:  Negative for chest pain.  Gastrointestinal:  Negative for abdominal distention, abdominal pain, blood in stool and constipation.  Endocrine: Negative for hot flashes.  Genitourinary:  Negative for difficulty urinating and frequency.   Musculoskeletal:  Negative for arthralgias.  Skin:  Negative for itching and rash.  Neurological:  Negative for extremity weakness.  Hematological:  Negative for adenopathy.  Psychiatric/Behavioral:  Positive for depression. Negative for confusion.    MEDICAL HISTORY:  Past Medical History:  Diagnosis Date   A-fib ([place])    Depression    Diabetes mellitus without complication ([place])    History of kidney stones    Kidney mass     SURGICAL HISTORY: Past Surgical History:   Procedure Laterality Date   GASTRIC BYPASS     HERNIA REPAIR     TONSILLECTOMY     URETEROSCOPY WITH HOLMIUM LASER LITHOTRIPSY Left     SOCIAL HISTORY: Social History   Socioeconomic History   Marital status: Widowed    Spouse name: Not on file   Number of children: Not on file   Years of education: Not on file   Highest education level: Not on file  Occupational History   Not on file  Tobacco Use   Smoking status: Never   Smokeless tobacco: Never  Vaping Use   Vaping Use: Never used  Substance and Sexual Activity   Alcohol use: Not Currently   Drug use: Not Currently   Sexual activity: Not Currently  Other Topics Concern   Not on file  Social History Narrative   Not on file   Social Determinants of Health   Financial Resource Strain: Not on file  Food Insecurity: Not on file  Transportation Needs: Not on file  Physical Activity: Not on file  Stress: Not on file  Social Connections: Not on file  Intimate Partner Violence: Not on file    FAMILY HISTORY: Family History  Problem Relation Age of Onset   Breast cancer Mother    Bladder Cancer Neg Hx    Kidney cancer Neg Hx    Prostate cancer Neg Hx     ALLERGIES:  is allergic to kiwi extract.  MEDICATIONS:  Current Outpatient Medications  Medication Sig Dispense Refill   acetaminophen (TYLENOL) 325 MG tablet Take 2 tablets (650 mg total) by mouth every 6 (six) hours as needed for mild pain, fever or headache. 30 tablet 1   apixaban (ELIQUIS) 5 MG TABS tablet Take 1 tablet (5 mg total) by mouth 2 (two) times daily. 60 tablet 2   carvedilol (COREG) 6.25 MG tablet Take 1 tablet (6.25 mg total) by mouth 2 (two) times daily with a meal. 60 tablet 2   cetirizine (ZYRTEC) 10 MG tablet Take 0.5 tablets (5 mg total) by mouth daily. 15 tablet 0   diltiazem (CARDIZEM CD) 240 MG 24 hr capsule Take 1 capsule (240 mg total) by mouth every evening. 30 capsule 2   escitalopram (LEXAPRO) 5 MG tablet Take 1 tablet (5 mg  total) by mouth daily. 30 tablet 0   feeding supplement, GLUCERNA SHAKE, (GLUCERNA SHAKE) LIQD Take 237 mLs by mouth 3 (three) times daily between meals. 21330 mL 2   insulin aspart (NOVOLOG) 100 UNIT/ML injection Inject 0-15 Units into the skin 3 (three) times daily with meals. CBG 70 - 120: 0 units,  121 - 150: 2 units, 151 - 200: 3 units,  201 - 250: 5 units,  251 - 300: 8 units, 301 - 350: 11 units 351 - 400: 15 units, CBG > 400: call MD and obtain STAT lab verification 10 mL 11   insulin aspart (NOVOLOG) 100 UNIT/ML injection Inject 4 Units into the skin 3 (three) times daily with meals. 10 mL 0   insulin glargine (LANTUS) 100 UNIT/ML injection Inject 0.27 mLs (27 Units total) into the skin 2 (two) times daily. 10 mL 11   metFORMIN (GLUCOPHAGE-XR) 500 MG 24 hr tablet Take 1 tablet (500 mg  total) by mouth 2 (two) times daily. 60 tablet 2   Multiple Vitamin (MULTIVITAMIN WITH MINERALS) TABS tablet Take 1 tablet by mouth daily. 30 tablet 2   polyethylene glycol (MIRALAX / GLYCOLAX) 17 g packet Take 17 g by mouth daily. Hold if loose stools or frequent BMs 14 each 0   No current facility-administered medications for this visit.     PHYSICAL EXAMINATION: ECOG PERFORMANCE STATUS: 2 - Symptomatic, <50% confined to bed Vitals:   10/09/21 0940  BP: (!) 168/95  Pulse: 85  Temp: (!) 96.8 F (36 C)   Filed Weights   10/09/21 0940  Weight: 151 lb 9.6 oz (68.8 kg)    Physical Exam Constitutional:      General: She is not in acute distress.    Comments: Frail elderly female, she ambulates with a walker.   HENT:     Head: Normocephalic and atraumatic.  Eyes:     General: No scleral icterus. Cardiovascular:     Rate and Rhythm: Normal rate. Rhythm irregular.     Heart sounds: Normal heart sounds.  Pulmonary:     Effort: Pulmonary effort is normal. No respiratory distress.     Breath sounds: No wheezing.  Abdominal:     General: Bowel sounds are normal. There is no distension.      Palpations: Abdomen is soft.  Musculoskeletal:        General: No deformity. Normal range of motion.     Cervical back: Normal range of motion and neck supple.  Skin:    General: Skin is warm and dry.     Findings: No erythema or rash.  Neurological:     Mental Status: She is alert and oriented to person, place, and time.     Cranial Nerves: No cranial nerve deficit.     Coordination: Coordination normal.  Psychiatric:     Comments: Depressed    LABORATORY DATA:  I have reviewed the data as listed Lab Results  Component Value Date   WBC 5.5 10/09/2021   HGB 13.3 10/09/2021   HCT 42.0 10/09/2021   MCV 86.4 10/09/2021   PLT 193 10/09/2021   Recent Labs    07/23/21 1148 07/24/21 0454 09/10/21 0500 09/11/21 0646 09/12/21 0920 09/14/21 1405 10/09/21 0924  NA 136   < > 135   < > 135 132* 135  K 4.1   < > 4.5   < > 4.5 4.5 4.2  CL 100   < > 103   < > 101 98 100  CO2 25   < > 24   < > '24 27 30  ' GLUCOSE 291*   < > 311*   < > 327* 255* 279*  BUN 12   < > 14   < > '15 17 11  ' CREATININE 0.59   < > 0.61   < > 0.48 0.41* 0.55  CALCIUM 10.3   < > 9.7   < > 10.0 10.2 10.4*  GFRNONAA >60   < > >60   < > >60 >60 >60  PROT 7.1  --  6.0*  --   --   --  7.4  ALBUMIN 3.7  --  3.0*  --   --   --  4.0  AST 20  --  12*  --   --   --  18  ALT 12  --  9  --   --   --  15  ALKPHOS 95  --  96  --   --   --  113  BILITOT 1.8*  --  1.0  --   --   --  0.5   < > = values in this interval not displayed.    Iron/TIBC/Ferritin/ %Sat No results found for: IRON, TIBC, FERRITIN, IRONPCTSAT    RADIOGRAPHIC STUDIES: I have personally reviewed the radiological images as listed and agreed with the findings in the report. No results found.    ASSESSMENT & PLAN:  1. Renal cell carcinoma of right kidney ([place])   2. Depression, unspecified depression type   3. Hyperglycemia   4. Noncompliance    #Right kidney mass with retroperitoneal lymphadenopathy, lung mass and peritoneal nodularity Labs  reviewed and discussed with patient. Had a lengthy discussion with her.  She is interested with resuming Keytruda treatments. 09/09/2021, CT angio chest PE protocol showed interval decrease of the right upper lobe nodularity.  No PE. I recommend patient to have a repeat CT chest abdomen pelvis with contrast for restaging. She will return to the clinic for reevaluation prior to starting [facility].  #Diabetes, uncontrolled.  Blood glucose level in the 200s., due to noncompliance.  Reports that she has been taking her medication.  I discussed with patient about the importance of medication compliance  I had a discussion with the patient's primary care provider Olivia Mackie who will follow-up with patient.   #Severe depression She has not establish care with psychiatry.  All questions were answered. The patient knows to call the clinic with any problems questions or concerns.  Follow up a few days after CT scan, lab MD Keytruda.  [name], MD, PhD Hematology Oncology  10/09/2021  Addendum  I have talked to patient's PCP Olivia Mackie over the phone and discussed patient's case with her. Patient is not complaint with her medication. Her diabetes was poorly controlled and glucose was extremely high, she refused to go to ER for management. She is also extremely depressed. Most recent CT scan shows stable disease. We both agree that patient is not able to take care of herself.  Olivia Mackie will try to get her to see psychiatrist for evaluation of her depression,she may benefit from living in a facility as well. From Oncology aspect, her prognosis is poor, and I will hold off additional treatment until her other acute medical conditions are stabilized. Hospice/palliative care is reasonable if her condition continues to deteriorate. Olivia Mackie will update me if she improves. [name]

## 2021-10-19 IMAGING — CT CT CHEST W/ CM
2 of 13 series · 9 of 46 positions shown, 15 images · IV contrast (omnipaque)
Comparison: 06/09/2020

CLINICAL DATA: Staging of urologic cancer, renal mass in this
74-year-old female found to have a large RIGHT renal mass.

EXAM:
CT ABDOMEN AND PELVIS WITHOUT AND CHEST, ABDOMEN, AND PELVIS WITH
CONTRAST
TECHNIQUE: Multidetector CT imaging of the abdomen and pelvis was performed
without contrast. Subsequently, CT imaging of the chest, abdomen and
pelvis was performed following the standard protocol following bolus
administration of intravenous contrast.
CONTRAST:  100mL OMNIPAQUE IOHEXOL 300 MG/ML  SOLN

[Series 5: coronal st · coronal · 0.84mm/px · 2 of 96 slices shown, 3 images]
[im 32/96  soft-tissue]
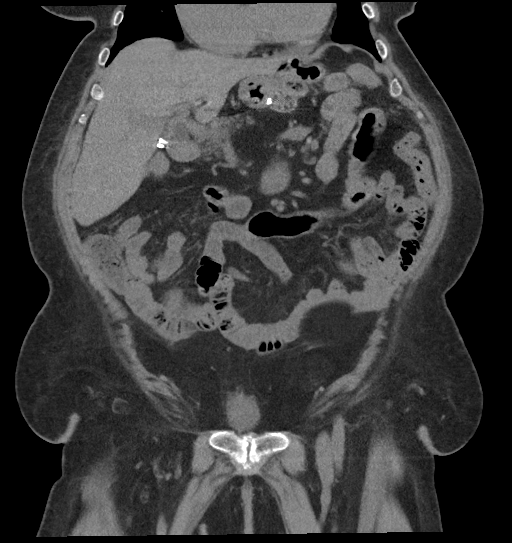
[im 32/96  bone]
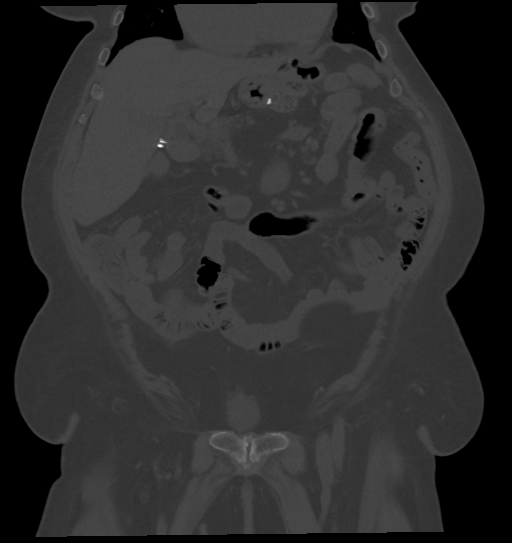
[im 64/96  soft-tissue]
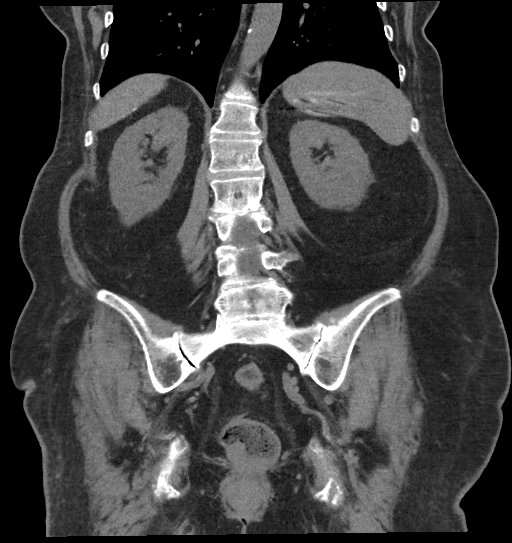

[Series 12: axial nephrographic · axial · 0.85mm/px · z∈[-889,-556]mm · 7 of 149 slices shown, 12 images]
[im 19/149  soft-tissue]
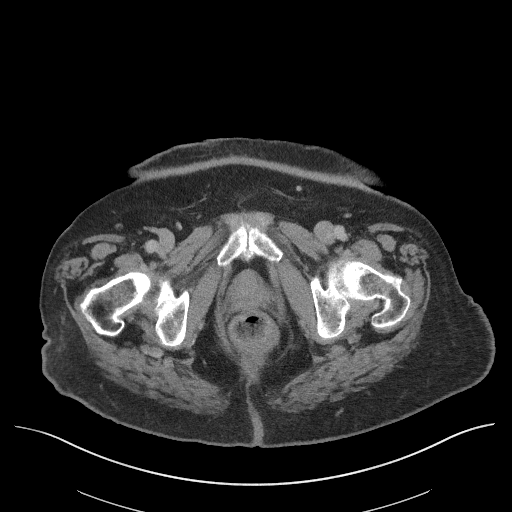
[im 19/149  bone]
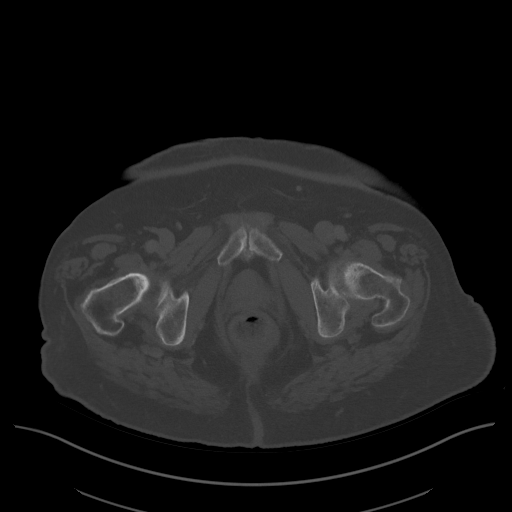
[im 38/149  soft-tissue]
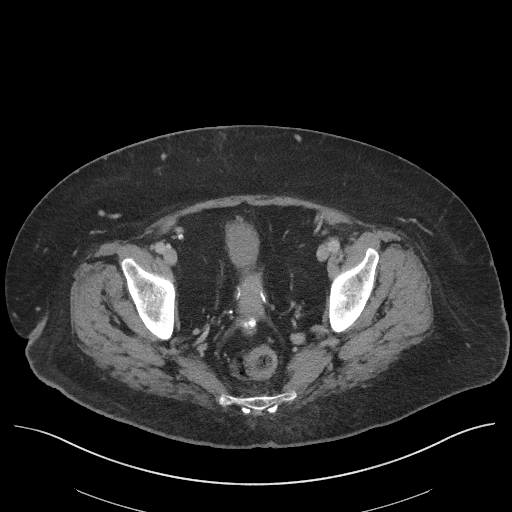
[im 56/149  soft-tissue]
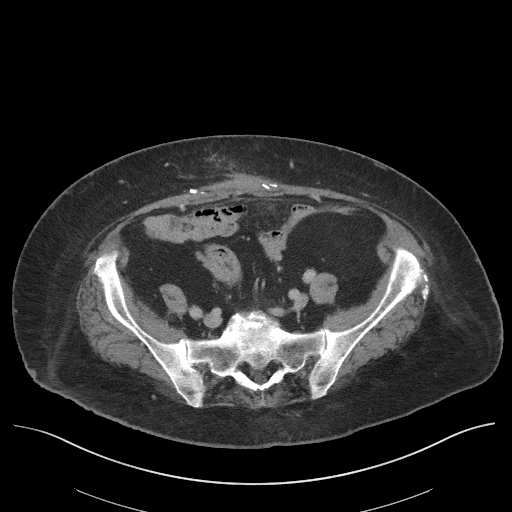
[im 75/149  soft-tissue]
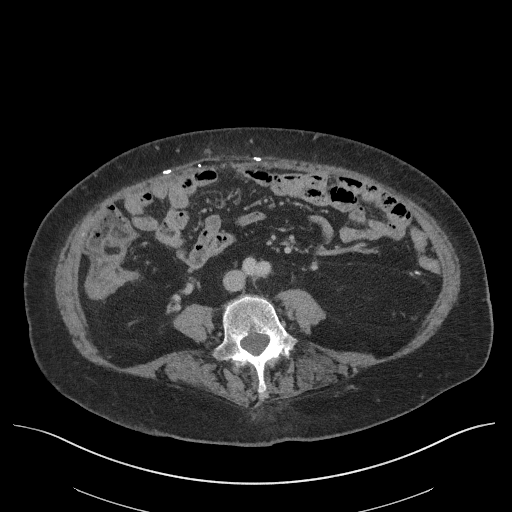
[im 75/149  lung]
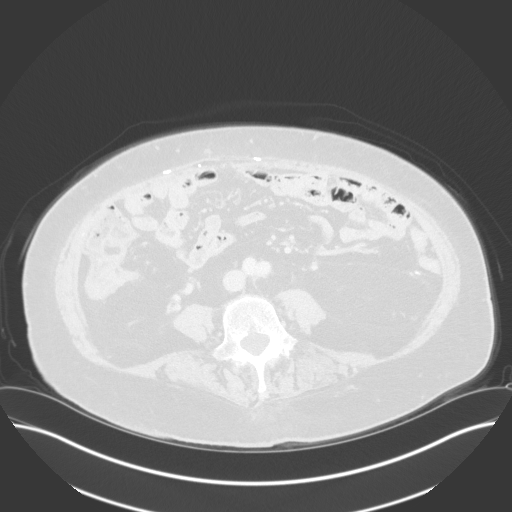
[im 93/149  soft-tissue]
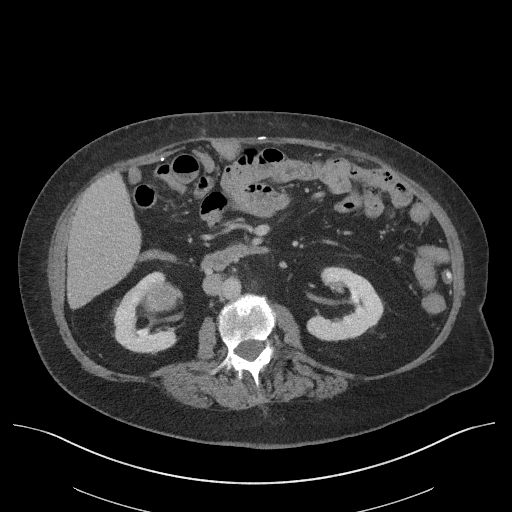
[im 93/149  lung]
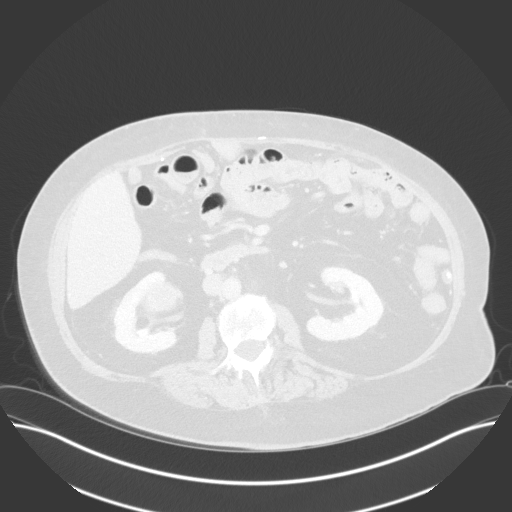
[im 112/149  soft-tissue]
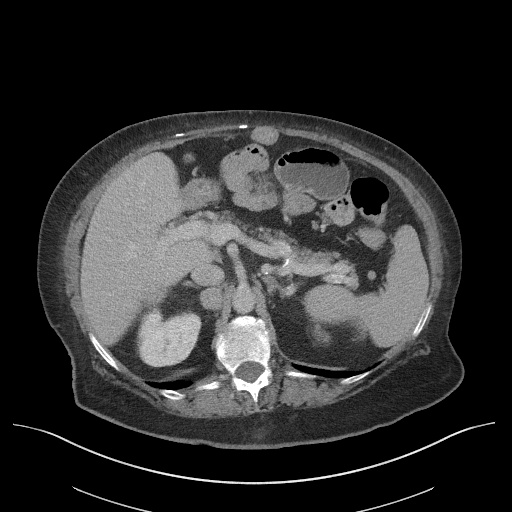
[im 112/149  lung]
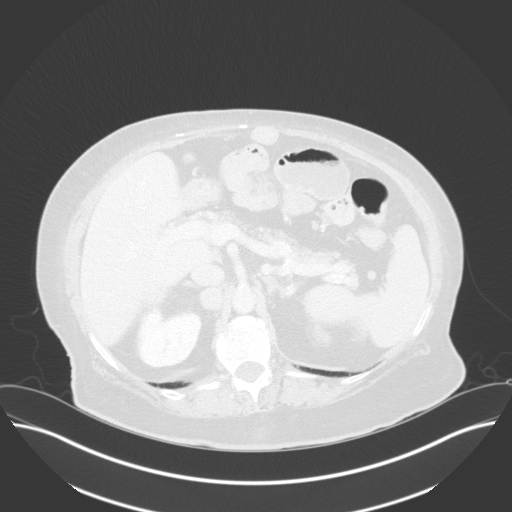
[im 130/149  soft-tissue]
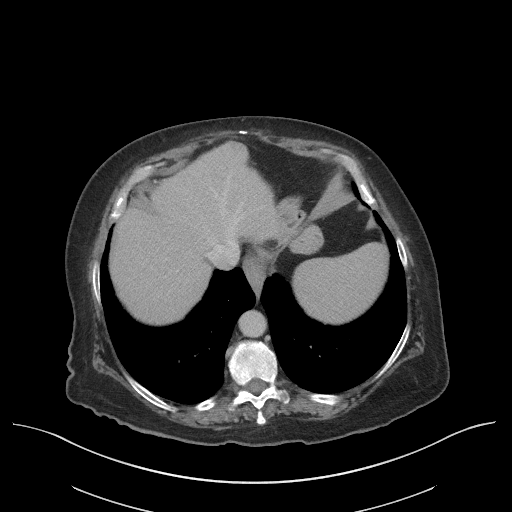
[im 130/149  lung]
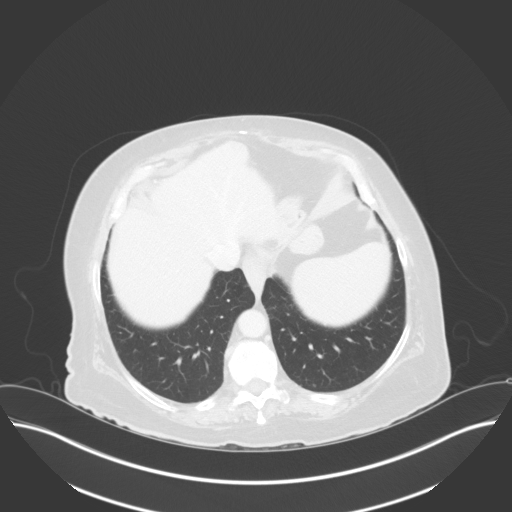

[9 of 46 positions shown; findings below may reference images not displayed]

FINDINGS: CT CHEST FINDINGS

Cardiovascular: Heart size is enlarged particularly LEFT atrium and
RIGHT heart. No pericardial effusion. Signs of coronary artery
disease of LEFT and RIGHT coronary circulation. Calcified
atheromatous plaque, noncalcified plaque mild in the thoracic aorta
which is nonaneurysmal. Central pulmonary vasculature is engorged
markedly so at 4.2 cm greatest axial dimension.

Mediastinum/Nodes: No thoracic inlet adenopathy. No axillary
lymphadenopathy. No mediastinal lymphadenopathy. No hilar
lymphadenopathy. Esophagus grossly normal.

Lungs/Pleura: Spiculated nodule in the RIGHT upper lobe just above
the minor fissure measuring 2.0 x 1.5 x 1.7 cm. Tiny pulmonary
nodules on image 37 of series 11 measuring 4 and 2 mm in the medial
and lateral posterior RIGHT lower lobe respectively. Airways are
patent. Ill-defined nodule in the LEFT lung base measuring 3 mm on
image 40.

Musculoskeletal: See below for full musculoskeletal details. Spinal
degenerative changes.

CT ABDOMEN PELVIS FINDINGS

Hepatobiliary: Subtle area of low attenuation along the anterior
LEFT hepatic lobe (image 70, series 7) 10 mm. Post cholecystectomy
with mild extrahepatic biliary duct distension. Portal vein is
patent. Hepatic veins are patent.

Pancreas: Atrophy of the pancreas without focal lesion or sign of
inflammation.

Spleen: Normal

Adrenals/Urinary Tract: Adrenal glands are normal.

RIGHT kidney: Arising from the anterior hilar lip and abutting renal
vasculature is a reasonably well marginated bulge shaped
heterogeneously enhancing RIGHT renal mass that measures 4.8 x 4.3 x
5.7 cm. The RIGHT renal vein is patent. The mass is centered on the
anterior hilar lip displacing hilar vessels. No hydronephrosis. No
LEFT-sided renal mass.

Hyperenhancing RIGHT hilar lymph nodes 2.1 cm lymph node on image 91
of series 7 behind the inferior vena cava. The RIGHT retrocaval
lymph node also with similar enhancement characteristics on image 95
of series 7 measuring 11 mm.

Stomach/Bowel: Post gastric bypass with changes of
gastrojejunostomy. Some gastric thickening is noted diffusely with
some wall enhancement.

Nodule in the duodenal bulb with hyperenhancement measuring 2 x
cm (image 91 of series 7. Other nodular areas of change along the
mucosal surface in the distal stomach/pylorus are noted on image 26
and 27 through image 33 of series 8.

No acute gastrointestinal process. Nodularity in the area of the
appendix but without visualization of the appendix along fascial
planes in the RIGHT lower quadrant measuring 3.2 x 1.7 cm in
greatest axial dimension on image 86 of series 12.

Similar appearing nodule with calcification in the LEFT hemiabdomen
measuring 1.7 cm on image 69 of series 12. Postoperative changes in
the midline of the abdomen.

Vascular/Lymphatic: Patent abdominal vasculature with signs of
minimal atherosclerotic change. No aneurysmal dilation.
Retroperitoneal adenopathy as described above.

Reproductive: No pelvic lymphadenopathy. Posterior to the uterus is
a small area of nodularity with some calcification similar to other
peritoneal lesions described above measuring 2.5 x 1.4 cm with
another peripherally calcified area in the cul-de-sac at the upper
margin of the mesorectum measuring 1.5 cm (image 114, series 12)

Other: An additional calcified peritoneal nodule on image 84 series
12 measures 11 mm. An additional nodule on image 51 of series 12
measuring 14 mm both of these in the LEFT hemiabdomen. Additional
small calcified nodule seen on image 68 of series 12 in the anterior
peritoneum just to the LEFT of midline.

Musculoskeletal: Spinal degenerative changes. No acute or
destructive bone process
IMPRESSION: 1. Spiculated nodule in the RIGHT upper lobe just above the minor
fissure, measuring 2.0 x 1.5 x 1.7 cm, suspicious for lung primary
versus metastatic disease in the chest. PET scan may be helpful.
2. Other small pulmonary nodules are less than 5 mm, attention on
follow-up.
3. Large RIGHT renal mass centered about the anterior hilar lip,
associated with retroperitoneal adenopathy.
4. Nodules in the duodenum and distal stomach are nonspecific,
potentially primary neoplasm of the gastrointestinal tract though
metastatic disease while unusual is also considered.
5. Calcified areas of nodularity throughout the peritoneum
suspicious for peritoneal carcinomatosis. Appendix not discretely
seen and dominant area in the expected location of the appendix.
Correlate with prior appendectomy and with pathology if available.
Implants are present in the cul-de-sac as well. Correlation with
more remote imaging could be helpful to determine whether this could
somehow be related to postoperative/inflammatory changes though
findings remain suspicious for peritoneal disease.
6. Subtle area of low attenuation along the anterior LEFT hepatic
lobe, nonspecific, attention on follow-up. MRI may be helpful for
further evaluation.
7. Signs of coronary artery disease and marked pulmonary arterial
hypertension.
8. Aortic atherosclerosis.

Aortic Atherosclerosis ([id number]).

## 2021-10-20 ENCOUNTER — Other Ambulatory Visit: Payer: Medicare PPO

## 2021-10-22 ENCOUNTER — Telehealth: Payer: Self-pay | Admitting: Oncology

## 2021-10-22 NOTE — Telephone Encounter (Signed)
Attempted to call pt, no answer. Will address issue tomorrow at appt.

## 2021-10-22 NOTE — Telephone Encounter (Signed)
Patient called and stated that she has been drinking her oral contrast "all night"(her CT scan is not until 2/21). She does have an appointment here tomorrow for labs/MD/Keytruda.   I did make patient aware that it is too early to take the prep and that we could further explain at her appointment tomorrow. She has requested a return phone call from a nurse.

## 2021-10-23 ENCOUNTER — Encounter: Payer: Self-pay | Admitting: Oncology

## 2021-10-23 ENCOUNTER — Other Ambulatory Visit: Payer: Self-pay

## 2021-10-23 ENCOUNTER — Inpatient Hospital Stay: Payer: Medicare PPO | Admitting: Oncology

## 2021-10-23 ENCOUNTER — Inpatient Hospital Stay: Payer: Medicare PPO

## 2021-10-23 DIAGNOSIS — C649 Malignant neoplasm of unspecified kidney, except renal pelvis: Secondary | ICD-10-CM

## 2021-10-23 DIAGNOSIS — Z5112 Encounter for antineoplastic immunotherapy: Secondary | ICD-10-CM | POA: Diagnosis not present

## 2021-10-23 LAB — CBC WITH DIFFERENTIAL/PLATELET
Abs Immature Granulocytes: 0.04 10*3/uL (ref 0.00–0.07)
Basophils Absolute: 0 10*3/uL (ref 0.0–0.1)
Basophils Relative: 1 %
Eosinophils Absolute: 0.1 10*3/uL (ref 0.0–0.5)
Eosinophils Relative: 2 %
HCT: 45.3 % (ref 36.0–46.0)
Hemoglobin: 14.4 g/dL (ref 12.0–15.0)
Immature Granulocytes: 1 %
Lymphocytes Relative: 28 %
Lymphs Abs: 1.7 10*3/uL (ref 0.7–4.0)
MCH: 27.5 pg (ref 26.0–34.0)
MCHC: 31.8 g/dL (ref 30.0–36.0)
MCV: 86.5 fL (ref 80.0–100.0)
Monocytes Absolute: 0.7 10*3/uL (ref 0.1–1.0)
Monocytes Relative: 12 %
Neutro Abs: 3.6 10*3/uL (ref 1.7–7.7)
Neutrophils Relative %: 56 %
Platelets: 281 10*3/uL (ref 150–400)
RBC: 5.24 MIL/uL — ABNORMAL HIGH (ref 3.87–5.11)
RDW: 13.6 % (ref 11.5–15.5)
WBC: 6.2 10*3/uL (ref 4.0–10.5)
nRBC: 0 % (ref 0.0–0.2)

## 2021-10-23 LAB — COMPREHENSIVE METABOLIC PANEL
ALT: 10 U/L (ref 0–44)
AST: 14 U/L — ABNORMAL LOW (ref 15–41)
Albumin: 4 g/dL (ref 3.5–5.0)
Alkaline Phosphatase: 134 U/L — ABNORMAL HIGH (ref 38–126)
Anion gap: 12 (ref 5–15)
BUN: 17 mg/dL (ref 8–23)
CO2: 26 mmol/L (ref 22–32)
Calcium: 10.7 mg/dL — ABNORMAL HIGH (ref 8.9–10.3)
Chloride: 93 mmol/L — ABNORMAL LOW (ref 98–111)
Creatinine, Ser: 0.59 mg/dL (ref 0.44–1.00)
GFR, Estimated: >60 mL/min (ref >60)
Glucose, Bld: 355 mg/dL — ABNORMAL HIGH (ref 70–99)
Potassium: 4.6 mmol/L (ref 3.5–5.1)
Sodium: 131 mmol/L — ABNORMAL LOW (ref 135–145)
Total Bilirubin: 0.5 mg/dL (ref 0.3–1.2)
Total Protein: 7.5 g/dL (ref 6.5–8.1)

## 2021-10-26 ENCOUNTER — Other Ambulatory Visit: Payer: Self-pay

## 2021-10-27 ENCOUNTER — Other Ambulatory Visit: Payer: Self-pay

## 2021-10-27 ENCOUNTER — Ambulatory Visit
Admission: RE | Admit: 2021-10-27 | Discharge: 2021-10-27 | Disposition: A | Discharge status: Home / Self Care | Payer: Medicare PPO | Source: Ambulatory Visit | Attending: Oncology | Admitting: Oncology

## 2021-10-27 DIAGNOSIS — C641 Malignant neoplasm of right kidney, except renal pelvis: Secondary | ICD-10-CM | POA: Diagnosis not present

## 2021-10-27 MED ORDER — IOHEXOL 300 MG/ML  SOLN
80.0000 mL | Freq: Once | INTRAMUSCULAR | Status: AC | PRN
  Administered 2021-10-27: 80 mL via INTRAVENOUS

## 2021-10-30 ENCOUNTER — Other Ambulatory Visit: Payer: Self-pay

## 2021-10-30 ENCOUNTER — Encounter: Payer: Self-pay | Admitting: Oncology

## 2021-10-30 ENCOUNTER — Inpatient Hospital Stay: Payer: Medicare PPO

## 2021-10-30 ENCOUNTER — Inpatient Hospital Stay: Payer: Medicare PPO | Admitting: Oncology

## 2021-10-30 VITALS — BP 121/91 | HR 79 | Temp 96.3°F | Resp 18 | Wt 147.9 lb

## 2021-10-30 DIAGNOSIS — C641 Malignant neoplasm of right kidney, except renal pelvis: Secondary | ICD-10-CM | POA: Diagnosis not present

## 2021-10-30 DIAGNOSIS — R739 Hyperglycemia, unspecified: Secondary | ICD-10-CM | POA: Diagnosis not present

## 2021-10-30 DIAGNOSIS — E1165 Type 2 diabetes mellitus with hyperglycemia: Secondary | ICD-10-CM

## 2021-10-30 DIAGNOSIS — F32A Depression, unspecified: Secondary | ICD-10-CM | POA: Diagnosis not present

## 2021-10-30 DIAGNOSIS — C649 Malignant neoplasm of unspecified kidney, except renal pelvis: Secondary | ICD-10-CM

## 2021-10-30 DIAGNOSIS — Z5112 Encounter for antineoplastic immunotherapy: Secondary | ICD-10-CM | POA: Diagnosis not present

## 2021-10-30 DIAGNOSIS — Z794 Long term (current) use of insulin: Secondary | ICD-10-CM

## 2021-10-30 LAB — COMPREHENSIVE METABOLIC PANEL
ALT: 9 U/L (ref 0–44)
AST: 17 U/L (ref 15–41)
Albumin: 3.6 g/dL (ref 3.5–5.0)
Alkaline Phosphatase: 110 U/L (ref 38–126)
Anion gap: 6 (ref 5–15)
BUN: 14 mg/dL (ref 8–23)
CO2: 29 mmol/L (ref 22–32)
Calcium: 10.4 mg/dL — ABNORMAL HIGH (ref 8.9–10.3)
Chloride: 102 mmol/L (ref 98–111)
Creatinine, Ser: 0.59 mg/dL (ref 0.44–1.00)
GFR, Estimated: >60 mL/min (ref >60)
Glucose, Bld: 217 mg/dL — ABNORMAL HIGH (ref 70–99)
Potassium: 4 mmol/L (ref 3.5–5.1)
Sodium: 137 mmol/L (ref 135–145)
Total Bilirubin: 0.1 mg/dL — ABNORMAL LOW (ref 0.3–1.2)
Total Protein: 6.9 g/dL (ref 6.5–8.1)

## 2021-10-30 LAB — CBC WITH DIFFERENTIAL/PLATELET
Abs Immature Granulocytes: 0.04 10*3/uL (ref 0.00–0.07)
Basophils Absolute: 0 10*3/uL (ref 0.0–0.1)
Basophils Relative: 0 %
Eosinophils Absolute: 0.1 10*3/uL (ref 0.0–0.5)
Eosinophils Relative: 2 %
HCT: 43.4 % (ref 36.0–46.0)
Hemoglobin: 13.5 g/dL (ref 12.0–15.0)
Immature Granulocytes: 1 %
Lymphocytes Relative: 24 %
Lymphs Abs: 1.7 10*3/uL (ref 0.7–4.0)
MCH: 27.2 pg (ref 26.0–34.0)
MCHC: 31.1 g/dL (ref 30.0–36.0)
MCV: 87.3 fL (ref 80.0–100.0)
Monocytes Absolute: 0.5 10*3/uL (ref 0.1–1.0)
Monocytes Relative: 7 %
Neutro Abs: 4.7 10*3/uL (ref 1.7–7.7)
Neutrophils Relative %: 66 %
Platelets: 295 10*3/uL (ref 150–400)
RBC: 4.97 MIL/uL (ref 3.87–5.11)
RDW: 13.5 % (ref 11.5–15.5)
WBC: 7 10*3/uL (ref 4.0–10.5)
nRBC: 0 % (ref 0.0–0.2)

## 2021-10-30 MED ORDER — SODIUM CHLORIDE 0.9 % IV SOLN
Freq: Once | INTRAVENOUS | Status: AC
  Filled 2021-10-30: qty 250

## 2021-10-30 MED ORDER — SODIUM CHLORIDE 0.9 % IV SOLN
200.0000 mg | Freq: Once | INTRAVENOUS | Status: AC
  Administered 2021-10-30: 200 mg via INTRAVENOUS
  Filled 2021-10-30: qty 200

## 2021-10-30 NOTE — Progress Notes (Signed)
Hematology/Oncology Progress note [phone] [phone]      Patient Care Team: Pcp, No as PCP - General Barbara Frank as Consulting Physician (Hematology and Oncology)  REFERRING PROVIDER: No ref. provider found  CHIEF COMPLAINTS/REASON FOR VISIT:  Metastatic RCC  HISTORY OF PRESENTING ILLNESS:   Patient is a poor historian. Her mood appears to be depressed.  She currently lives at assisted living facility. Moved from [place]. Widowed 3 years ago.  Her power of attorney is Barbara Frank who is her husband's niece. Barbara Frank was called during this encounter.   Extensive medical record review was performed. She has had multiple images done during the past few months, 05/27/20 CT head wo contrast- no acute intracranial abnormality.  06/01/20 CT abdomen and pelvis with contrast 4 mm proximal left ureteral calculus causing mild proximal left- sided hydroureteronephrosis with periureteric and perinephric stranding on the left.5.2cm enhancing right mid anterior kidney mass. Mildly enlarged right retroperitoneal retrocaval lymph nodes suspicious for nodal metastases Mild cardiomegaly, Small hiatal hernia. Status post gastric bypass surgery and cholecystectomy. Postoperative changes anterior abdominal wall. 06/05/20 CT head wo contrast - No acute intracranial abnormality. Probable tiny right frontal scalp Contusion. 2. Mild global volume loss and minimal chronic ischemic small vessel disease. 06/06/20 CT abdomen pelvis wo contrast 1. Indeterminate lesion in the right kidney which is stable may be a solid mass., 2. Partially calcified uterine fibroids. 3. Left UVJ calculus measuring 5 mm 06/07/20 CT head wo contrast.  1. No acute intracranial abnormality. No significant change. 2. Intracranial atherosclerosis, atrophy and chronic small vessel ischemic changes. 3. Chronic/subacute right maxillary sinus disease.  06/07/20 Cystoscopy by Barbara Frank for Ureteroscopic stone  extraction Admitted on 10/3/21due to failure to thrive,  anorexia, dehydration, electrolyte imbalances.   06/09/20 CT abdomen and chest with contrast 1. Right renal mass is stable in size. Most suggestive of a renal neoplasm. 2. Right retroperitoneal lymph nodes are stable in size; may well be metastatic. 3. Left hydronephrosis and hydroureter is stable. Pelvis is not imaged therefore if there is a distal calculus still present, it is not visible. 4. Sclerotic lesions in the anterior left seventh, eighth and ninth ribs which given distribution are most likely healed rib fractures.  She is on Eliquis 89m Bid for atrial fibrillation. She reports not taking anti depressants that were given by her PCP.  #11/04/2020, PET scan was obtained. PET scan showed interval increasing size of right upper lobe pulmonary lesion, now 3 cm with an SUV of 3.8.  Associated with a new right lower lobe nodule 8 mm.  5.1 cm right renal mass hypermetabolic activity.  1.9 retrocaval lymph node with SUV of 4.8.  Second index retrocaval node with SUV of 2.  Nodular disease seen in the duodenum on previous study is not well demonstrated on noncontrast CT image today.  Some nodular FDG activity, nonspecific. Multiple anterior abdominal soft tissue nodules are evident.  13 mm anterior left nodule shows no substantial hypermetabolic with an SUV of 1.7.  Calcified left lateral nodule with an SUV of 2.9.  2.2 cm calcified nodule lobe right paracolic gutter with an SUV of 2.5.  12/17/2020, right kidney mass biopsy pathology was reviewed and discussed with patient.  Pathology is consistent with RCC-demonstrates the morphology spectrum, papillary, solid and nested areas.  CD10 positive, racemase positive, CK7 negative.  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  TFE 3 break apart FISH testing is pending.  #01/05/2021, addendum pathology TFE3 break-apart  FISH testing was performed and a translocation  involving  Xp11 / TFE3 transcription gene was not detected. HMB-45 IHC is negative  CAIX IHC demonstrates membranous staining.  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  MRI brain was obtained during the admission and was negative for acute process.  Patient mental status improved.  UTI was treated with IV antibiotics and switched to p.o. antibiotic upon discharge.  Patient also was found to have rapid ventricular response with her atrial fibrillation.  Rate was treated with Cardizem drip during hospitalization because and switched to Toprol and Cardizem for rate control.  Eliquis for anticoagulation. Patient's axitinib was held during hospitalization.  There was plan for patient to restart axitinib upon discharge.  However since patient was sent to rehab, he is not able to be started on axitinib due to lack of access to her medication supply.  [facility]  Patient presents for post hospitalization follow-up.  She does not have much recollection of her recent admission.  Reports feeling well today.  He denies any fever, chills, nausea vomiting diarrhea.  03/07/2021, CT abdomen pelvis  Right renal mass slightly enlarged since 10/03/2020.  5.4 x 4.9 x 6.4 comparing to previously 5.0 x 4.4 x 6.3.  Stable adjacent right retroperitoneal lymphadenopathy.  There is a new 1.5 cm nodule in the right lower lobe.  Mild stranding around the right renal mass.  04/01/2022 resumed Axitinib 20mg23mD 05/06/2022 she presented for follow up, she stopped " all her medications". Tachycardia, hyperglycemia, she declined ER evaluation.  06/03/2022, presented for follow up.   Multiple no show to her CT scan. Palliative care service has not been able to reach her  06/23/2021, CT chest abdomen pelvis showed stable large right anterior renal mass.  Stable to slightly larger right-sided retroperitoneal adenopathy.  Waxing and waning pulmonary nodules.  2 lower lobe nodules resolved.  New right middle lobe and right upper lobe nodules.  06/29/2022  Hold chemotherapy due to hyperglycemia.  Blood sugar was 431, due to noncompliance.  Recommend patient to go to emergency room for evaluation and she declines. talked to patient's PCP TracOlivia Mackier the phone and discussed patient's case with her. Patient is not complaint with her medication. Her diabetes was poorly controlled and glucose was extremely high, she refused to go to ER for management. She is also extremely depressed. Most recent CT scan shows stable disease. We both agree that patient is not able to take care of herself.  TracOlivia Mackiel try to get her to see psychiatrist for evaluation of her depression,she may benefit from living in a facility as well. From Oncology aspect, her prognosis is poor, and I will hold off additional treatment until her other acute medical conditions are stabilized. Hospice/palliative care is reasonable if her condition continues to deteriorate. TracOlivia Mackiel update me if she improves. .   07/23/2021 - 07/31/2021, hospitalized due to A. fib with RVR. 09/09/2021 - 09/14/2021, the patient due to A. fib with RVR, uncontrolled diabetes with glucose of 500s, DKA. 09/09/2021, CT angio chest PE protocol showed interval decrease of the right upper lobe nodularity.  No PE.  10/09/2021 , re-establish care, she expressed interests of  resuming RCC treatments. She agrees with getting CT restaging. She reports being complaint with her medication.   INTERVAL HISTORY Barbara Frank is a 76 y.o. female who has above history reviewed by me today presents for follow up visit for management of right renal clear-cell  carcinoma Patient reports that she feels fatigue today. She takes " all medication including her insulin".    Review of Systems  Constitutional:  Positive for fatigue. Negative for appetite change, chills and fever.  HENT:   Negative for hearing loss and voice change.   Eyes:  Negative for eye problems.  Respiratory:  Negative for chest tightness and cough.   Cardiovascular:  Negative for chest pain.  Gastrointestinal:  Negative for abdominal distention, abdominal pain, blood in stool and constipation.  Endocrine: Negative for hot flashes.  Genitourinary:  Negative for difficulty urinating and frequency.   Musculoskeletal:  Negative for arthralgias.  Skin:  Negative for itching and rash.  Neurological:  Negative for extremity weakness.  Hematological:  Negative for adenopathy.  Psychiatric/Behavioral:  Positive for depression. Negative for confusion.    MEDICAL HISTORY:  Past Medical History:  Diagnosis Date   A-fib ([place])    Depression    Diabetes mellitus without complication ([place])    History of kidney stones    Kidney mass     SURGICAL HISTORY: Past Surgical History:  Procedure Laterality Date   GASTRIC BYPASS     HERNIA REPAIR     TONSILLECTOMY     URETEROSCOPY WITH HOLMIUM LASER LITHOTRIPSY Left     SOCIAL HISTORY: Social History   Socioeconomic History   Marital status: Widowed    Spouse name: Not on file   Number of children: Not on file   Years of education: Not on file   Highest education level: Not on file  Occupational History   Not on file  Tobacco Use   Smoking status: Never   Smokeless tobacco: Never  Vaping Use   Vaping Use: Never used  Substance and Sexual Activity   Alcohol use: Not Currently   Drug use: Not Currently   Sexual activity: Not Currently  Other Topics Concern   Not on file  Social History Narrative   Not on file   Social Determinants of Health   Financial Resource Strain: Not on file  Food Insecurity: Not on file   Transportation Needs: Not on file  Physical Activity: Not on file  Stress: Not on file  Social Connections: Not on file  Intimate Partner Violence: Not on file    FAMILY HISTORY: Family History  Problem Relation Age of Onset   Breast cancer Mother    Bladder Cancer Neg Hx    Kidney cancer Neg Hx    Prostate cancer Neg Hx     ALLERGIES:  is allergic to kiwi extract.  MEDICATIONS:  Current Outpatient Medications  Medication Sig Dispense Refill   apixaban (ELIQUIS) 5 MG TABS tablet Take 1 tablet (5 mg total) by mouth 2 (two) times daily. 60 tablet 2   carvedilol (COREG) 6.25 MG tablet Take 1 tablet (6.25 mg total) by mouth 2 (two) times daily with a meal. 60 tablet 2   cetirizine (ZYRTEC) 10 MG tablet Take 0.5 tablets (5 mg total) by mouth daily. 15 tablet 0   diltiazem (CARDIZEM CD) 240 MG 24 hr capsule Take 1 capsule (240 mg total) by mouth every evening. 30 capsule 2   escitalopram (LEXAPRO) 5 MG tablet Take 1 tablet (5 mg total) by mouth daily.  30 tablet 0   feeding supplement, GLUCERNA SHAKE, (GLUCERNA SHAKE) LIQD Take 237 mLs by mouth 3 (three) times daily between meals. 21330 mL 2   insulin glargine (LANTUS) 100 UNIT/ML injection Inject 0.27 mLs (27 Units total) into the skin 2 (two) times daily. 10 mL 11   metFORMIN (GLUCOPHAGE-XR) 500 MG 24 hr tablet Take 1 tablet (500 mg total) by mouth 2 (two) times daily. 60 tablet 2   metoprolol succinate (TOPROL-XL) 25 MG 24 hr tablet Take 25 mg by mouth daily.     insulin aspart (NOVOLOG) 100 UNIT/ML injection Inject 0-15 Units into the skin 3 (three) times daily with meals. CBG 70 - 120: 0 units,  121 - 150: 2 units, 151 - 200: 3 units,  201 - 250: 5 units,  251 - 300: 8 units, 301 - 350: 11 units 351 - 400: 15 units, CBG > 400: call Frank and obtain STAT lab verification (Patient not taking: Reported on 10/30/2021) 10 mL 11   insulin aspart (NOVOLOG) 100 UNIT/ML injection Inject 4 Units into the skin 3 (three) times daily with meals.  (Patient not taking: Reported on 10/30/2021) 10 mL 0   Multiple Vitamin (MULTIVITAMIN WITH MINERALS) TABS tablet Take 1 tablet by mouth daily. (Patient not taking: Reported on 10/30/2021) 30 tablet 2   No current facility-administered medications for this visit.     PHYSICAL EXAMINATION: ECOG PERFORMANCE STATUS: 2 - Symptomatic, <50% confined to bed Vitals:   10/30/21 0918  BP: (!) 121/91  Pulse: 79  Resp: 18  Temp: (!) 96.3 F (35.7 C)   Filed Weights   10/30/21 0918  Weight: 147 lb 14.4 oz (67.1 kg)    Physical Exam Constitutional:      General: She is not in acute distress.    Comments: Frail elderly female, she ambulates with a walker.   HENT:     Head: Normocephalic and atraumatic.  Eyes:     General: No scleral icterus. Cardiovascular:     Rate and Rhythm: Normal rate. Rhythm irregular.     Heart sounds: Normal heart sounds.  Pulmonary:     Effort: Pulmonary effort is normal. No respiratory distress.     Breath sounds: No wheezing.  Abdominal:     General: Bowel sounds are normal. There is no distension.     Palpations: Abdomen is soft.  Musculoskeletal:        General: No deformity. Normal range of motion.     Cervical back: Normal range of motion and neck supple.  Skin:    General: Skin is warm and dry.     Findings: No erythema or rash.  Neurological:     Mental Status: She is alert and oriented to person, place, and time.     Cranial Nerves: No cranial nerve deficit.     Coordination: Coordination normal.  Psychiatric:     Comments: Depressed    LABORATORY DATA:  I have reviewed the data as listed Lab Results  Component Value Date   WBC 7.0 10/30/2021   HGB 13.5 10/30/2021   HCT 43.4 10/30/2021   MCV 87.3 10/30/2021   PLT 295 10/30/2021   Recent Labs    10/09/21 0924 10/23/21 0813 10/30/21 0903  NA 135 131* 137  K 4.2 4.6 4.0  CL 100 93* 102  CO2 '30 26 29  ' GLUCOSE 279* 355* 217*  BUN '11 17 14  ' CREATININE 0.55 0.59 0.59  CALCIUM 10.4*  10.7* 10.4*  GFRNONAA >60 >60 >60  PROT 7.4 7.5 6.9  ALBUMIN 4.0 4.0 3.6  AST 18 14* 17  ALT '15 10 9  ' ALKPHOS 113 134* 110  BILITOT 0.5 0.5 0.1*    Iron/TIBC/Ferritin/ %Sat No results found for: IRON, TIBC, FERRITIN, IRONPCTSAT    RADIOGRAPHIC STUDIES: I have personally reviewed the radiological images as listed and agreed with the findings in the report. CT CHEST ABDOMEN PELVIS W CONTRAST  Result Date: 10/28/2021 CLINICAL DATA:  76 year old female with history of renal cell carcinoma. Follow-up study. EXAM: CT CHEST, ABDOMEN, AND PELVIS WITH CONTRAST TECHNIQUE: Multidetector CT imaging of the chest, abdomen and pelvis was performed following the standard protocol during bolus administration of intravenous contrast. RADIATION DOSE REDUCTION: This exam was performed according to the departmental dose-optimization program which includes automated exposure control, adjustment of the mA and/or kV according to patient size and/or use of iterative reconstruction technique. CONTRAST:  69m OMNIPAQUE IOHEXOL 300 MG/ML  SOLN COMPARISON:  Chest CTA 09/09/2021. CT the chest, abdomen and pelvis 06/23/2021. FINDINGS: CT CHEST FINDINGS Cardiovascular: Heart size is enlarged with biatrial dilatation. There is no significant pericardial fluid, thickening or pericardial calcification. There is aortic atherosclerosis, as well as atherosclerosis of the great vessels of the mediastinum and the coronary arteries, including calcified atherosclerotic plaque in the left main, left anterior descending and left circumflex coronary arteries. Thickening calcification of the aortic valve. Dilatation of the pulmonic trunk (4.2 cm in diameter), concerning for pulmonary arterial hypertension. Mediastinum/Nodes: No pathologically enlarged mediastinal or hilar lymph nodes. Esophagus is unremarkable in appearance. No axillary lymphadenopathy. Lungs/Pleura: Small calcified granuloma in the right lower lobe. A few other scattered tiny  1-3 mm pulmonary nodules are noted, nonspecific, but similar to the prior study and favored to be benign. Previously noted 5 mm right upper lobe pulmonary nodule has resolved. No acute consolidative airspace disease. No pleural effusions. Musculoskeletal: There are no aggressive appearing lytic or blastic lesions noted in the visualized portions of the skeleton. CT ABDOMEN PELVIS FINDINGS Hepatobiliary: No suspicious cystic or solid hepatic lesions. No intra or extrahepatic biliary ductal dilatation. Status post cholecystectomy. Pancreas: No pancreatic mass. No pancreatic ductal dilatation. No pancreatic or peripancreatic fluid collections or inflammatory changes. Spleen: Unremarkable. Adrenals/Urinary Tract: Large heterogeneously enhancing right renal mass centered in the interpolar region anteriorly measuring 7.2 x 6.1 x 7.2 cm (axial image 62 of series 3 and coronal image 78 of series 5), increased in size compared to the prior examination. This mass appears likely encapsulated within Gerota's fascia although it does obscure the fat plane between the lesion and the adjacent right lobe of the liver (there is no definitive direct invasion into the hepatic parenchyma at this time). The mass comes in close proximity to the right renal vein, although the right renal vein is widely patent without definite tumor thrombus at this time. Left kidney and bilateral adrenal glands are normal in appearance. No hydroureteronephrosis. Urinary bladder is normal in appearance. Stomach/Bowel: Postoperative changes in the upper abdomen from prior partial gastrectomy. No pathologic dilatation of small bowel or colon. Normal appendix. Vascular/Lymphatic: Aortic atherosclerosis, without evidence of aneurysm or dissection in the abdominal or pelvic vasculature. Right renal vein remains patent at this time. Extensive retroperitoneal lymphadenopathy is again noted, with the largest retroperitoneal lymph node in the retrocaval nodal  station adjacent to the right renal hilum (axial image 58 of series 3) currently measuring 2.4 x 2.6 cm (previously 2.0 x 2.3 cm). Other retroperitoneal lymph nodes also appear enlarged compared to the prior study, most  notably and aortocaval lymph node (axial image 70 of series 3) measuring 1.6 cm in short axis (previously 8 mm). No definite pelvic lymphadenopathy. Reproductive: Uterus is atrophic with multiple small lesions, likely to represent tiny partially calcified fibroids. Ovaries are atrophic. Other: No significant volume of ascites.  No pneumoperitoneum. Musculoskeletal: There are no aggressive appearing lytic or blastic lesions noted in the visualized portions of the skeleton. IMPRESSION: 1. Progression of disease as evidenced by interval enlargement of the primary right renal mass, and worsening retroperitoneal lymphadenopathy, as detailed above. 2. Small pulmonary nodules are stable or regressed compared to the prior study, favored to be benign. No definitive findings of metastatic disease in the thorax. 3. Cardiomegaly with biatrial dilatation 4. There is aortic atherosclerosis, as well as atherosclerosis of the great vessels of the mediastinum and the coronary arteries, including calcified atherosclerotic plaque in the left main and 2 vessel coronary arteries. 5. There are calcifications of the aortic valve. Echocardiographic correlation for evaluation of potential valvular dysfunction may be warranted if clinically indicated. 6. Dilatation of the pulmonic trunk (4.2 cm in diameter), concerning for pulmonary arterial hypertension. 7. Additional incidental findings, as above. Electronically Signed   By: Vinnie Langton M.D.   On: 10/28/2021 16:08      ASSESSMENT & PLAN:  1. Renal cell carcinoma of right kidney ([place])   2. Encounter for antineoplastic immunotherapy   3. Hyperglycemia   4. Depression, unspecified depression type   5. Hypercalcemia    #Right kidney mass with retroperitoneal  lymphadenopathy, lung mass and peritoneal nodularity 10/28/21 CT showed progression of right renal mass, retroperitoneal lymphadenopathy. Stable small lung nodules.  Cardiomegaly with biatrial dilatation. Aortic atherosclerosis. calcifications of the aortic valve, Dilatation of the pulmonic trunk  Labs are reviewed and discussed with patient. Proceed with Keytruda.  Discussed about resuming Axitinib in the future.   #Diabetes, uncontrolled.  Blood glucose level in the 217s., due to noncompliance.  Follow up with pcp  #Severe depression She has not establish care with psychiatry.  # Hypercalcemia, likely due to malignancy. Recommend her to avoid calcium supplementation.   All questions were answered. The patient knows to call the clinic with any problems questions or concerns.  Follow up 3 weeks lab Frank Keytruda.  Barbara Server, MD, PhD Hematology Oncology  10/30/2021

## 2021-10-30 NOTE — Patient Instructions (Signed)
[facility]  Discharge Instructions: °Thank you for choosing [facility] to provide your oncology and hematology care.  °If you have a lab appointment with the Cancer Center, please go directly to the Cancer Center and check in at the registration area. ° °Wear comfortable clothing and clothing appropriate for easy access to any Portacath or PICC line.  ° °We strive to give you quality time with your provider. You may need to reschedule your appointment if you arrive late (15 or more minutes).  Arriving late affects you and other patients whose appointments are after yours.  Also, if you miss three or more appointments without notifying the office, you may be dismissed from the clinic at the provider’s discretion.    °  °For prescription refill requests, have your pharmacy contact our office and allow 72 hours for refills to be completed.   ° °Today you received the following chemotherapy and/or immunotherapy agents Keytruda °    °  °To help prevent nausea and vomiting after your treatment, we encourage you to take your nausea medication as directed. ° °BELOW ARE SYMPTOMS THAT SHOULD BE REPORTED IMMEDIATELY: °*FEVER GREATER THAN 100.4 F (38 °C) OR HIGHER °*CHILLS OR SWEATING °*NAUSEA AND VOMITING THAT IS NOT CONTROLLED WITH YOUR NAUSEA MEDICATION °*UNUSUAL SHORTNESS OF BREATH °*UNUSUAL BRUISING OR BLEEDING °*URINARY PROBLEMS (pain or burning when urinating, or frequent urination) °*BOWEL PROBLEMS (unusual diarrhea, constipation, pain near the anus) °TENDERNESS IN MOUTH AND THROAT WITH OR WITHOUT PRESENCE OF ULCERS (sore throat, sores in mouth, or a toothache) °UNUSUAL RASH, SWELLING OR PAIN  °UNUSUAL VAGINAL DISCHARGE OR ITCHING  ° °Items with * indicate a potential emergency and should be followed up as soon as possible or go to the Emergency Department if any problems should occur. ° °Please show the CHEMOTHERAPY ALERT CARD or IMMUNOTHERAPY ALERT CARD at check-in to  the Emergency Department and triage nurse. ° °Should you have questions after your visit or need to cancel or reschedule your appointment, please contact [facility]  [phone] and follow the prompts.  Office hours are 8:00 a.m. to 4:30 p.m. Monday - Friday. Please note that voicemails left after 4:00 p.m. may not be returned until the following business day.  We are closed weekends and major holidays. You have access to a nurse at all times for urgent questions. Please call the main number to the clinic [phone] and follow the prompts. ° °For any non-urgent questions, you may also contact your provider using MyChart. We now offer e-Visits for anyone 18 and older to request care online for non-urgent symptoms. For details visit [email or web address]. °  °Also download the MyChart app! Go to the app store, search "MyChart", open the app, select [facility], and log in with your MyChart username and password. ° °Due to Covid, a mask is required upon entering the hospital/clinic. If you do not have a mask, one will be given to you upon arrival. For doctor visits, patients may have 1 support person aged 18 or older with them. For treatment visits, patients cannot have anyone with them due to current Covid guidelines and our immunocompromised population.  °

## 2021-10-30 NOTE — Progress Notes (Signed)
Pt here for follow up. Reports that she has been feeling tired.

## 2021-11-09 ENCOUNTER — Encounter: Payer: Self-pay | Admitting: Oncology

## 2021-11-09 NOTE — Progress Notes (Signed)
Cancelled appt

## 2021-11-23 ENCOUNTER — Other Ambulatory Visit: Payer: Medicare PPO

## 2021-11-23 ENCOUNTER — Ambulatory Visit: Payer: Medicare PPO | Admitting: Nurse Practitioner

## 2021-11-23 ENCOUNTER — Ambulatory Visit: Payer: Medicare PPO

## 2021-11-24 ENCOUNTER — Inpatient Hospital Stay: Payer: Medicare PPO

## 2021-11-24 ENCOUNTER — Inpatient Hospital Stay: Payer: Medicare PPO | Attending: Oncology | Admitting: Nurse Practitioner

## 2021-11-24 ENCOUNTER — Encounter: Payer: Self-pay | Admitting: Nurse Practitioner

## 2021-11-24 ENCOUNTER — Other Ambulatory Visit: Payer: Self-pay

## 2021-11-24 VITALS — BP 140/89 | HR 77 | Temp 97.1°F | Ht 62.0 in | Wt 152.8 lb

## 2021-11-24 DIAGNOSIS — C649 Malignant neoplasm of unspecified kidney, except renal pelvis: Secondary | ICD-10-CM

## 2021-11-24 DIAGNOSIS — C641 Malignant neoplasm of right kidney, except renal pelvis: Secondary | ICD-10-CM | POA: Diagnosis present

## 2021-11-24 DIAGNOSIS — Z91199 Patient's noncompliance with other medical treatment and regimen due to unspecified reason: Secondary | ICD-10-CM

## 2021-11-24 DIAGNOSIS — Z5112 Encounter for antineoplastic immunotherapy: Secondary | ICD-10-CM | POA: Insufficient documentation

## 2021-11-24 DIAGNOSIS — E1169 Type 2 diabetes mellitus with other specified complication: Secondary | ICD-10-CM

## 2021-11-24 DIAGNOSIS — Z79899 Other long term (current) drug therapy: Secondary | ICD-10-CM | POA: Diagnosis not present

## 2021-11-24 DIAGNOSIS — Z794 Long term (current) use of insulin: Secondary | ICD-10-CM

## 2021-11-24 LAB — CBC WITH DIFFERENTIAL/PLATELET
Abs Immature Granulocytes: 0.03 10*3/uL (ref 0.00–0.07)
Basophils Absolute: 0.1 10*3/uL (ref 0.0–0.1)
Basophils Relative: 1 %
Eosinophils Absolute: 0.2 10*3/uL (ref 0.0–0.5)
Eosinophils Relative: 3 %
HCT: 39.2 % (ref 36.0–46.0)
Hemoglobin: 12.5 g/dL (ref 12.0–15.0)
Immature Granulocytes: 1 %
Lymphocytes Relative: 26 %
Lymphs Abs: 1.6 10*3/uL (ref 0.7–4.0)
MCH: 26.9 pg (ref 26.0–34.0)
MCHC: 31.9 g/dL (ref 30.0–36.0)
MCV: 84.3 fL (ref 80.0–100.0)
Monocytes Absolute: 0.5 10*3/uL (ref 0.1–1.0)
Monocytes Relative: 9 %
Neutro Abs: 3.7 10*3/uL (ref 1.7–7.7)
Neutrophils Relative %: 60 %
Platelets: 234 10*3/uL (ref 150–400)
RBC: 4.65 MIL/uL (ref 3.87–5.11)
RDW: 13.4 % (ref 11.5–15.5)
WBC: 6.1 10*3/uL (ref 4.0–10.5)
nRBC: 0 % (ref 0.0–0.2)

## 2021-11-24 LAB — COMPREHENSIVE METABOLIC PANEL
ALT: 9 U/L (ref 0–44)
AST: 16 U/L (ref 15–41)
Albumin: 3.5 g/dL (ref 3.5–5.0)
Alkaline Phosphatase: 107 U/L (ref 38–126)
Anion gap: 7 (ref 5–15)
BUN: 14 mg/dL (ref 8–23)
CO2: 26 mmol/L (ref 22–32)
Calcium: 10.1 mg/dL (ref 8.9–10.3)
Chloride: 98 mmol/L (ref 98–111)
Creatinine, Ser: 0.5 mg/dL (ref 0.44–1.00)
GFR, Estimated: >60 mL/min (ref >60)
Glucose, Bld: 308 mg/dL — ABNORMAL HIGH (ref 70–99)
Potassium: 4 mmol/L (ref 3.5–5.1)
Sodium: 131 mmol/L — ABNORMAL LOW (ref 135–145)
Total Bilirubin: 0.5 mg/dL (ref 0.3–1.2)
Total Protein: 6.7 g/dL (ref 6.5–8.1)

## 2021-11-24 LAB — TSH: TSH: 2.347 u[IU]/mL (ref 0.350–4.500)

## 2021-11-24 MED ORDER — SODIUM CHLORIDE 0.9 % IV SOLN
200.0000 mg | Freq: Once | INTRAVENOUS | Status: AC
  Administered 2021-11-24: 200 mg via INTRAVENOUS
  Filled 2021-11-24: qty 200

## 2021-11-24 MED ORDER — SODIUM CHLORIDE 0.9 % IV SOLN
Freq: Once | INTRAVENOUS | Status: AC
  Filled 2021-11-24: qty 250

## 2021-11-24 NOTE — Progress Notes (Signed)
?Hematology/Oncology Progress note ?[phone] [phone] ? ? ?Patient Care Team: ?Pcp, No as PCP - General ?Barbara Server, MD as Consulting Physician (Hematology and Oncology) ? ?REFERRING PROVIDER: ?Barbara Server, MD  ? ?CHIEF COMPLAINTS/REASON FOR VISIT:  ?Metastatic RCC ? ?HISTORY OF PRESENTING ILLNESS:  ?Patient is a poor historian. Her mood appears to be depressed.  ?She currently lives at assisted living facility. Moved from [place]. Widowed 3 years ago.  ?Her power of attorney is Barbara Frank who is her husband's niece. Barbara Frank was called during this encounter.  ? ?Extensive medical record review was performed. She has had multiple images done during the past few months, ?05/27/20 CT head wo contrast- no acute intracranial abnormality.  ?06/01/20 CT abdomen and pelvis with contrast ?4 mm proximal left ureteral calculus causing mild proximal left- sided hydroureteronephrosis with periureteric and perinephric stranding on the left.5.2cm enhancing right mid anterior kidney mass. Mildly enlarged right retroperitoneal retrocaval lymph nodes suspicious for nodal metastases ?Mild cardiomegaly, Small hiatal hernia. Status post gastric bypass surgery and cholecystectomy. ?Postoperative changes anterior abdominal wall. ?06/05/20 CT head wo contrast - No acute intracranial abnormality. Probable tiny right frontal scalp Contusion. 2. Mild global volume loss and minimal chronic ischemic small vessel disease. ?06/06/20 CT abdomen pelvis wo contrast ?1. Indeterminate lesion in the right kidney which is stable may be a solid mass., 2. Partially calcified uterine fibroids. ?3. Left UVJ calculus measuring 5 mm ?06/07/20 CT head wo contrast.  ?1. No acute intracranial abnormality. No significant change. 2. Intracranial atherosclerosis, atrophy and chronic small vessel ischemic changes. 3. Chronic/subacute right maxillary sinus disease. ? ?06/07/20 Cystoscopy by Barbara Frank for Ureteroscopic stone extraction ?Admitted  on 10/3/21due to failure to thrive,  anorexia, dehydration, electrolyte imbalances.  ? ?06/09/20 CT abdomen and chest with contrast ?1. Right renal mass is stable in size. Most suggestive of a renal neoplasm. 2. Right retroperitoneal lymph nodes are stable in size; may well be metastatic. ?3. Left hydronephrosis and hydroureter is stable. Pelvis is not imaged therefore if there is a distal calculus still present, it is not visible. ?4. Sclerotic lesions in the anterior left seventh, eighth and ninth ribs which given distribution are most likely healed rib fractures. ? ?She is on Eliquis 39m Bid for atrial fibrillation. She reports not taking anti depressants that were given by her PCP. ? ?#11/04/2020, PET scan was obtained. ?PET scan showed interval increasing size of right upper lobe pulmonary lesion, now 3 cm with an SUV of 3.8.  Associated with a new right lower lobe nodule 8 mm.  5.1 cm right renal mass hypermetabolic activity.  1.9 retrocaval lymph node with SUV of 4.8.  Second index retrocaval node with SUV of 2.  Nodular disease seen in the duodenum on previous study is not well demonstrated on noncontrast CT image today.  Some nodular FDG activity, nonspecific. ?Multiple anterior abdominal soft tissue nodules are evident.  13 mm anterior left nodule shows no substantial hypermetabolic with an SUV of 1.7.  Calcified left lateral nodule with an SUV of 2.9.  2.2 cm calcified nodule lobe right paracolic gutter with an SUV of 2.5. ? ?12/17/2020, right kidney mass biopsy pathology was reviewed and discussed with patient.  Pathology is consistent with RCC-demonstrates the morphology spectrum, papillary, solid and nested areas.  CD10 positive, racemase positive, CK7 negative.  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  TFE 3 break apart FISH testing is pending. ? ?#01/05/2021, addendum pathology ?TFE3 break-apart FISH testing was performed  and a translocation  ?involving Xp11 / TFE3  transcription gene was not detected. HMB-45 IHC is negative  ?CAIX IHC demonstrates membranous staining.  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 ?Omniseq SETD2 S1165f, VHL T1233f TMB 5.4, MS stable, PD-L1 <1%, ADORA2A ? ?01/08/22 started on Keytruda ?02/20/2022 added Axitinib 45m76mID ?03/06/2021-03/16/2021, patient was admitted due to acute delirium secondary to UTI/hematuria.  MRI brain was obtained during the admission and was negative for acute process.  Patient mental status improved.  UTI was treated with IV antibiotics and switched to p.o. antibiotic upon discharge.  Patient also was found to have rapid ventricular response with her atrial fibrillation.  Rate was treated with Cardizem drip during hospitalization because and switched to Toprol and Cardizem for rate control.  Eliquis for anticoagulation. ?Patient's axitinib was held during hospitalization. ? ?There was plan for patient to restart axitinib upon discharge.  However since patient was sent to rehab, he is not able to be started on axitinib due to lack of access to her medication supply.  [facility] ? ?Patient presents for post hospitalization follow-up.  She does not have much recollection of her recent admission.  Reports feeling well today.  He denies any fever, chills, nausea vomiting diarrhea. ? ?03/07/2021, CT abdomen pelvis  ?Right renal mass slightly enlarged since 10/03/2020.  5.4 x 4.9 x 6.4 comparing to previously 5.0 x 4.4 x 6.3.  Stable adjacent right retroperitoneal lymphadenopathy.  There is a new 1.5 cm nodule in the right lower lobe.  Mild stranding around the right renal mass. ? ?04/01/2022 resumed Axitinib 45mg21mD ?05/06/2022 she presented for follow up, she stopped " all her medications". Tachycardia, hyperglycemia, she declined ER evaluation.  ?06/03/2022, presented for follow up.  ?Multiple no show  to her CT scan. Palliative care service has not been able to reach her ? ?06/23/2021, CT chest abdomen pelvis showed stable large right anterior renal mass.  Stable to slightly larger right-sided retroperitoneal adenopathy.  Waxing and waning pulmonary nodules.  2 lower lobe nodules resolved.  New right middle lobe and right upper lobe nodules. ? ?06/29/2022  Hold chemotherapy due to hyperglycemia.  Blood sugar was 431, due to noncompliance.  Recommend patient to go to emergency room for evaluation and she declines. ?talked to patient's PCP Barbara Frank the phone and discussed patient's case with her. Patient is not complaint with her medication. Her diabetes was poorly controlled and glucose was extremely high, she refused to go to ER for management. She is also extremely depressed. Most recent CT scan shows stable disease. We both agree that patient is not able to take care of herself.  Barbara Mackiel try to get her to see psychiatrist for evaluation of her depression,she may benefit from living in a facility as well. From Oncology aspect, her prognosis is poor, and I will hold off additional treatment until her other acute medical conditions are stabilized. Hospice/palliative care is reasonable if her condition continues to deteriorate. Barbara Mackiel update me if she improves. .  ? ?07/23/2021 - 07/31/2021, hospitalized due to A. fib with RVR. ?09/09/2021 - 09/14/2021, the patient due to A. fib with RVR, uncontrolled diabetes with glucose of 500s, DKA. ?09/09/2021, CT angio chest PE protocol showed interval decrease of the right upper lobe nodularity.  No PE. ? ?10/09/2021 , re-establish care, she expressed interests of resuming RCC treatments. She  agrees with getting CT restaging. She reports being complaint with her medication.  ? ?INTERVAL HISTORY ?Barbara Frank is a 76 y.o. female who has above history reviewed by me today presents for follow up visit for management of right renal clear-cell carcinoma and consideration of  [place]. She reports being out of her medications including her insulin. Is unsure how to get medications refilled.  ? ?Review of Systems  ?Constitutional:  Positive for fatigue. Negative for appetite change, chil

## 2021-11-24 NOTE — Progress Notes (Signed)
Pt states she needs a refill on her insulin. ? ?Having some sudden loss of hearing in her right ear. ?

## 2021-11-24 NOTE — Patient Instructions (Signed)
[facility]  Discharge Instructions: ?Thank you for choosing [facility] to provide your oncology and hematology care.  ?If you have a lab appointment with the Cancer Center, please go directly to the Cancer Center and check in at the registration area. ? ?Wear comfortable clothing and clothing appropriate for easy access to any Portacath or PICC line.  ? ?We strive to give you quality time with your provider. You may need to reschedule your appointment if you arrive late (15 or more minutes).  Arriving late affects you and other patients whose appointments are after yours.  Also, if you miss three or more appointments without notifying the office, you may be dismissed from the clinic at the provider?s discretion.    ?  ?For prescription refill requests, have your pharmacy contact our office and allow 72 hours for refills to be completed.   ? ?Today you received the following chemotherapy and/or immunotherapy agents KEYTRUDA ?    ?  ?To help prevent nausea and vomiting after your treatment, we encourage you to take your nausea medication as directed. ? ?BELOW ARE SYMPTOMS THAT SHOULD BE REPORTED IMMEDIATELY: ?*FEVER GREATER THAN 100.4 F (38 ?C) OR HIGHER ?*CHILLS OR SWEATING ?*NAUSEA AND VOMITING THAT IS NOT CONTROLLED WITH YOUR NAUSEA MEDICATION ?*UNUSUAL SHORTNESS OF BREATH ?*UNUSUAL BRUISING OR BLEEDING ?*URINARY PROBLEMS (pain or burning when urinating, or frequent urination) ?*BOWEL PROBLEMS (unusual diarrhea, constipation, pain near the anus) ?TENDERNESS IN MOUTH AND THROAT WITH OR WITHOUT PRESENCE OF ULCERS (sore throat, sores in mouth, or a toothache) ?UNUSUAL RASH, SWELLING OR PAIN  ?UNUSUAL VAGINAL DISCHARGE OR ITCHING  ? ?Items with * indicate a potential emergency and should be followed up as soon as possible or go to the Emergency Department if any problems should occur. ? ?Please show the CHEMOTHERAPY ALERT CARD or IMMUNOTHERAPY ALERT CARD at check-in to  the Emergency Department and triage nurse. ? ?Should you have questions after your visit or need to cancel or reschedule your appointment, please contact [facility]  [phone] and follow the prompts.  Office hours are 8:00 a.m. to 4:30 p.m. Monday - Friday. Please note that voicemails left after 4:00 p.m. may not be returned until the following business day.  We are closed weekends and major holidays. You have access to a nurse at all times for urgent questions. Please call the main number to the clinic [phone] and follow the prompts. ? ?For any non-urgent questions, you may also contact your provider using MyChart. We now offer e-Visits for anyone 18 and older to request care online for non-urgent symptoms. For details visit [email or web address]. ?  ?Also download the MyChart app! Go to the app store, search "MyChart", open the app, select [facility], and log in with your MyChart username and password. ? ?Due to Covid, a mask is required upon entering the hospital/clinic. If you do not have a mask, one will be given to you upon arrival. For doctor visits, patients may have 1 support person aged 18 or older with them. For treatment visits, patients cannot have anyone with them due to current Covid guidelines and our immunocompromised population.  ? ?Pembrolizumab injection ?What is this medication? ?PEMBROLIZUMAB (pem broe liz ue mab) is a monoclonal antibody. It is used to treat certain types of cancer. ?This medicine may be used for other purposes; ask your health care provider or pharmacist if you have questions. ?COMMON BRAND NAME(S): Keytruda ?What should I tell my care   team before I take this medication? ?They need to know if you have any of these conditions: ?autoimmune diseases like Crohn's disease, ulcerative colitis, or lupus ?have had or planning to have an allogeneic stem cell transplant (uses someone else's stem cells) ?history of organ transplant ?history  of chest radiation ?nervous system problems like myasthenia gravis or Guillain-Barre syndrome ?an unusual or allergic reaction to pembrolizumab, other medicines, foods, dyes, or preservatives ?pregnant or trying to get pregnant ?breast-feeding ?How should I use this medication? ?This medicine is for infusion into a vein. It is given by a health care professional in a hospital or clinic setting. ?A special MedGuide will be given to you before each treatment. Be sure to read this information carefully each time. ?Talk to your pediatrician regarding the use of this medicine in children. While this drug may be prescribed for children as young as 6 months for selected conditions, precautions do apply. ?Overdosage: If you think you have taken too much of this medicine contact a poison control center or emergency room at once. ?NOTE: This medicine is only for you. Do not share this medicine with others. ?What if I miss a dose? ?It is important not to miss your dose. Call your doctor or health care professional if you are unable to keep an appointment. ?What may interact with this medication? ?Interactions have not been studied. ?This list may not describe all possible interactions. Give your health care provider a list of all the medicines, herbs, non-prescription drugs, or dietary supplements you use. Also tell them if you smoke, drink alcohol, or use illegal drugs. Some items may interact with your medicine. ?What should I watch for while using this medication? ?Your condition will be monitored carefully while you are receiving this medicine. ?You may need blood work done while you are taking this medicine. ?Do not become pregnant while taking this medicine or for 4 months after stopping it. Women should inform their doctor if they wish to become pregnant or think they might be pregnant. There is a potential for serious side effects to an unborn child. Talk to your health care professional or pharmacist for more  information. Do not breast-feed an infant while taking this medicine or for 4 months after the last dose. ?What side effects may I notice from receiving this medication? ?Side effects that you should report to your doctor or health care professional as soon as possible: ?allergic reactions like skin rash, itching or hives, swelling of the face, lips, or tongue ?bloody or black, tarry ?breathing problems ?changes in vision ?chest pain ?chills ?confusion ?constipation ?cough ?diarrhea ?dizziness or feeling faint or lightheaded ?fast or irregular heartbeat ?fever ?flushing ?joint pain ?low blood counts - this medicine may decrease the number of white blood cells, red blood cells and platelets. You may be at increased risk for infections and bleeding. ?muscle pain ?muscle weakness ?pain, tingling, numbness in the hands or feet ?persistent headache ?redness, blistering, peeling or loosening of the skin, including inside the mouth ?signs and symptoms of high blood sugar such as dizziness; dry mouth; dry skin; fruity breath; nausea; stomach pain; increased hunger or thirst; increased urination ?signs and symptoms of kidney injury like trouble passing urine or change in the amount of urine ?signs and symptoms of liver injury like dark urine, light-colored stools, loss of appetite, nausea, right upper belly pain, yellowing of the eyes or skin ?sweating ?swollen lymph nodes ?weight loss ?Side effects that usually do not require medical attention (report to your doctor   or health care professional if they continue or are bothersome): ?decreased appetite ?hair loss ?tiredness ?This list may not describe all possible side effects. Call your doctor for medical advice about side effects. You may report side effects to FDA at 1-800-FDA-1088. ?Where should I keep my medication? ?This drug is given in a hospital or clinic and will not be stored at home. ?NOTE: This sheet is a summary. It may not cover all possible information. If you  have questions about this medicine, talk to your doctor, pharmacist, or health care provider. ?? 2022 Elsevier/Gold Standard (2021-05-12 00:00:00) ? ?

## 2021-12-03 ENCOUNTER — Telehealth: Payer: Self-pay | Admitting: *Deleted

## 2021-12-03 NOTE — Telephone Encounter (Signed)
Patient called and left vm on sch. Line. Requsted a call back to confirm her apt times on 4/7. I contacted patient and reviewed her apt time for 9 am arrival for labs on 4/7- pt will see md and rcv her treatment on this day as well. Pt thanked me for calling her back. ?

## 2021-12-04 ENCOUNTER — Telehealth: Payer: Self-pay | Admitting: Oncology

## 2021-12-04 NOTE — Telephone Encounter (Signed)
Per RN inbasket ,patient was scheduled for duplicate appointments on 4/7 and 4/11 and requested to cancel all appointments on 4/7. Appointments cancelled and left VM with patient notifying her that she would not need to come in on 4/7, rather she should keep her appointment on 4/11. Asked that she call back to confirm and apologized for the confusion.  ?

## 2021-12-10 ENCOUNTER — Telehealth: Payer: Self-pay | Admitting: *Deleted

## 2021-12-10 NOTE — Telephone Encounter (Signed)
Patient called stating that she cannot do the appointment 4/11 and it needs to be rescheduled, she cannot do the 12th either. Please return her call to reschedule her appts ?

## 2021-12-11 ENCOUNTER — Ambulatory Visit: Payer: Medicare PPO | Admitting: Oncology

## 2021-12-11 ENCOUNTER — Ambulatory Visit: Payer: Medicare PPO

## 2021-12-11 ENCOUNTER — Other Ambulatory Visit: Payer: Medicare PPO

## 2021-12-15 ENCOUNTER — Other Ambulatory Visit: Payer: Medicare PPO

## 2021-12-15 ENCOUNTER — Ambulatory Visit: Payer: Medicare PPO | Admitting: Oncology

## 2021-12-15 ENCOUNTER — Ambulatory Visit: Payer: Medicare PPO

## 2021-12-17 ENCOUNTER — Inpatient Hospital Stay: Payer: Medicare PPO

## 2021-12-17 ENCOUNTER — Encounter: Payer: Self-pay | Admitting: Oncology

## 2021-12-17 ENCOUNTER — Inpatient Hospital Stay: Payer: Medicare PPO | Admitting: Oncology

## 2021-12-17 ENCOUNTER — Inpatient Hospital Stay: Payer: Medicare PPO | Attending: Oncology

## 2021-12-17 VITALS — BP 152/85 | HR 111 | Temp 98.7°F | Resp 20 | Wt 149.3 lb

## 2021-12-17 VITALS — BP 137/82 | HR 85 | Resp 18

## 2021-12-17 DIAGNOSIS — C641 Malignant neoplasm of right kidney, except renal pelvis: Secondary | ICD-10-CM | POA: Diagnosis present

## 2021-12-17 DIAGNOSIS — Z5112 Encounter for antineoplastic immunotherapy: Secondary | ICD-10-CM | POA: Insufficient documentation

## 2021-12-17 DIAGNOSIS — F32A Depression, unspecified: Secondary | ICD-10-CM

## 2021-12-17 DIAGNOSIS — E1169 Type 2 diabetes mellitus with other specified complication: Secondary | ICD-10-CM

## 2021-12-17 DIAGNOSIS — Z794 Long term (current) use of insulin: Secondary | ICD-10-CM

## 2021-12-17 DIAGNOSIS — C649 Malignant neoplasm of unspecified kidney, except renal pelvis: Secondary | ICD-10-CM | POA: Diagnosis not present

## 2021-12-17 LAB — COMPREHENSIVE METABOLIC PANEL
ALT: 10 U/L (ref 0–44)
AST: 15 U/L (ref 15–41)
Albumin: 3.6 g/dL (ref 3.5–5.0)
Alkaline Phosphatase: 134 U/L — ABNORMAL HIGH (ref 38–126)
Anion gap: 6 (ref 5–15)
BUN: 10 mg/dL (ref 8–23)
CO2: 29 mmol/L (ref 22–32)
Calcium: 10.3 mg/dL (ref 8.9–10.3)
Chloride: 98 mmol/L (ref 98–111)
Creatinine, Ser: 0.5 mg/dL (ref 0.44–1.00)
GFR, Estimated: >60 mL/min (ref >60)
Glucose, Bld: 415 mg/dL — ABNORMAL HIGH (ref 70–99)
Potassium: 4.7 mmol/L (ref 3.5–5.1)
Sodium: 133 mmol/L — ABNORMAL LOW (ref 135–145)
Total Bilirubin: 0.8 mg/dL (ref 0.3–1.2)
Total Protein: 7 g/dL (ref 6.5–8.1)

## 2021-12-17 LAB — CBC WITH DIFFERENTIAL/PLATELET
Abs Immature Granulocytes: 0.05 10*3/uL (ref 0.00–0.07)
Basophils Absolute: 0 10*3/uL (ref 0.0–0.1)
Basophils Relative: 1 %
Eosinophils Absolute: 0.1 10*3/uL (ref 0.0–0.5)
Eosinophils Relative: 2 %
HCT: 41.3 % (ref 36.0–46.0)
Hemoglobin: 13.2 g/dL (ref 12.0–15.0)
Immature Granulocytes: 1 %
Lymphocytes Relative: 21 %
Lymphs Abs: 1.2 10*3/uL (ref 0.7–4.0)
MCH: 26.7 pg (ref 26.0–34.0)
MCHC: 32 g/dL (ref 30.0–36.0)
MCV: 83.6 fL (ref 80.0–100.0)
Monocytes Absolute: 0.5 10*3/uL (ref 0.1–1.0)
Monocytes Relative: 8 %
Neutro Abs: 4 10*3/uL (ref 1.7–7.7)
Neutrophils Relative %: 67 %
Platelets: 210 10*3/uL (ref 150–400)
RBC: 4.94 MIL/uL (ref 3.87–5.11)
RDW: 13.7 % (ref 11.5–15.5)
WBC: 5.9 10*3/uL (ref 4.0–10.5)
nRBC: 0 % (ref 0.0–0.2)

## 2021-12-17 MED ORDER — SODIUM CHLORIDE 0.9 % IV SOLN
Freq: Once | INTRAVENOUS | Status: AC
  Filled 2021-12-17: qty 250

## 2021-12-17 MED ORDER — SODIUM CHLORIDE 0.9 % IV SOLN
200.0000 mg | Freq: Once | INTRAVENOUS | Status: AC
  Administered 2021-12-17: 200 mg via INTRAVENOUS
  Filled 2021-12-17: qty 8

## 2021-12-17 NOTE — Patient Instructions (Signed)
[facility]  Discharge Instructions: °Thank you for choosing [facility] to provide your oncology and hematology care.  °If you have a lab appointment with the Cancer Center, please go directly to the Cancer Center and check in at the registration area. ° °Wear comfortable clothing and clothing appropriate for easy access to any Portacath or PICC line.  ° °We strive to give you quality time with your provider. You may need to reschedule your appointment if you arrive late (15 or more minutes).  Arriving late affects you and other patients whose appointments are after yours.  Also, if you miss three or more appointments without notifying the office, you may be dismissed from the clinic at the provider’s discretion.    °  °For prescription refill requests, have your pharmacy contact our office and allow 72 hours for refills to be completed.   ° °Today you received the following chemotherapy and/or immunotherapy agents Keytruda °    °  °To help prevent nausea and vomiting after your treatment, we encourage you to take your nausea medication as directed. ° °BELOW ARE SYMPTOMS THAT SHOULD BE REPORTED IMMEDIATELY: °*FEVER GREATER THAN 100.4 F (38 °C) OR HIGHER °*CHILLS OR SWEATING °*NAUSEA AND VOMITING THAT IS NOT CONTROLLED WITH YOUR NAUSEA MEDICATION °*UNUSUAL SHORTNESS OF BREATH °*UNUSUAL BRUISING OR BLEEDING °*URINARY PROBLEMS (pain or burning when urinating, or frequent urination) °*BOWEL PROBLEMS (unusual diarrhea, constipation, pain near the anus) °TENDERNESS IN MOUTH AND THROAT WITH OR WITHOUT PRESENCE OF ULCERS (sore throat, sores in mouth, or a toothache) °UNUSUAL RASH, SWELLING OR PAIN  °UNUSUAL VAGINAL DISCHARGE OR ITCHING  ° °Items with * indicate a potential emergency and should be followed up as soon as possible or go to the Emergency Department if any problems should occur. ° °Please show the CHEMOTHERAPY ALERT CARD or IMMUNOTHERAPY ALERT CARD at check-in to  the Emergency Department and triage nurse. ° °Should you have questions after your visit or need to cancel or reschedule your appointment, please contact [facility]  [phone] and follow the prompts.  Office hours are 8:00 a.m. to 4:30 p.m. Monday - Friday. Please note that voicemails left after 4:00 p.m. may not be returned until the following business day.  We are closed weekends and major holidays. You have access to a nurse at all times for urgent questions. Please call the main number to the clinic [phone] and follow the prompts. ° °For any non-urgent questions, you may also contact your provider using MyChart. We now offer e-Visits for anyone 18 and older to request care online for non-urgent symptoms. For details visit [email or web address]. °  °Also download the MyChart app! Go to the app store, search "MyChart", open the app, select [facility], and log in with your MyChart username and password. ° °Due to Covid, a mask is required upon entering the hospital/clinic. If you do not have a mask, one will be given to you upon arrival. For doctor visits, patients may have 1 support person aged 18 or older with them. For treatment visits, patients cannot have anyone with them due to current Covid guidelines and our immunocompromised population.  °

## 2021-12-17 NOTE — Progress Notes (Signed)
HR 111, Glucose 415. Per [facility] Nurse., CMA, per Dr. Tasia Catchings, proceed with treatment and add 1L NS IVF.  ?

## 2021-12-17 NOTE — Progress Notes (Signed)
?Hematology/Oncology Progress note ?[phone] [phone] ?  ? ? ? ?Patient Care Team: ?Pcp, No as PCP - General ?[name], MD as Consulting Physician (Hematology and Oncology) ? ?REFERRING PROVIDER: ?[name], MD  ?CHIEF COMPLAINTS/REASON FOR VISIT:  ?Metastatic RCC ? ?HISTORY OF PRESENTING ILLNESS:  ? ?Patient is a poor historian. Her mood appears to be depressed.  ?She currently lives at assisted living facility. Moved from [place]. Widowed 3 years ago.  ?Her power of attorney is [name] who is her husband's niece. [name] was called during this encounter.  ? ?Extensive medical record review was performed. She has had multiple images done during the past few months, ?05/27/20 CT head wo contrast- no acute intracranial abnormality.  ?06/01/20 CT abdomen and pelvis with contrast ?4 mm proximal left ureteral calculus causing mild proximal left- sided hydroureteronephrosis with periureteric and perinephric stranding on the left.5.2cm enhancing right mid anterior kidney mass. Mildly enlarged right retroperitoneal retrocaval lymph nodes suspicious for nodal metastases ?Mild cardiomegaly, Small hiatal hernia. Status post gastric bypass surgery and cholecystectomy. ?Postoperative changes anterior abdominal wall. ?06/05/20 CT head wo contrast - No acute intracranial abnormality. Probable tiny right frontal scalp Contusion. 2. Mild global volume loss and minimal chronic ischemic small vessel disease. ?06/06/20 CT abdomen pelvis wo contrast ?1. Indeterminate lesion in the right kidney which is stable may be a solid mass., 2. Partially calcified uterine fibroids. ?3. Left UVJ calculus measuring 5 mm ?06/07/20 CT head wo contrast.  ?1. No acute intracranial abnormality. No significant change. 2. Intracranial atherosclerosis, atrophy and chronic small vessel ischemic changes. 3. Chronic/subacute right maxillary sinus disease. ? ?06/07/20 Cystoscopy by [name] for Ureteroscopic stone  extraction ?Admitted on 10/3/21due to failure to thrive,  anorexia, dehydration, electrolyte imbalances.  ? ?06/09/20 CT abdomen and chest with contrast ?1. Right renal mass is stable in size. Most suggestive of a renal neoplasm. 2. Right retroperitoneal lymph nodes are stable in size; may well be metastatic. ?3. Left hydronephrosis and hydroureter is stable. Pelvis is not imaged therefore if there is a distal calculus still ?present, it is not visible. ?4. Sclerotic lesions in the anterior left seventh, eighth and ninth ribs which given distribution are most likely healed ?rib fractures. ? ?She is on Eliquis 51m Bid for atrial fibrillation. She reports not taking anti depressants that were given by her PCP. ? ?#11/04/2020, PET scan was obtained. ?PET scan showed interval increasing size of right upper lobe pulmonary lesion, now 3 cm with an SUV of 3.8.  Associated with a new right lower lobe nodule 8 mm.  5.1 cm right renal mass hypermetabolic activity.  1.9 retrocaval lymph node with SUV of 4.8.  Second index retrocaval node with SUV of 2.  Nodular disease seen in the duodenum on previous study is not well demonstrated on noncontrast CT image today.  Some nodular FDG activity, nonspecific. ?Multiple anterior abdominal soft tissue nodules are evident.  13 mm anterior left nodule shows no substantial hypermetabolic with an SUV of 1.7.  Calcified left lateral nodule with an SUV of 2.9.  2.2 cm calcified nodule lobe right paracolic gutter with an SUV of 2.5. ? ?12/17/2020, right kidney mass biopsy pathology was reviewed and discussed with patient.  Pathology is consistent with RCC-demonstrates the morphology spectrum, papillary, solid and nested areas.  CD10 positive, racemase positive, CK7 negative.  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  TFE 3 break apart FISH testing is pending. ? ?#01/05/2021, addendum pathology ?TFE3 break-apart FISH  testing was performed and a translocation  ?involving  Xp11 / TFE3 transcription gene was not detected. HMB-45 IHC is negative  ?CAIX IHC demonstrates membranous staining.  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 ?Omniseq SETD2 S11109f, VHL T1258f TMB 5.4, MS stable, PD-L1 <1%, ADORA2A ? ?01/08/22 started on Keytruda ?02/20/2022 added Axitinib 57m71mID ?03/06/2021-03/16/2021, patient was admitted due to acute delirium secondary to UTI/hematuria.  MRI brain was obtained during the admission and was negative for acute process.  Patient mental status improved.  UTI was treated with IV antibiotics and switched to p.o. antibiotic upon discharge.  Patient also was found to have rapid ventricular response with her atrial fibrillation.  Rate was treated with Cardizem drip during hospitalization because and switched to Toprol and Cardizem for rate control.  Eliquis for anticoagulation. ?Patient's axitinib was held during hospitalization. ? ?There was plan for patient to restart axitinib upon discharge.  However since patient was sent to rehab, he is not able to be started on axitinib due to lack of access to her medication supply.  She is going to be discharged in 2 days back to [facility]sisted living ? ?Patient presents for post hospitalization follow-up.  She does not have much recollection of her recent admission.  Reports feeling well today.  He denies any fever, chills, nausea vomiting diarrhea. ? ?03/07/2021, CT abdomen pelvis  ?Right renal mass slightly enlarged since 10/03/2020.  5.4 x 4.9 x 6.4 comparing to previously 5.0 x 4.4 x 6.3.  Stable adjacent right retroperitoneal lymphadenopathy.  There is a new 1.5 cm nodule in the right lower lobe.  Mild stranding around the right renal mass. ? ?04/01/2022 resumed Axitinib 57mg51mD ?05/06/2022 she presented for follow up, she stopped " all her medications". Tachycardia, hyperglycemia, she declined ER evaluation.  ?06/03/2022, presented for follow up.   ?Multiple no show to her CT scan. Palliative care service has not been able to reach her ? ?06/23/2021, CT chest abdomen pelvis showed stable large right anterior renal mass.  Stable to slightly larger right-sided retroperitoneal adenopathy.  Waxing and waning pulmonary nodules.  2 lower lobe nodules resolved.  New right middle lobe and right upper lobe nodules. ? ?06/29/2022  Hold chemotherapy due to hyperglycemia.  Blood sugar was 431, due to noncompliance.  Recommend patient to go to emergency room for evaluation and she declines. ?talked to patient's PCP TracOlivia Mackier the phone and discussed patient's case with her. Patient is not complaint with her medication. Her diabetes was poorly controlled and glucose was extremely high, she refused to go to ER for management. She is also extremely depressed. Most recent CT scan shows stable disease. We both agree that patient is not able to take care of herself.  TracOlivia Mackiel try to get her to see psychiatrist for evaluation of her depression,she may benefit from living in a facility as well. From Oncology aspect, her prognosis is poor, and I will hold off additional treatment until her other acute medical conditions are stabilized. Hospice/palliative care is reasonable if her condition continues to deteriorate. TracOlivia Mackiel update me if she improves. .  ? ?07/23/2021 - 07/31/2021, hospitalized due to A. fib with RVR. ?09/09/2021 - 09/14/2021, the patient due to A. fib with RVR, uncontrolled diabetes with glucose of 500s, DKA. ?09/09/2021, CT angio chest PE protocol showed interval decrease of the right upper lobe nodularity.  No PE. ? ?10/09/2021 , re-establish care, she expressed interests of resuming  RCC treatments. She agrees with getting CT restaging. She reports being complaint with her medication.  ? ?INTERVAL HISTORY ?Merit Gadsby is a 76 y.o. female who has above history reviewed by me today presents for follow up visit for management of right renal clear-cell  carcinoma ?Patient rescheduled her appointment to today. ?She reports that she is taking all her medication ?With further questioning, she took Lantus this morning and yesterday morning.  Appears not compliant with evening doses.  [employer]

## 2022-01-07 ENCOUNTER — Inpatient Hospital Stay: Payer: Medicare PPO

## 2022-01-07 ENCOUNTER — Other Ambulatory Visit: Payer: Self-pay

## 2022-01-07 ENCOUNTER — Inpatient Hospital Stay: Payer: Medicare PPO | Admitting: Oncology

## 2022-01-07 ENCOUNTER — Encounter: Payer: Self-pay | Admitting: Oncology

## 2022-01-07 ENCOUNTER — Inpatient Hospital Stay: Payer: Medicare PPO | Attending: Oncology

## 2022-01-07 VITALS — BP 113/80 | HR 72 | Temp 96.4°F | Wt 150.0 lb

## 2022-01-07 VITALS — BP 142/69 | HR 75 | Temp 96.2°F | Resp 18

## 2022-01-07 DIAGNOSIS — C641 Malignant neoplasm of right kidney, except renal pelvis: Secondary | ICD-10-CM

## 2022-01-07 DIAGNOSIS — F32A Depression, unspecified: Secondary | ICD-10-CM | POA: Insufficient documentation

## 2022-01-07 DIAGNOSIS — Z5112 Encounter for antineoplastic immunotherapy: Secondary | ICD-10-CM | POA: Insufficient documentation

## 2022-01-07 DIAGNOSIS — E1165 Type 2 diabetes mellitus with hyperglycemia: Secondary | ICD-10-CM | POA: Insufficient documentation

## 2022-01-07 DIAGNOSIS — Z91199 Patient's noncompliance with other medical treatment and regimen due to unspecified reason: Secondary | ICD-10-CM

## 2022-01-07 DIAGNOSIS — I4891 Unspecified atrial fibrillation: Secondary | ICD-10-CM | POA: Insufficient documentation

## 2022-01-07 DIAGNOSIS — R739 Hyperglycemia, unspecified: Secondary | ICD-10-CM

## 2022-01-07 DIAGNOSIS — Z91148 Patient's other noncompliance with medication regimen for other reason: Secondary | ICD-10-CM | POA: Insufficient documentation

## 2022-01-07 DIAGNOSIS — Z7901 Long term (current) use of anticoagulants: Secondary | ICD-10-CM | POA: Diagnosis not present

## 2022-01-07 DIAGNOSIS — Z794 Long term (current) use of insulin: Secondary | ICD-10-CM | POA: Diagnosis not present

## 2022-01-07 DIAGNOSIS — C649 Malignant neoplasm of unspecified kidney, except renal pelvis: Secondary | ICD-10-CM

## 2022-01-07 DIAGNOSIS — Z79899 Other long term (current) drug therapy: Secondary | ICD-10-CM | POA: Insufficient documentation

## 2022-01-07 LAB — COMPREHENSIVE METABOLIC PANEL
ALT: 10 U/L (ref 0–44)
AST: 17 U/L (ref 15–41)
Albumin: 3.3 g/dL — ABNORMAL LOW (ref 3.5–5.0)
Alkaline Phosphatase: 101 U/L (ref 38–126)
Anion gap: 10 (ref 5–15)
BUN: 19 mg/dL (ref 8–23)
CO2: 24 mmol/L (ref 22–32)
Calcium: 10.4 mg/dL — ABNORMAL HIGH (ref 8.9–10.3)
Chloride: 104 mmol/L (ref 98–111)
Creatinine, Ser: 0.54 mg/dL (ref 0.44–1.00)
GFR, Estimated: >60 mL/min (ref >60)
Glucose, Bld: 181 mg/dL — ABNORMAL HIGH (ref 70–99)
Potassium: 3.7 mmol/L (ref 3.5–5.1)
Sodium: 138 mmol/L (ref 135–145)
Total Bilirubin: 0.4 mg/dL (ref 0.3–1.2)
Total Protein: 6.3 g/dL — ABNORMAL LOW (ref 6.5–8.1)

## 2022-01-07 LAB — CBC WITH DIFFERENTIAL/PLATELET
Abs Immature Granulocytes: 0.04 10*3/uL (ref 0.00–0.07)
Basophils Absolute: 0 10*3/uL (ref 0.0–0.1)
Basophils Relative: 1 %
Eosinophils Absolute: 0.2 10*3/uL (ref 0.0–0.5)
Eosinophils Relative: 3 %
HCT: 39 % (ref 36.0–46.0)
Hemoglobin: 12.2 g/dL (ref 12.0–15.0)
Immature Granulocytes: 1 %
Lymphocytes Relative: 27 %
Lymphs Abs: 1.6 10*3/uL (ref 0.7–4.0)
MCH: 26.4 pg (ref 26.0–34.0)
MCHC: 31.3 g/dL (ref 30.0–36.0)
MCV: 84.4 fL (ref 80.0–100.0)
Monocytes Absolute: 0.7 10*3/uL (ref 0.1–1.0)
Monocytes Relative: 11 %
Neutro Abs: 3.3 10*3/uL (ref 1.7–7.7)
Neutrophils Relative %: 57 %
Platelets: 233 10*3/uL (ref 150–400)
RBC: 4.62 MIL/uL (ref 3.87–5.11)
RDW: 14.3 % (ref 11.5–15.5)
WBC: 5.8 10*3/uL (ref 4.0–10.5)
nRBC: 0 % (ref 0.0–0.2)

## 2022-01-07 LAB — CBG MONITORING, ED: Glucose-Capillary: 166 mg/dL — ABNORMAL HIGH (ref 70–99)

## 2022-01-07 LAB — TSH: TSH: 2.539 u[IU]/mL (ref 0.350–4.500)

## 2022-01-07 MED ORDER — SODIUM CHLORIDE 0.9 % IV SOLN
200.0000 mg | Freq: Once | INTRAVENOUS | Status: AC
  Administered 2022-01-07: 200 mg via INTRAVENOUS
  Filled 2022-01-07: qty 8

## 2022-01-07 MED ORDER — SODIUM CHLORIDE 0.9 % IV SOLN
Freq: Once | INTRAVENOUS | Status: AC
  Filled 2022-01-07: qty 250

## 2022-01-07 NOTE — Patient Instructions (Signed)
[facility] [place]  Discharge Instructions: ?Thank you for choosing [facility] to provide your oncology and hematology care.  ?If you have a lab appointment with the [facility], please go directly to the [facility] and check in at the registration area. ? ?Wear comfortable clothing and clothing appropriate for easy access to any Portacath or PICC line.  ? ?We strive to give you quality time with your provider. You may need to reschedule your appointment if you arrive late (15 or more minutes).  Arriving late affects you and other patients whose appointments are after yours.  Also, if you miss three or more appointments without notifying the office, you may be dismissed from the clinic at the provider?s discretion.    ?  ?For prescription refill requests, have your pharmacy contact our office and allow 72 hours for refills to be completed.   ? ?Today you received the following chemotherapy and/or immunotherapy agents keytruda    ?  ?To help prevent nausea and vomiting after your treatment, we encourage you to take your nausea medication as directed. ? ?BELOW ARE SYMPTOMS THAT SHOULD BE REPORTED IMMEDIATELY: ?*FEVER GREATER THAN 100.4 F (38 ?C) OR HIGHER ?*CHILLS OR SWEATING ?*NAUSEA AND VOMITING THAT IS NOT CONTROLLED WITH YOUR NAUSEA MEDICATION ?*UNUSUAL SHORTNESS OF BREATH ?*UNUSUAL BRUISING OR BLEEDING ?*URINARY PROBLEMS (pain or burning when urinating, or frequent urination) ?*BOWEL PROBLEMS (unusual diarrhea, constipation, pain near the anus) ?TENDERNESS IN MOUTH AND THROAT WITH OR WITHOUT PRESENCE OF ULCERS (sore throat, sores in mouth, or a toothache) ?UNUSUAL RASH, SWELLING OR PAIN  ?UNUSUAL VAGINAL DISCHARGE OR ITCHING  ? ?Items with * indicate a potential emergency and should be followed up as soon as possible or go to the Emergency Department if any problems should occur. ? ?Please show the CHEMOTHERAPY ALERT CARD or IMMUNOTHERAPY ALERT CARD at check-in to  the Emergency Department and triage nurse. ? ?Should you have questions after your visit or need to cancel or reschedule your appointment, please contact [facility] [place] [facility] [place]  [phone] and follow the prompts.  Office hours are 8:00 a.m. to 4:30 p.m. Monday - Friday. Please note that voicemails left after 4:00 p.m. may not be returned until the following business day.  We are closed weekends and major holidays. You have access to a nurse at all times for urgent questions. Please call the main number to the clinic [phone] and follow the prompts. ? ?For any non-urgent questions, you may also contact your provider using MyChart. We now offer e-Visits for anyone 27 and older to request care online for non-urgent symptoms. For details visit [email or web address]. ?  ?Also download the MyChart app! Go to the app store, search "MyChart", open the app, select [facility], and log in with your MyChart username and password. ? ?Due to Covid, a mask is required upon entering the hospital/clinic. If you do not have a mask, one will be given to you upon arrival. For doctor visits, patients may have 1 support person aged 22 or older with them. For treatment visits, patients cannot have anyone with them due to current Covid guidelines and our immunocompromised population.  ? ? ?Pembrolizumab injection ?What is this medication? ?PEMBROLIZUMAB (pem broe liz ue mab) is a monoclonal antibody. It is used to treat certain types of cancer. ?This medicine may be used for other purposes; ask your health care provider or pharmacist if you have questions. ?COMMON BRAND NAME(S): Keytruda ?What should I tell my care  team before I take this medication? ?They need to know if you have any of these conditions: ?autoimmune diseases like Crohn's disease, ulcerative colitis, or lupus ?have had or planning to have an allogeneic stem cell transplant (uses someone else's stem cells) ?history of organ transplant ?history  of chest radiation ?nervous system problems like myasthenia gravis or Guillain-Barre syndrome ?an unusual or allergic reaction to pembrolizumab, other medicines, foods, dyes, or preservatives ?pregnant or trying to get pregnant ?breast-feeding ?How should I use this medication? ?This medicine is for infusion into a vein. It is given by a health care professional in a hospital or clinic setting. ?A special MedGuide will be given to you before each treatment. Be sure to read this information carefully each time. ?Talk to your pediatrician regarding the use of this medicine in children. While this drug may be prescribed for children as young as 6 months for selected conditions, precautions do apply. ?Overdosage: If you think you have taken too much of this medicine contact a poison control center or emergency room at once. ?NOTE: This medicine is only for you. Do not share this medicine with others. ?What if I miss a dose? ?It is important not to miss your dose. Call your doctor or health care professional if you are unable to keep an appointment. ?What may interact with this medication? ?Interactions have not been studied. ?This list may not describe all possible interactions. Give your health care provider a list of all the medicines, herbs, non-prescription drugs, or dietary supplements you use. Also tell them if you smoke, drink alcohol, or use illegal drugs. Some items may interact with your medicine. ?What should I watch for while using this medication? ?Your condition will be monitored carefully while you are receiving this medicine. ?You may need blood work done while you are taking this medicine. ?Do not become pregnant while taking this medicine or for 4 months after stopping it. Women should inform their doctor if they wish to become pregnant or think they might be pregnant. There is a potential for serious side effects to an unborn child. Talk to your health care professional or pharmacist for more  information. Do not breast-feed an infant while taking this medicine or for 4 months after the last dose. ?What side effects may I notice from receiving this medication? ?Side effects that you should report to your doctor or health care professional as soon as possible: ?allergic reactions like skin rash, itching or hives, swelling of the face, lips, or tongue ?bloody or black, tarry ?breathing problems ?changes in vision ?chest pain ?chills ?confusion ?constipation ?cough ?diarrhea ?dizziness or feeling faint or lightheaded ?fast or irregular heartbeat ?fever ?flushing ?joint pain ?low blood counts - this medicine may decrease the number of white blood cells, red blood cells and platelets. You may be at increased risk for infections and bleeding. ?muscle pain ?muscle weakness ?pain, tingling, numbness in the hands or feet ?persistent headache ?redness, blistering, peeling or loosening of the skin, including inside the mouth ?signs and symptoms of high blood sugar such as dizziness; dry mouth; dry skin; fruity breath; nausea; stomach pain; increased hunger or thirst; increased urination ?signs and symptoms of kidney injury like trouble passing urine or change in the amount of urine ?signs and symptoms of liver injury like dark urine, light-colored stools, loss of appetite, nausea, right upper belly pain, yellowing of the eyes or skin ?sweating ?swollen lymph nodes ?weight loss ?Side effects that usually do not require medical attention (report to your doctor   or health care professional if they continue or are bothersome): ?decreased appetite ?hair loss ?tiredness ?This list may not describe all possible side effects. Call your doctor for medical advice about side effects. You may report side effects to FDA at 1-800-FDA-1088. ?Where should I keep my medication? ?This drug is given in a hospital or clinic and will not be stored at home. ?NOTE: This sheet is a summary. It may not cover all possible information. If you  have questions about this medicine, talk to your doctor, pharmacist, or health care provider. ?? 2023 Elsevier/Gold Standard (2021-07-24 00:00:00) ? ?

## 2022-01-07 NOTE — ED Notes (Signed)
Spoke with [name] from the cancer center and states a mistake was made and they are coming back to get the Barbara Frank to try and complete her chemo treatment today, and ask that the Barbara Frank does not get charged for an ER visit today ?

## 2022-01-07 NOTE — Progress Notes (Signed)
?Hematology/Oncology Progress note ?[phone] [phone] ?  ? ? ? ?Patient Care Team: ?Pcp, No as PCP - General ?[name], MD as Consulting Physician (Hematology and Oncology) ? ?REFERRING PROVIDER: ?[name], MD  ?CHIEF COMPLAINTS/REASON FOR VISIT:  ?Metastatic RCC ? ?HISTORY OF PRESENTING ILLNESS:  ? ?Patient is a poor historian. Her mood appears to be depressed.  ?She currently lives at assisted living facility. Moved from [place]. Widowed 3 years ago.  ?Her power of attorney is Barbara Frank who is her husband's niece. Barbara Frank was called during this encounter.  ? ?Extensive medical record review was performed. She has had multiple images done during the past few months, ?05/27/20 CT head wo contrast- no acute intracranial abnormality.  ?06/01/20 CT abdomen and pelvis with contrast ?4 mm proximal left ureteral calculus causing mild proximal left- sided hydroureteronephrosis with periureteric and perinephric stranding on the left.5.2cm enhancing right mid anterior kidney mass. Mildly enlarged right retroperitoneal retrocaval lymph nodes suspicious for nodal metastases ?Mild cardiomegaly, Small hiatal hernia. Status post gastric bypass surgery and cholecystectomy. ?Postoperative changes anterior abdominal wall. ?06/05/20 CT head wo contrast - No acute intracranial abnormality. Probable tiny right frontal scalp Contusion. 2. Mild global volume loss and minimal chronic ischemic small vessel disease. ?06/06/20 CT abdomen pelvis wo contrast ?1. Indeterminate lesion in the right kidney which is stable may be a solid mass., 2. Partially calcified uterine fibroids. ?3. Left UVJ calculus measuring 5 mm ?06/07/20 CT head wo contrast.  ?1. No acute intracranial abnormality. No significant change. 2. Intracranial atherosclerosis, atrophy and chronic small vessel ischemic changes. 3. Chronic/subacute right maxillary sinus disease. ? ?06/07/20 Cystoscopy by Barbara Frank for Ureteroscopic stone  extraction ?Admitted on 10/3/21due to failure to thrive,  anorexia, dehydration, electrolyte imbalances.  ? ?06/09/20 CT abdomen and chest with contrast ?1. Right renal mass is stable in size. Most suggestive of a renal neoplasm. 2. Right retroperitoneal lymph nodes are stable in size; may well be metastatic. ?3. Left hydronephrosis and hydroureter is stable. Pelvis is not imaged therefore if there is a distal calculus still ?present, it is not visible. ?4. Sclerotic lesions in the anterior left seventh, eighth and ninth ribs which given distribution are most likely healed ?rib fractures. ? ?She is on Eliquis 51m Bid for atrial fibrillation. She reports not taking anti depressants that were given by her PCP. ? ?#11/04/2020, PET scan was obtained. ?PET scan showed interval increasing size of right upper lobe pulmonary lesion, now 3 cm with an SUV of 3.8.  Associated with a new right lower lobe nodule 8 mm.  5.1 cm right renal mass hypermetabolic activity.  1.9 retrocaval lymph node with SUV of 4.8.  Second index retrocaval node with SUV of 2.  Nodular disease seen in the duodenum on previous study is not well demonstrated on noncontrast CT image today.  Some nodular FDG activity, nonspecific. ?Multiple anterior abdominal soft tissue nodules are evident.  13 mm anterior left nodule shows no substantial hypermetabolic with an SUV of 1.7.  Calcified left lateral nodule with an SUV of 2.9.  2.2 cm calcified nodule lobe right paracolic gutter with an SUV of 2.5. ? ?12/17/2020, right kidney mass biopsy pathology was reviewed and discussed with patient.  Pathology is consistent with RCC-demonstrates the morphology spectrum, papillary, solid and nested areas.  CD10 positive, racemase positive, CK7 negative.  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  TFE 3 break apart FISH testing is pending. ? ?#01/05/2021, addendum pathology ?TFE3 break-apart FISH  testing was performed and a translocation  ?involving  Xp11 / TFE3 transcription gene was not detected. HMB-45 IHC is negative  ?CAIX IHC demonstrates membranous staining.  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 ?Omniseq SETD2 S11109f, VHL T1258f TMB 5.4, MS stable, PD-L1 <1%, ADORA2A ? ?01/08/22 started on Keytruda ?02/20/2022 added Axitinib 57m71mID ?03/06/2021-03/16/2021, patient was admitted due to acute delirium secondary to UTI/hematuria.  MRI brain was obtained during the admission and was negative for acute process.  Patient mental status improved.  UTI was treated with IV antibiotics and switched to p.o. antibiotic upon discharge.  Patient also was found to have rapid ventricular response with her atrial fibrillation.  Rate was treated with Cardizem drip during hospitalization because and switched to Toprol and Cardizem for rate control.  Eliquis for anticoagulation. ?Patient's axitinib was held during hospitalization. ? ?There was plan for patient to restart axitinib upon discharge.  However since patient was sent to rehab, he is not able to be started on axitinib due to lack of access to her medication supply.  She is going to be discharged in 2 days back to [facility]sisted living ? ?Patient presents for post hospitalization follow-up.  She does not have much recollection of her recent admission.  Reports feeling well today.  He denies any fever, chills, nausea vomiting diarrhea. ? ?03/07/2021, CT abdomen pelvis  ?Right renal mass slightly enlarged since 10/03/2020.  5.4 x 4.9 x 6.4 comparing to previously 5.0 x 4.4 x 6.3.  Stable adjacent right retroperitoneal lymphadenopathy.  There is a new 1.5 cm nodule in the right lower lobe.  Mild stranding around the right renal mass. ? ?04/01/2022 resumed Axitinib 57mg51mD ?05/06/2022 she presented for follow up, she stopped " all her medications". Tachycardia, hyperglycemia, she declined ER evaluation.  ?06/03/2022, presented for follow up.   ?Multiple no show to her CT scan. Palliative care service has not been able to reach her ? ?06/23/2021, CT chest abdomen pelvis showed stable large right anterior renal mass.  Stable to slightly larger right-sided retroperitoneal adenopathy.  Waxing and waning pulmonary nodules.  2 lower lobe nodules resolved.  New right middle lobe and right upper lobe nodules. ? ?06/29/2022  Hold chemotherapy due to hyperglycemia.  Blood sugar was 431, due to noncompliance.  Recommend patient to go to emergency room for evaluation and she declines. ?talked to patient's PCP TracOlivia Mackier the phone and discussed patient's case with her. Patient is not complaint with her medication. Her diabetes was poorly controlled and glucose was extremely high, she refused to go to ER for management. She is also extremely depressed. Most recent CT scan shows stable disease. We both agree that patient is not able to take care of herself.  TracOlivia Mackiel try to get her to see psychiatrist for evaluation of her depression,she may benefit from living in a facility as well. From Oncology aspect, her prognosis is poor, and I will hold off additional treatment until her other acute medical conditions are stabilized. Hospice/palliative care is reasonable if her condition continues to deteriorate. TracOlivia Mackiel update me if she improves. .  ? ?07/23/2021 - 07/31/2021, hospitalized due to A. fib with RVR. ?09/09/2021 - 09/14/2021, the patient due to A. fib with RVR, uncontrolled diabetes with glucose of 500s, DKA. ?09/09/2021, CT angio chest PE protocol showed interval decrease of the right upper lobe nodularity.  No PE. ? ?10/09/2021 , re-establish care, she expressed interests of resuming  RCC treatments. She agrees with getting CT restaging. She reports being complaint with her medication.  ? ?10/28/21 CT showed progression of right renal mass, retroperitoneal lymphadenopathy. Stable small lung nodules. Cardiomegaly with biatrial dilatation. Aortic atherosclerosis.  calcifications of the aortic valve, Dilatation of the pulmonic trunk  ? ?INTERVAL HISTORY ?Barbara Frank is a 76 y.o. female who has above history reviewed by me today presents for follow up visit for management of right renal cl

## 2022-01-07 NOTE — ED Notes (Signed)
Called and spoke with the labs and the cancer center and they are having the provider there put in the orders for the to put the results in for her labs there today.  ?

## 2022-01-07 NOTE — ED Triage Notes (Signed)
Pt brought over from the cancer center for hyperglycemia, pt states she was going for routine treatment and labs today and they said her glucose was over 400. ?

## 2022-01-07 NOTE — Progress Notes (Signed)
Pt tolerated keytruda infusion well with no problems or complaints.  Pt left infusion suite stable and ambulatory with her walker.  ?

## 2022-01-14 ENCOUNTER — Ambulatory Visit: Payer: Medicare PPO | Admitting: Oncology

## 2022-01-14 ENCOUNTER — Ambulatory Visit: Payer: Medicare PPO

## 2022-01-14 ENCOUNTER — Other Ambulatory Visit: Payer: Medicare PPO

## 2022-01-19 ENCOUNTER — Ambulatory Visit
Admission: RE | Admit: 2022-01-19 | Discharge: 2022-01-19 | Disposition: A | Discharge status: Home / Self Care | Payer: Medicare PPO | Source: Ambulatory Visit | Attending: Oncology | Admitting: Oncology

## 2022-01-19 DIAGNOSIS — C641 Malignant neoplasm of right kidney, except renal pelvis: Secondary | ICD-10-CM | POA: Diagnosis not present

## 2022-01-19 MED ORDER — IOHEXOL 300 MG/ML  SOLN
80.0000 mL | Freq: Once | INTRAMUSCULAR | Status: AC | PRN
  Administered 2022-01-19: 80 mL via INTRAVENOUS

## 2022-01-28 ENCOUNTER — Inpatient Hospital Stay: Payer: Medicare PPO | Admitting: Oncology

## 2022-01-28 ENCOUNTER — Inpatient Hospital Stay: Payer: Medicare PPO

## 2022-01-28 ENCOUNTER — Encounter: Payer: Self-pay | Admitting: Oncology

## 2022-01-28 VITALS — BP 157/115 | HR 141 | Temp 97.0°F | Resp 18 | Wt 156.6 lb

## 2022-01-28 DIAGNOSIS — Z5112 Encounter for antineoplastic immunotherapy: Secondary | ICD-10-CM

## 2022-01-28 DIAGNOSIS — C641 Malignant neoplasm of right kidney, except renal pelvis: Secondary | ICD-10-CM | POA: Diagnosis not present

## 2022-01-28 DIAGNOSIS — F32A Depression, unspecified: Secondary | ICD-10-CM

## 2022-01-28 DIAGNOSIS — I4891 Unspecified atrial fibrillation: Secondary | ICD-10-CM

## 2022-01-28 DIAGNOSIS — Z91199 Patient's noncompliance with other medical treatment and regimen due to unspecified reason: Secondary | ICD-10-CM

## 2022-01-28 LAB — COMPREHENSIVE METABOLIC PANEL
ALT: 11 U/L (ref 0–44)
AST: 18 U/L (ref 15–41)
Albumin: 3.4 g/dL — ABNORMAL LOW (ref 3.5–5.0)
Alkaline Phosphatase: 115 U/L (ref 38–126)
Anion gap: 5 (ref 5–15)
BUN: 17 mg/dL (ref 8–23)
CO2: 27 mmol/L (ref 22–32)
Calcium: 10.3 mg/dL (ref 8.9–10.3)
Chloride: 105 mmol/L (ref 98–111)
Creatinine, Ser: 0.53 mg/dL (ref 0.44–1.00)
GFR, Estimated: >60 mL/min (ref >60)
Glucose, Bld: 204 mg/dL — ABNORMAL HIGH (ref 70–99)
Potassium: 4.3 mmol/L (ref 3.5–5.1)
Sodium: 137 mmol/L (ref 135–145)
Total Bilirubin: 0.5 mg/dL (ref 0.3–1.2)
Total Protein: 6.6 g/dL (ref 6.5–8.1)

## 2022-01-28 LAB — CBC WITH DIFFERENTIAL/PLATELET
Abs Immature Granulocytes: 0.04 10*3/uL (ref 0.00–0.07)
Basophils Absolute: 0 10*3/uL (ref 0.0–0.1)
Basophils Relative: 1 %
Eosinophils Absolute: 0.1 10*3/uL (ref 0.0–0.5)
Eosinophils Relative: 1 %
HCT: 39.9 % (ref 36.0–46.0)
Hemoglobin: 12.7 g/dL (ref 12.0–15.0)
Immature Granulocytes: 1 %
Lymphocytes Relative: 21 %
Lymphs Abs: 1.3 10*3/uL (ref 0.7–4.0)
MCH: 26.7 pg (ref 26.0–34.0)
MCHC: 31.8 g/dL (ref 30.0–36.0)
MCV: 83.8 fL (ref 80.0–100.0)
Monocytes Absolute: 0.4 10*3/uL (ref 0.1–1.0)
Monocytes Relative: 7 %
Neutro Abs: 4.2 10*3/uL (ref 1.7–7.7)
Neutrophils Relative %: 69 %
Platelets: 208 10*3/uL (ref 150–400)
RBC: 4.76 MIL/uL (ref 3.87–5.11)
RDW: 14.2 % (ref 11.5–15.5)
WBC: 6 10*3/uL (ref 4.0–10.5)
nRBC: 0 % (ref 0.0–0.2)

## 2022-01-28 NOTE — Progress Notes (Signed)
Patient here for follow up. Pt's bp elevated today. Pt reports she has not taken BP medication.

## 2022-01-29 ENCOUNTER — Encounter: Payer: Self-pay | Admitting: Oncology

## 2022-01-29 DIAGNOSIS — Z91199 Patient's noncompliance with other medical treatment and regimen due to unspecified reason: Secondary | ICD-10-CM | POA: Insufficient documentation

## 2022-01-29 NOTE — Progress Notes (Signed)
Hematology/Oncology Progress note [phone] [phone]      Patient Care Team: System, Provider Not In as PCP - General [name], MD as Consulting Physician (Hematology and Oncology)  REFERRING PROVIDER: [name], MD  CHIEF COMPLAINTS/REASON FOR VISIT:  Metastatic RCC  HISTORY OF PRESENTING ILLNESS:   Patient is a poor historian. Her mood appears to be depressed.  She currently lives at assisted living facility. Moved from [place]. Widowed 3 years ago.  Her power of attorney is [name] who is her husband's niece. Barbara Frank was called during this encounter.   Extensive medical record review was performed. She has had multiple images done during the past few months, 05/27/20 CT head wo contrast- no acute intracranial abnormality.  06/01/20 CT abdomen and pelvis with contrast 4 mm proximal left ureteral calculus causing mild proximal left- sided hydroureteronephrosis with periureteric and perinephric stranding on the left.5.2cm enhancing right mid anterior kidney mass. Mildly enlarged right retroperitoneal retrocaval lymph nodes suspicious for nodal metastases Mild cardiomegaly, Small hiatal hernia. Status post gastric bypass surgery and cholecystectomy. Postoperative changes anterior abdominal wall. 06/05/20 CT head wo contrast - No acute intracranial abnormality. Probable tiny right frontal scalp Contusion. 2. Mild global volume loss and minimal chronic ischemic small vessel disease. 06/06/20 CT abdomen pelvis wo contrast 1. Indeterminate lesion in the right kidney which is stable may be a solid mass., 2. Partially calcified uterine fibroids. 3. Left UVJ calculus measuring 5 mm 06/07/20 CT head wo contrast.  1. No acute intracranial abnormality. No significant change. 2. Intracranial atherosclerosis, atrophy and chronic small vessel ischemic changes. 3. Chronic/subacute right maxillary sinus disease.  06/07/20 Cystoscopy by [name] for Ureteroscopic stone  extraction Admitted on 10/3/21due to failure to thrive,  anorexia, dehydration, electrolyte imbalances.   06/09/20 CT abdomen and chest with contrast 1. Right renal mass is stable in size. Most suggestive of a renal neoplasm. 2. Right retroperitoneal lymph nodes are stable in size; may well be metastatic. 3. Left hydronephrosis and hydroureter is stable. Pelvis is not imaged therefore if there is a distal calculus still present, it is not visible. 4. Sclerotic lesions in the anterior left seventh, eighth and ninth ribs which given distribution are most likely healed rib fractures.  She is on Eliquis 38m Bid for atrial fibrillation. She reports not taking anti depressants that were given by her PCP.  #11/04/2020, PET scan was obtained. PET scan showed interval increasing size of right upper lobe pulmonary lesion, now 3 cm with an SUV of 3.8.  Associated with a new right lower lobe nodule 8 mm.  5.1 cm right renal mass hypermetabolic activity.  1.9 retrocaval lymph node with SUV of 4.8.  Second index retrocaval node with SUV of 2.  Nodular disease seen in the duodenum on previous study is not well demonstrated on noncontrast CT image today.  Some nodular FDG activity, nonspecific. Multiple anterior abdominal soft tissue nodules are evident.  13 mm anterior left nodule shows no substantial hypermetabolic with an SUV of 1.7.  Calcified left lateral nodule with an SUV of 2.9.  2.2 cm calcified nodule lobe right paracolic gutter with an SUV of 2.5.  12/17/2020, right kidney mass biopsy pathology was reviewed and discussed with patient.  Pathology is consistent with RCC-demonstrates the morphology spectrum, papillary, solid and nested areas.  CD10 positive, racemase positive, CK7 negative.  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  TFE 3 break apart FISH testing is pending.  #01/05/2021, addendum pathology TFE3  break-apart FISH testing was performed and a translocation  involving  Xp11 / TFE3 transcription gene was not detected. HMB-45 IHC is negative  CAIX IHC demonstrates membranous staining.  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  MRI brain was obtained during the admission and was negative for acute process.  Patient mental status improved.  UTI was treated with IV antibiotics and switched to p.o. antibiotic upon discharge.  Patient also was found to have rapid ventricular response with her atrial fibrillation.  Rate was treated with Cardizem drip during hospitalization because and switched to Toprol and Cardizem for rate control.  Eliquis for anticoagulation. Patient's axitinib was held during hospitalization.  There was plan for patient to restart axitinib upon discharge.  However since patient was sent to rehab, he is not able to be started on axitinib due to lack of access to her medication supply.  [facility]  Patient presents for post hospitalization follow-up.  She does not have much recollection of her recent admission.  Reports feeling well today.  He denies any fever, chills, nausea vomiting diarrhea.  03/07/2021, CT abdomen pelvis  Right renal mass slightly enlarged since 10/03/2020.  5.4 x 4.9 x 6.4 comparing to previously 5.0 x 4.4 x 6.3.  Stable adjacent right retroperitoneal lymphadenopathy.  There is a new 1.5 cm nodule in the right lower lobe.  Mild stranding around the right renal mass.  04/01/2022 resumed Axitinib 46mg246mD 05/06/2022 she presented for follow up, she stopped " all her medications". Tachycardia, hyperglycemia, she declined ER evaluation.  06/03/2022, presented for follow up.   Multiple no show to her CT scan. Palliative care service has not been able to reach her  06/23/2021, CT chest abdomen pelvis showed stable large right anterior renal mass.  Stable to slightly larger right-sided retroperitoneal adenopathy.  Waxing and waning pulmonary nodules.  2 lower lobe nodules resolved.  New right middle lobe and right upper lobe nodules.  06/29/2022  Hold chemotherapy due to hyperglycemia.  Blood sugar was 431, due to noncompliance.  Recommend patient to go to emergency room for evaluation and she declines. talked to patient's PCP TracOlivia Mackier the phone and discussed patient's case with her. Patient is not complaint with her medication. Her diabetes was poorly controlled and glucose was extremely high, she refused to go to ER for management. She is also extremely depressed. Most recent CT scan shows stable disease. We both agree that patient is not able to take care of herself.  TracOlivia Mackiel try to get her to see psychiatrist for evaluation of her depression,she may benefit from living in a facility as well. From Oncology aspect, her prognosis is poor, and I will hold off additional treatment until her other acute medical conditions are stabilized. Hospice/palliative care is reasonable if her condition continues to deteriorate. TracOlivia Mackiel update me if she improves. .   07/23/2021 - 07/31/2021, hospitalized due to A. fib with RVR. 09/09/2021 - 09/14/2021, the patient due to A. fib with RVR, uncontrolled diabetes with glucose of 500s, DKA. 09/09/2021, CT angio chest PE protocol showed interval decrease of the right upper lobe nodularity.  No PE.  10/09/2021 , re-establish care, she expressed interests  of resuming RCC treatments. She agrees with getting CT restaging. She reports being complaint with her medication.   10/28/21 CT showed progression of right renal mass, retroperitoneal lymphadenopathy. Stable small lung nodules. Cardiomegaly with biatrial dilatation. Aortic atherosclerosis.  calcifications of the aortic valve, Dilatation of the pulmonic trunk   INTERVAL HISTORY Barbara Frank is a 76 y.o. female who has above history reviewed by me today presents for follow up visit for management of metastatic renal clear-cell carcinoma During the interval, patient has had CT scan done. 01/20/2022, CT chest abdomen pelvis with contrast showed heterogeneous mass of the anterior midportion of the right kidney is slightly increased in size, as is an adjacent right retroperitoneal lymph node.  Consistent with disease progression.  Findings are consistent with worsened renal cell carcinoma and adjacent nodal metastatic disease. New pulmonary nodule of the superior segment right lower lobe measuring 0.7 cm, highly suspicious for a small metastasis. Multiple additional tiny pulmonary nodules are unchanged and likely benign and incidental. Attention on follow-up. Coronary artery disease.  Today patient reports that she is not taking her blood pressure medication. She has a heart rate of 141 in the clinic.  Mild lightheadedness, no focal neurologic deficits.  Her brother-in-law recently passed away.  She is upset that she was not notified by her niece. Review of Systems  Constitutional:  Positive for fatigue. Negative for appetite change, chills and fever.  HENT:   Negative for hearing loss and voice change.   Eyes:  Negative for eye problems.  Respiratory:  Negative for chest tightness and cough.   Cardiovascular:  Negative for chest pain.  Gastrointestinal:  Negative for abdominal distention, abdominal pain, blood in stool and constipation.  Endocrine: Negative for hot flashes.  Genitourinary:  Negative for difficulty urinating and frequency.   Musculoskeletal:  Negative for arthralgias.  Skin:  Negative for itching and rash.  Neurological:  Negative for extremity weakness.  Hematological:  Negative for adenopathy.  Psychiatric/Behavioral:  Positive for depression. Negative for  confusion.    MEDICAL HISTORY:  Past Medical History:  Diagnosis Date   A-fib ([place])    Depression    Diabetes mellitus without complication ([place])    History of kidney stones    Kidney mass     SURGICAL HISTORY: Past Surgical History:  Procedure Laterality Date   GASTRIC BYPASS     HERNIA REPAIR     TONSILLECTOMY     URETEROSCOPY WITH HOLMIUM LASER LITHOTRIPSY Left     SOCIAL HISTORY: Social History   Socioeconomic History   Marital status: Widowed    Spouse name: Not on file   Number of children: Not on file   Years of education: Not on file   Highest education level: Not on file  Occupational History   Not on file  Tobacco Use   Smoking status: Never   Smokeless tobacco: Never  Vaping Use   Vaping Use: Never used  Substance and Sexual Activity   Alcohol use: Not Currently   Drug use: Not Currently   Sexual activity: Not Currently  Other Topics Concern   Not on file  Social History Narrative   Not on file   Social Determinants of Health   Financial Resource Strain: Not on file  Food Insecurity: Not on file  Transportation Needs: Not on file  Physical Activity: Not on file  Stress: Not on file  Social Connections: Not on file  Intimate Partner Violence: Not on file    FAMILY HISTORY: Family History  Problem Relation Age of Onset   Breast cancer Mother    Bladder Cancer Neg Hx    Kidney cancer Neg Hx    Prostate cancer Neg Hx     ALLERGIES:  is allergic to kiwi extract.  MEDICATIONS:  Current Outpatient Medications  Medication Sig Dispense Refill   apixaban (ELIQUIS) 5 MG TABS tablet Take 1 tablet (5 mg total) by mouth 2 (two) times daily. 60 tablet 2   carvedilol (COREG) 6.25 MG tablet Take 1 tablet (6.25 mg total) by mouth 2 (two) times daily with a meal. 60 tablet 2   cetirizine (ZYRTEC) 10 MG tablet Take 0.5 tablets (5 mg total) by mouth daily. 15 tablet 0   diltiazem (CARDIZEM CD) 240 MG 24 hr capsule Take 1 capsule (240 mg total) by mouth  every evening. 30 capsule 2   escitalopram (LEXAPRO) 5 MG tablet Take 1 tablet (5 mg total) by mouth daily. 30 tablet 0   insulin aspart (NOVOLOG) 100 UNIT/ML injection Inject 0-15 Units into the skin 3 (three) times daily with meals. CBG 70 - 120: 0 units,  121 - 150: 2 units, 151 - 200: 3 units,  201 - 250: 5 units,  251 - 300: 8 units, 301 - 350: 11 units 351 - 400: 15 units, CBG > 400: call MD and obtain STAT lab verification (Patient not taking: Reported on 01/07/2022) 10 mL 11   insulin aspart (NOVOLOG) 100 UNIT/ML injection Inject 4 Units into the skin 3 (three) times daily with meals. (Patient not taking: Reported on 10/30/2021) 10 mL 0   insulin glargine (LANTUS) 100 UNIT/ML injection Inject 0.27 mLs (27 Units total) into the skin 2 (two) times daily. 10 mL 11   metFORMIN (GLUCOPHAGE-XR) 500 MG 24 hr tablet Take 1 tablet (500 mg total) by mouth 2 (two) times daily. 60 tablet 2   metoprolol succinate (TOPROL-XL) 25 MG 24 hr tablet Take 25 mg by mouth daily.     No current facility-administered medications for this visit.     PHYSICAL EXAMINATION: ECOG PERFORMANCE STATUS: 2 - Symptomatic, <50% confined to bed Vitals:   01/28/22 0937  BP: (!) 157/115  Pulse: (!) 141  Resp: 18  Temp: (!) 97 F (36.1 C)   Filed Weights   01/28/22 0937  Weight: 156 lb 9.6 oz (71 kg)    Physical Exam Constitutional:      General: She is not in acute distress.    Comments: Frail elderly female, she ambulates with a walker.   HENT:     Head: Normocephalic and atraumatic.  Eyes:     General: No scleral icterus. Cardiovascular:     Rate and Rhythm: Tachycardia present. Rhythm irregular.     Heart sounds: Normal heart sounds.  Pulmonary:     Effort: Pulmonary effort is normal. No respiratory distress.     Breath sounds: No wheezing.  Abdominal:     General: Bowel sounds are normal. There is no distension.     Palpations: Abdomen is soft.  Musculoskeletal:        General: No deformity. Normal  range of motion.     Cervical back: Normal range of motion and neck supple.  Skin:    General: Skin is warm and dry.     Findings: No erythema or rash.  Neurological:     Mental Status: She is alert and oriented to person, place, and time.     Cranial Nerves: No cranial nerve deficit.     Coordination:  Coordination normal.  Psychiatric:     Comments: Depressed    LABORATORY DATA:  I have reviewed the data as listed Lab Results  Component Value Date   WBC 6.0 01/28/2022   HGB 12.7 01/28/2022   HCT 39.9 01/28/2022   MCV 83.8 01/28/2022   PLT 208 01/28/2022   Recent Labs    12/17/21 0832 01/07/22 0853 01/28/22 0913  NA 133* 138 137  K 4.7 3.7 4.3  CL 98 104 105  CO2 '29 24 27  ' GLUCOSE 415* 181* 204*  BUN '10 19 17  ' CREATININE 0.50 0.54 0.53  CALCIUM 10.3 10.4* 10.3  GFRNONAA >60 >60 >60  PROT 7.0 6.3* 6.6  ALBUMIN 3.6 3.3* 3.4*  AST '15 17 18  ' ALT '10 10 11  ' ALKPHOS 134* 101 115  BILITOT 0.8 0.4 0.5    Iron/TIBC/Ferritin/ %Sat No results found for: IRON, TIBC, FERRITIN, IRONPCTSAT    RADIOGRAPHIC STUDIES: I have personally reviewed the radiological images as listed and agreed with the findings in the report. CT CHEST ABDOMEN PELVIS W CONTRAST  Result Date: 01/20/2022 CLINICAL DATA:  Metastatic renal cell carcinoma, assess treatment response, ongoing chemotherapy * Tracking Code: BO * EXAM: CT CHEST, ABDOMEN, AND PELVIS WITH CONTRAST TECHNIQUE: Multidetector CT imaging of the chest, abdomen and pelvis was performed following the standard protocol during bolus administration of intravenous contrast. RADIATION DOSE REDUCTION: This exam was performed according to the departmental dose-optimization program which includes automated exposure control, adjustment of the mA and/or kV according to patient size and/or use of iterative reconstruction technique. CONTRAST:  95m OMNIPAQUE IOHEXOL 300 MG/ML SOLN, additional oral enteric contrast COMPARISON:  10/27/2021 FINDINGS: CT  CHEST FINDINGS Cardiovascular: Aortic atherosclerosis. Normal heart size. Left coronary artery calcifications. No pericardial effusion. Mediastinum/Nodes: No enlarged mediastinal, hilar, or axillary lymph nodes. Thyroid gland, trachea, and esophagus demonstrate no significant findings. Lungs/Pleura: New pulmonary nodule of the superior segment right lower lobe measuring 0.7 cm (series 4, image 79). Multiple additional tiny pulmonary nodules are unchanged and most likely benign, in addition to several calcified and definitively benign small nodules. New nonspecific subcentimeter infectious or inflammatory ground-glass opacity of the peripheral right upper lobe (series 4, image 54). No pleural effusion or pneumothorax. Musculoskeletal: No chest wall mass or suspicious osseous lesions identified. CT ABDOMEN PELVIS FINDINGS Hepatobiliary: No focal liver abnormality is seen. Status post cholecystectomy. No biliary dilatation. Pancreas: Unremarkable. No pancreatic ductal dilatation or surrounding inflammatory changes. Spleen: Normal in size without significant abnormality. Adrenals/Urinary Tract: Adrenal glands are unremarkable. Heterogeneous mass of the anterior midportion of the right kidney is slightly increased in size, measuring 7.7 x 6.1 cm, previously 7.2 x 6.1 cm (series 2, image 60). This mass appears to compress although does not clearly invade the right renal vein (series 2, image 59). The left kidney is normal, without renal calculi, solid lesion, or hydronephrosis. Bladder is unremarkable. Stomach/Bowel: Status post Roux type gastric bypass. Appendix appears normal. No evidence of bowel wall thickening, distention, or inflammatory changes. Vascular/Lymphatic: No significant vascular findings are present. Interval enlargement of a right retroperitoneal lymph node measuring 2.6 x 2.3 cm, previously 1.9 x 1.6 cm (series 2, image 57). Reproductive: Calcified uterine fibroids. Other: Midline ventral hernia repair.   No ascites. Musculoskeletal: No acute osseous findings. IMPRESSION: 1. Heterogeneous mass of the anterior midportion of the right kidney is slightly increased in size, as is an adjacent right retroperitoneal lymph node. Findings are consistent with worsened renal cell carcinoma and adjacent nodal metastatic disease. 2. New  pulmonary nodule of the superior segment right lower lobe measuring 0.7 cm, highly suspicious for a small metastasis. Multiple additional tiny pulmonary nodules are unchanged and likely benign and incidental. Attention on follow-up. 3. Coronary artery disease. Aortic Atherosclerosis (ICD10-I70.0). Electronically Signed   By: Delanna Ahmadi M.D.   On: 01/20/2022 09:44      ASSESSMENT & PLAN:  1. Renal cell carcinoma of right kidney ([place])   2. Encounter for antineoplastic immunotherapy   3. Atrial fibrillation with RVR ([place])   4. Depression, unspecified depression type   5. Noncompliance    #Right kidney mass with retroperitoneal lymphadenopathy, lung mass and peritoneal nodularity Labs are reviewed and discussed with patient. Hold Keytruda. 01/20/2022, CT chest abdomen pelvis with contrast showed some disease progression. I think adding axitinib could potentially salvage her condition.  However her medical noncompliance is the major barrier. She likely would not be compliant with axitinib as she is not even taking her other medications.  Recommend patient to make a follow-up appointment with to see our palliative care service for further discussion.  #Atrial fibrillation with RVR.  Likely secondary to medication noncompliance being off Cardizem.  I recommend patient go to emergency room for further evaluation and treatments.  Patient declined.  She says " if you sent me to the emergency room today, I am going to sit on the floor and cry".    #Medication noncompliance, she said that she intends to take her medications however sometimes confused by the instructions.  She says she  used to have someone help her to feel the pillbox.  Now insurance does not cover anymore.  I communicated with patient's primary care provider Olivia Mackie and updated her.  Per Olivia Mackie, patient has not been compliant taking her medication, checking blood sugar.  DSS is involved and used to fill her pillbox. HH could not be justified and re-cert not obtained due to her medical noncompliance.   #Uncontrolled diabetes, noncompliant with insulin.  #Severe depression She has not establish care with psychiatry.  All questions were answered. The patient knows to call the clinic with any problems questions or concerns.  Follow up  Appointment with palliative care service.  [name], MD, PhD Hematology Oncology  01/29/2022

## 2022-02-02 ENCOUNTER — Telehealth: Payer: Self-pay | Admitting: *Deleted

## 2022-02-02 NOTE — Telephone Encounter (Signed)
Call from patient stating the reason she missed her appointment this morning is because she had an episode of explosive diarrhea which was all over her carpet and also all over her bathroom. I do not see that she has an appointment today, it is Thursday. I explained this to her and she thanked me for letting her know. I asked if she was still having diarrhea and she said no. It just came on fast and was over

## 2022-02-04 ENCOUNTER — Telehealth: Payer: Self-pay | Admitting: Hospice and Palliative Medicine

## 2022-02-04 ENCOUNTER — Inpatient Hospital Stay: Payer: Medicare PPO | Attending: Oncology | Admitting: Hospice and Palliative Medicine

## 2022-02-04 ENCOUNTER — Telehealth: Payer: Self-pay | Admitting: Student

## 2022-02-04 VITALS — BP 128/80 | HR 87 | Temp 98.0°F | Resp 16

## 2022-02-04 DIAGNOSIS — Z7901 Long term (current) use of anticoagulants: Secondary | ICD-10-CM | POA: Diagnosis not present

## 2022-02-04 DIAGNOSIS — E119 Type 2 diabetes mellitus without complications: Secondary | ICD-10-CM | POA: Diagnosis not present

## 2022-02-04 DIAGNOSIS — Z794 Long term (current) use of insulin: Secondary | ICD-10-CM | POA: Diagnosis not present

## 2022-02-04 DIAGNOSIS — I4891 Unspecified atrial fibrillation: Secondary | ICD-10-CM | POA: Diagnosis not present

## 2022-02-04 DIAGNOSIS — C641 Malignant neoplasm of right kidney, except renal pelvis: Secondary | ICD-10-CM | POA: Diagnosis present

## 2022-02-04 DIAGNOSIS — Z91148 Patient's other noncompliance with medication regimen for other reason: Secondary | ICD-10-CM | POA: Insufficient documentation

## 2022-02-04 DIAGNOSIS — Z515 Encounter for palliative care: Secondary | ICD-10-CM | POA: Diagnosis not present

## 2022-02-04 DIAGNOSIS — F32A Depression, unspecified: Secondary | ICD-10-CM | POA: Insufficient documentation

## 2022-02-04 DIAGNOSIS — Z79899 Other long term (current) drug therapy: Secondary | ICD-10-CM | POA: Insufficient documentation

## 2022-02-04 NOTE — Progress Notes (Signed)
[place]  [phone] [phone]   Name: Barbara Frank Date: 02/04/2022 MRN: [id number]  DOB: 1945/09/21  Patient Care Team: System, Provider Not In as PCP - General [name], MD as Consulting Physician (Hematology and Oncology)    REASON FOR CONSULTATION: Barbara Frank is a 76 y.o. female with multiple medical problems including A. fib on Eliquis, depression, and diabetes, who was admitted in October 2021 in [facility] for failure to thrive.  Work-up including abdominal CT chest/abdomen/pelvis revealed a right renal mass and retroperitoneal adenopathy concerning for renal cell carcinoma.  Patient saw Dr. Tasia Catchings and was initially not interested in pursuing aggressive diagnostic work-up or treatment.  Patient later agreed to systemic treatment and was treated with immunotherapy.  Recent imaging revealed disease progression.  She was referred to palliative care to discuss goals.  SOCIAL HISTORY:     reports that she has never smoked. She has never used smokeless tobacco. She reports that she does not currently use alcohol. She reports that she does not currently use drugs.  Patient was previously living near [facility] in a motel. Patient now lives in [facility].  She is widowed 4 years ago.  She has no children.  Her only living family is her husband's niece.  Patient is a retired [employer] cabin crew.  ADVANCE DIRECTIVES:  Patient's HC POA husband's niece, [name]  CODE STATUS:   PAST MEDICAL HISTORY: Past Medical History:  Diagnosis Date   A-fib ([place])    Depression    Diabetes mellitus without complication ([place])    History of kidney stones    Kidney mass     PAST SURGICAL HISTORY:  Past Surgical History:  Procedure Laterality Date   GASTRIC BYPASS     HERNIA REPAIR     TONSILLECTOMY     URETEROSCOPY WITH HOLMIUM LASER LITHOTRIPSY Left     HEMATOLOGY/ONCOLOGY HISTORY:   Oncology History  Renal cell carcinoma ([place])  09/26/2020 Initial Diagnosis   Renal cell carcinoma ([place])   12/24/2020 Cancer Staging   Staging form: Kidney, AJCC 8th Edition - Clinical stage from 12/24/2020: Stage IV (cT1b, cN1, cM1) - Signed by [name], MD on 12/24/2020   01/08/2021 -  Chemotherapy   Patient is on Treatment Plan : RENAL CELL Pembrolizumab        ALLERGIES:  is allergic to kiwi extract.  MEDICATIONS:  Current Outpatient Medications  Medication Sig Dispense Refill   apixaban (ELIQUIS) 5 MG TABS tablet Take 1 tablet (5 mg total) by mouth 2 (two) times daily. 60 tablet 2   diltiazem (CARDIZEM CD) 240 MG 24 hr capsule Take 1 capsule (240 mg total) by mouth every evening. 30 capsule 2   escitalopram (LEXAPRO) 5 MG tablet Take 1 tablet (5 mg total) by mouth daily. 30 tablet 0   insulin glargine (LANTUS) 100 UNIT/ML injection Inject 0.27 mLs (27 Units total) into the skin 2 (two) times daily. 10 mL 11   metFORMIN (GLUCOPHAGE-XR) 500 MG 24 hr tablet Take 1 tablet (500 mg total) by mouth 2 (two) times daily. 60 tablet 2   metoprolol succinate (TOPROL-XL) 25 MG 24 hr tablet Take 25 mg by mouth daily.     carvedilol (COREG) 6.25 MG tablet Take 1 tablet (6.25 mg total) by mouth 2 (two) times daily with a meal. 60 tablet 2   cetirizine (ZYRTEC) 10 MG tablet Take 0.5 tablets (5 mg total) by mouth daily. (Patient not  taking: Reported on 02/04/2022) 15 tablet 0   insulin aspart (NOVOLOG) 100 UNIT/ML injection Inject 0-15 Units into the skin 3 (three) times daily with meals. CBG 70 - 120: 0 units,  121 - 150: 2 units, 151 - 200: 3 units,  201 - 250: 5 units,  251 - 300: 8 units, 301 - 350: 11 units 351 - 400: 15 units, CBG > 400: call MD and obtain STAT lab verification (Patient not taking: Reported on 01/07/2022) 10 mL 11   insulin aspart (NOVOLOG) 100 UNIT/ML injection Inject 4 Units into the skin 3 (three) times daily with meals. (Patient not taking: Reported on 10/30/2021) 10 mL 0   No  current facility-administered medications for this visit.    VITAL SIGNS: BP 128/80   Pulse 87   Temp 98 F (36.7 C) (Oral)   Resp 16   SpO2 99%  There were no vitals filed for this visit.  Estimated body mass index is 28.64 kg/m as calculated from the following:   Height as of 11/24/21: 5' 2" (1.575 m).   Weight as of 01/28/22: 156 lb 9.6 oz (71 kg).  LABS: CBC:    Component Value Date/Time   WBC 6.0 01/28/2022 0913   HGB 12.7 01/28/2022 0913   HCT 39.9 01/28/2022 0913   PLT 208 01/28/2022 0913   MCV 83.8 01/28/2022 0913   NEUTROABS 4.2 01/28/2022 0913   LYMPHSABS 1.3 01/28/2022 0913   MONOABS 0.4 01/28/2022 0913   EOSABS 0.1 01/28/2022 0913   BASOSABS 0.0 01/28/2022 0913   Comprehensive Metabolic Panel:    Component Value Date/Time   NA 137 01/28/2022 0913   K 4.3 01/28/2022 0913   CL 105 01/28/2022 0913   CO2 27 01/28/2022 0913   BUN 17 01/28/2022 0913   CREATININE 0.53 01/28/2022 0913   GLUCOSE 204 (H) 01/28/2022 0913   CALCIUM 10.3 01/28/2022 0913   CALCIUM 10.4 (H) 07/28/2021 0808   AST 18 01/28/2022 0913   ALT 11 01/28/2022 0913   ALKPHOS 115 01/28/2022 0913   BILITOT 0.5 01/28/2022 0913   PROT 6.6 01/28/2022 0913   ALBUMIN 3.4 (L) 01/28/2022 0913    RADIOGRAPHIC STUDIES: CT CHEST ABDOMEN PELVIS W CONTRAST  Result Date: 01/20/2022 CLINICAL DATA:  Metastatic renal cell carcinoma, assess treatment response, ongoing chemotherapy * Tracking Code: BO * EXAM: CT CHEST, ABDOMEN, AND PELVIS WITH CONTRAST TECHNIQUE: Multidetector CT imaging of the chest, abdomen and pelvis was performed following the standard protocol during bolus administration of intravenous contrast. RADIATION DOSE REDUCTION: This exam was performed according to the departmental dose-optimization program which includes automated exposure control, adjustment of the mA and/or kV according to patient size and/or use of iterative reconstruction technique. CONTRAST:  72m OMNIPAQUE IOHEXOL 300 MG/ML  SOLN, additional oral enteric contrast COMPARISON:  10/27/2021 FINDINGS: CT CHEST FINDINGS Cardiovascular: Aortic atherosclerosis. Normal heart size. Left coronary artery calcifications. No pericardial effusion. Mediastinum/Nodes: No enlarged mediastinal, hilar, or axillary lymph nodes. Thyroid gland, trachea, and esophagus demonstrate no significant findings. Lungs/Pleura: New pulmonary nodule of the superior segment right lower lobe measuring 0.7 cm (series 4, image 79). Multiple additional tiny pulmonary nodules are unchanged and most likely benign, in addition to several calcified and definitively benign small nodules. New nonspecific subcentimeter infectious or inflammatory ground-glass opacity of the peripheral right upper lobe (series 4, image 54). No pleural effusion or pneumothorax. Musculoskeletal: No chest wall mass or suspicious osseous lesions identified. CT ABDOMEN PELVIS FINDINGS Hepatobiliary: No focal liver abnormality is seen. Status post  cholecystectomy. No biliary dilatation. Pancreas: Unremarkable. No pancreatic ductal dilatation or surrounding inflammatory changes. Spleen: Normal in size without significant abnormality. Adrenals/Urinary Tract: Adrenal glands are unremarkable. Heterogeneous mass of the anterior midportion of the right kidney is slightly increased in size, measuring 7.7 x 6.1 cm, previously 7.2 x 6.1 cm (series 2, image 60). This mass appears to compress although does not clearly invade the right renal vein (series 2, image 59). The left kidney is normal, without renal calculi, solid lesion, or hydronephrosis. Bladder is unremarkable. Stomach/Bowel: Status post Roux type gastric bypass. Appendix appears normal. No evidence of bowel wall thickening, distention, or inflammatory changes. Vascular/Lymphatic: No significant vascular findings are present. Interval enlargement of a right retroperitoneal lymph node measuring 2.6 x 2.3 cm, previously 1.9 x 1.6 cm (series 2, image 57).  Reproductive: Calcified uterine fibroids. Other: Midline ventral hernia repair.  No ascites. Musculoskeletal: No acute osseous findings. IMPRESSION: 1. Heterogeneous mass of the anterior midportion of the right kidney is slightly increased in size, as is an adjacent right retroperitoneal lymph node. Findings are consistent with worsened renal cell carcinoma and adjacent nodal metastatic disease. 2. New pulmonary nodule of the superior segment right lower lobe measuring 0.7 cm, highly suspicious for a small metastasis. Multiple additional tiny pulmonary nodules are unchanged and likely benign and incidental. Attention on follow-up. 3. Coronary artery disease. Aortic Atherosclerosis (ICD10-I70.0). Electronically Signed   By: Delanna Ahmadi M.D.   On: 01/20/2022 09:44     PERFORMANCE STATUS (ECOG) : 2 - Symptomatic, <50% confined to bed  Review of Systems Unless otherwise noted, a complete review of systems is negative.  Physical Exam General: NAD, thin, frail-appearing Pulmonary: unlabored Extremities: no edema, no joint deformities Skin: no rashes Neurological: Weakness but otherwise nonfocal  IMPRESSION: Patient was last seen by Dr. Tasia Catchings on 01/28/2022.  CTs revealed disease progression and treatment option of adding axitinib was discussed.  However, patient has been historically noncompliant with medications.  I met with patient today to discuss goals.  Patient clearly recognizes that her cancer is progressing.  We discussed the options for future treatment versus focusing on comfort/quality of life.  Patient states that she is undecided and wants time to think about things.  She does admit to needing more resources and help at her senior independent living facility.  She was previously receiving some medication assistance through DSS but this has stopped.  Patient was also receiving home health in the past but this is also stopped.  Patient says that she has thought about moving facilities and we  discussed ALF as a possible option for receiving more care.  I did ask that SW see patient today to discuss resources.  Patient says that she recently met with an attorney and had her POA changed to her husband's friend, [name].  I requested that she bring in ACP documentation.  She did not have a number with her to call Mr. Capps.  PLAN: -Continue current scope of treatment -Referral for home palliative care -Referral for social work -RTC in 2 weeks to see me and LCSW  Case and plan discussed with Dr. Tasia Catchings  Patient expressed understanding and was in agreement with this plan. She also understands that She can call the clinic at any time with any questions, concerns, or complaints.     Time Total: 25 minutes  Visit consisted of counseling and education dealing with the complex and emotionally intense issues of symptom management and palliative care in the setting of serious  and potentially life-threatening illness.Greater than 50%  of this time was spent counseling and coordinating care related to the above assessment and plan.  Signed by: Altha Harm, PhD, NP-C

## 2022-02-04 NOTE — Telephone Encounter (Signed)
Attempted to contact patient to offer to schedule a Palliative Consult, no answer - left message with reason for call along with my name and call back number requesting a return call.

## 2022-02-04 NOTE — Telephone Encounter (Signed)
VM left with patient with appointment update. SW is not available to see her on 6/15 to her appointment with Palliative has been moved to 6/14 in order to accommodate. Requested that patient call back to confirm.

## 2022-02-05 ENCOUNTER — Encounter: Payer: Self-pay | Admitting: *Deleted

## 2022-02-05 NOTE — Progress Notes (Signed)
[place] Work  Clinical Social Work was referred by  palliative care provider  for assessment of psychosocial needs.  Clinical Social Worker met with patient in exam room to offer support and assess for needs.    Barbara Frank lives alone at [facility] independent living facility.  She indicated no local family support, but has identified a family friend as her POA.  CSW and patient discussed higher levels of care to assist patient as she completes cancer treatment. CSW emphasized importance of managing medications appropriately and patient agreed assistance would be helpful.  Patient verbalized understanding this would be an out of pocket expense.  Patient is interested in home care services to maintain independent living or moving to an [place].  CSW team will follow up with patient to provide list of home care and ALF and discuss possible options with patient.      Gwinda Maine, LCSW  Clinical Social Worker [facility]

## 2022-02-09 ENCOUNTER — Telehealth: Payer: Self-pay | Admitting: Hospice and Palliative Medicine

## 2022-02-09 NOTE — Telephone Encounter (Signed)
Patient called to move Palliative appointment back to 6/15, stating that she only has transportation on Thursdays at [facility]. LCSW will not be in the clinic that day so appointment was cancelled.

## 2022-02-17 ENCOUNTER — Other Ambulatory Visit: Payer: Medicare PPO | Admitting: Licensed Clinical Social Worker

## 2022-02-17 ENCOUNTER — Encounter: Payer: Medicare PPO | Admitting: Hospice and Palliative Medicine

## 2022-02-18 ENCOUNTER — Encounter: Payer: Medicare PPO | Admitting: Hospice and Palliative Medicine

## 2022-02-18 ENCOUNTER — Inpatient Hospital Stay (HOSPITAL_BASED_OUTPATIENT_CLINIC_OR_DEPARTMENT_OTHER): Payer: Medicare PPO | Admitting: Hospice and Palliative Medicine

## 2022-02-18 ENCOUNTER — Telehealth: Payer: Self-pay | Admitting: Student

## 2022-02-18 ENCOUNTER — Encounter: Payer: Self-pay | Admitting: Hospice and Palliative Medicine

## 2022-02-18 VITALS — BP 137/65 | HR 80 | Temp 97.8°F | Resp 17 | Wt 163.0 lb

## 2022-02-18 DIAGNOSIS — Z515 Encounter for palliative care: Secondary | ICD-10-CM

## 2022-02-18 DIAGNOSIS — C641 Malignant neoplasm of right kidney, except renal pelvis: Secondary | ICD-10-CM | POA: Diagnosis not present

## 2022-02-18 NOTE — Telephone Encounter (Signed)
Rec'd call back from patient and after a lengthy discussion with her about Palliative services and all questions were answered she was in agreement with scheduling a visit.  I have scheduled an In-home Consult for 02/24/22 @ 9:30 AM

## 2022-02-18 NOTE — Progress Notes (Signed)
Patient here for oncology follow-up appointment,  concerns of chest tightness and urinary urgency

## 2022-02-18 NOTE — Telephone Encounter (Signed)
Attempted to contact patient to offer to schedule a Palliative Consult, no answer - left message with reason for call along with my name and call back number, requesting a return call to schedule visit or to let us know if she doesn't want to pursue Palliative services

## 2022-02-18 NOTE — Progress Notes (Signed)
[place]  Telephone:([phone] [phone]   Name: Barbara Frank Date: 02/18/2022 MRN: [id number]  DOB: December 15, 1945  Patient Care Team: System, Provider Not In as PCP - General [name], MD as Consulting Physician (Hematology and Oncology)    REASON FOR CONSULTATION: Barbara Frank is a 76 y.o. female with multiple medical problems including A. fib on Eliquis, depression, and diabetes, who was admitted in October 2021 in [facility] for failure to thrive.  Work-up including abdominal CT chest/abdomen/pelvis revealed a right renal mass and retroperitoneal adenopathy concerning for renal cell carcinoma.  Patient saw Dr. Tasia Catchings and was initially not interested in pursuing aggressive diagnostic work-up or treatment.  Patient later agreed to systemic treatment and was treated with immunotherapy.  Recent imaging revealed disease progression.  She was referred to palliative care to discuss goals.  SOCIAL HISTORY:     reports that she has never smoked. She has never used smokeless tobacco. She reports that she does not currently use alcohol. She reports that she does not currently use drugs.  Patient was previously living near [facility] in a motel. Patient now lives in [facility].  She is widowed 4 years ago.  She has no children.  Her only living family is her husband's niece.  Patient is a retired [employer] cabin crew.  ADVANCE DIRECTIVES:  Patient's HC POA husband's niece, [name]  CODE STATUS:   PAST MEDICAL HISTORY: Past Medical History:  Diagnosis Date   A-fib ([place])    Depression    Diabetes mellitus without complication ([place])    History of kidney stones    Kidney mass     PAST SURGICAL HISTORY:  Past Surgical History:  Procedure Laterality Date   GASTRIC BYPASS     HERNIA REPAIR     TONSILLECTOMY     URETEROSCOPY WITH HOLMIUM LASER LITHOTRIPSY Left     HEMATOLOGY/ONCOLOGY HISTORY:   Oncology History  Renal cell carcinoma ([place])  09/26/2020 Initial Diagnosis   Renal cell carcinoma ([place])   12/24/2020 Cancer Staging   Staging form: Kidney, AJCC 8th Edition - Clinical stage from 12/24/2020: Stage IV (cT1b, cN1, cM1) - Signed by [name], MD on 12/24/2020   01/08/2021 -  Chemotherapy   Patient is on Treatment Plan : RENAL CELL Pembrolizumab        ALLERGIES:  is allergic to kiwi extract.  MEDICATIONS:  Current Outpatient Medications  Medication Sig Dispense Refill   apixaban (ELIQUIS) 5 MG TABS tablet Take 1 tablet (5 mg total) by mouth 2 (two) times daily. 60 tablet 2   carvedilol (COREG) 6.25 MG tablet Take 1 tablet (6.25 mg total) by mouth 2 (two) times daily with a meal. 60 tablet 2   cetirizine (ZYRTEC) 10 MG tablet Take 0.5 tablets (5 mg total) by mouth daily. 15 tablet 0   diltiazem (CARDIZEM CD) 240 MG 24 hr capsule Take 1 capsule (240 mg total) by mouth every evening. 30 capsule 2   escitalopram (LEXAPRO) 5 MG tablet Take 1 tablet (5 mg total) by mouth daily. 30 tablet 0   insulin aspart (NOVOLOG) 100 UNIT/ML injection Inject 0-15 Units into the skin 3 (three) times daily with meals. CBG 70 - 120: 0 units,  121 - 150: 2 units, 151 - 200: 3 units,  201 - 250: 5 units,  251 - 300: 8 units, 301 - 350: 11 units 351 - 400: 15 units, CBG > 400: call MD and  obtain STAT lab verification 10 mL 11   insulin aspart (NOVOLOG) 100 UNIT/ML injection Inject 4 Units into the skin 3 (three) times daily with meals. 10 mL 0   insulin glargine (LANTUS) 100 UNIT/ML injection Inject 0.27 mLs (27 Units total) into the skin 2 (two) times daily. 10 mL 11   metFORMIN (GLUCOPHAGE-XR) 500 MG 24 hr tablet Take 1 tablet (500 mg total) by mouth 2 (two) times daily. 60 tablet 2   metoprolol succinate (TOPROL-XL) 25 MG 24 hr tablet Take 25 mg by mouth daily.     sitaGLIPtin-metformin (JANUMET) 50-1000 MG tablet Take 1 tablet by mouth 2 (two) times daily with a meal.     No current  facility-administered medications for this visit.    VITAL SIGNS: BP 137/65 (BP Location: Left Arm, Patient Position: Sitting)   Pulse 80   Temp 97.8 F (36.6 C) (Oral)   Resp 17   Wt 163 lb (73.9 kg)   SpO2 98%   BMI 29.81 kg/m  Filed Weights   02/18/22 0903  Weight: 163 lb (73.9 kg)    Estimated body mass index is 29.81 kg/m as calculated from the following:   Height as of 11/24/21: '5\' 2"'$  (1.575 m).   Weight as of this encounter: 163 lb (73.9 kg).  LABS: CBC:    Component Value Date/Time   WBC 6.0 01/28/2022 0913   HGB 12.7 01/28/2022 0913   HCT 39.9 01/28/2022 0913   PLT 208 01/28/2022 0913   MCV 83.8 01/28/2022 0913   NEUTROABS 4.2 01/28/2022 0913   LYMPHSABS 1.3 01/28/2022 0913   MONOABS 0.4 01/28/2022 0913   EOSABS 0.1 01/28/2022 0913   BASOSABS 0.0 01/28/2022 0913   Comprehensive Metabolic Panel:    Component Value Date/Time   NA 137 01/28/2022 0913   K 4.3 01/28/2022 0913   CL 105 01/28/2022 0913   CO2 27 01/28/2022 0913   BUN 17 01/28/2022 0913   CREATININE 0.53 01/28/2022 0913   GLUCOSE 204 (H) 01/28/2022 0913   CALCIUM 10.3 01/28/2022 0913   CALCIUM 10.4 (H) 07/28/2021 0808   AST 18 01/28/2022 0913   ALT 11 01/28/2022 0913   ALKPHOS 115 01/28/2022 0913   BILITOT 0.5 01/28/2022 0913   PROT 6.6 01/28/2022 0913   ALBUMIN 3.4 (L) 01/28/2022 0913    RADIOGRAPHIC STUDIES: No results found.   PERFORMANCE STATUS (ECOG) : 2 - Symptomatic, <50% confined to bed  Review of Systems Unless otherwise noted, a complete review of systems is negative.  Physical Exam General: NAD, thin, frail-appearing Pulmonary: unlabored Extremities: no edema, no joint deformities Skin: no rashes Neurological: Weakness but otherwise nonfocal  IMPRESSION: Patient was last seen by Dr. Tasia Catchings on 01/28/2022.  CTs revealed disease progression and treatment option of adding axitinib was discussed.  However, patient has been historically noncompliant with  medications.  Follow-up visit to discuss goals.  Patient denies significant changes.  She does endorse occasional "heart fluttering" primarily in the mornings.  She denies chest pain or shortness of breath.  I suspect the patient is not consistently taking her cardiac meds.  She does not have a cardiologist locally.  We will send referral to cardiology.  Patient says that she is interested in pursuing further cancer treatments.  We discussed how her historical noncompliance might interfere with appropriate cancer management.  We will schedule follow-up with Dr. Tasia Catchings the patient will need to bring in her HCPOA [name]) to also discuss.  She says that Mr. Capps and his  girlfriend would be available to assist with medication management.  PLAN: -Continue current scope of treatment -Referral for home palliative care -RTC 2 weeks to see Dr. Tasia Catchings, [place], and me  Case and plan discussed with Dr. Tasia Catchings  Patient expressed understanding and was in agreement with this plan. She also understands that She can call the clinic at any time with any questions, concerns, or complaints.     Time Total: 25 minutes  Visit consisted of counseling and education dealing with the complex and emotionally intense issues of symptom management and palliative care in the setting of serious and potentially life-threatening illness.Greater than 50%  of this time was spent counseling and coordinating care related to the above assessment and plan.  Signed by: Altha Harm, PhD, NP-C

## 2022-02-24 ENCOUNTER — Other Ambulatory Visit: Payer: Medicare PPO | Admitting: Student

## 2022-02-24 DIAGNOSIS — C649 Malignant neoplasm of unspecified kidney, except renal pelvis: Secondary | ICD-10-CM

## 2022-02-24 DIAGNOSIS — Z91148 Patient's other noncompliance with medication regimen for other reason: Secondary | ICD-10-CM

## 2022-02-24 DIAGNOSIS — Z515 Encounter for palliative care: Secondary | ICD-10-CM

## 2022-02-24 DIAGNOSIS — E119 Type 2 diabetes mellitus without complications: Secondary | ICD-10-CM

## 2022-02-24 NOTE — Progress Notes (Signed)
[facility] Palliative Care Consult Note Telephone: [phone]  Fax: [phone]   Date of encounter: 02/24/22 9:44 AM PATIENT NAME: Barbara Frank [place] [id number]   [phone] (home)  DOB: 01-Feb-1946 MRN: [id number] PRIMARY CARE PROVIDER:    Cathie Beams  REFERRING PROVIDER:   Irean Hong, NP [place],  [place] [address] [phone]  RESPONSIBLE PARTY:    Contact Information     Name Relation Home Work Mobile   [name] Niece   [phone]        I met face to face with patient and family in the home. Palliative Care was asked to follow this patient by consultation request of  Borders, Kirt Boys, NP to address advance care planning and complex medical decision making. This is the initial visit.                                     ASSESSMENT AND PLAN / RECOMMENDATIONS:   Advance Care Planning/Goals of Care: Goals include to maximize quality of life and symptom management. Patient/health care surrogate gave his/her permission to discuss.Our advance care planning conversation included a discussion about:    The value and importance of advance care planning  Experiences with loved ones who have been seriously ill or have died  Exploration of personal, cultural or spiritual beliefs that might influence medical decisions  Exploration of goals of care in the event of a sudden injury or illness  HCPOA [name] Capps Introduced MOST form, plant to complete on next visit. CODE STATUS: Full Code  Education provided on palliative medicine versus hospice services.  Patient would like to pursue treatment for her renal cell carcinoma; she is to follow-up with oncology.  Palliative medicine will continue to provide ongoing support and symptom management.  Requested updated medication list per PCP.  Symptom Management/Plan:  Renal Cell Carcinoma-recent imaging shows disease progression.  Patient would like to pursue treatment. She is to follow up with Oncology on 03/04/22.  T2DM- hemoglobin A1C 11.1. patient states she was told to stop her Janumet. She is taking her insulin. Requested updated medication list per PCP. Recommend HH SN for medication management and diabetic teaching.   Medication adherence-HCPOA is now doing medication box; also requested updated list from PCP as there are some discrepancies. Recommend HH SN for medication management. HCPOA [place] to [place] with [place] facility on if staff can remind patient to take medications. Recommend using a daily calendar for reminders.   Follow up Palliative Care Visit: Palliative care will continue to follow for complex medical decision making, advance care planning, and clarification of goals. Return in 4 weeks or prn.  I spent 60 minutes providing this consultation. More than 50% of the time in this consultation was spent in counseling and care coordination.   PPS: 60%  HOSPICE ELIGIBILITY/DIAGNOSIS: TBD  Chief Complaint: Palliative Medicine initial consult.   HISTORY OF PRESENT ILLNESS:  Barbara Frank is a 76 y.o. year old female  with renal cell carcinoma, atrial fibrillation or eliquis, type 2 diabetes, depression. Recent images shows disease progression 01/2022.    Patient resides at [facility]. She states she is able to complete adl's. Uses walker for ambulation. She does report having issues with taking her medications. She often sleeps in late, taking medications at different times. HCPOA has started doing her medication box. She  does not have carvedilol on hand and she states she was told to stop her Janumet. She endorses good appetite. She is checking her blood sugars twice a day. Denies having any pain, shortness of breath, nausea, constipation. A 10-point ROS is negative, except for the pertinent positives and negatives detailed per the HPI.  History obtained from review of EMR, discussion with primary team, and  interview with family, facility staff/caregiver and/or Ms. Winborne.  I reviewed available labs, medications, imaging, studies and related documents from the EMR.  Records reviewed and summarized above.   Physical Exam:  Pulse 88, resp 16, b/p 112/64, sats 98% on room air Constitutional: NAD General: frail appearing EYES: anicteric sclera, lids intact, no discharge  ENMT: intact hearing, oral mucous membranes moist CV: S1S2, RRR, no LE edema Pulmonary: LCTA, no increased work of breathing, no cough, room air Abdomen: normo-active BS + 4 quadrants, soft and non tender, no ascites GU: deferred MSK: moves all extremities, ambulatory with walker Skin: warm and dry, no rashes or wounds on visible skin Neuro:  no generalized weakness, A & O x 3, forgetful Psych: non-anxious affect, pleasant Hem/lymph/immuno: no widespread bruising CURRENT PROBLEM LIST:  Patient Active Problem List   Diagnosis Date Noted   Noncompliance 01/29/2022   A-fib ([place]) 09/09/2021   Constipation    Generalized weakness    Fall at home, initial encounter 07/23/2021   Lung nodule 07/23/2021   Elevated lactic acid level 07/23/2021   Prolonged QT interval 07/23/2021   Atrial fibrillation, chronic (HCC)    Weakness    Palliative care encounter    Acute cystitis with hematuria    Type 2 diabetes mellitus without complication, with long-term current use of insulin (HCC)    Increased ammonia level    Encephalopathy 03/07/2021   Transient confusion 03/06/2021   Intertrigo 03/06/2021   Encounter for antineoplastic chemotherapy 01/08/2021   Encounter for antineoplastic immunotherapy 01/08/2021   Goals of care, counseling/discussion 10/17/2020   Hypercalcemia 10/17/2020   Chest mass 10/17/2020   Atrial fibrillation with RVR ([place]) 09/26/2020   Depression 09/26/2020   Diabetes type 2, uncontrolled 09/26/2020   Hypokalemia 09/26/2020   Renal cell carcinoma ([place]) 09/26/2020   Acute delirium 09/26/2020   Ureteral  stone 09/26/2020   PAST MEDICAL HISTORY:  Active Ambulatory Problems    Diagnosis Date Noted   Atrial fibrillation with RVR ([place]) 09/26/2020   Depression 09/26/2020   Diabetes type 2, uncontrolled 09/26/2020   Hypokalemia 09/26/2020   Renal cell carcinoma ([place]) 09/26/2020   Acute delirium 09/26/2020   Ureteral stone 09/26/2020   Goals of care, counseling/discussion 10/17/2020   Hypercalcemia 10/17/2020   Chest mass 10/17/2020   Encounter for antineoplastic chemotherapy 01/08/2021   Encounter for antineoplastic immunotherapy 01/08/2021   Transient confusion 03/06/2021   Intertrigo 03/06/2021   Encephalopathy 03/07/2021   Acute cystitis with hematuria    Type 2 diabetes mellitus without complication, with long-term current use of insulin (HCC)    Increased ammonia level    Weakness    Palliative care encounter    Atrial fibrillation, chronic ([place])    Fall at home, initial encounter 07/23/2021   Lung nodule 07/23/2021   Elevated lactic acid level 07/23/2021   Prolonged QT interval 07/23/2021   Generalized weakness    Constipation    A-fib ([place]) 09/09/2021   Noncompliance 01/29/2022   Resolved Ambulatory Problems    Diagnosis Date Noted   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   Past Medical History:  Diagnosis Date  Diabetes mellitus without complication ([place])    History of kidney stones    Kidney mass    SOCIAL HX:  Social History   Tobacco Use   Smoking status: Never   Smokeless tobacco: Never  Substance Use Topics   Alcohol use: Not Currently   FAMILY HX:  Family History  Problem Relation Age of Onset   Breast cancer Mother    Bladder Cancer Neg Hx    Kidney cancer Neg Hx    Prostate cancer Neg Hx       ALLERGIES:  Allergies  Allergen Reactions   Kiwi Extract      PERTINENT MEDICATIONS:  Outpatient Encounter Medications as of 02/24/2022  Medication Sig   apixaban (ELIQUIS) 5 MG TABS tablet Take 1 tablet (5 mg total) by mouth 2 (two) times  daily.   carvedilol (COREG) 6.25 MG tablet Take 1 tablet (6.25 mg total) by mouth 2 (two) times daily with a meal.   cetirizine (ZYRTEC) 10 MG tablet Take 0.5 tablets (5 mg total) by mouth daily.   diltiazem (CARDIZEM CD) 240 MG 24 hr capsule Take 1 capsule (240 mg total) by mouth every evening.   escitalopram (LEXAPRO) 5 MG tablet Take 1 tablet (5 mg total) by mouth daily.   insulin aspart (NOVOLOG) 100 UNIT/ML injection Inject 0-15 Units into the skin 3 (three) times daily with meals. CBG 70 - 120: 0 units,  121 - 150: 2 units, 151 - 200: 3 units,  201 - 250: 5 units,  251 - 300: 8 units, 301 - 350: 11 units 351 - 400: 15 units, CBG > 400: call MD and obtain STAT lab verification   insulin aspart (NOVOLOG) 100 UNIT/ML injection Inject 4 Units into the skin 3 (three) times daily with meals.   insulin glargine (LANTUS) 100 UNIT/ML injection Inject 0.27 mLs (27 Units total) into the skin 2 (two) times daily.   metFORMIN (GLUCOPHAGE-XR) 500 MG 24 hr tablet Take 1 tablet (500 mg total) by mouth 2 (two) times daily.   metoprolol succinate (TOPROL-XL) 25 MG 24 hr tablet Take 25 mg by mouth daily.   sitaGLIPtin-metformin (JANUMET) 50-1000 MG tablet Take 1 tablet by mouth 2 (two) times daily with a meal.   No facility-administered encounter medications on file as of 02/24/2022.   Thank you for the opportunity to participate in the care of Ms. Pareja.  The palliative care team will continue to follow. Please call our office at [phone] if we can be of additional assistance.   Ezekiel Slocumb, NP   COVID-19 PATIENT SCREENING TOOL Asked and negative response unless otherwise noted:  Have you had symptoms of covid, tested positive or been in contact with someone with symptoms/positive test in the past 5-10 days? No

## 2022-03-04 ENCOUNTER — Inpatient Hospital Stay: Payer: Medicare PPO

## 2022-03-04 ENCOUNTER — Encounter: Payer: Self-pay | Admitting: Oncology

## 2022-03-04 ENCOUNTER — Inpatient Hospital Stay (HOSPITAL_BASED_OUTPATIENT_CLINIC_OR_DEPARTMENT_OTHER): Payer: Medicare PPO | Admitting: Oncology

## 2022-03-04 VITALS — BP 146/93 | HR 93 | Temp 98.1°F | Ht 62.0 in | Wt 168.0 lb

## 2022-03-04 DIAGNOSIS — Z91199 Patient's noncompliance with other medical treatment and regimen due to unspecified reason: Secondary | ICD-10-CM | POA: Diagnosis not present

## 2022-03-04 DIAGNOSIS — Z5112 Encounter for antineoplastic immunotherapy: Secondary | ICD-10-CM | POA: Diagnosis not present

## 2022-03-04 DIAGNOSIS — C641 Malignant neoplasm of right kidney, except renal pelvis: Secondary | ICD-10-CM

## 2022-03-04 DIAGNOSIS — Z91119 Patient's noncompliance with dietary regimen due to unspecified reason: Secondary | ICD-10-CM

## 2022-03-04 DIAGNOSIS — C649 Malignant neoplasm of unspecified kidney, except renal pelvis: Secondary | ICD-10-CM

## 2022-03-04 LAB — COMPREHENSIVE METABOLIC PANEL
ALT: 18 U/L (ref 0–44)
AST: 21 U/L (ref 15–41)
Albumin: 3.5 g/dL (ref 3.5–5.0)
Alkaline Phosphatase: 110 U/L (ref 38–126)
Anion gap: 4 — ABNORMAL LOW (ref 5–15)
BUN: 17 mg/dL (ref 8–23)
CO2: 28 mmol/L (ref 22–32)
Calcium: 9.9 mg/dL (ref 8.9–10.3)
Chloride: 102 mmol/L (ref 98–111)
Creatinine, Ser: 0.59 mg/dL (ref 0.44–1.00)
GFR, Estimated: >60 mL/min (ref >60)
Glucose, Bld: 242 mg/dL — ABNORMAL HIGH (ref 70–99)
Potassium: 5.2 mmol/L — ABNORMAL HIGH (ref 3.5–5.1)
Sodium: 134 mmol/L — ABNORMAL LOW (ref 135–145)
Total Bilirubin: 0.3 mg/dL (ref 0.3–1.2)
Total Protein: 6.4 g/dL — ABNORMAL LOW (ref 6.5–8.1)

## 2022-03-04 LAB — CBC WITH DIFFERENTIAL/PLATELET
Abs Immature Granulocytes: 0.04 10*3/uL (ref 0.00–0.07)
Basophils Absolute: 0 10*3/uL (ref 0.0–0.1)
Basophils Relative: 1 %
Eosinophils Absolute: 0.1 10*3/uL (ref 0.0–0.5)
Eosinophils Relative: 2 %
HCT: 38.2 % (ref 36.0–46.0)
Hemoglobin: 11.7 g/dL — ABNORMAL LOW (ref 12.0–15.0)
Immature Granulocytes: 1 %
Lymphocytes Relative: 21 %
Lymphs Abs: 1.2 10*3/uL (ref 0.7–4.0)
MCH: 26.2 pg (ref 26.0–34.0)
MCHC: 30.6 g/dL (ref 30.0–36.0)
MCV: 85.7 fL (ref 80.0–100.0)
Monocytes Absolute: 0.6 10*3/uL (ref 0.1–1.0)
Monocytes Relative: 9 %
Neutro Abs: 3.9 10*3/uL (ref 1.7–7.7)
Neutrophils Relative %: 66 %
Platelets: 214 10*3/uL (ref 150–400)
RBC: 4.46 MIL/uL (ref 3.87–5.11)
RDW: 13.4 % (ref 11.5–15.5)
WBC: 5.9 10*3/uL (ref 4.0–10.5)
nRBC: 0 % (ref 0.0–0.2)

## 2022-03-04 LAB — LACTATE DEHYDROGENASE: LDH: 107 U/L (ref 98–192)

## 2022-03-04 MED ORDER — AXITINIB 5 MG PO TABS: 5.0000 mg | ORAL_TABLET | Freq: Two times a day (BID) | ORAL | 0 refills | Status: DC

## 2022-03-04 NOTE — Progress Notes (Signed)
Hematology/Oncology Progress note [phone] [phone]      Patient Care Team: System, Provider Not In as PCP - General [name], MD as Consulting Physician (Hematology and Oncology)  CHIEF COMPLAINTS/REASON FOR VISIT:  Metastatic RCC  HISTORY OF PRESENTING ILLNESS:   Patient is a poor historian. Her mood appears to be depressed.  She currently lives at assisted living facility. Moved from [place]. Widowed 3 years ago.  Her power of attorney is [name] who is her husband's niece. [name] was called during this encounter.   Extensive medical record review was performed. She has had multiple images done during the past few months, 05/27/20 CT head wo contrast- no acute intracranial abnormality.  06/01/20 CT abdomen and pelvis with contrast 4 mm proximal left ureteral calculus causing mild proximal left- sided hydroureteronephrosis with periureteric and perinephric stranding on the left.5.2cm enhancing right mid anterior kidney mass. Mildly enlarged right retroperitoneal retrocaval lymph nodes suspicious for nodal metastases Mild cardiomegaly, Small hiatal hernia. Status post gastric bypass surgery and cholecystectomy. Postoperative changes anterior abdominal wall. 06/05/20 CT head wo contrast - No acute intracranial abnormality. Probable tiny right frontal scalp Contusion. 2. Mild global volume loss and minimal chronic ischemic small vessel disease. 06/06/20 CT abdomen pelvis wo contrast 1. Indeterminate lesion in the right kidney which is stable may be a solid mass., 2. Partially calcified uterine fibroids. 3. Left UVJ calculus measuring 5 mm 06/07/20 CT head wo contrast.  1. No acute intracranial abnormality. No significant change. 2. Intracranial atherosclerosis, atrophy and chronic small vessel ischemic changes. 3. Chronic/subacute right maxillary sinus disease.  06/07/20 Cystoscopy by [name] for Ureteroscopic stone extraction Admitted on 10/3/21due  to failure to thrive,  anorexia, dehydration, electrolyte imbalances.   06/09/20 CT abdomen and chest with contrast 1. Right renal mass is stable in size. Most suggestive of a renal neoplasm. 2. Right retroperitoneal lymph nodes are stable in size; may well be metastatic. 3. Left hydronephrosis and hydroureter is stable. Pelvis is not imaged therefore if there is a distal calculus still present, it is not visible. 4. Sclerotic lesions in the anterior left seventh, eighth and ninth ribs which given distribution are most likely healed rib fractures.  She is on Eliquis 67m Bid for atrial fibrillation. She reports not taking anti depressants that were given by her PCP.  #11/04/2020, PET scan was obtained. PET scan showed interval increasing size of right upper lobe pulmonary lesion, now 3 cm with an SUV of 3.8.  Associated with a new right lower lobe nodule 8 mm.  5.1 cm right renal mass hypermetabolic activity.  1.9 retrocaval lymph node with SUV of 4.8.  Second index retrocaval node with SUV of 2.  Nodular disease seen in the duodenum on previous study is not well demonstrated on noncontrast CT image today.  Some nodular FDG activity, nonspecific. Multiple anterior abdominal soft tissue nodules are evident.  13 mm anterior left nodule shows no substantial hypermetabolic with an SUV of 1.7.  Calcified left lateral nodule with an SUV of 2.9.  2.2 cm calcified nodule lobe right paracolic gutter with an SUV of 2.5.  12/17/2020, right kidney mass biopsy pathology was reviewed and discussed with patient.  Pathology is consistent with RCC-demonstrates the morphology spectrum, papillary, solid and nested areas.  CD10 positive, racemase positive, CK7 negative.  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  TFE 3 break apart FISH testing is pending.  #01/05/2021, addendum pathology TFE3 break-apart FISH testing was performed and  a translocation  involving Xp11 / TFE3 transcription gene was  not detected. HMB-45 IHC is negative  CAIX IHC demonstrates membranous staining.  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  MRI brain was obtained during the admission and was negative for acute process.  Patient mental status improved.  UTI was treated with IV antibiotics and switched to p.o. antibiotic upon discharge.  Patient also was found to have rapid ventricular response with her atrial fibrillation.  Rate was treated with Cardizem drip during hospitalization because and switched to Toprol and Cardizem for rate control.  Eliquis for anticoagulation. Patient's axitinib was held during hospitalization.  There was plan for patient to restart axitinib upon discharge.  However since patient was sent to rehab, he is not able to be started on axitinib due to lack of access to her medication supply.  [facility]  Patient presents for post hospitalization follow-up.  She does not have much recollection of her recent admission.  Reports feeling well today.  He denies any fever, chills, nausea vomiting diarrhea.  03/07/2021, CT abdomen pelvis  Right renal mass slightly enlarged since 10/03/2020.  5.4 x 4.9 x 6.4 comparing to previously 5.0 x 4.4 x 6.3.  Stable adjacent right retroperitoneal lymphadenopathy.  There is a new 1.5 cm nodule in the right lower lobe.  Mild stranding around the right renal mass.  04/01/2022 resumed Axitinib 13mg79mD 05/06/2022 she presented for follow up, she stopped " all her medications". Tachycardia, hyperglycemia, she declined ER evaluation.  06/03/2022, presented for follow up.  Multiple no show to her CT scan.  Palliative care service has not been able to reach her  06/23/2021, CT chest abdomen pelvis showed stable large right anterior renal mass.  Stable to slightly larger right-sided retroperitoneal adenopathy.  Waxing and waning pulmonary nodules.  2 lower lobe nodules resolved.  New right middle lobe and right upper lobe nodules.  06/29/2022  Hold chemotherapy due to hyperglycemia.  Blood sugar was 431, due to noncompliance.  Recommend patient to go to emergency room for evaluation and she declines. talked to patient's PCP TracOlivia Mackier the phone and discussed patient's case with her. Patient is not complaint with her medication. Her diabetes was poorly controlled and glucose was extremely high, she refused to go to ER for management. She is also extremely depressed. Most recent CT scan shows stable disease. We both agree that patient is not able to take care of herself.  TracOlivia Mackiel try to get her to see psychiatrist for evaluation of her depression,she may benefit from living in a facility as well. From Oncology aspect, her prognosis is poor, and I will hold off additional treatment until her other acute medical conditions are stabilized. Hospice/palliative care is reasonable if her condition continues to deteriorate. TracOlivia Mackiel update me if she improves. .   07/23/2021 - 07/31/2021, hospitalized due to A. fib with RVR. 09/09/2021 - 09/14/2021, the patient due to A. fib with RVR, uncontrolled diabetes with glucose of 500s, DKA. 09/09/2021, CT angio chest PE protocol showed interval decrease of the right upper lobe nodularity.  No PE.  10/09/2021 , re-establish care, she expressed interests of resuming RCC treatments. She agrees  with getting CT restaging. She reports being complaint with her medication.   10/28/21 CT showed progression of right renal mass, retroperitoneal lymphadenopathy. Stable small lung nodules. Cardiomegaly with biatrial dilatation. Aortic atherosclerosis. calcifications of the aortic valve,  Dilatation of the pulmonic trunk   01/20/2022, CT chest abdomen pelvis with contrast showed heterogeneous mass of the anterior midportion of the right kidney is slightly increased in size, as is an adjacent right retroperitoneal lymph node.  Consistent with disease progression.  Findings are consistent with worsened renal cell carcinoma and adjacent nodal metastatic disease. New pulmonary nodule of the superior segment right lower lobe measuring 0.7 cm, highly suspicious for a small metastasis. Multiple additional tiny pulmonary nodules are unchanged and likely benign and incidental. Attention on follow-up. Coronary artery disease.  INTERVAL HISTORY Barbara Frank is a 76 y.o. female who has above history reviewed by me today presents for follow up visit for management of metastatic renal clear-cell carcinoma  Today she is accompanied by her friend [name] who has become her POA recently. [name] has been helping her to be more complaint with her medication. Patient reports being complaint with her insulin.  She denies pain.   Review of Systems  Constitutional:  Positive for fatigue. Negative for appetite change, chills and fever.  HENT:   Negative for hearing loss and voice change.   Eyes:  Negative for eye problems.  Respiratory:  Negative for chest tightness and cough.   Cardiovascular:  Negative for chest pain.  Gastrointestinal:  Negative for abdominal distention, abdominal pain, blood in stool and constipation.  Endocrine: Negative for hot flashes.  Genitourinary:  Negative for difficulty urinating and frequency.   Musculoskeletal:  Negative for arthralgias.  Skin:  Negative for itching and rash.  Neurological:  Negative for extremity weakness.  Hematological:  Negative for adenopathy.  Psychiatric/Behavioral:  Positive for depression. Negative for confusion.     MEDICAL HISTORY:  Past Medical History:  Diagnosis Date   A-fib ([place])    Depression    Diabetes mellitus without  complication ([place])    History of kidney stones    Kidney mass     SURGICAL HISTORY: Past Surgical History:  Procedure Laterality Date   GASTRIC BYPASS     HERNIA REPAIR     TONSILLECTOMY     URETEROSCOPY WITH HOLMIUM LASER LITHOTRIPSY Left     SOCIAL HISTORY: Social History   Socioeconomic History   Marital status: Widowed    Spouse name: Not on file   Number of children: Not on file   Years of education: Not on file   Highest education level: Not on file  Occupational History   Not on file  Tobacco Use   Smoking status: Never   Smokeless tobacco: Never  Vaping Use   Vaping Use: Never used  Substance and Sexual Activity   Alcohol use: Not Currently   Drug use: Not Currently   Sexual activity: Not Currently  Other Topics Concern   Not on file  Social History Narrative   Not on file   Social Determinants of Health   Financial Resource Strain: Not on file  Food Insecurity: Not on file  Transportation Needs: Not on file  Physical Activity: Not on file  Stress: Not on file  Social Connections: Not on file  Intimate Partner Violence: Not on file    FAMILY HISTORY: Family History  Problem Relation Age of Onset   Breast cancer Mother    Bladder Cancer Neg Hx    Kidney  cancer Neg Hx    Prostate cancer Neg Hx     ALLERGIES:  is allergic to kiwi extract.  MEDICATIONS:  Current Outpatient Medications  Medication Sig Dispense Refill   cetirizine (ZYRTEC) 5 MG tablet Take 5 mg by mouth daily.     diltiazem (TIAZAC) 180 MG 24 hr capsule Take 180 mg by mouth daily.     escitalopram (LEXAPRO) 5 MG tablet Take 1 tablet (5 mg total) by mouth daily. (Patient taking differently: Take 10 mg by mouth daily.) 30 tablet 0   insulin aspart (NOVOLOG) 100 UNIT/ML injection Inject 0-15 Units into the skin 3 (three) times daily with meals. CBG 70 - 120: 0 units,  121 - 150: 2 units, 151 - 200: 3 units,  201 - 250: 5 units,  251 - 300: 8 units, 301 - 350: 11 units 351 - 400: 15  units, CBG > 400: call MD and obtain STAT lab verification 10 mL 11   insulin aspart (NOVOLOG) 100 UNIT/ML injection Inject 4 Units into the skin 3 (three) times daily with meals. 10 mL 0   insulin glargine (LANTUS) 100 UNIT/ML injection Inject 0.27 mLs (27 Units total) into the skin 2 (two) times daily. 10 mL 11   metFORMIN (GLUCOPHAGE-XR) 500 MG 24 hr tablet Take 1 tablet (500 mg total) by mouth 2 (two) times daily. 60 tablet 2   metoprolol succinate (TOPROL-XL) 25 MG 24 hr tablet Take 25 mg by mouth daily.     apixaban (ELIQUIS) 5 MG TABS tablet Take 1 tablet (5 mg total) by mouth 2 (two) times daily. 60 tablet 2   carvedilol (COREG) 6.25 MG tablet Take 1 tablet (6.25 mg total) by mouth 2 (two) times daily with a meal. 60 tablet 2   cetirizine (ZYRTEC) 10 MG tablet Take 0.5 tablets (5 mg total) by mouth daily. 15 tablet 0   diltiazem (CARDIZEM CD) 240 MG 24 hr capsule Take 1 capsule (240 mg total) by mouth every evening. (Patient taking differently: Take 180 mg by mouth every evening.) 30 capsule 2   sitaGLIPtin-metformin (JANUMET) 50-1000 MG tablet Take 1 tablet by mouth 2 (two) times daily with a meal. (Patient not taking: Reported on 03/04/2022)     No current facility-administered medications for this visit.     PHYSICAL EXAMINATION: ECOG PERFORMANCE STATUS: 2 - Symptomatic, <50% confined to bed Vitals:   03/04/22 1012  BP: (!) 146/93  Pulse: 93  Temp: 98.1 F (36.7 C)   Filed Weights   03/04/22 1012  Weight: 168 lb (76.2 kg)    Physical Exam Constitutional:      General: She is not in acute distress.    Comments: Frail elderly female, she ambulates with a walker.   HENT:     Head: Normocephalic and atraumatic.  Eyes:     General: No scleral icterus. Cardiovascular:     Rate and Rhythm: Tachycardia present. Rhythm irregular.     Heart sounds: Normal heart sounds.  Pulmonary:     Effort: Pulmonary effort is normal. No respiratory distress.     Breath sounds: No  wheezing.  Abdominal:     General: Bowel sounds are normal. There is no distension.     Palpations: Abdomen is soft.  Musculoskeletal:        General: No deformity. Normal range of motion.     Cervical back: Normal range of motion and neck supple.  Skin:    General: Skin is warm and dry.  Findings: No erythema or rash.  Neurological:     Mental Status: She is alert and oriented to person, place, and time.     Cranial Nerves: No cranial nerve deficit.     Coordination: Coordination normal.  Psychiatric:     Comments: Depressed     LABORATORY DATA:  I have reviewed the data as listed Lab Results  Component Value Date   WBC 5.9 03/04/2022   HGB 11.7 (L) 03/04/2022   HCT 38.2 03/04/2022   MCV 85.7 03/04/2022   PLT 214 03/04/2022   Recent Labs    01/07/22 0853 01/28/22 0913 03/04/22 1057  NA 138 137 134*  K 3.7 4.3 5.2*  CL 104 105 102  CO2 _0 GLUCOSE 181* 204* 242*  BUN _1 CREATININE 0.54 0.53 0.59  CALCIUM 10.4* 10.3 9.9  GFRNONAA >60 >60 >60  PROT 6.3* 6.6 6.4*  ALBUMIN 3.3* 3.4* 3.5  AST _2 ALT _3 ALKPHOS 101 115 110  BILITOT 0.4 0.5 0.3    Iron/TIBC/Ferritin/ %Sat No results found for: "IRON", "TIBC", "FERRITIN", "IRONPCTSAT"    RADIOGRAPHIC STUDIES: I have personally reviewed the radiological images as listed and agreed with the findings in the report. No results found.    ASSESSMENT & PLAN:  1. Renal cell carcinoma of right kidney ([place])   2. Encounter for antineoplastic immunotherapy   3. Noncompliance    #Right kidney mass with retroperitoneal lymphadenopathy, lung mass and peritoneal nodularity Progressed on 1st line treatment of Keytruda Last CT showed Kidney lesion 7.7x 6.1cm comparing to 7.2 x 6.1cm in Feb 2023, 5.4 x 4.8cm in Oct 2022.  Retroperitoneal LN 2.6cm previously 1.9cm I recommend 2nd line treatment of Axitinib,start with 87m BID Rational and potential side effects were reviewed with patient and her  POA, and they agree with the plan. Medical compliance has been an issue and hopefully now better with the help of her POA.  Check cbc cmp LDH today  #Atrial fibrillation heart rate control is better today.    All questions were answered. The patient knows to call the clinic with any problems questions or concerns.  Follow up  Patient will follow up 1-2 weeks after she starts on Axitinib.   ZEarlie Server MD, PhD Hematology Oncology  03/04/2022

## 2022-03-05 ENCOUNTER — Other Ambulatory Visit (HOSPITAL_COMMUNITY): Payer: Self-pay

## 2022-03-05 ENCOUNTER — Telehealth: Payer: Self-pay | Admitting: Pharmacy Technician

## 2022-03-05 ENCOUNTER — Telehealth: Payer: Self-pay

## 2022-03-05 ENCOUNTER — Telehealth: Payer: Self-pay | Admitting: Pharmacist

## 2022-03-05 ENCOUNTER — Encounter: Payer: Self-pay | Admitting: Oncology

## 2022-03-05 DIAGNOSIS — C641 Malignant neoplasm of right kidney, except renal pelvis: Secondary | ICD-10-CM

## 2022-03-05 MED ORDER — AXITINIB 5 MG PO TABS
5.0000 mg | ORAL_TABLET | Freq: Two times a day (BID) | ORAL | 0 refills | Status: DC
  Filled 2022-03-05 – 2022-03-08 (×2): qty 60, 30d supply, fill #0

## 2022-03-05 NOTE — Telephone Encounter (Signed)
-----   Message from [name], MD sent at 03/04/2022 11:23 PM EDT ----- I plan to start her on Axitinib '5mg'$  BID. She was previously on for a short period of time and off that due to non compliance. Hopefully now she will take her meds with the help her new POA. Would you please help to arrange that.  I plan to see her back 1-2 weeks after starting on Axitinib.

## 2022-03-05 NOTE — Telephone Encounter (Unsigned)
Oral Oncology Pharmacist Encounter  Received new prescription for Inlyta (axitinib) for the treatment of RCC, planned duration until disease progression or unacceptable drug toxicity. She was previously on axitinib for a short period of time and off that due to non compliance.   CMP from 03/04/22 assessed, no relevant lab abnormalities. Prescription dose and frequency assessed.   Current medication list in Epic reviewed, one DDIs with diltiazem identified: Diltiazem: plasma concentrations and pharmacologic effects of axitinib may be increased by diltiazem. Monitor for increased axitinib toxicities. No baseline dose adjustment needed. No baseline dose adjustment needed.   Evaluated chart and no patient barriers to medication adherence identified.   Prescription has been e-scribed to the [facility] for benefits analysis and approval.  Oral Oncology Clinic will continue to follow for insurance authorization, copayment issues, initial counseling and start date.  Patient agreed to treatment on 03/04/22 per MD documentation.  Darl Pikes, PharmD, BCPS, BCOP, CPP Hematology/Oncology Clinical Pharmacist Practitioner [place]/DB/AP Oral [facility] [phone]  03/05/2022 9:05 AM

## 2022-03-05 NOTE — Telephone Encounter (Signed)
Oral Oncology Patient Advocate Encounter  Was successful in securing patient a $10,000 grant from [employer] to provide copayment coverage for Inlyta.  This will keep the out of pocket expense at $0.     [employer] ID: [id number]   The billing information is as follows and has been shared with [place].    RxBin: [id number] PCN: [id number] Member ID: [id number] Group ID: [id number] Dates of Eligibility: 02/03/22 through 02/03/23  Fund:  Renal Cell [place] Patient [place] Phone [phone] Fax [phone] 03/05/2022 9:23 AM

## 2022-03-05 NOTE — Telephone Encounter (Signed)
Oral Oncology Patient Advocate Encounter  After completing a benefits investigation, prior authorization for Inlyta is not required at this time through [facility].   Patient's copay is $100.00.   Obtained a grant through [employer] to cover cost of copay.  [place] Patient [place] Phone [phone] Fax [phone] 03/05/2022 9:19 AM

## 2022-03-08 ENCOUNTER — Encounter: Payer: Self-pay | Admitting: Oncology

## 2022-03-08 ENCOUNTER — Other Ambulatory Visit (HOSPITAL_COMMUNITY): Payer: Self-pay

## 2022-03-08 NOTE — Telephone Encounter (Signed)
Per Mitchell Heir, pt will have medication on Wednesday and get started. She has been scheduled for follow up on 7/11.

## 2022-03-08 NOTE — Telephone Encounter (Signed)
Oral Chemotherapy Pharmacist Encounter  [facility] will deliver medication on 03/10/22 to [facility]. Her POA [name] knows she is to get started when she has medication in hand.  Patient Education I spoke with patient's POA [name] for overview of new oral chemotherapy medication:  Inlyta (axitinib) for the treatment of RCC, planned duration until disease progression or unacceptable drug toxicity. She was previously on axitinib for a short period of time and off that due to non compliance. [name] reports that they have hired someone to come in and help give [place] her medication.   Counseled [name] on administration, dosing, side effects, monitoring, drug-food interactions, safe handling, storage, and disposal. Patient will take 1 tablet (5 mg total) by mouth 2 (two) times daily.  Side effects include but not limited to: hypertension, diarrhea, hand-foot syndrome, decreased wbc/hgb.    Reviewed with [name] importance of keeping a medication schedule and plan for any missed doses.  After discussion with [name] no patient barriers to medication adherence identified.   [name] voiced understanding and appreciation. All questions answered. Medication handout provided.  Provided [facility] with Oral Chemotherapy Navigation Clinic phone number. [name] knows to call the office with questions or concerns. Oral Chemotherapy Navigation Clinic will continue to follow.  Darl Pikes, PharmD, BCPS, BCOP, CPP Hematology/Oncology Clinical Pharmacist Practitioner [place]/DB/AP Oral [place] [facility] [phone]  03/08/2022 12:17 PM

## 2022-03-15 ENCOUNTER — Other Ambulatory Visit (HOSPITAL_COMMUNITY): Payer: Self-pay

## 2022-03-16 ENCOUNTER — Inpatient Hospital Stay: Payer: Medicare PPO | Admitting: Pharmacist

## 2022-03-16 ENCOUNTER — Encounter: Payer: Self-pay | Admitting: Oncology

## 2022-03-16 ENCOUNTER — Inpatient Hospital Stay: Payer: Medicare PPO | Admitting: Oncology

## 2022-03-16 ENCOUNTER — Inpatient Hospital Stay: Payer: Medicare PPO | Attending: Oncology

## 2022-03-16 VITALS — BP 158/103 | HR 103 | Temp 96.7°F | Resp 18 | Wt 157.2 lb

## 2022-03-16 DIAGNOSIS — Z91148 Patient's other noncompliance with medication regimen for other reason: Secondary | ICD-10-CM | POA: Diagnosis not present

## 2022-03-16 DIAGNOSIS — Z79899 Other long term (current) drug therapy: Secondary | ICD-10-CM | POA: Insufficient documentation

## 2022-03-16 DIAGNOSIS — Z91199 Patient's noncompliance with other medical treatment and regimen due to unspecified reason: Secondary | ICD-10-CM

## 2022-03-16 DIAGNOSIS — I4891 Unspecified atrial fibrillation: Secondary | ICD-10-CM

## 2022-03-16 DIAGNOSIS — Z794 Long term (current) use of insulin: Secondary | ICD-10-CM | POA: Insufficient documentation

## 2022-03-16 DIAGNOSIS — E1169 Type 2 diabetes mellitus with other specified complication: Secondary | ICD-10-CM | POA: Diagnosis not present

## 2022-03-16 DIAGNOSIS — Z5112 Encounter for antineoplastic immunotherapy: Secondary | ICD-10-CM

## 2022-03-16 DIAGNOSIS — C641 Malignant neoplasm of right kidney, except renal pelvis: Secondary | ICD-10-CM | POA: Insufficient documentation

## 2022-03-16 DIAGNOSIS — Z5111 Encounter for antineoplastic chemotherapy: Secondary | ICD-10-CM

## 2022-03-16 DIAGNOSIS — K521 Toxic gastroenteritis and colitis: Secondary | ICD-10-CM | POA: Diagnosis not present

## 2022-03-16 DIAGNOSIS — Z7901 Long term (current) use of anticoagulants: Secondary | ICD-10-CM | POA: Insufficient documentation

## 2022-03-16 LAB — CBC WITH DIFFERENTIAL/PLATELET
Abs Immature Granulocytes: 0.03 10*3/uL (ref 0.00–0.07)
Basophils Absolute: 0 10*3/uL (ref 0.0–0.1)
Basophils Relative: 1 %
Eosinophils Absolute: 0.2 10*3/uL (ref 0.0–0.5)
Eosinophils Relative: 2 %
HCT: 44.4 % (ref 36.0–46.0)
Hemoglobin: 14.2 g/dL (ref 12.0–15.0)
Immature Granulocytes: 0 %
Lymphocytes Relative: 25 %
Lymphs Abs: 2 10*3/uL (ref 0.7–4.0)
MCH: 26.6 pg (ref 26.0–34.0)
MCHC: 32 g/dL (ref 30.0–36.0)
MCV: 83.1 fL (ref 80.0–100.0)
Monocytes Absolute: 0.7 10*3/uL (ref 0.1–1.0)
Monocytes Relative: 9 %
Neutro Abs: 4.8 10*3/uL (ref 1.7–7.7)
Neutrophils Relative %: 63 %
Platelets: 245 10*3/uL (ref 150–400)
RBC: 5.34 MIL/uL — ABNORMAL HIGH (ref 3.87–5.11)
RDW: 13.2 % (ref 11.5–15.5)
WBC: 7.8 10*3/uL (ref 4.0–10.5)
nRBC: 0 % (ref 0.0–0.2)

## 2022-03-16 LAB — COMPREHENSIVE METABOLIC PANEL
ALT: 14 U/L (ref 0–44)
AST: 20 U/L (ref 15–41)
Albumin: 3.6 g/dL (ref 3.5–5.0)
Alkaline Phosphatase: 149 U/L — ABNORMAL HIGH (ref 38–126)
Anion gap: 4 — ABNORMAL LOW (ref 5–15)
BUN: 21 mg/dL (ref 8–23)
CO2: 27 mmol/L (ref 22–32)
Calcium: 10 mg/dL (ref 8.9–10.3)
Chloride: 107 mmol/L (ref 98–111)
Creatinine, Ser: 0.62 mg/dL (ref 0.44–1.00)
GFR, Estimated: >60 mL/min (ref >60)
Glucose, Bld: 193 mg/dL — ABNORMAL HIGH (ref 70–99)
Potassium: 4.2 mmol/L (ref 3.5–5.1)
Sodium: 138 mmol/L (ref 135–145)
Total Bilirubin: 0.4 mg/dL (ref 0.3–1.2)
Total Protein: 7.2 g/dL (ref 6.5–8.1)

## 2022-03-16 NOTE — Progress Notes (Signed)
Pt her for follow up. Pt slow to respond today. Per caregiver she had low blood sugar reading last night. MAR updated by using facility [facility]

## 2022-03-16 NOTE — Progress Notes (Signed)
Hematology/Oncology Progress note [phone] [phone]      Patient Care Team: System, Provider Not In as PCP - General [name], MD as Consulting Physician (Hematology and Oncology)  CHIEF COMPLAINTS/REASON FOR VISIT:  Metastatic RCC  HISTORY OF PRESENTING ILLNESS:   Patient is a poor historian. Her mood appears to be depressed.  She currently lives at assisted living facility. Moved from [place]. Widowed 3 years ago.  Her power of attorney is [name] who is her husband's niece. Barbara Frank was called during this encounter.   Extensive medical record review was performed. She has had multiple images done during the past few months, 05/27/20 CT head wo contrast- no acute intracranial abnormality.  06/01/20 CT abdomen and pelvis with contrast 4 mm proximal left ureteral calculus causing mild proximal left- sided hydroureteronephrosis with periureteric and perinephric stranding on the left.5.2cm enhancing right mid anterior kidney mass. Mildly enlarged right retroperitoneal retrocaval lymph nodes suspicious for nodal metastases Mild cardiomegaly, Small hiatal hernia. Status post gastric bypass surgery and cholecystectomy. Postoperative changes anterior abdominal wall. 06/05/20 CT head wo contrast - No acute intracranial abnormality. Probable tiny right frontal scalp Contusion. 2. Mild global volume loss and minimal chronic ischemic small vessel disease. 06/06/20 CT abdomen pelvis wo contrast 1. Indeterminate lesion in the right kidney which is stable may be a solid mass., 2. Partially calcified uterine fibroids. 3. Left UVJ calculus measuring 5 mm 06/07/20 CT head wo contrast.  1. No acute intracranial abnormality. No significant change. 2. Intracranial atherosclerosis, atrophy and chronic small vessel ischemic changes. 3. Chronic/subacute right maxillary sinus disease.  06/07/20 Cystoscopy by [name] for Ureteroscopic stone extraction Admitted on 10/3/21due  to failure to thrive,  anorexia, dehydration, electrolyte imbalances.   06/09/20 CT abdomen and chest with contrast 1. Right renal mass is stable in size. Most suggestive of a renal neoplasm. 2. Right retroperitoneal lymph nodes are stable in size; may well be metastatic. 3. Left hydronephrosis and hydroureter is stable. Pelvis is not imaged therefore if there is a distal calculus still present, it is not visible. 4. Sclerotic lesions in the anterior left seventh, eighth and ninth ribs which given distribution are most likely healed rib fractures.  She is on Eliquis 67m Bid for atrial fibrillation. She reports not taking anti depressants that were given by her PCP.  #11/04/2020, PET scan was obtained. PET scan showed interval increasing size of right upper lobe pulmonary lesion, now 3 cm with an SUV of 3.8.  Associated with a new right lower lobe nodule 8 mm.  5.1 cm right renal mass hypermetabolic activity.  1.9 retrocaval lymph node with SUV of 4.8.  Second index retrocaval node with SUV of 2.  Nodular disease seen in the duodenum on previous study is not well demonstrated on noncontrast CT image today.  Some nodular FDG activity, nonspecific. Multiple anterior abdominal soft tissue nodules are evident.  13 mm anterior left nodule shows no substantial hypermetabolic with an SUV of 1.7.  Calcified left lateral nodule with an SUV of 2.9.  2.2 cm calcified nodule lobe right paracolic gutter with an SUV of 2.5.  12/17/2020, right kidney mass biopsy pathology was reviewed and discussed with patient.  Pathology is consistent with RCC-demonstrates the morphology spectrum, papillary, solid and nested areas.  CD10 positive, racemase positive, CK7 negative.  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  TFE 3 break apart FISH testing is pending.  #01/05/2021, addendum pathology TFE3 break-apart FISH testing was performed and  a translocation  involving Xp11 / TFE3 transcription gene was  not detected. HMB-45 IHC is negative  CAIX IHC demonstrates membranous staining.  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  MRI brain was obtained during the admission and was negative for acute process.  Patient mental status improved.  UTI was treated with IV antibiotics and switched to p.o. antibiotic upon discharge.  Patient also was found to have rapid ventricular response with her atrial fibrillation.  Rate was treated with Cardizem drip during hospitalization because and switched to Toprol and Cardizem for rate control.  Eliquis for anticoagulation. Patient's axitinib was held during hospitalization.  There was plan for patient to restart axitinib upon discharge.  However since patient was sent to rehab, he is not able to be started on axitinib due to lack of access to her medication supply.  [facility]  Patient presents for post hospitalization follow-up.  She does not have much recollection of her recent admission.  Reports feeling well today.  He denies any fever, chills, nausea vomiting diarrhea.  03/07/2021, CT abdomen pelvis  Right renal mass slightly enlarged since 10/03/2020.  5.4 x 4.9 x 6.4 comparing to previously 5.0 x 4.4 x 6.3.  Stable adjacent right retroperitoneal lymphadenopathy.  There is a new 1.5 cm nodule in the right lower lobe.  Mild stranding around the right renal mass.  04/01/2022 resumed Axitinib 13mg79mD 05/06/2022 she presented for follow up, she stopped " all her medications". Tachycardia, hyperglycemia, she declined ER evaluation.  06/03/2022, presented for follow up.  Multiple no show to her CT scan.  Palliative care service has not been able to reach her  06/23/2021, CT chest abdomen pelvis showed stable large right anterior renal mass.  Stable to slightly larger right-sided retroperitoneal adenopathy.  Waxing and waning pulmonary nodules.  2 lower lobe nodules resolved.  New right middle lobe and right upper lobe nodules.  06/29/2022  Hold chemotherapy due to hyperglycemia.  Blood sugar was 431, due to noncompliance.  Recommend patient to go to emergency room for evaluation and she declines. talked to patient's PCP TracOlivia Mackier the phone and discussed patient's case with her. Patient is not complaint with her medication. Her diabetes was poorly controlled and glucose was extremely high, she refused to go to ER for management. She is also extremely depressed. Most recent CT scan shows stable disease. We both agree that patient is not able to take care of herself.  TracOlivia Mackiel try to get her to see psychiatrist for evaluation of her depression,she may benefit from living in a facility as well. From Oncology aspect, her prognosis is poor, and I will hold off additional treatment until her other acute medical conditions are stabilized. Hospice/palliative care is reasonable if her condition continues to deteriorate. TracOlivia Mackiel update me if she improves. .   07/23/2021 - 07/31/2021, hospitalized due to A. fib with RVR. 09/09/2021 - 09/14/2021, the patient due to A. fib with RVR, uncontrolled diabetes with glucose of 500s, DKA. 09/09/2021, CT angio chest PE protocol showed interval decrease of the right upper lobe nodularity.  No PE.  10/09/2021 , re-establish care, she expressed interests of resuming RCC treatments. She agrees  with getting CT restaging. She reports being complaint with her medication.   10/28/21 CT showed progression of right renal mass, retroperitoneal lymphadenopathy. Stable small lung nodules. Cardiomegaly with biatrial dilatation. Aortic atherosclerosis. calcifications of the aortic valve,  Dilatation of the pulmonic trunk   01/20/2022, CT chest abdomen pelvis with contrast showed heterogeneous mass of the anterior midportion of the right kidney is slightly increased in size, as is an adjacent right retroperitoneal lymph node.  Consistent with disease progression.  Findings are consistent with worsened renal cell carcinoma and adjacent nodal metastatic disease. New pulmonary nodule of the superior segment right lower lobe measuring 0.7 cm, highly suspicious for a small metastasis. Multiple additional tiny pulmonary nodules are unchanged and likely benign and incidental. Attention on follow-up. Coronary artery disease.   INTERVAL HISTORY Barbara Frank is a 76 y.o. female who has above history reviewed by me today presents for follow up visit for management of metastatic renal clear-cell carcinoma 03/08/2022, started on axitinib 5 mg twice daily. Today she is accompanied by her friend [name]  Patient is a poor historian.  Patient reports loose bowel movements, 2 episodes per day.  Otherwise no new complaints. Patient has home care RN visits to help her with her medications and watch her to do insulin injections BP is increased, 158/103.  Heart rate 103.     Review of Systems  Constitutional:  Positive for fatigue. Negative for appetite change, chills and fever.  HENT:   Negative for hearing loss and voice change.   Eyes:  Negative for eye problems.  Respiratory:  Negative for chest tightness and cough.   Cardiovascular:  Negative for chest pain.  Gastrointestinal:  Positive for diarrhea. Negative for abdominal distention, abdominal pain, blood in stool and constipation.  Endocrine: Negative for hot flashes.  Genitourinary:  Negative for difficulty urinating and frequency.   Musculoskeletal:  Negative for arthralgias.  Skin:  Negative for itching and rash.  Neurological:  Negative for extremity weakness.  Hematological:  Negative for adenopathy.  Psychiatric/Behavioral:   Positive for depression. Negative for confusion.     MEDICAL HISTORY:  Past Medical History:  Diagnosis Date   A-fib ([place])    Depression    Diabetes mellitus without complication ([place])    History of kidney stones    Kidney mass     SURGICAL HISTORY: Past Surgical History:  Procedure Laterality Date   GASTRIC BYPASS     HERNIA REPAIR     TONSILLECTOMY     URETEROSCOPY WITH HOLMIUM LASER LITHOTRIPSY Left     SOCIAL HISTORY: Social History   Socioeconomic History   Marital status: Widowed    Spouse name: Not on file   Number of children: Not on file   Years of education: Not on file   Highest education level: Not on file  Occupational History   Not on file  Tobacco Use   Smoking status: Never   Smokeless tobacco: Never  Vaping Use   Vaping Use: Never used  Substance and Sexual Activity   Alcohol use: Not Currently   Drug use: Not Currently   Sexual activity: Not Currently  Other Topics Concern   Not on file  Social History Narrative   Not on file   Social Determinants of Health   Financial Resource Strain: Not on file  Food Insecurity: Not on file  Transportation Needs: Not on file  Physical Activity: Not on file  Stress: Not on file  Social Connections: Not on file  Intimate Partner Violence:  Not on file    FAMILY HISTORY: Family History  Problem Relation Age of Onset   Breast cancer Mother    Bladder Cancer Neg Hx    Kidney cancer Neg Hx    Prostate cancer Neg Hx     ALLERGIES:  is allergic to kiwi extract.  MEDICATIONS:  Current Outpatient Medications  Medication Sig Dispense Refill   apixaban (ELIQUIS) 5 MG TABS tablet Take 1 tablet (5 mg total) by mouth 2 (two) times daily. 60 tablet 2   cyanocobalamin 1000 MCG tablet Take 1,000 mcg by mouth daily.     escitalopram (LEXAPRO) 10 MG tablet Take 10 mg by mouth daily.     metFORMIN (GLUCOPHAGE-XR) 500 MG 24 hr tablet Take 1 tablet (500 mg total) by mouth 2 (two) times daily. (Patient taking  differently: Take 1,000 mg by mouth daily.) 60 tablet 2   metoprolol succinate (TOPROL-XL) 25 MG 24 hr tablet Take 25 mg by mouth daily.     axitinib (INLYTA) 5 MG tablet Take 1 tablet (5 mg total) by mouth 2 (two) times daily. (Patient not taking: Reported on 03/16/2022) 60 tablet 0   cetirizine (ZYRTEC) 10 MG tablet Take 0.5 tablets (5 mg total) by mouth daily. (Patient not taking: Reported on 03/16/2022) 15 tablet 0   cetirizine (ZYRTEC) 5 MG tablet Take 5 mg by mouth daily. (Patient not taking: Reported on 03/16/2022)     diltiazem (CARDIZEM CD) 240 MG 24 hr capsule Take 1 capsule (240 mg total) by mouth every evening. (Patient not taking: Reported on 03/16/2022) 30 capsule 2   diltiazem (TIAZAC) 180 MG 24 hr capsule Take 180 mg by mouth daily. (Patient not taking: Reported on 03/16/2022)     insulin aspart (NOVOLOG) 100 UNIT/ML injection Inject 0-15 Units into the skin 3 (three) times daily with meals. CBG 70 - 120: 0 units,  121 - 150: 2 units, 151 - 200: 3 units,  201 - 250: 5 units,  251 - 300: 8 units, 301 - 350: 11 units 351 - 400: 15 units, CBG > 400: call MD and obtain STAT lab verification (Patient not taking: Reported on 03/16/2022) 10 mL 11   insulin aspart (NOVOLOG) 100 UNIT/ML injection Inject 4 Units into the skin 3 (three) times daily with meals. (Patient not taking: Reported on 03/16/2022) 10 mL 0   insulin glargine (LANTUS) 100 UNIT/ML injection Inject 0.27 mLs (27 Units total) into the skin 2 (two) times daily. (Patient not taking: Reported on 03/16/2022) 10 mL 11   No current facility-administered medications for this visit.     PHYSICAL EXAMINATION: ECOG PERFORMANCE STATUS: 2 - Symptomatic, <50% confined to bed Vitals:   03/16/22 1338  BP: (!) 158/103  Pulse: (!) 103  Resp: 18  Temp: (!) 96.7 F (35.9 C)   Filed Weights   03/16/22 1338  Weight: 157 lb 3.2 oz (71.3 kg)    Physical Exam Constitutional:      General: She is not in acute distress.    Comments: Frail  elderly female, she ambulates with a walker.   HENT:     Head: Normocephalic and atraumatic.  Eyes:     General: No scleral icterus. Cardiovascular:     Rate and Rhythm: Tachycardia present. Rhythm irregular.     Heart sounds: Normal heart sounds.  Pulmonary:     Effort: Pulmonary effort is normal. No respiratory distress.     Breath sounds: No wheezing.  Abdominal:     General: Bowel sounds are  normal. There is no distension.     Palpations: Abdomen is soft.  Musculoskeletal:        General: No deformity. Normal range of motion.     Cervical back: Normal range of motion and neck supple.  Skin:    General: Skin is warm and dry.     Findings: No erythema or rash.  Neurological:     Mental Status: She is alert and oriented to person, place, and time.     Cranial Nerves: No cranial nerve deficit.     Coordination: Coordination normal.  Psychiatric:     Comments: Depressed     LABORATORY DATA:  I have reviewed the data as listed Lab Results  Component Value Date   WBC 7.8 03/16/2022   HGB 14.2 03/16/2022   HCT 44.4 03/16/2022   MCV 83.1 03/16/2022   PLT 245 03/16/2022   Recent Labs    01/28/22 0913 03/04/22 1057 03/16/22 1320  NA 137 134* 138  K 4.3 5.2* 4.2  CL 105 102 107  CO2 27 28 27   GLUCOSE 204* 242* 193*  BUN 17 17 21   CREATININE 0.53 0.59 0.62  CALCIUM 10.3 9.9 10.0  GFRNONAA >60 >60 >60  PROT 6.6 6.4* 7.2  ALBUMIN 3.4* 3.5 3.6  AST 18 21 20   ALT 11 18 14   ALKPHOS 115 110 149*  BILITOT 0.5 0.3 0.4    Iron/TIBC/Ferritin/ %Sat No results found for: "IRON", "TIBC", "FERRITIN", "IRONPCTSAT"    RADIOGRAPHIC STUDIES: I have personally reviewed the radiological images as listed and agreed with the findings in the report. No results found.    ASSESSMENT & PLAN:  1. Renal cell carcinoma of right kidney ([place])   2. Noncompliance   3. Atrial fibrillation with RVR ([place])   4. Type 2 diabetes mellitus with other specified complication, with long-term  current use of insulin ([place])   5. Encounter for antineoplastic chemotherapy    #Right kidney mass with retroperitoneal lymphadenopathy, lung mass and peritoneal nodularity Progressed on 1st line treatment of Keytruda Patient is currently on 2nd line treatment of Axitinib,start with 5mg  BID Overall she tolerates well continue current regimen . #Chemotherapy-induced diarrhea, mild symptoms. Recommend patient to return as Imodium as needed.  #Hypertension, likely secondary to axitinib.  Close monitor. #Medication noncompliance.  Hopefully improved with involvement of her. #Diabetes, continue current diabetic regimen.  Follow-up with primary care provider. All questions were answered. The patient knows to call the clinic with any problems questions or concerns.  Follow up  Patient will follow up 3 weeks    [name], MD, PhD Hematology Oncology  03/16/2022

## 2022-03-18 NOTE — Progress Notes (Signed)
[place]  Telephone:([phone] [phone]  Patient Care Team: System, Provider Not In as PCP - General [name], MD as Consulting Physician (Hematology and Oncology)   Name of the patient: Barbara Frank  [id number]  10/08/1945   Date of visit: 03/18/22  HPI: Patient is a 76 y.o. female with metastatic RCC. She was previously on axitinib for a short period of time and off that due to non compliance (see MD note for date). She restarted axitinib on 03/11/22.   Patient POA [name] reports that they have hired someone to come in and help give [name] her medication twice a day.  Reason for Consult: Oral chemotherapy follow-up for axitinib therapy.   PAST MEDICAL HISTORY: Past Medical History:  Diagnosis Date   A-fib [facility])    Depression    Diabetes mellitus without complication ([place])    History of kidney stones    Kidney mass     HEMATOLOGY/ONCOLOGY HISTORY:  Oncology History  Renal cell carcinoma ([place])  09/26/2020 Initial Diagnosis   Renal cell carcinoma ([place])   12/24/2020 Cancer Staging   Staging form: Kidney, AJCC 8th Edition - Clinical stage from 12/24/2020: Stage IV (cT1b, cN1, cM1) - Signed by [name], MD on 12/24/2020   01/08/2021 -  Chemotherapy   Patient is on Treatment Plan : RENAL CELL Pembrolizumab        ALLERGIES:  is allergic to kiwi extract.  MEDICATIONS:  Current Outpatient Medications  Medication Sig Dispense Refill   apixaban (ELIQUIS) 5 MG TABS tablet Take 1 tablet (5 mg total) by mouth 2 (two) times daily. 60 tablet 2   axitinib (INLYTA) 5 MG tablet Take 1 tablet (5 mg total) by mouth 2 (two) times daily. (Patient not taking: Reported on 03/16/2022) 60 tablet 0   cetirizine (ZYRTEC) 10 MG tablet Take 0.5 tablets (5 mg total) by mouth daily. (Patient not taking: Reported on 03/16/2022) 15 tablet 0   cetirizine (ZYRTEC) 5 MG tablet Take 5 mg by mouth daily. (Patient not taking: Reported on 03/16/2022)      cyanocobalamin 1000 MCG tablet Take 1,000 mcg by mouth daily.     diltiazem (CARDIZEM CD) 240 MG 24 hr capsule Take 1 capsule (240 mg total) by mouth every evening. (Patient not taking: Reported on 03/16/2022) 30 capsule 2   diltiazem (TIAZAC) 180 MG 24 hr capsule Take 180 mg by mouth daily. (Patient not taking: Reported on 03/16/2022)     escitalopram (LEXAPRO) 10 MG tablet Take 10 mg by mouth daily.     insulin aspart (NOVOLOG) 100 UNIT/ML injection Inject 0-15 Units into the skin 3 (three) times daily with meals. CBG 70 - 120: 0 units,  121 - 150: 2 units, 151 - 200: 3 units,  201 - 250: 5 units,  251 - 300: 8 units, 301 - 350: 11 units 351 - 400: 15 units, CBG > 400: call MD and obtain STAT lab verification (Patient not taking: Reported on 03/16/2022) 10 mL 11   insulin aspart (NOVOLOG) 100 UNIT/ML injection Inject 4 Units into the skin 3 (three) times daily with meals. (Patient not taking: Reported on 03/16/2022) 10 mL 0   insulin glargine (LANTUS) 100 UNIT/ML injection Inject 0.27 mLs (27 Units total) into the skin 2 (two) times daily. (Patient not taking: Reported on 03/16/2022) 10 mL 11   metFORMIN (GLUCOPHAGE-XR) 500 MG 24 hr tablet Take 1 tablet (500 mg total) by mouth 2 (two) times daily. (Patient taking differently: Take  1,000 mg by mouth daily.) 60 tablet 2   metoprolol succinate (TOPROL-XL) 25 MG 24 hr tablet Take 25 mg by mouth daily.     No current facility-administered medications for this visit.    VITAL SIGNS: There were no vitals taken for this visit. There were no vitals filed for this visit.  Estimated body mass index is 28.75 kg/m as calculated from the following:   Height as of 03/04/22: '5\' 2"'$  (1.575 m).   Weight as of an earlier encounter on 03/16/22: 71.3 kg (157 lb 3.2 oz).  LABS: CBC:    Component Value Date/Time   WBC 7.8 03/16/2022 1320   HGB 14.2 03/16/2022 1320   HCT 44.4 03/16/2022 1320   PLT 245 03/16/2022 1320   MCV 83.1 03/16/2022 1320   NEUTROABS 4.8  03/16/2022 1320   LYMPHSABS 2.0 03/16/2022 1320   MONOABS 0.7 03/16/2022 1320   EOSABS 0.2 03/16/2022 1320   BASOSABS 0.0 03/16/2022 1320   Comprehensive Metabolic Panel:    Component Value Date/Time   NA 138 03/16/2022 1320   K 4.2 03/16/2022 1320   CL 107 03/16/2022 1320   CO2 27 03/16/2022 1320   BUN 21 03/16/2022 1320   CREATININE 0.62 03/16/2022 1320   GLUCOSE 193 (H) 03/16/2022 1320   CALCIUM 10.0 03/16/2022 1320   CALCIUM 10.4 (H) 07/28/2021 0808   AST 20 03/16/2022 1320   ALT 14 03/16/2022 1320   ALKPHOS 149 (H) 03/16/2022 1320   BILITOT 0.4 03/16/2022 1320   PROT 7.2 03/16/2022 1320   ALBUMIN 3.6 03/16/2022 1320     Present during today's visit: patient and POA [name]  Assessment and Plan: Continue axitinib '5mg'$  twice daily Patient's office BP today and home BPs are slightly elevated since starting the axitinib. Will continue to monitor.    Oral Chemotherapy Side Effect/Intolerance:  Diarrhea: patient reports having two loose stools per day. Patient encouraged to stay hydrated. Will continue to monitor. No reported hand-foot syndrome, headache/dizziness, rash, or nausea. Fatigue is unchanged  Oral Chemotherapy Adherence: no missed doses reported since aid was hired to administer her medications No patient barriers to medication adherence identified.   New medications: No new reported, but [name] is suppose to email me an updated medication list because there has been some changes. He was not able to find the most recent list during today's visit  Medication Access Issues: No issues, patient fills at [facility]   Patient expressed understanding and was in agreement with this plan. She also understands that She can call clinic at any time with any questions, concerns, or complaints.   Follow-up plan: RTC in 3 weeks  Thank you for allowing me to participate in the care of this very pleasant patient.   Time Total: 20 mins  Visit consisted of  counseling and education on dealing with issues of symptom management in the setting of serious and potentially life-threatening illness.Greater than 50%  of this time was spent counseling and coordinating care related to the above assessment and plan.  Signed by: Darl Pikes, PharmD, BCPS, BCOP, CPP Hematology/Oncology Clinical Pharmacist Practitioner [place]/DB/AP [facility] [phone]

## 2022-03-22 ENCOUNTER — Emergency Department: Payer: Medicare PPO

## 2022-03-22 ENCOUNTER — Observation Stay: Payer: Medicare PPO

## 2022-03-22 ENCOUNTER — Inpatient Hospital Stay
Admission: EM | Admit: 2022-03-22 | Discharge: 2022-03-26 | DRG: 181 | Disposition: A | Discharge status: Skilled Nursing Facility | Payer: Medicare PPO | Source: Skilled Nursing Facility | Attending: Obstetrics and Gynecology | Admitting: Obstetrics and Gynecology

## 2022-03-22 ENCOUNTER — Other Ambulatory Visit: Payer: Self-pay

## 2022-03-22 ENCOUNTER — Encounter: Payer: Self-pay | Admitting: *Deleted

## 2022-03-22 DIAGNOSIS — C7801 Secondary malignant neoplasm of right lung: Principal | ICD-10-CM | POA: Diagnosis present

## 2022-03-22 DIAGNOSIS — I480 Paroxysmal atrial fibrillation: Secondary | ICD-10-CM | POA: Diagnosis present

## 2022-03-22 DIAGNOSIS — Z6828 Body mass index (BMI) 28.0-28.9, adult: Secondary | ICD-10-CM

## 2022-03-22 DIAGNOSIS — I4891 Unspecified atrial fibrillation: Secondary | ICD-10-CM | POA: Diagnosis present

## 2022-03-22 DIAGNOSIS — R410 Disorientation, unspecified: Secondary | ICD-10-CM | POA: Diagnosis present

## 2022-03-22 DIAGNOSIS — R531 Weakness: Secondary | ICD-10-CM

## 2022-03-22 DIAGNOSIS — Z7984 Long term (current) use of oral hypoglycemic drugs: Secondary | ICD-10-CM

## 2022-03-22 DIAGNOSIS — R64 Cachexia: Secondary | ICD-10-CM | POA: Diagnosis present

## 2022-03-22 DIAGNOSIS — Z794 Long term (current) use of insulin: Secondary | ICD-10-CM

## 2022-03-22 DIAGNOSIS — F32A Depression, unspecified: Secondary | ICD-10-CM | POA: Diagnosis present

## 2022-03-22 DIAGNOSIS — Z79899 Other long term (current) drug therapy: Secondary | ICD-10-CM

## 2022-03-22 DIAGNOSIS — C641 Malignant neoplasm of right kidney, except renal pelvis: Secondary | ICD-10-CM | POA: Diagnosis present

## 2022-03-22 DIAGNOSIS — Z803 Family history of malignant neoplasm of breast: Secondary | ICD-10-CM

## 2022-03-22 DIAGNOSIS — F320 Major depressive disorder, single episode, mild: Secondary | ICD-10-CM | POA: Diagnosis not present

## 2022-03-22 DIAGNOSIS — E119 Type 2 diabetes mellitus without complications: Secondary | ICD-10-CM

## 2022-03-22 DIAGNOSIS — I482 Chronic atrial fibrillation, unspecified: Secondary | ICD-10-CM | POA: Diagnosis present

## 2022-03-22 DIAGNOSIS — C649 Malignant neoplasm of unspecified kidney, except renal pelvis: Secondary | ICD-10-CM | POA: Diagnosis present

## 2022-03-22 DIAGNOSIS — Z20822 Contact with and (suspected) exposure to covid-19: Secondary | ICD-10-CM | POA: Diagnosis present

## 2022-03-22 DIAGNOSIS — I4811 Longstanding persistent atrial fibrillation: Secondary | ICD-10-CM | POA: Diagnosis not present

## 2022-03-22 DIAGNOSIS — C7901 Secondary malignant neoplasm of right kidney and renal pelvis: Secondary | ICD-10-CM | POA: Diagnosis present

## 2022-03-22 DIAGNOSIS — Z7901 Long term (current) use of anticoagulants: Secondary | ICD-10-CM

## 2022-03-22 DIAGNOSIS — L304 Erythema intertrigo: Secondary | ICD-10-CM | POA: Diagnosis present

## 2022-03-22 DIAGNOSIS — Z66 Do not resuscitate: Secondary | ICD-10-CM | POA: Diagnosis present

## 2022-03-22 DIAGNOSIS — Z91148 Patient's other noncompliance with medication regimen for other reason: Secondary | ICD-10-CM

## 2022-03-22 DIAGNOSIS — R54 Age-related physical debility: Secondary | ICD-10-CM | POA: Diagnosis present

## 2022-03-22 DIAGNOSIS — I10 Essential (primary) hypertension: Secondary | ICD-10-CM | POA: Diagnosis present

## 2022-03-22 DIAGNOSIS — R4182 Altered mental status, unspecified: Secondary | ICD-10-CM | POA: Diagnosis not present

## 2022-03-22 DIAGNOSIS — G9340 Encephalopathy, unspecified: Secondary | ICD-10-CM | POA: Diagnosis present

## 2022-03-22 LAB — CBC
HCT: 43.9 % (ref 36.0–46.0)
Hemoglobin: 13.7 g/dL (ref 12.0–15.0)
MCH: 25.5 pg — ABNORMAL LOW (ref 26.0–34.0)
MCHC: 31.2 g/dL (ref 30.0–36.0)
MCV: 81.8 fL (ref 80.0–100.0)
Platelets: 224 10*3/uL (ref 150–400)
RBC: 5.37 MIL/uL — ABNORMAL HIGH (ref 3.87–5.11)
RDW: 13.2 % (ref 11.5–15.5)
WBC: 9.7 10*3/uL (ref 4.0–10.5)
nRBC: 0 % (ref 0.0–0.2)

## 2022-03-22 LAB — BASIC METABOLIC PANEL
Anion gap: 7 (ref 5–15)
BUN: 23 mg/dL (ref 8–23)
CO2: 26 mmol/L (ref 22–32)
Calcium: 10.3 mg/dL (ref 8.9–10.3)
Chloride: 102 mmol/L (ref 98–111)
Creatinine, Ser: 0.59 mg/dL (ref 0.44–1.00)
GFR, Estimated: >60 mL/min (ref >60)
Glucose, Bld: 302 mg/dL — ABNORMAL HIGH (ref 70–99)
Potassium: 3.7 mmol/L (ref 3.5–5.1)
Sodium: 135 mmol/L (ref 135–145)

## 2022-03-22 LAB — HEPATIC FUNCTION PANEL
ALT: 12 U/L (ref 0–44)
AST: 17 U/L (ref 15–41)
Albumin: 3.2 g/dL — ABNORMAL LOW (ref 3.5–5.0)
Alkaline Phosphatase: 131 U/L — ABNORMAL HIGH (ref 38–126)
Bilirubin, Direct: 0.1 mg/dL (ref 0.0–0.2)
Indirect Bilirubin: 0.6 mg/dL (ref 0.3–0.9)
Total Bilirubin: 0.7 mg/dL (ref 0.3–1.2)
Total Protein: 6.8 g/dL (ref 6.5–8.1)

## 2022-03-22 LAB — TROPONIN I (HIGH SENSITIVITY): Troponin I (High Sensitivity): 11 ng/L (ref <18)

## 2022-03-22 LAB — BRAIN NATRIURETIC PEPTIDE: B Natriuretic Peptide: 182.6 pg/mL — ABNORMAL HIGH (ref 0.0–100.0)

## 2022-03-22 LAB — ETHANOL: Alcohol, Ethyl (B): <10 mg/dL (ref <10)

## 2022-03-22 LAB — SARS CORONAVIRUS 2 BY RT PCR: SARS Coronavirus 2 by RT PCR: NEGATIVE

## 2022-03-22 MED ORDER — GADOBUTROL 1 MMOL/ML IV SOLN
7.0000 mL | Freq: Once | INTRAVENOUS | Status: AC | PRN
Start: 2022-03-22 — End: 2022-03-22
  Administered 2022-03-22: 7 mL via INTRAVENOUS

## 2022-03-22 MED ORDER — IOHEXOL 350 MG/ML SOLN
75.0000 mL | Freq: Once | INTRAVENOUS | Status: AC | PRN
  Administered 2022-03-22: 75 mL via INTRAVENOUS

## 2022-03-22 MED ORDER — AXITINIB 5 MG PO TABS: 5.0000 mg | ORAL_TABLET | Freq: Two times a day (BID) | ORAL | Status: DC

## 2022-03-22 MED ORDER — LORAZEPAM 2 MG/ML IJ SOLN
0.5000 mg | INTRAMUSCULAR | Status: DC | PRN
Start: 2022-03-22 — End: 2022-03-26

## 2022-03-22 MED ORDER — VITAMIN B-12 1000 MCG PO TABS
1000.0000 ug | ORAL_TABLET | Freq: Every day | ORAL | Status: DC
  Administered 2022-03-23 – 2022-03-26 (×4): 1000 ug via ORAL
  Filled 2022-03-22 (×4): qty 1

## 2022-03-22 MED ORDER — POLYETHYLENE GLYCOL 3350 17 G PO PACK: 17.0000 g | PACK | Freq: Two times a day (BID) | ORAL | Status: DC | PRN

## 2022-03-22 MED ORDER — ESCITALOPRAM OXALATE 10 MG PO TABS
10.0000 mg | ORAL_TABLET | Freq: Every day | ORAL | Status: DC
  Administered 2022-03-23 – 2022-03-26 (×4): 10 mg via ORAL
  Filled 2022-03-22 (×4): qty 1

## 2022-03-22 MED ORDER — ACETAMINOPHEN 650 MG RE SUPP: 650.0000 mg | Freq: Four times a day (QID) | RECTAL | Status: DC | PRN

## 2022-03-22 MED ORDER — METOPROLOL SUCCINATE ER 25 MG PO TB24
25.0000 mg | ORAL_TABLET | Freq: Every day | ORAL | Status: DC
  Administered 2022-03-23 – 2022-03-26 (×4): 25 mg via ORAL
  Filled 2022-03-22 (×4): qty 1

## 2022-03-22 MED ORDER — METOPROLOL TARTRATE 5 MG/5ML IV SOLN: 5.0000 mg | INTRAVENOUS | Status: DC | PRN

## 2022-03-22 MED ORDER — MELATONIN 5 MG PO TABS: 5.0000 mg | ORAL_TABLET | Freq: Every evening | ORAL | Status: DC | PRN

## 2022-03-22 MED ORDER — ENOXAPARIN SODIUM 40 MG/0.4ML IJ SOSY: 40.0000 mg | PREFILLED_SYRINGE | Freq: Every day | INTRAMUSCULAR | Status: DC

## 2022-03-22 MED ORDER — ACETAMINOPHEN 325 MG PO TABS: 650.0000 mg | ORAL_TABLET | Freq: Four times a day (QID) | ORAL | Status: DC | PRN

## 2022-03-22 MED ORDER — DILTIAZEM HCL 25 MG/5ML IV SOLN
10.0000 mg | Freq: Once | INTRAVENOUS | Status: AC
Start: 2022-03-22 — End: 2022-03-22
  Administered 2022-03-22: 10 mg via INTRAVENOUS
  Filled 2022-03-22: qty 5

## 2022-03-22 MED ORDER — ONDANSETRON HCL 4 MG PO TABS: 4.0000 mg | ORAL_TABLET | Freq: Four times a day (QID) | ORAL | Status: DC | PRN

## 2022-03-22 MED ORDER — INSULIN GLARGINE-YFGN 100 UNIT/ML ~~LOC~~ SOLN
27.0000 [IU] | Freq: Two times a day (BID) | SUBCUTANEOUS | Status: DC
  Administered 2022-03-23 – 2022-03-24 (×4): 27 [IU] via SUBCUTANEOUS
  Filled 2022-03-22 (×5): qty 0.27

## 2022-03-22 MED ORDER — DILTIAZEM HCL ER COATED BEADS 240 MG PO CP24
240.0000 mg | ORAL_CAPSULE | Freq: Every evening | ORAL | Status: DC
  Administered 2022-03-23 – 2022-03-24 (×2): 240 mg via ORAL
  Filled 2022-03-22 (×3): qty 1

## 2022-03-22 MED ORDER — ONDANSETRON HCL 4 MG/2ML IJ SOLN: 4.0000 mg | Freq: Four times a day (QID) | INTRAMUSCULAR | Status: DC | PRN

## 2022-03-22 MED ORDER — INSULIN ASPART 100 UNIT/ML IJ SOLN
0.0000 [IU] | Freq: Three times a day (TID) | INTRAMUSCULAR | Status: DC
  Administered 2022-03-23 – 2022-03-24 (×6): 3 [IU] via SUBCUTANEOUS
  Administered 2022-03-25: 5 [IU] via SUBCUTANEOUS
  Administered 2022-03-25 – 2022-03-26 (×2): 3 [IU] via SUBCUTANEOUS
  Filled 2022-03-22 (×9): qty 1

## 2022-03-22 MED ORDER — INSULIN ASPART 100 UNIT/ML IJ SOLN
0.0000 [IU] | Freq: Every day | INTRAMUSCULAR | Status: DC
  Administered 2022-03-23 (×2): 2 [IU] via SUBCUTANEOUS
  Filled 2022-03-22 (×2): qty 1

## 2022-03-22 MED ORDER — SENNOSIDES-DOCUSATE SODIUM 8.6-50 MG PO TABS: 1.0000 | ORAL_TABLET | Freq: Every evening | ORAL | Status: DC | PRN

## 2022-03-22 MED ORDER — APIXABAN 5 MG PO TABS
5.0000 mg | ORAL_TABLET | Freq: Two times a day (BID) | ORAL | Status: DC
  Administered 2022-03-23 – 2022-03-26 (×7): 5 mg via ORAL
  Filled 2022-03-22 (×8): qty 1

## 2022-03-22 NOTE — H&P (Signed)
History and Physical   Barbara Frank AJG:[id number] DOB: 04-18-1946 DOA: 03/22/2022  PCP: None Outpatient Specialists: Dr. Tasia Catchings, oncology Patient coming from: [facility] via EMS  I have personally briefly reviewed patient's old medical records in [place].  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  Vitals in the emergency department showed temperature of 98.1, respiration rate of 8, heart rate of 92, blood pressure 136/79, SPO2 of 94% on room air.  Serum sodium 135, potassium 3.7, chloride 102, bicarb 26, BUN of 23, serum creatinine of 0.59, nonfasting blood glucose 302, GFR greater than 60.  BNP was elevated at 182.6.  WBC 9.7, hemoglobin 13.7, platelets of 224.  Patient was initially in A-fib with RVR in the ED.  ED treatment: Diltiazem 10 mg IV one-time dose  At bedside patient was able to tell me her full name, her age, and she was able to tell me the current calendar year with some difficulty.  She was not able to tell me the current location.  She reports not being any pain.  Healthcare power of attorney, Mr. [name] was at bedside and denied any reported fever, diarrhea, vomiting at [facility].  He reports that he saw her on 03/21/2022 evening and she stated to him that she has not been eating very well.  He did notice that there were no forks or spoons left out and it is usually the case when she has eaten.  He reports that she is noncompliant with medications so he does not know when the last time she has taken her medications appropriately.  He states that he did notice that she has been more confused since starting her  antineoplastic medications about 2 weeks ago.  Social history: She lives at [facility]. POA denies tobacco, etoh   ROS: Unable to complete as patient is altered  ED Course: Discussed with emergency medicine provider, patient requiring hospitalization for chief concerns of A-fib with RVR and altered mental status.  Assessment/Plan  Principal Problem:   Altered mental status Active Problems:   Atrial fibrillation with RVR (HCC)   Depression   Renal cell carcinoma (HCC)   Encephalopathy   Type 2 diabetes mellitus without complication, with long-term current use of insulin (HCC)   Weakness   Generalized weakness   A-fib (HCC)   Assessment and Plan:  * Altered mental status - Etiology work-up in progress with A-fib with RVR and noncompliance with Eliquis - MRI of the brain with contrast has been ordered to assess for metastasis in setting of renal cell carcinoma on the right kidney - Discussed with nursing staff to ensure PureWick is in place to assess for urine analysis and urine culture, UDS - Check procalcitonin, vitamin B12, ammonia level - Check CTA of the chest to assess for pulmonary embolism - AM team to consult medical oncology for recommendations regarding continuation versus alternative for antineoplastic medications if the above work-up is negative - Admit to MedSurg, observation  A-fib ([place]) - Resumed home Eliquis 5 mg p.o. twice daily with noncompliance  Encephalopathy - Work-up in progress  Depression - Resumed home Lexapro 10 mg daily  Atrial fibrillation with RVR ([place]) - Etiology work-up in progress - Status post diltiazem 10  mg IV one-time dose per EDP - Metoprolol tartrate 5 mg IV every 2 hours as needed for heart rate greater than 125, 3 doses ordered - If metoprolol tartrate does not control future episodes of A-fib with RVR, cross coverage in a.m. team can consider ordering as needed diltiazem dosing - Admit to medical telemetry  CODE STATUS-POA  states that patient has a living will but states she does not want to be resuscitated  Chart reviewed.   DVT prophylaxis: Apixaban 5 mg p.o. twice daily Code Status: DNR/DNI confirmed with [name] Diet: Heart healthy/carb modified, nectar thick Family Communication: Updated POA, [name] at bedside, and he can be reached at [phone] Disposition Plan: Pending clinical course Consults called: None at this time Admission status: Telemetry medical, observation  Past Medical History:  Diagnosis Date   A-fib ([place])    Depression    Diabetes mellitus without complication ([place])    History of kidney stones    Kidney mass    Past Surgical History:  Procedure Laterality Date   GASTRIC BYPASS     HERNIA REPAIR     TONSILLECTOMY     URETEROSCOPY WITH HOLMIUM LASER LITHOTRIPSY Left    Social History:  reports that she has never smoked. She has never used smokeless tobacco. She reports that she does not currently use alcohol. She reports that she does not currently use drugs.  Allergies  Allergen Reactions   Kiwi Extract    Family History  Problem Relation Age of Onset   Breast cancer Mother    Bladder Cancer Neg Hx    Kidney cancer Neg Hx    Prostate cancer Neg Hx    Family history: Family history reviewed and not pertinent  Prior to Admission medications   Medication Sig Start Date End Date Taking? Authorizing Provider  apixaban (ELIQUIS) 5 MG TABS tablet Take 1 tablet (5 mg total) by mouth 2 (two) times daily. 09/11/21 03/22/22 Yes Val Riles, MD  axitinib (INLYTA) 5 MG tablet Take 1 tablet (5 mg total) by mouth 2 (two) times daily. 03/05/22  Yes [name], MD  cetirizine (ZYRTEC) 10 MG tablet Take 0.5 tablets (5 mg total) by mouth daily. 09/11/21 03/22/22 Yes Val Riles, MD  cyanocobalamin 1000 MCG tablet Take 1,000 mcg by mouth daily.   Yes [provider]  diltiazem (CARDIZEM CD) 240 MG 24 hr capsule Take 1 capsule (240 mg total) by mouth every evening. 09/11/21  03/22/22 Yes Val Riles, MD  escitalopram (LEXAPRO) 10 MG tablet Take 10 mg by mouth daily. 03/09/22  Yes [provider]  insulin glargine (LANTUS) 100 UNIT/ML injection Inject 0.27 mLs (27 Units total) into the skin 2 (two) times daily. 09/11/21  Yes Val Riles, MD  metFORMIN (GLUCOPHAGE-XR) 500 MG 24 hr tablet Take 1 tablet (500 mg total) by mouth 2 (two) times daily. Patient taking differently: Take 1,000 mg by mouth daily. 09/11/21 03/22/22 Yes Val Riles, MD  metoprolol succinate (TOPROL-XL) 25 MG 24 hr tablet Take 25 mg by mouth daily. 10/05/21  Yes [provider]  insulin aspart (NOVOLOG) 100 UNIT/ML injection Inject 0-15 Units into the skin 3 (three) times daily with meals. CBG 70 - 120: 0 units,  121 - 150: 2 units, 151 - 200: 3 units,  201 - 250: 5 units,  251 - 300: 8 units, 301 - 350: 11 units 351 - 400: 15 units, CBG > 400: call MD and obtain STAT lab verification Patient not taking: Reported on 03/16/2022 09/11/21  Val Riles, MD  insulin aspart (NOVOLOG) 100 UNIT/ML injection Inject 4 Units into the skin 3 (three) times daily with meals. Patient not taking: Reported on 03/16/2022 09/11/21   Val Riles, MD   Physical Exam: Vitals:   03/22/22 2100 03/22/22 2130 03/22/22 2200 03/22/22 2230  BP: 136/79 (!) 146/77 138/74 131/76  Pulse: 92 96    Resp: (!) 8 15 (!) 27 (!) 28  Temp:      TempSrc:      SpO2: 94% 96%    Weight:      Height:       Constitutional: appears frail, cachectic, NAD, calm, comfortable Eyes: PERRL, lids and conjunctivae normal ENMT: Mucous membranes are moist. Posterior pharynx clear of any exudate or lesions. Age-appropriate dentition. Hearing appropriate Neck: normal, supple, no masses, no thyromegaly Respiratory: clear to auscultation bilaterally, no wheezing, no crackles. Normal respiratory effort. No accessory muscle use.  Cardiovascular: Regular rate and rhythm, no murmurs / rubs / gallops. No extremity edema. 2+ pedal pulses. No  carotid bruits.  Abdomen: Scaphoid abdomen, no tenderness, no masses palpated, no hepatosplenomegaly. Bowel sounds positive.  Musculoskeletal: no clubbing / cyanosis. No joint deformity upper and lower extremities. Good ROM, no contractures, no atrophy. Normal muscle tone.  Skin: no rashes, lesions, ulcers. No induration.  Pale skinned.  Bilateral lower extremity varicose veins Neurologic: Sensation intact. Strength 5/5 in all 4.  Psychiatric: Normal judgment and insight. Alert and oriented x 3. Normal mood.   EKG: independently reviewed, showing atrial fibrillation with rate of 119, QTc 449  Chest x-ray on Admission: I personally reviewed and I agree with radiologist reading as below.  CT HEAD WO CONTRAST (5MM)  Result Date: 03/22/2022 CLINICAL DATA:  Abdominal pain, acute, nonlocalized; Head trauma, minor (Age >= 65y); Neck trauma (Age >= 65y) EXAM: CT HEAD WITHOUT CONTRAST CT CERVICAL SPINE WITHOUT CONTRAST CT ABDOMEN AND PELVIS WITHOUT CONTRAST TECHNIQUE: Contiguous axial images were obtained from the base of the skull through the vertex without intravenous contrast. Multidetector CT imaging of the cervical spine was performed without intravenous contrast. Multiplanar CT image reconstructions were also generated. Multidetector CT imaging of the abdomen and pelvis was performed following the standard protocol without IV contrast. RADIATION DOSE REDUCTION: This exam was performed according to the departmental dose-optimization program which includes automated exposure control, adjustment of the mA and/or kV according to patient size and/or use of iterative reconstruction technique. COMPARISON:  CT chest abdomen pelvis 01/19/2022, CT head/C-spine 09/09/2021 FINDINGS: CT HEAD FINDINGS Brain: Normal anatomic configuration. Parenchymal volume loss is commensurate with the patient's age. Mild periventricular white matter changes are present likely reflecting the sequela of small vessel ischemia. No  abnormal intra or extra-axial mass lesion or fluid collection. No abnormal mass effect or midline shift. No evidence of acute intracranial hemorrhage or infarct. Ventricular size is normal. Cerebellum unremarkable. Vascular: No asymmetric hyperdense vasculature at the skull base. Skull: Intact Sinuses/Orbits: Mild mucosal thickening within the visualized right maxillary sinus. Remaining paranasal sinuses are clear. Orbits are unremarkable. Other: Mastoid air cells and middle ear cavities are clear. CT CERVICAL SPINE FINDINGS Alignment: Normal. Skull base and vertebrae: Craniocervical alignment is normal. The atlantodental interval is not widened. No acute fracture of the cervical spine. Vertebral body height is preserved. Soft tissues and spinal canal: No prevertebral fluid or swelling. No visible canal hematoma. No cervical adenopathy. Mild atherosclerotic calcification within the carotid bifurcations. 13 mm nodule within the left thyroid gland, not well characterized on this examination. Not clinically significant;  no follow-up imaging recommended (ref: J Am Coll Radiol. 2015 Feb;12(2): 143-50). Disc levels: There is intervertebral disc space narrowing, disc calcification, and endplate remodeling throughout the cervical spine in keeping with changes of diffuse advanced degenerative disc disease. The prevertebral soft tissues are not thickened. The spinal canal is widely patent. Multilevel uncovertebral and facet arthrosis results in multilevel mild neuroforaminal narrowing, most severe on the right at C3-4 and bilaterally at C5-6. Upper chest: Unremarkable Other: None CT ABDOMEN AND PELVIS FINDINGS Lower chest: Lobulated, spiculated nodule within the a right lower lobe has significantly enlarged in the interval since prior examination, now measuring 17 mm x 22 mm at axial image # 7/4. There is an internal air bronchograms noted, best appreciated on image # 4/4. Given its rate of growth as well as lack of  significant mass effect upon the adjacent airway favors an inflammatory or lymphoproliferative etiology, as can be seen with sarcoidosis, or pulmonary lymphoma, however, a metastatic focus is not completely excluded. Interval development of a elongate 9 x 5 mm nodule within the left lower lobe, axial image # 12/4. No pleural effusion. Cardiac size is within normal limits. Hepatobiliary: No focal liver abnormality is seen. Status post cholecystectomy. No biliary dilatation. Pancreas: Unremarkable Spleen: Unremarkable Adrenals/Urinary Tract: The adrenal glands are unremarkable. 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 Left kidney is unremarkable on this noncontrast examination. No hydronephrosis. The bladder is unremarkable. 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 Calcified nodules adjacent to the appendix are unchanged from prior examination, possibly representing the sequela of remote trauma or inflammation. The stomach, small bowel, and large bowel are otherwise unremarkable. No evidence of obstruction or focal inflammation. No free intraperitoneal gas or fluid. Vascular/Lymphatic: Right perirenal adenopathy is again identified and appears stable to improved since prior examination. The index lymph node measures 20 x 22 mm at axial image # 26/2 previously measuring 26 x 23 mm. Remaining lymph nodes appears stable. No new pathologic adenopathy within the abdomen and pelvis. 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 The pelvic organs are otherwise unremarkable. Other: Diastasis of the rectus abdominus musculature is again noted without frank hernia formation. Rectum unremarkable Musculoskeletal: No acute bone abnormality. No lytic or blastic bone lesion. IMPRESSION: 1. No acute  intracranial abnormality. No calvarial fracture. 2. No acute fracture or listhesis of the cervical spine. 3. No acute intra-abdominal pathology identified. 4. Stable 7.8 cm right renal mass in keeping with the patient's known primary renal cell carcinoma. Stable to slightly improved right perirenal adenopathy. 5. Interval increase in size of a right lower lobe pulmonary nodule and development of a smaller left lower lobe pulmonary nodule. While these findings may be infectious or inflammatory in nature, the possibility of metastatic disease is not excluded. Nonemergent dedicated CT imaging of the chest or PET CT examination may be helpful for further evaluation. Electronically Signed   By: Fidela Salisbury M.D.   On: 03/22/2022 21:49   CT Cervical Spine Wo Contrast  Result Date: 03/22/2022 CLINICAL DATA:  Abdominal pain, acute, nonlocalized; Head trauma, minor (Age >= 65y); Neck trauma (Age >= 65y) EXAM: CT HEAD WITHOUT CONTRAST CT CERVICAL SPINE WITHOUT CONTRAST CT ABDOMEN AND PELVIS WITHOUT CONTRAST TECHNIQUE: Contiguous axial images were obtained from the base of the skull through the vertex without intravenous contrast.  Multidetector CT imaging of the cervical spine was performed without intravenous contrast. Multiplanar CT image reconstructions were also generated. Multidetector CT imaging of the abdomen and pelvis was performed following the standard protocol without IV contrast. RADIATION DOSE REDUCTION: This exam was performed according to the departmental dose-optimization program which includes automated exposure control, adjustment of the mA and/or kV according to patient size and/or use of iterative reconstruction technique. COMPARISON:  CT chest abdomen pelvis 01/19/2022, CT head/C-spine 09/09/2021 FINDINGS: CT HEAD FINDINGS Brain: Normal anatomic configuration. Parenchymal volume loss is commensurate with the patient's age. Mild periventricular white matter changes are present likely reflecting the  sequela of small vessel ischemia. No abnormal intra or extra-axial mass lesion or fluid collection. No abnormal mass effect or midline shift. No evidence of acute intracranial hemorrhage or infarct. Ventricular size is normal. Cerebellum unremarkable. Vascular: No asymmetric hyperdense vasculature at the skull base. Skull: Intact Sinuses/Orbits: Mild mucosal thickening within the visualized right maxillary sinus. Remaining paranasal sinuses are clear. Orbits are unremarkable. Other: Mastoid air cells and middle ear cavities are clear. CT CERVICAL SPINE FINDINGS Alignment: Normal. Skull base and vertebrae: Craniocervical alignment is normal. The atlantodental interval is not widened. No acute fracture of the cervical spine. Vertebral body height is preserved. Soft tissues and spinal canal: No prevertebral fluid or swelling. No visible canal hematoma. No cervical adenopathy. Mild atherosclerotic calcification within the carotid bifurcations. 13 mm nodule within the left thyroid gland, not well characterized on this examination. Not clinically significant; no follow-up imaging recommended (ref: J Am Coll Radiol. 2015 Feb;12(2): 143-50). Disc levels: There is intervertebral disc space narrowing, disc calcification, and endplate remodeling throughout the cervical spine in keeping with changes of diffuse advanced degenerative disc disease. The prevertebral soft tissues are not thickened. The spinal canal is widely patent. Multilevel uncovertebral and facet arthrosis results in multilevel mild neuroforaminal narrowing, most severe on the right at C3-4 and bilaterally at C5-6. Upper chest: Unremarkable Other: None CT ABDOMEN AND PELVIS FINDINGS Lower chest: Lobulated, spiculated nodule within the a right lower lobe has significantly enlarged in the interval since prior examination, now measuring 17 mm x 22 mm at axial image # 7/4. There is an internal air bronchograms noted, best appreciated on image # 4/4. Given its rate  of growth as well as lack of significant mass effect upon the adjacent airway favors an inflammatory or lymphoproliferative etiology, as can be seen with sarcoidosis, or pulmonary lymphoma, however, a metastatic focus is not completely excluded. Interval development of a elongate 9 x 5 mm nodule within the left lower lobe, axial image # 12/4. No pleural effusion. Cardiac size is within normal limits. Hepatobiliary: No focal liver abnormality is seen. Status post cholecystectomy. No biliary dilatation. Pancreas: Unremarkable Spleen: Unremarkable Adrenals/Urinary Tract: The adrenal glands are unremarkable. 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 Left kidney is unremarkable on this noncontrast examination. No hydronephrosis. The bladder is unremarkable. 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 Calcified nodules adjacent to the appendix are unchanged from prior examination, possibly representing the sequela of remote trauma or inflammation. The stomach, small bowel, and large bowel are otherwise unremarkable. No evidence of obstruction or focal inflammation. No free intraperitoneal gas or fluid. Vascular/Lymphatic: Right perirenal adenopathy is again identified and appears stable to improved since prior examination. The index lymph node  measures 20 x 22 mm at axial image # 26/2 previously measuring 26 x 23 mm. Remaining lymph nodes appears stable. No new pathologic adenopathy within the abdomen and pelvis. 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 The pelvic organs are otherwise unremarkable. Other: Diastasis of the rectus abdominus musculature is again noted without frank hernia formation. Rectum unremarkable Musculoskeletal: No acute bone abnormality. No lytic or blastic bone  lesion. IMPRESSION: 1. No acute intracranial abnormality. No calvarial fracture. 2. No acute fracture or listhesis of the cervical spine. 3. No acute intra-abdominal pathology identified. 4. Stable 7.8 cm right renal mass in keeping with the patient's known primary renal cell carcinoma. Stable to slightly improved right perirenal adenopathy. 5. Interval increase in size of a right lower lobe pulmonary nodule and development of a smaller left lower lobe pulmonary nodule. While these findings may be infectious or inflammatory in nature, the possibility of metastatic disease is not excluded. Nonemergent dedicated CT imaging of the chest or PET CT examination may be helpful for further evaluation. Electronically Signed   By: Fidela Salisbury M.D.   On: 03/22/2022 21:49   CT ABDOMEN PELVIS WO CONTRAST  Result Date: 03/22/2022 CLINICAL DATA:  Abdominal pain, acute, nonlocalized; Head trauma, minor (Age >= 65y); Neck trauma (Age >= 65y) EXAM: CT HEAD WITHOUT CONTRAST CT CERVICAL SPINE WITHOUT CONTRAST CT ABDOMEN AND PELVIS WITHOUT CONTRAST TECHNIQUE: Contiguous axial images were obtained from the base of the skull through the vertex without intravenous contrast. Multidetector CT imaging of the cervical spine was performed without intravenous contrast. Multiplanar CT image reconstructions were also generated. Multidetector CT imaging of the abdomen and pelvis was performed following the standard protocol without IV contrast. RADIATION DOSE REDUCTION: This exam was performed according to the departmental dose-optimization program which includes automated exposure control, adjustment of the mA and/or kV according to patient size and/or use of iterative reconstruction technique. COMPARISON:  CT chest abdomen pelvis 01/19/2022, CT head/C-spine 09/09/2021 FINDINGS: CT HEAD FINDINGS Brain: Normal anatomic configuration. Parenchymal volume loss is commensurate with the patient's age. Mild periventricular white matter changes are  present likely reflecting the sequela of small vessel ischemia. No abnormal intra or extra-axial mass lesion or fluid collection. No abnormal mass effect or midline shift. No evidence of acute intracranial hemorrhage or infarct. Ventricular size is normal. Cerebellum unremarkable. Vascular: No asymmetric hyperdense vasculature at the skull base. Skull: Intact Sinuses/Orbits: Mild mucosal thickening within the visualized right maxillary sinus. Remaining paranasal sinuses are clear. Orbits are unremarkable. Other: Mastoid air cells and middle ear cavities are clear. CT CERVICAL SPINE FINDINGS Alignment: Normal. Skull base and vertebrae: Craniocervical alignment is normal. The atlantodental interval is not widened. No acute fracture of the cervical spine. Vertebral body height is preserved. Soft tissues and spinal canal: No prevertebral fluid or swelling. No visible canal hematoma. No cervical adenopathy. Mild atherosclerotic calcification within the carotid bifurcations. 13 mm nodule within the left thyroid gland, not well characterized on this examination. Not clinically significant; no follow-up imaging recommended (ref: J Am Coll Radiol. 2015 Feb;12(2): 143-50). Disc levels: There is intervertebral disc space narrowing, disc calcification, and endplate remodeling throughout the cervical spine in keeping with changes of diffuse advanced degenerative disc disease. The prevertebral soft tissues are not thickened. The spinal canal is widely patent. Multilevel uncovertebral and facet arthrosis results in multilevel mild neuroforaminal narrowing, most severe on the right at C3-4 and bilaterally at C5-6. Upper chest: Unremarkable Other: None CT ABDOMEN AND PELVIS FINDINGS Lower chest: Lobulated, spiculated  nodule within the a right lower lobe has significantly enlarged in the interval since prior examination, now measuring 17 mm x 22 mm at axial image # 7/4. There is an internal air bronchograms noted, best appreciated on  image # 4/4. Given its rate of growth as well as lack of significant mass effect upon the adjacent airway favors an inflammatory or lymphoproliferative etiology, as can be seen with sarcoidosis, or pulmonary lymphoma, however, a metastatic focus is not completely excluded. Interval development of a elongate 9 x 5 mm nodule within the left lower lobe, axial image # 12/4. No pleural effusion. Cardiac size is within normal limits. Hepatobiliary: No focal liver abnormality is seen. Status post cholecystectomy. No biliary dilatation. Pancreas: Unremarkable Spleen: Unremarkable Adrenals/Urinary Tract: The adrenal glands are unremarkable. 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 Left kidney is unremarkable on this noncontrast examination. No hydronephrosis. The bladder is unremarkable. 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 Calcified nodules adjacent to the appendix are unchanged from prior examination, possibly representing the sequela of remote trauma or inflammation. The stomach, small bowel, and large bowel are otherwise unremarkable. No evidence of obstruction or focal inflammation. No free intraperitoneal gas or fluid. Vascular/Lymphatic: Right perirenal adenopathy is again identified and appears stable to improved since prior examination. The index lymph node measures 20 x 22 mm at axial image # 26/2 previously measuring 26 x 23 mm. Remaining lymph nodes appears stable. No new pathologic adenopathy within the abdomen and pelvis. 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 The pelvic organs are otherwise unremarkable. Other: Diastasis of the rectus abdominus musculature is again noted without frank hernia formation. Rectum unremarkable Musculoskeletal: No acute bone abnormality.  No lytic or blastic bone lesion. IMPRESSION: 1. No acute intracranial abnormality. No calvarial fracture. 2. No acute fracture or listhesis of the cervical spine. 3. No acute intra-abdominal pathology identified. 4. Stable 7.8 cm right renal mass in keeping with the patient's known primary renal cell carcinoma. Stable to slightly improved right perirenal adenopathy. 5. Interval increase in size of a right lower lobe pulmonary nodule and development of a smaller left lower lobe pulmonary nodule. While these findings may be infectious or inflammatory in nature, the possibility of metastatic disease is not excluded. Nonemergent dedicated CT imaging of the chest or PET CT examination may be helpful for further evaluation. Electronically Signed   By: Fidela Salisbury M.D.   On: 03/22/2022 21:49   DG Chest Port 1 View  Result Date: 03/22/2022 CLINICAL DATA:  Altered mental status EXAM: PORTABLE CHEST 1 VIEW COMPARISON:  09/09/2021, CT 01/19/2022 FINDINGS: Cardiomegaly with aortic atherosclerosis. No acute airspace disease, effusion or pneumothorax. Postsurgical changes in the epigastric area IMPRESSION: No active disease.  Cardiomegaly Electronically Signed   By: Donavan Foil M.D.   On: 03/22/2022 20:50    Labs on Admission: I have personally reviewed following labs  CBC: Recent Labs  Lab 03/16/22 1320 03/22/22 2005  WBC 7.8 9.7  NEUTROABS 4.8  --   HGB 14.2 13.7  HCT 44.4 43.9  MCV 83.1 81.8  PLT 245 335   Basic Metabolic Panel: Recent Labs  Lab 03/16/22 1320 03/22/22 2005  NA 138 135  K 4.2 3.7  CL 107 102  CO2 27 26  GLUCOSE 193* 302*  BUN 21 23  CREATININE 0.62 0.59  CALCIUM 10.0 10.3   GFR: Estimated Creatinine Clearance: 56.1 mL/min (by C-G formula based on SCr of 0.59 mg/dL).  Liver Function Tests: Recent Labs  Lab 03/16/22 1320 03/22/22 2020  AST 20 17  ALT 14 12  ALKPHOS 149* 131*  BILITOT 0.4 0.7  PROT 7.2 6.8  ALBUMIN 3.6 3.2*   Urine analysis:    Component  Value Date/Time   COLORURINE YELLOW (A) 09/09/2021 1137   APPEARANCEUR HAZY (A) 09/09/2021 1137   LABSPEC 1.031 (H) 09/09/2021 1137   PHURINE 5.0 09/09/2021 1137   GLUCOSEU >=500 (A) 09/09/2021 1137   HGBUR SMALL (A) 09/09/2021 1137   BILIRUBINUR NEGATIVE 09/09/2021 1137   KETONESUR 80 (A) 09/09/2021 1137   PROTEINUR NEGATIVE 09/09/2021 1137   NITRITE NEGATIVE 09/09/2021 1137   LEUKOCYTESUR LARGE (A) 09/09/2021 1137   Dr. Tobie Poet [facility]  If 7PM-7AM, please contact overnight-coverage provider If 7AM-7PM, please contact day coverage provider [email or web address]  03/22/2022, 11:16 PM

## 2022-03-22 NOTE — Assessment & Plan Note (Addendum)
-   Etiology work-up in progress - Status post diltiazem 10 mg IV one-time dose per EDP - Metoprolol tartrate 5 mg IV every 2 hours as needed for heart rate greater than 125, 3 doses ordered - If metoprolol tartrate does not control future episodes of A-fib with RVR, cross coverage in a.m. team can consider ordering as needed diltiazem dosing - Admit to medical telemetry

## 2022-03-22 NOTE — Assessment & Plan Note (Addendum)
-   Etiology work-up in progress with A-fib with RVR and noncompliance with Eliquis - MRI of the brain with contrast has been ordered to assess for metastasis in setting of renal cell carcinoma on the right kidney - Discussed with nursing staff to ensure PureWick is in place to assess for urine analysis and urine culture, UDS - Check procalcitonin, vitamin B12, ammonia level - Check CTA of the chest to assess for pulmonary embolism - AM team to consult medical oncology for recommendations regarding continuation versus alternative for antineoplastic medications if the above work-up is negative - Admit to [place], observation

## 2022-03-22 NOTE — ED Triage Notes (Signed)
Pt brought in via ems from unknown .  Pt is confused.  Pt unable to answer questions.  Pt alert.

## 2022-03-22 NOTE — Assessment & Plan Note (Addendum)
-   Resumed home Eliquis 5 mg p.o. twice daily with noncompliance

## 2022-03-22 NOTE — Assessment & Plan Note (Signed)
-   Resumed home Lexapro 10 mg daily

## 2022-03-22 NOTE — Assessment & Plan Note (Signed)
Work-up in progress 

## 2022-03-22 NOTE — ED Provider Notes (Signed)
[facility] Provider Note    Event Date/Time   First MD Initiated Contact with Patient 03/22/22 2015     (approximate)   History   Altered Mental Status   HPI  Barbara Frank is a 76 y.o. female with unknown history who comes in with altered mental status.  EMS dropped her off so unclear where she came from.  No report was given to nurses and patient not able to provide much history.  I reviewed patient's office visit with oncology on 03/16/2022 where patient has renal cell carcinoma, A-fib on Eliquis, diabetes who is a poor historian at baseline per their note.  Patient is alert and oriented x2.  She is able to move all extremities well.  She is not sure how she got here.  She states that she has been taking her medicine.   Physical Exam   Triage Vital Signs: ED Triage Vitals  Enc Vitals Group     BP 03/22/22 2002 (!) 139/98     Pulse Rate 03/22/22 2002 (!) 119     Resp 03/22/22 2002 18     Temp 03/22/22 2002 98.1 F (36.7 C)     Temp Source 03/22/22 2002 Oral     SpO2 03/22/22 2002 95 %     Weight 03/22/22 2003 156 lb 8.4 oz (71 kg)     Height 03/22/22 2003 '5\' 2"'$  (1.575 m)     Head Circumference --      Peak Flow --      Pain Score 03/22/22 2003 0     Pain Loc --      Pain Edu? --      Excl. in [place]? --     Most recent vital signs: Vitals:   03/22/22 2002  BP: (!) 139/98  Pulse: (!) 119  Resp: 18  Temp: 98.1 F (36.7 C)  SpO2: 95%     General: Awake, no distress.  CV:  Good peripheral perfusion.  Resp:  Normal effort.  Abd:  No distention.  Other:  Soft and nontender.  Equal strength in arms and legs.  Sensation tact throughout.  Cranial nerves appear intact.   ED Results / Procedures / Treatments   Labs (all labs ordered are listed, but only abnormal results are displayed) Labs Reviewed  BASIC METABOLIC PANEL  CBC  TROPONIN I (HIGH SENSITIVITY)     EKG  My interpretation of EKG:  Atrial fibrillation rate of 119  without any ST elevation, T wave inversion in lead III, right bundle branch block and left anterior fascicular block  RADIOLOGY I have reviewed the CT head personally and interpreted no evidence of intracranial hemorrhage   PROCEDURES:  Critical Care performed: Yes, see critical care procedure note(s)  .1-3 Lead EKG Interpretation  Performed by: Vanessa [place], MD Authorized by: Vanessa [place], MD     Interpretation: abnormal     ECG rate:  120   ECG rate assessment: tachycardic     Rhythm: atrial fibrillation     Ectopy: none     Conduction: normal   .Critical Care  Performed by: Vanessa [place], MD Authorized by: Vanessa [place], MD   Critical care provider statement:    Critical care time (minutes):  30   Critical care was necessary to treat or prevent imminent or life-threatening deterioration of the following conditions:  Cardiac failure   Critical care was time spent personally by me on the following activities:  Development of treatment plan  with patient or surrogate, discussions with consultants, evaluation of patient's response to treatment, examination of patient, ordering and review of laboratory studies, ordering and review of radiographic studies, ordering and performing treatments and interventions, pulse oximetry, re-evaluation of patient's condition and review of old charts    MEDICATIONS ORDERED IN ED: Medications  diltiazem (CARDIZEM) injection 10 mg (has no administration in time range)     IMPRESSION / MDM / [place] / ED COURSE  I reviewed the triage vital signs and the nursing notes.   Patient's presentation is most consistent with acute presentation with potential threat to life or bodily function.   Unknown last known normal.  Patient is not the best historian although she is moving all extremities well and do not see any obvious evidence of stroke therefore will not call stroke code.  Labs ordered evaluate for Electra abnormalities, AKI, EtOH  use, drugs, UTI.  Will get CT head to evaluate for any intracranial hemorrhage and CT abdomen to evaluate for any evidence of obstruction.  Patient noted to be in A-fib with RVR therefore patient was given 10 mg of IV dill  BNP slightly elevated.  BMP shows elevated glucose but anion gap normal.  White count normal.  Troponin is negative.  EtOH negative.  HR of come down with the diltiazem  CT imaging overall reassuring does show enlarging nodule which most likely related to her renal cell cancer.  The nurse was able to find out that she is from [facility] that she was recently started on new medications as possible this is all medication induced but will discuss the hospital team for admission given altered mental status.  The patient is on the cardiac monitor to evaluate for evidence of arrhythmia and/or significant heart rate changes.      FINAL CLINICAL IMPRESSION(S) / ED DIAGNOSES   Final diagnoses:  Altered mental status, unspecified altered mental status type  Atrial fibrillation with rapid ventricular response ([place])     Rx / DC Orders   ED Discharge Orders     None        Note:  This document was prepared using Dragon voice recognition software and may include unintentional dictation errors.   Vanessa [place], MD 03/22/22 [phone]

## 2022-03-22 NOTE — Hospital Course (Signed)
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  Vitals in the emergency department showed temperature of 98.1, respiration rate of 8, heart rate of 92, blood pressure 136/79, SPO2 of 94% on room air.  Serum sodium 135, potassium 3.7, chloride 102, bicarb 26, BUN of 23, serum creatinine of 0.59, nonfasting blood glucose 302, GFR greater than 60.  BNP was elevated at 182.6.  WBC 9.7, hemoglobin 13.7, platelets of 224.  Patient was initially in A-fib with RVR in the ED.  ED treatment: Diltiazem 10 mg IV one-time dose

## 2022-03-23 DIAGNOSIS — R4182 Altered mental status, unspecified: Secondary | ICD-10-CM | POA: Diagnosis not present

## 2022-03-23 LAB — URINALYSIS, ROUTINE W REFLEX MICROSCOPIC
Bilirubin Urine: NEGATIVE
Glucose, UA: 150 mg/dL — AB
Hgb urine dipstick: NEGATIVE
Ketones, ur: NEGATIVE mg/dL
Nitrite: NEGATIVE
Protein, ur: NEGATIVE mg/dL
Specific Gravity, Urine: >1.046 — ABNORMAL HIGH (ref 1.005–1.030)
pH: 5 (ref 5.0–8.0)

## 2022-03-23 LAB — CBC
HCT: 42.8 % (ref 36.0–46.0)
Hemoglobin: 13.6 g/dL (ref 12.0–15.0)
MCH: 26.2 pg (ref 26.0–34.0)
MCHC: 31.8 g/dL (ref 30.0–36.0)
MCV: 82.5 fL (ref 80.0–100.0)
Platelets: 202 10*3/uL (ref 150–400)
RBC: 5.19 MIL/uL — ABNORMAL HIGH (ref 3.87–5.11)
RDW: 13.1 % (ref 11.5–15.5)
WBC: 7.9 10*3/uL (ref 4.0–10.5)
nRBC: 0 % (ref 0.0–0.2)

## 2022-03-23 LAB — BASIC METABOLIC PANEL
Anion gap: 5 (ref 5–15)
BUN: 16 mg/dL (ref 8–23)
CO2: 29 mmol/L (ref 22–32)
Calcium: 9.9 mg/dL (ref 8.9–10.3)
Chloride: 105 mmol/L (ref 98–111)
Creatinine, Ser: 0.48 mg/dL (ref 0.44–1.00)
GFR, Estimated: >60 mL/min (ref >60)
Glucose, Bld: 170 mg/dL — ABNORMAL HIGH (ref 70–99)
Potassium: 3.3 mmol/L — ABNORMAL LOW (ref 3.5–5.1)
Sodium: 139 mmol/L (ref 135–145)

## 2022-03-23 LAB — CBG MONITORING, ED: Glucose-Capillary: 235 mg/dL — ABNORMAL HIGH (ref 70–99)

## 2022-03-23 LAB — URINE DRUG SCREEN, QUALITATIVE (ARMC ONLY)
Amphetamines, Ur Screen: NOT DETECTED
Barbiturates, Ur Screen: NOT DETECTED
Benzodiazepine, Ur Scrn: NOT DETECTED
Cannabinoid 50 Ng, Ur ~~LOC~~: NOT DETECTED
Cocaine Metabolite,Ur ~~LOC~~: NOT DETECTED
MDMA (Ecstasy)Ur Screen: NOT DETECTED
Methadone Scn, Ur: NOT DETECTED
Opiate, Ur Screen: NOT DETECTED
Phencyclidine (PCP) Ur S: NOT DETECTED
Tricyclic, Ur Screen: NOT DETECTED

## 2022-03-23 LAB — GLUCOSE, CAPILLARY
Glucose-Capillary: 165 mg/dL — ABNORMAL HIGH (ref 70–99)
Glucose-Capillary: 199 mg/dL — ABNORMAL HIGH (ref 70–99)
Glucose-Capillary: 200 mg/dL — ABNORMAL HIGH (ref 70–99)
Glucose-Capillary: 242 mg/dL — ABNORMAL HIGH (ref 70–99)

## 2022-03-23 LAB — TROPONIN I (HIGH SENSITIVITY): Troponin I (High Sensitivity): 9 ng/L (ref <18)

## 2022-03-23 LAB — VITAMIN B12: Vitamin B-12: 109 pg/mL — ABNORMAL LOW (ref 180–914)

## 2022-03-23 LAB — HEMOGLOBIN A1C
Hgb A1c MFr Bld: 9 % — ABNORMAL HIGH (ref 4.8–5.6)
Mean Plasma Glucose: 211.6 mg/dL

## 2022-03-23 LAB — AMMONIA: Ammonia: 25 umol/L (ref 9–35)

## 2022-03-23 LAB — PROCALCITONIN: Procalcitonin: <0.1 ng/mL

## 2022-03-23 MED ORDER — NYSTATIN 100000 UNIT/GM EX POWD
Freq: Three times a day (TID) | CUTANEOUS | Status: DC
  Filled 2022-03-23: qty 15

## 2022-03-23 MED ORDER — POTASSIUM CHLORIDE 20 MEQ PO PACK
40.0000 meq | PACK | Freq: Once | ORAL | Status: AC
Start: 2022-03-23 — End: 2022-03-23
  Administered 2022-03-23: 40 meq via ORAL
  Filled 2022-03-23: qty 2

## 2022-03-23 NOTE — Progress Notes (Addendum)
[place] [facility] [place] NAME: Barbara Frank    MR#:  [id number]  DATE OF BIRTH:  [birth year]  SUBJECTIVE:  patient is a poor historian. No family at bedside. Spoke with POA [name] on the phone. Per POA patient was recently started on oral chemo second line drug early part of July. Patient has started getting issues with memory and confusion. She appears disoriented. She has been missing her home medications. During my evaluation patient was not able to follow much instruction she did answer a couple questions appropriately. She is disoriented to time and place.   VITALS:  Blood pressure 129/81, pulse (!) 108, temperature 98.3 F (36.8 C), resp. rate 20, height '5\' 2"'$  (1.575 m), weight 71 kg, SpO2 100 %.  PHYSICAL EXAMINATION:  limited GENERAL:  76 y.o.-year-old patient lying in the bed with no acute distress.  LUNGS: Normal breath sounds bilaterally, no wheezing, rales, rhonchi.  CARDIOVASCULAR: S1, S2 normal. No murmurs, rubs, or gallops.  ABDOMEN: Soft, nontender, nondistended. Bowel sounds present.  EXTREMITIES: No  edema b/l.    NEUROLOGIC: nonfocal  patient is alert but confused and disoriented  SKIN: No obvious rash, lesion, or ulcer.   LABORATORY PANEL:  CBC Recent Labs  Lab 03/23/22 0416  WBC 7.9  HGB 13.6  HCT 42.8  PLT 202    Chemistries  Recent Labs  Lab 03/22/22 2020 03/23/22 0416  NA  --  139  K  --  3.3*  CL  --  105  CO2  --  29  GLUCOSE  --  170*  BUN  --  16  CREATININE  --  0.48  CALCIUM  --  9.9  AST 17  --   ALT 12  --   ALKPHOS 131*  --   BILITOT 0.7  --    Cardiac Enzymes No results for input(s): "TROPONINI" in the last 168 hours. RADIOLOGY:  MR BRAIN W WO CONTRAST  Result Date: 03/23/2022 CLINICAL DATA:  Renal cell carcinoma. Assessment for metastatic disease. EXAM: MRI HEAD WITHOUT AND WITH CONTRAST TECHNIQUE: Multiplanar, multiecho pulse sequences of the brain and surrounding structures were  obtained without and with intravenous contrast. CONTRAST:  63m GADAVIST GADOBUTROL 1 MMOL/ML IV SOLN COMPARISON:  03/06/2021 brain MRI FINDINGS: Brain: No acute infarct, mass effect or extra-axial collection. No acute or chronic hemorrhage. There is multifocal hyperintense T2-weighted signal within the white matter. Parenchymal volume and CSF spaces are normal. Within that limitation, there are no visible contrast-enhancing lesions. The midline structures are normal. Postcontrast images are severely motion degraded. Vascular: Major flow voids are preserved. Skull and upper cervical spine: Normal calvarium and skull base. Visualized upper cervical spine and soft tissues are normal. Sinuses/Orbits:No paranasal sinus fluid levels or advanced mucosal thickening. No mastoid or middle ear effusion. Normal orbits. IMPRESSION: 1. Severely motion degraded study without visible metastatic disease. 2. No acute intracranial abnormality. 3. Multifocal hyperintense T2-weighted signal within the white matter, most consistent with chronic microvascular ischemia. Electronically Signed   By: KUlyses JarredM.D.   On: 03/23/2022 00:18   CT Angio Chest Pulmonary Embolism (PE) W or WO Contrast  Result Date: 03/22/2022 CLINICAL DATA:  Altered mental status EXAM: CT ANGIOGRAPHY CHEST WITH CONTRAST TECHNIQUE: Multidetector CT imaging of the chest was performed using the standard protocol during bolus administration of intravenous contrast. Multiplanar CT image reconstructions and MIPs were obtained to evaluate the vascular anatomy. RADIATION DOSE REDUCTION: This exam was performed according to the  departmental dose-optimization program which includes automated exposure control, adjustment of the mA and/or kV according to patient size and/or use of iterative reconstruction technique. CONTRAST:  19m OMNIPAQUE IOHEXOL 350 MG/ML SOLN COMPARISON:  CT 01/20/2019. 06/23/2021 CT FINDINGS: Cardiovascular: Satisfactory opacification of the  pulmonary arteries to the segmental level. No evidence of pulmonary embolism. Mild cardiomegaly. No significant pericardial effusion. Mild aortic atherosclerosis without aneurysm. Enlarged pulmonary trunk up to 4.2 cm. Mediastinum/Nodes: Midline trachea. No thyroid mass. No suspicious lymph nodes. Esophagus within normal limits. Lungs/Pleura: No pleural effusion or pneumothorax. Increased right lower lobe irregular pulmonary nodule, previously 7 mm, now measuring 2 cm. Irregular pulmonary nodules in the right upper lobe measuring up to 9 mm, series 5, image 40. Upper Abdomen: Incompletely visualized large heterogeneous mass in the right kidney measures approximately 7.7 by 5.8 cm, previously 7.7 by 6.1 cm. Retroperitoneal lymph node measuring about 2.2 by 1.9 cm but incompletely visualized. Postsurgical changes of the stomach. Musculoskeletal: No acute osseous abnormality Review of the MIP images confirms the above findings. IMPRESSION: 1. Negative for acute pulmonary embolus. 2. Increased irregular right lower lobe pulmonary nodule, concerning for metastatic disease versus lung cancer. There is also increased right upper lobe pulmonary nodules compared to prior 3. Incompletely visualized large heterogeneous right renal mass corresponding to history of renal cell carcinoma, grossly stable in size. Adjacent right retroperitoneal node. 4. Mild cardiomegaly. Enlarged pulmonary trunk suggesting arterial hypertension Aortic Atherosclerosis (ICD10-I70.0). Electronically Signed   By: KDonavan FoilM.D.   On: 03/22/2022 23:27   CT HEAD WO CONTRAST (5MM)  Result Date: 03/22/2022 CLINICAL DATA:  Abdominal pain, acute, nonlocalized; Head trauma, minor (Age >= 65y); Neck trauma (Age >= 65y) EXAM: CT HEAD WITHOUT CONTRAST CT CERVICAL SPINE WITHOUT CONTRAST CT ABDOMEN AND PELVIS WITHOUT CONTRAST TECHNIQUE: Contiguous axial images were obtained from the base of the skull through the vertex without intravenous contrast.  Multidetector CT imaging of the cervical spine was performed without intravenous contrast. Multiplanar CT image reconstructions were also generated. Multidetector CT imaging of the abdomen and pelvis was performed following the standard protocol without IV contrast. RADIATION DOSE REDUCTION: This exam was performed according to the departmental dose-optimization program which includes automated exposure control, adjustment of the mA and/or kV according to patient size and/or use of iterative reconstruction technique. COMPARISON:  CT chest abdomen pelvis 01/19/2022, CT head/C-spine 09/09/2021 FINDINGS: CT HEAD FINDINGS Brain: Normal anatomic configuration. Parenchymal volume loss is commensurate with the patient's age. Mild periventricular white matter changes are present likely reflecting the sequela of small vessel ischemia. No abnormal intra or extra-axial mass lesion or fluid collection. No abnormal mass effect or midline shift. No evidence of acute intracranial hemorrhage or infarct. Ventricular size is normal. Cerebellum unremarkable. Vascular: No asymmetric hyperdense vasculature at the skull base. Skull: Intact Sinuses/Orbits: Mild mucosal thickening within the visualized right maxillary sinus. Remaining paranasal sinuses are clear. Orbits are unremarkable. Other: Mastoid air cells and middle ear cavities are clear. CT CERVICAL SPINE FINDINGS Alignment: Normal. Skull base and vertebrae: Craniocervical alignment is normal. The atlantodental interval is not widened. No acute fracture of the cervical spine. Vertebral body height is preserved. Soft tissues and spinal canal: No prevertebral fluid or swelling. No visible canal hematoma. No cervical adenopathy. Mild atherosclerotic calcification within the carotid bifurcations. 13 mm nodule within the left thyroid gland, not well characterized on this examination. Not clinically significant; no follow-up imaging recommended (ref: J Am Coll Radiol. 2015 Feb;12(2):  143-50). Disc levels: There is intervertebral disc space  narrowing, disc calcification, and endplate remodeling throughout the cervical spine in keeping with changes of diffuse advanced degenerative disc disease. The prevertebral soft tissues are not thickened. The spinal canal is widely patent. Multilevel uncovertebral and facet arthrosis results in multilevel mild neuroforaminal narrowing, most severe on the right at C3-4 and bilaterally at C5-6. Upper chest: Unremarkable Other: None CT ABDOMEN AND PELVIS FINDINGS Lower chest: Lobulated, spiculated nodule within the a right lower lobe has significantly enlarged in the interval since prior examination, now measuring 17 mm x 22 mm at axial image # 7/4. There is an internal air bronchograms noted, best appreciated on image # 4/4. Given its rate of growth as well as lack of significant mass effect upon the adjacent airway favors an inflammatory or lymphoproliferative etiology, as can be seen with sarcoidosis, or pulmonary lymphoma, however, a metastatic focus is not completely excluded. Interval development of a elongate 9 x 5 mm nodule within the left lower lobe, axial image # 12/4. No pleural effusion. Cardiac size is within normal limits. Hepatobiliary: No focal liver abnormality is seen. Status post cholecystectomy. No biliary dilatation. Pancreas: Unremarkable Spleen: Unremarkable Adrenals/Urinary Tract: The adrenal glands are unremarkable. 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 Left kidney is unremarkable on this noncontrast examination. No hydronephrosis. The bladder is unremarkable. 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 Calcified nodules adjacent to the appendix are unchanged from prior examination, possibly representing the sequela of remote  trauma or inflammation. The stomach, small bowel, and large bowel are otherwise unremarkable. No evidence of obstruction or focal inflammation. No free intraperitoneal gas or fluid. Vascular/Lymphatic: Right perirenal adenopathy is again identified and appears stable to improved since prior examination. The index lymph node measures 20 x 22 mm at axial image # 26/2 previously measuring 26 x 23 mm. Remaining lymph nodes appears stable. No new pathologic adenopathy within the abdomen and pelvis. 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 The pelvic organs are otherwise unremarkable. Other: Diastasis of the rectus abdominus musculature is again noted without frank hernia formation. Rectum unremarkable Musculoskeletal: No acute bone abnormality. No lytic or blastic bone lesion. IMPRESSION: 1. No acute intracranial abnormality. No calvarial fracture. 2. No acute fracture or listhesis of the cervical spine. 3. No acute intra-abdominal pathology identified. 4. Stable 7.8 cm right renal mass in keeping with the patient's known primary renal cell carcinoma. Stable to slightly improved right perirenal adenopathy. 5. Interval increase in size of a right lower lobe pulmonary nodule and development of a smaller left lower lobe pulmonary nodule. While these findings may be infectious or inflammatory in nature, the possibility of metastatic disease is not excluded. Nonemergent dedicated CT imaging of the chest or PET CT examination may be helpful for further evaluation. Electronically Signed   By: Fidela Salisbury M.D.   On: 03/22/2022 21:49   CT Cervical Spine Wo Contrast  Result Date: 03/22/2022 CLINICAL DATA:  Abdominal pain, acute, nonlocalized; Head trauma, minor (Age >= 65y); Neck trauma (Age >= 65y) EXAM: CT HEAD WITHOUT CONTRAST CT CERVICAL SPINE WITHOUT CONTRAST CT ABDOMEN AND PELVIS WITHOUT CONTRAST TECHNIQUE: Contiguous axial images were obtained from the base of the skull through the vertex  without intravenous contrast. Multidetector CT imaging of the cervical spine was performed without intravenous contrast. Multiplanar CT image reconstructions were also generated.  Multidetector CT imaging of the abdomen and pelvis was performed following the standard protocol without IV contrast. RADIATION DOSE REDUCTION: This exam was performed according to the departmental dose-optimization program which includes automated exposure control, adjustment of the mA and/or kV according to patient size and/or use of iterative reconstruction technique. COMPARISON:  CT chest abdomen pelvis 01/19/2022, CT head/C-spine 09/09/2021 FINDINGS: CT HEAD FINDINGS Brain: Normal anatomic configuration. Parenchymal volume loss is commensurate with the patient's age. Mild periventricular white matter changes are present likely reflecting the sequela of small vessel ischemia. No abnormal intra or extra-axial mass lesion or fluid collection. No abnormal mass effect or midline shift. No evidence of acute intracranial hemorrhage or infarct. Ventricular size is normal. Cerebellum unremarkable. Vascular: No asymmetric hyperdense vasculature at the skull base. Skull: Intact Sinuses/Orbits: Mild mucosal thickening within the visualized right maxillary sinus. Remaining paranasal sinuses are clear. Orbits are unremarkable. Other: Mastoid air cells and middle ear cavities are clear. CT CERVICAL SPINE FINDINGS Alignment: Normal. Skull base and vertebrae: Craniocervical alignment is normal. The atlantodental interval is not widened. No acute fracture of the cervical spine. Vertebral body height is preserved. Soft tissues and spinal canal: No prevertebral fluid or swelling. No visible canal hematoma. No cervical adenopathy. Mild atherosclerotic calcification within the carotid bifurcations. 13 mm nodule within the left thyroid gland, not well characterized on this examination. Not clinically significant; no follow-up imaging recommended (ref: J Am  Coll Radiol. 2015 Feb;12(2): 143-50). Disc levels: There is intervertebral disc space narrowing, disc calcification, and endplate remodeling throughout the cervical spine in keeping with changes of diffuse advanced degenerative disc disease. The prevertebral soft tissues are not thickened. The spinal canal is widely patent. Multilevel uncovertebral and facet arthrosis results in multilevel mild neuroforaminal narrowing, most severe on the right at C3-4 and bilaterally at C5-6. Upper chest: Unremarkable Other: None CT ABDOMEN AND PELVIS FINDINGS Lower chest: Lobulated, spiculated nodule within the a right lower lobe has significantly enlarged in the interval since prior examination, now measuring 17 mm x 22 mm at axial image # 7/4. There is an internal air bronchograms noted, best appreciated on image # 4/4. Given its rate of growth as well as lack of significant mass effect upon the adjacent airway favors an inflammatory or lymphoproliferative etiology, as can be seen with sarcoidosis, or pulmonary lymphoma, however, a metastatic focus is not completely excluded. Interval development of a elongate 9 x 5 mm nodule within the left lower lobe, axial image # 12/4. No pleural effusion. Cardiac size is within normal limits. Hepatobiliary: No focal liver abnormality is seen. Status post cholecystectomy. No biliary dilatation. Pancreas: Unremarkable Spleen: Unremarkable Adrenals/Urinary Tract: The adrenal glands are unremarkable. 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 Left kidney is unremarkable on this noncontrast examination. No hydronephrosis. The bladder is unremarkable. 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 Calcified nodules adjacent to the appendix are unchanged from prior examination, possibly  representing the sequela of remote trauma or inflammation. The stomach, small bowel, and large bowel are otherwise unremarkable. No evidence of obstruction or focal inflammation. No free intraperitoneal gas or fluid. Vascular/Lymphatic: Right perirenal adenopathy is again identified and appears stable to improved since prior examination. The index lymph node measures 20 x 22 mm at axial image # 26/2 previously measuring 26 x 23 mm. Remaining lymph nodes  appears stable. No new pathologic adenopathy within the abdomen and pelvis. 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 The pelvic organs are otherwise unremarkable. Other: Diastasis of the rectus abdominus musculature is again noted without frank hernia formation. Rectum unremarkable Musculoskeletal: No acute bone abnormality. No lytic or blastic bone lesion. IMPRESSION: 1. No acute intracranial abnormality. No calvarial fracture. 2. No acute fracture or listhesis of the cervical spine. 3. No acute intra-abdominal pathology identified. 4. Stable 7.8 cm right renal mass in keeping with the patient's known primary renal cell carcinoma. Stable to slightly improved right perirenal adenopathy. 5. Interval increase in size of a right lower lobe pulmonary nodule and development of a smaller left lower lobe pulmonary nodule. While these findings may be infectious or inflammatory in nature, the possibility of metastatic disease is not excluded. Nonemergent dedicated CT imaging of the chest or PET CT examination may be helpful for further evaluation. Electronically Signed   By: Fidela Salisbury M.D.   On: 03/22/2022 21:49   CT ABDOMEN PELVIS WO CONTRAST  Result Date: 03/22/2022 CLINICAL DATA:  Abdominal pain, acute, nonlocalized; Head trauma, minor (Age >= 65y); Neck trauma (Age >= 65y) EXAM: CT HEAD WITHOUT CONTRAST CT CERVICAL SPINE WITHOUT CONTRAST CT ABDOMEN AND PELVIS WITHOUT CONTRAST TECHNIQUE: Contiguous axial images were obtained from the  base of the skull through the vertex without intravenous contrast. Multidetector CT imaging of the cervical spine was performed without intravenous contrast. Multiplanar CT image reconstructions were also generated. Multidetector CT imaging of the abdomen and pelvis was performed following the standard protocol without IV contrast. RADIATION DOSE REDUCTION: This exam was performed according to the departmental dose-optimization program which includes automated exposure control, adjustment of the mA and/or kV according to patient size and/or use of iterative reconstruction technique. COMPARISON:  CT chest abdomen pelvis 01/19/2022, CT head/C-spine 09/09/2021 FINDINGS: CT HEAD FINDINGS Brain: Normal anatomic configuration. Parenchymal volume loss is commensurate with the patient's age. Mild periventricular white matter changes are present likely reflecting the sequela of small vessel ischemia. No abnormal intra or extra-axial mass lesion or fluid collection. No abnormal mass effect or midline shift. No evidence of acute intracranial hemorrhage or infarct. Ventricular size is normal. Cerebellum unremarkable. Vascular: No asymmetric hyperdense vasculature at the skull base. Skull: Intact Sinuses/Orbits: Mild mucosal thickening within the visualized right maxillary sinus. Remaining paranasal sinuses are clear. Orbits are unremarkable. Other: Mastoid air cells and middle ear cavities are clear. CT CERVICAL SPINE FINDINGS Alignment: Normal. Skull base and vertebrae: Craniocervical alignment is normal. The atlantodental interval is not widened. No acute fracture of the cervical spine. Vertebral body height is preserved. Soft tissues and spinal canal: No prevertebral fluid or swelling. No visible canal hematoma. No cervical adenopathy. Mild atherosclerotic calcification within the carotid bifurcations. 13 mm nodule within the left thyroid gland, not well characterized on this examination. Not clinically significant; no  follow-up imaging recommended (ref: J Am Coll Radiol. 2015 Feb;12(2): 143-50). Disc levels: There is intervertebral disc space narrowing, disc calcification, and endplate remodeling throughout the cervical spine in keeping with changes of diffuse advanced degenerative disc disease. The prevertebral soft tissues are not thickened. The spinal canal is widely patent. Multilevel uncovertebral and facet arthrosis results in multilevel mild neuroforaminal narrowing, most severe on the right at C3-4 and bilaterally at C5-6. Upper chest: Unremarkable Other: None CT ABDOMEN AND PELVIS FINDINGS Lower chest: Lobulated, spiculated nodule within the a right lower lobe has significantly enlarged in the interval since prior examination, now measuring 17  mm x 22 mm at axial image # 7/4. There is an internal air bronchograms noted, best appreciated on image # 4/4. Given its rate of growth as well as lack of significant mass effect upon the adjacent airway favors an inflammatory or lymphoproliferative etiology, as can be seen with sarcoidosis, or pulmonary lymphoma, however, a metastatic focus is not completely excluded. Interval development of a elongate 9 x 5 mm nodule within the left lower lobe, axial image # 12/4. No pleural effusion. Cardiac size is within normal limits. Hepatobiliary: No focal liver abnormality is seen. Status post cholecystectomy. No biliary dilatation. Pancreas: Unremarkable Spleen: Unremarkable Adrenals/Urinary Tract: The adrenal glands are unremarkable. 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 Left kidney is unremarkable on this noncontrast examination. No hydronephrosis. The bladder is unremarkable. 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 Calcified nodules adjacent to the appendix are unchanged  from prior examination, possibly representing the sequela of remote trauma or inflammation. The stomach, small bowel, and large bowel are otherwise unremarkable. No evidence of obstruction or focal inflammation. No free intraperitoneal gas or fluid. Vascular/Lymphatic: Right perirenal adenopathy is again identified and appears stable to improved since prior examination. The index lymph node measures 20 x 22 mm at axial image # 26/2 previously measuring 26 x 23 mm. Remaining lymph nodes appears stable. No new pathologic adenopathy within the abdomen and pelvis. 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 The pelvic organs are otherwise unremarkable. Other: Diastasis of the rectus abdominus musculature is again noted without frank hernia formation. Rectum unremarkable Musculoskeletal: No acute bone abnormality. No lytic or blastic bone lesion. IMPRESSION: 1. No acute intracranial abnormality. No calvarial fracture. 2. No acute fracture or listhesis of the cervical spine. 3. No acute intra-abdominal pathology identified. 4. Stable 7.8 cm right renal mass in keeping with the patient's known primary renal cell carcinoma. Stable to slightly improved right perirenal adenopathy. 5. Interval increase in size of a right lower lobe pulmonary nodule and development of a smaller left lower lobe pulmonary nodule. While these findings may be infectious or inflammatory in nature, the possibility of metastatic disease is not excluded. Nonemergent dedicated CT imaging of the chest or PET CT examination may be helpful for further evaluation. Electronically Signed   By: Fidela Salisbury M.D.   On: 03/22/2022 21:49   DG Chest Port 1 View  Result Date: 03/22/2022 CLINICAL DATA:  Altered mental status EXAM: PORTABLE CHEST 1 VIEW COMPARISON:  09/09/2021, CT 01/19/2022 FINDINGS: Cardiomegaly with aortic atherosclerosis. No acute airspace disease, effusion or pneumothorax. Postsurgical changes in the epigastric  area IMPRESSION: No active disease.  Cardiomegaly Electronically Signed   By: Donavan Foil M.D.   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  According to patient's age POA [name] patient was very much with it and started having issues of disorientation more so after starting oral chemo early part of July. Patient lately has been noncompliant with the medication. She is been getting some care services to administer oral meds.   Altered mental status/Encephalopathy-- unclear  etiology - infectious etiology so far negative.  - MRI of the brain with contrast negative for metastasis or stroke  - CT abdomen pelvis and CT chest shows pulmonary nodules likely metastatic disease and existing renal mass -- continue to monitor fever curve. White count normal. --? 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 Tasia [facility] center -- she was recently started on oral inlyta. [facility] POA feels mental status  worsening after starting this medication. -- discussed with on call oncologist Dr. Janese Banks recommends hold oral chemo and patient will be assessed as outpatient with Dr. Tasia Catchings  A-fib [facility]) - Resumed home Eliquis  --continue diltiazem and metoprolol    Depression - Resumed home Lexapro   generalized weakness, failure to thrive -- seen by physical therapy recommends rehab -- [facility] for discharge planning      CODE STATUS-POA states that patient has a living will but states she does not want to be resuscitated     DVT prophylaxis: Apixaban  Code Status: DNR/DNI confirmed with [name] Diet: Heart healthy/carb modified, nectar thick Family Communication: Updated POA, [name] on the phone, and he can be reached at [phone]    The patient remains OBS appropriate and will d/c before 2 midnights.    TOTAL TIME TAKING CARE OF THIS PATIENT: 35 minutes.  >50% time spent on counselling and coordination of care  Note: This dictation was prepared with Dragon dictation along with smaller phrase technology. Any transcriptional errors that result from this process are unintentional.  Fritzi Mandes M.D    [facility]   CC: Primary care physician; Pcp, No

## 2022-03-23 NOTE — TOC Progression Note (Signed)
Transition of Care [facility]) - Progression Note    Patient Details  Name: Barbara Frank MRN: [id number] Date of Birth: 11/18/1945  Transition of Care [facility]) CM/SW [place], RN Phone Number: 03/23/2022, 3:01 PM  Clinical Narrative:     Damaris Schooner with [name] the POA he is agreeable to a bedsearch to go to [facility],   Expected Discharge Plan: Assisted Living Barriers to Discharge: Continued Medical Work up  Expected Discharge Plan and Services Expected Discharge Plan: Assisted Living   Discharge Planning Services: CM Consult   Living arrangements for the past 2 months: [place]                   DME Agency: NA       HH Arranged: PT, OT           Social Determinants of Health (SDOH) Interventions    Readmission Risk Interventions     No data to display

## 2022-03-23 NOTE — TOC Progression Note (Signed)
Transition of Care [facility]) - Progression Note    Patient Details  Name: Gwenivere Hiraldo MRN: [id number] Date of Birth: 06/08/1946  Transition of Care [facility]) CM/SW Contact  Conception Oms, RN Phone Number: 03/23/2022, 2:34 PM  Clinical Narrative:    Orville Govern the listed POA at [phone] and left a VM asking for a call back   Expected Discharge Plan: Assisted Living Barriers to Discharge: Continued Medical Work up  Expected Discharge Plan and Services Expected Discharge Plan: Assisted Living   Discharge Planning Services: CM Consult   Living arrangements for the past 2 months: [place]                   DME Agency: NA       HH Arranged: PT, OT           Social Determinants of Health (SDOH) Interventions    Readmission Risk Interventions     No data to display

## 2022-03-23 NOTE — TOC Progression Note (Signed)
Transition of Care [facility]) - Progression Note    Patient Details  Name: Barbara Frank MRN: [id number] Date of Birth: 16-Dec-1945  Transition of Care [facility]) CM/SW [place], RN Phone Number: 03/23/2022, 2:19 PM  Clinical Narrative:   Received a call from [facility], the patient gets medication assistance with [facility] and does PT/OT with [facility],     Expected Discharge Plan: Assisted Living Barriers to Discharge: Continued Medical Work up  Expected Discharge Plan and Services Expected Discharge Plan: Assisted Living   Discharge Planning Services: CM Consult   Living arrangements for the past 2 months: [place]                   DME Agency: NA       HH Arranged: PT, OT           Social Determinants of Health (SDOH) Interventions    Readmission Risk Interventions     No data to display

## 2022-03-23 NOTE — Evaluation (Signed)
Physical Therapy Evaluation Patient Details Name: Barbara Frank MRN: [id number] DOB: [birth year] Today's Date: 03/23/2022  History of Present Illness  [name] is a 76 year old female with history of atrial fibrillation on Eliquis, right-sided renal cell carcinoma CD10 positive, racemase positive, CK7 negative, insulin-dependent diabetes mellitus, hypertension, who presents emergency department from [facility] via EMS for chief concerns of altered mental status.   Clinical Impression  Patient received in bed, one leg hanging out of the bed. She is alert, but disoriented. She is having difficulty processing instructions for mobility. Required mod assist with bed mobility and demonstrates poor sitting balance. She is unsafe to attempt standing at this date. Patient will continue to benefit from skilled PT to improve functional mobility and independence for return to assisted living.        Recommendations for follow up therapy are one component of a multi-disciplinary discharge planning process, led by the attending physician.  Recommendations may be updated based on patient status, additional functional criteria and insurance authorization.  Follow Up Recommendations Skilled nursing-short term rehab (<3 hours/day) Can patient physically be transported by private vehicle: No    Assistance Recommended at Discharge Frequent or constant Supervision/Assistance  Patient can return home with the following  A lot of help with walking and/or transfers;A lot of help with bathing/dressing/bathroom;Help with stairs or ramp for entrance    Equipment Recommendations None recommended by PT  Recommendations for Other Services       Functional Status Assessment Patient has had a recent decline in their functional status and demonstrates the ability to make significant improvements in function in a reasonable and predictable amount of time.     Precautions / Restrictions  Precautions Precautions: Fall Restrictions Weight Bearing Restrictions: No      Mobility  Bed Mobility Overal bed mobility: Needs Assistance Bed Mobility: Supine to Sit, Sit to Supine     Supine to sit: Mod assist Sit to supine: Mod assist   General bed mobility comments: difficulty processing and initiating movement, poor sitting balance requiring assist to maintain    Transfers                   General transfer comment: unable/unsafe to attempt due to poor sitting balance    Ambulation/Gait               General Gait Details: unable to attempt  Stairs            Wheelchair Mobility    Modified Rankin (Stroke Patients Only)       Balance Overall balance assessment: Needs assistance Sitting-balance support: No upper extremity supported, Feet supported Sitting balance-Leahy Scale: Poor   Postural control: Posterior lean                                   Pertinent Vitals/Pain Pain Assessment Pain Assessment: No/denies pain    Home Living Family/patient expects to be discharged to:: Assisted living                 Home Equipment: Rollator (4 wheels)      Prior Function Prior Level of Function : Independent/Modified Independent             Mobility Comments: uses walker at baseline, patient confused and unable to elaborate on current mobility       Hand Dominance   Dominant Hand: Right    Extremity/Trunk Assessment  Upper Extremity Assessment Upper Extremity Assessment: Generalized weakness    Lower Extremity Assessment Lower Extremity Assessment: Generalized weakness       Communication   Communication: No difficulties;Other (comment) (confused)  Cognition Arousal/Alertness: Awake/alert Behavior During Therapy: Restless Overall Cognitive Status: Impaired/Different from baseline Area of Impairment: Orientation, Memory, Following commands, Safety/judgement, Awareness, Problem solving                  Orientation Level: Disoriented to, Place, Time, Situation   Memory: Decreased short-term memory Following Commands: Follows one step commands inconsistently, Follows one step commands with increased time Safety/Judgement: Decreased awareness of safety, Decreased awareness of deficits Awareness: Emergent Problem Solving: Requires verbal cues, Requires tactile cues, Slow processing, Decreased initiation, Difficulty sequencing General Comments: patient received with leg hanging off bed, discheveled.        General Comments      Exercises     Assessment/Plan    PT Assessment Patient needs continued PT services  PT Problem List Decreased strength;Decreased mobility;Decreased activity tolerance;Decreased balance;Decreased cognition;Decreased coordination       PT Treatment Interventions DME instruction;Therapeutic exercise;Gait training;Balance training;Functional mobility training;Therapeutic activities;Patient/family education    PT Goals (Current goals can be found in the Care Plan section)  Acute Rehab PT Goals Patient Stated Goal: patient unable to state PT Goal Formulation: Patient unable to participate in goal setting Time For Goal Achievement: 04/06/22    Frequency Min 2X/week     Co-evaluation               AM-PAC PT "6 Clicks" Mobility  Outcome Measure Help needed turning from your back to your side while in a flat bed without using bedrails?: A Lot Help needed moving from lying on your back to sitting on the side of a flat bed without using bedrails?: A Lot Help needed moving to and from a bed to a chair (including a wheelchair)?: Total Help needed standing up from a chair using your arms (e.g., wheelchair or bedside chair)?: Total Help needed to walk in hospital room?: Total Help needed climbing 3-5 steps with a railing? : Total 6 Click Score: 8    End of Session   Activity Tolerance: Other (comment) (cognition) Patient left: in bed;with  call bell/phone within reach;with bed alarm set Nurse Communication: Mobility status PT Visit Diagnosis: Other abnormalities of gait and mobility (R26.89)    Time: 0623-7628 PT Time Calculation (min) (ACUTE ONLY): 13 min   Charges:   PT Evaluation $PT Eval Moderate Complexity: 1 Mod          Kayin Osment, PT, GCS 03/23/22,1:12 PM

## 2022-03-23 NOTE — NC FL2 (Signed)
[place] LEVEL OF CARE SCREENING TOOL     IDENTIFICATION  Patient Name: Barbara Frank Birthdate: 27-Sep-1945 Sex: female Admission Date (Current Location): 03/22/2022  [facility] and [place] Number:  Engineering geologist and Address:  [facility], [address], [place],  [address]      Provider Number: [id number]  Attending Physician Name and Address:  Fritzi Mandes, MD  Relative Name and Phone Number:  Girtha Rm [phone]    Current Level of Care: Hospital Recommended Level of Care: [place] Prior Approval Number:    Date Approved/Denied:   PASRR Number: [id number]  Discharge Plan: SNF    Current Diagnoses: Patient Active Problem List   Diagnosis Date Noted   Altered mental status 03/22/2022   Noncompliance 01/29/2022   A-fib ([place]) 09/09/2021   Constipation    Generalized weakness    Fall at home, initial encounter 07/23/2021   Lung nodule 07/23/2021   Elevated lactic acid level 07/23/2021   Prolonged QT interval 07/23/2021   Atrial fibrillation, chronic (HCC)    Weakness    Palliative care encounter    Acute cystitis with hematuria    Type 2 diabetes mellitus without complication, with long-term current use of insulin (HCC)    Increased ammonia level    Encephalopathy 03/07/2021   Transient confusion 03/06/2021   Intertrigo 03/06/2021   Encounter for antineoplastic chemotherapy 01/08/2021   Encounter for antineoplastic immunotherapy 01/08/2021   Goals of care, counseling/discussion 10/17/2020   Hypercalcemia 10/17/2020   Chest mass 10/17/2020   Atrial fibrillation with RVR ([place]) 09/26/2020   Depression 09/26/2020   Diabetes type 2, uncontrolled 09/26/2020   Hypokalemia 09/26/2020   Renal cell carcinoma ([place]) 09/26/2020   Acute delirium 09/26/2020   Ureteral stone 09/26/2020    Orientation RESPIRATION BLADDER Height & Weight     Self  Normal Continent, External catheter Weight: 71  kg Height:  '5\' 2"'$  (157.5 cm)  BEHAVIORAL SYMPTOMS/MOOD NEUROLOGICAL BOWEL NUTRITION STATUS      Continent Diet  AMBULATORY STATUS COMMUNICATION OF NEEDS Skin   Extensive Assist Verbally Normal, Surgical wounds                       Personal Care Assistance Level of Assistance  Bathing, Feeding, Dressing Bathing Assistance: Limited assistance Feeding assistance: Limited assistance Dressing Assistance: Maximum assistance     Functional Limitations Info             SPECIAL CARE FACTORS FREQUENCY  PT (By licensed PT), OT (By licensed OT)     PT Frequency: 5 times per week OT Frequency: 5 times per week            Contractures Contractures Info: Not present    Additional Factors Info  Code Status, Allergies Code Status Info: Full code Allergies Info: Kiwi Extract           Current Medications (03/23/2022):  This is the current hospital active medication list Current Facility-Administered Medications  Medication Dose Route Frequency Provider Last Rate Last Admin   acetaminophen (TYLENOL) tablet 650 mg  650 mg Oral Q6H PRN Cox, Amy N, DO       Or   acetaminophen (TYLENOL) suppository 650 mg  650 mg Rectal Q6H PRN Cox, Amy N, DO       apixaban (ELIQUIS) tablet 5 mg  5 mg Oral BID Cox, Amy N, DO   5 mg at 03/23/22 0828   diltiazem (CARDIZEM CD) 24 hr  capsule 240 mg  240 mg Oral QPM Cox, Amy N, DO       escitalopram (LEXAPRO) tablet 10 mg  10 mg Oral Daily Cox, Amy N, DO   10 mg at 03/23/22 1062   insulin aspart (novoLOG) injection 0-15 Units  0-15 Units Subcutaneous TID WC Cox, Amy N, DO   3 Units at 03/23/22 1306   insulin aspart (novoLOG) injection 0-5 Units  0-5 Units Subcutaneous QHS Cox, Amy N, DO   2 Units at 03/23/22 0027   insulin glargine-yfgn (SEMGLEE) injection 27 Units  27 Units Subcutaneous BID Cox, Amy N, DO   27 Units at 03/23/22 0827   LORazepam (ATIVAN) injection 0.5 mg  0.5 mg Intravenous PRN Cox, Amy N, DO       melatonin tablet 5 mg  5 mg Oral  QHS PRN Cox, Amy N, DO       metoprolol succinate (TOPROL-XL) 24 hr tablet 25 mg  25 mg Oral Daily Cox, Amy N, DO   25 mg at 03/23/22 0829   metoprolol tartrate (LOPRESSOR) injection 5 mg  5 mg Intravenous Q2H PRN Cox, Amy N, DO       nystatin (MYCOSTATIN/NYSTOP) topical powder   Topical TID Fritzi Mandes, MD       ondansetron (ZOFRAN) tablet 4 mg  4 mg Oral Q6H PRN Cox, Amy N, DO       Or   ondansetron (ZOFRAN) injection 4 mg  4 mg Intravenous Q6H PRN Cox, Amy N, DO       polyethylene glycol (MIRALAX / GLYCOLAX) packet 17 g  17 g Oral BID PRN Cox, Amy N, DO       senna-docusate (Senokot-S) tablet 1 tablet  1 tablet Oral QHS PRN Cox, Amy N, DO       vitamin B-12 (CYANOCOBALAMIN) tablet 1,000 mcg  1,000 mcg Oral Daily Cox, Amy N, DO   1,000 mcg at 03/23/22 6948     Discharge Medications: Please see discharge summary for a list of discharge medications.  Relevant Imaging Results:  Relevant Lab Results:   Additional Information SSN: [id number]  Conception Oms, RN

## 2022-03-23 NOTE — Consult Note (Signed)
[place] Nurse Consult Note: Patient receiving care in [facility]. Reason for Consult: severe MASD to abdominal pannus and groin folds Wound type: Irritant Dermatitis L30.4  - Erythema intertrigo. Also used for abrasion of the hand, chafing of the skin, dermatitis due to sweating and friction, friction dermatitis, friction eczema, and genital/thigh intertrigo.   Pressure Injury POA: Yes/No/NA Measurement: Wound bed: very erythematous Drainage (amount, consistency, odor) none Periwound: intact Dressing procedure/placement/frequency: I found both antifungal powder and InterDry in the affected areas. I placed the following instructions: Measure and cut length of InterDry to fit in skin folds that have skin breakdown Tuck InterDry fabric into skin folds in a single layer, allow for 2 inches of overhang from skin edges to allow for wicking to occur May remove to bathe; dry area thoroughly and then tuck into affected areas again Do not apply any creams or ointments when using InterDry DO NOT THROW AWAY FOR 5 DAYS unless soiled with stool DO NOT [facility] product, this will inactivate the silver in the material  New sheet of Interdry should be applied after 5 days of use if patient continues to have skin breakdown   And, a nursing instruction order to use EITHER powder or InterDry, but do not combine the two.  Monitor the wound area(s) for worsening of condition such as: Signs/symptoms of infection,  Increase in size,  Development of or worsening of odor, Development of pain, or increased pain at the affected locations.  Notify the medical team if any of these develop.  Thank you for the consult.  Discussed plan of care with the patient and bedside nurse.  [place] nurse will not follow at this time.  Please re-consult the [place] team if needed.  Val Riles, RN, MSN, CWOCN, CNS-BC, pager [phone]

## 2022-03-23 NOTE — TOC Progression Note (Signed)
Transition of Care [facility]) - Progression Note    Patient Details  Name: Barbara Frank MRN: [id number] Date of Birth: 1945/12/16  Transition of Care [facility]) CM/SW [place], RN Phone Number: 03/23/2022, 11:41 AM  Clinical Narrative:    Patient resides at [place], I spoke with Rollene Fare at [facility], She has been getting PT and OT at [facility], Oncology is to see the patient, Rollene Fare will call me back with what they will need for her to return        Expected Discharge Plan and Services                                                 Social Determinants of Health (SDOH) Interventions    Readmission Risk Interventions     No data to display

## 2022-03-24 DIAGNOSIS — R4182 Altered mental status, unspecified: Secondary | ICD-10-CM | POA: Diagnosis not present

## 2022-03-24 LAB — HEPATIC FUNCTION PANEL
ALT: 11 U/L (ref 0–44)
AST: 19 U/L (ref 15–41)
Albumin: 2.9 g/dL — ABNORMAL LOW (ref 3.5–5.0)
Alkaline Phosphatase: 94 U/L (ref 38–126)
Bilirubin, Direct: <0.1 mg/dL (ref 0.0–0.2)
Total Bilirubin: 0.6 mg/dL (ref 0.3–1.2)
Total Protein: 6.1 g/dL — ABNORMAL LOW (ref 6.5–8.1)

## 2022-03-24 LAB — URINE CULTURE: Culture: <10000 — AB

## 2022-03-24 LAB — GLUCOSE, CAPILLARY
Glucose-Capillary: 153 mg/dL — ABNORMAL HIGH (ref 70–99)
Glucose-Capillary: 183 mg/dL — ABNORMAL HIGH (ref 70–99)
Glucose-Capillary: 154 mg/dL — ABNORMAL HIGH (ref 70–99)
Glucose-Capillary: 97 mg/dL (ref 70–99)
Glucose-Capillary: 123 mg/dL — ABNORMAL HIGH (ref 70–99)

## 2022-03-24 LAB — TSH: TSH: 2.372 u[IU]/mL (ref 0.350–4.500)

## 2022-03-24 MED ORDER — INSULIN GLARGINE-YFGN 100 UNIT/ML ~~LOC~~ SOLN
29.0000 [IU] | Freq: Two times a day (BID) | SUBCUTANEOUS | Status: DC
  Administered 2022-03-24 – 2022-03-26 (×4): 29 [IU] via SUBCUTANEOUS
  Filled 2022-03-24 (×5): qty 0.29

## 2022-03-24 NOTE — Progress Notes (Signed)
[facility] [place] NAME: Barbara Frank    MR#:  [id number]  DATE OF BIRTH:  04-17-1946  SUBJECTIVE:  No complaints. Tolerating diet. No pain.    VITALS:  Blood pressure 112/62, pulse 84, temperature 97.6 F (36.4 C), temperature source Oral, resp. rate 18, height '5\' 2"'$  (1.575 m), weight 71 kg, SpO2 98 %.  PHYSICAL EXAMINATION:  limited GENERAL:  76 y.o.-year-old patient lying in the bed with no acute distress.  LUNGS: Normal breath sounds bilaterally, no wheezing, rales, rhonchi.  CARDIOVASCULAR: S1, S2 normal. No murmurs, rubs, or gallops.  ABDOMEN: Soft, nontender, nondistended. Bowel sounds present.  EXTREMITIES: No  edema b/l.    NEUROLOGIC: nonfocal  patient is alert but confused and disoriented  SKIN: No obvious rash, lesion, or ulcer.   LABORATORY PANEL:  CBC Recent Labs  Lab 03/23/22 0416  WBC 7.9  HGB 13.6  HCT 42.8  PLT 202    Chemistries  Recent Labs  Lab 03/22/22 2020 03/23/22 0416  NA  --  139  K  --  3.3*  CL  --  105  CO2  --  29  GLUCOSE  --  170*  BUN  --  16  CREATININE  --  0.48  CALCIUM  --  9.9  AST 17  --   ALT 12  --   ALKPHOS 131*  --   BILITOT 0.7  --    Cardiac Enzymes No results for input(s): "TROPONINI" in the last 168 hours. RADIOLOGY:  MR BRAIN W WO CONTRAST  Result Date: 03/23/2022 CLINICAL DATA:  Renal cell carcinoma. Assessment for metastatic disease. EXAM: MRI HEAD WITHOUT AND WITH CONTRAST TECHNIQUE: Multiplanar, multiecho pulse sequences of the brain and surrounding structures were obtained without and with intravenous contrast. CONTRAST:  81m GADAVIST GADOBUTROL 1 MMOL/ML IV SOLN COMPARISON:  03/06/2021 brain MRI FINDINGS: Brain: No acute infarct, mass effect or extra-axial collection. No acute or chronic hemorrhage. There is multifocal hyperintense T2-weighted signal within the white matter. Parenchymal volume and CSF spaces are normal. Within that limitation, there are no  visible contrast-enhancing lesions. The midline structures are normal. Postcontrast images are severely motion degraded. Vascular: Major flow voids are preserved. Skull and upper cervical spine: Normal calvarium and skull base. Visualized upper cervical spine and soft tissues are normal. Sinuses/Orbits:No paranasal sinus fluid levels or advanced mucosal thickening. No mastoid or middle ear effusion. Normal orbits. IMPRESSION: 1. Severely motion degraded study without visible metastatic disease. 2. No acute intracranial abnormality. 3. Multifocal hyperintense T2-weighted signal within the white matter, most consistent with chronic microvascular ischemia. Electronically Signed   By: KUlyses JarredM.D.   On: 03/23/2022 00:18   CT Angio Chest Pulmonary Embolism (PE) W or WO Contrast  Result Date: 03/22/2022 CLINICAL DATA:  Altered mental status EXAM: CT ANGIOGRAPHY CHEST WITH CONTRAST TECHNIQUE: Multidetector CT imaging of the chest was performed using the standard protocol during bolus administration of intravenous contrast. Multiplanar CT image reconstructions and MIPs were obtained to evaluate the vascular anatomy. RADIATION DOSE REDUCTION: This exam was performed according to the departmental dose-optimization program which includes automated exposure control, adjustment of the mA and/or kV according to patient size and/or use of iterative reconstruction technique. CONTRAST:  739mOMNIPAQUE IOHEXOL 350 MG/ML SOLN COMPARISON:  CT 01/20/2019. 06/23/2021 CT FINDINGS: Cardiovascular: Satisfactory opacification of the pulmonary arteries to the segmental level. No evidence of pulmonary embolism. Mild cardiomegaly. No significant pericardial effusion. Mild aortic atherosclerosis without aneurysm. Enlarged  pulmonary trunk up to 4.2 cm. Mediastinum/Nodes: Midline trachea. No thyroid mass. No suspicious lymph nodes. Esophagus within normal limits. Lungs/Pleura: No pleural effusion or pneumothorax. Increased right lower  lobe irregular pulmonary nodule, previously 7 mm, now measuring 2 cm. Irregular pulmonary nodules in the right upper lobe measuring up to 9 mm, series 5, image 40. Upper Abdomen: Incompletely visualized large heterogeneous mass in the right kidney measures approximately 7.7 by 5.8 cm, previously 7.7 by 6.1 cm. Retroperitoneal lymph node measuring about 2.2 by 1.9 cm but incompletely visualized. Postsurgical changes of the stomach. Musculoskeletal: No acute osseous abnormality Review of the MIP images confirms the above findings. IMPRESSION: 1. Negative for acute pulmonary embolus. 2. Increased irregular right lower lobe pulmonary nodule, concerning for metastatic disease versus lung cancer. There is also increased right upper lobe pulmonary nodules compared to prior 3. Incompletely visualized large heterogeneous right renal mass corresponding to history of renal cell carcinoma, grossly stable in size. Adjacent right retroperitoneal node. 4. Mild cardiomegaly. Enlarged pulmonary trunk suggesting arterial hypertension Aortic Atherosclerosis (ICD10-I70.0). Electronically Signed   By: Donavan Foil M.D.   On: 03/22/2022 23:27   CT HEAD WO CONTRAST (5MM)  Result Date: 03/22/2022 CLINICAL DATA:  Abdominal pain, acute, nonlocalized; Head trauma, minor (Age >= 65y); Neck trauma (Age >= 65y) EXAM: CT HEAD WITHOUT CONTRAST CT CERVICAL SPINE WITHOUT CONTRAST CT ABDOMEN AND PELVIS WITHOUT CONTRAST TECHNIQUE: Contiguous axial images were obtained from the base of the skull through the vertex without intravenous contrast. Multidetector CT imaging of the cervical spine was performed without intravenous contrast. Multiplanar CT image reconstructions were also generated. Multidetector CT imaging of the abdomen and pelvis was performed following the standard protocol without IV contrast. RADIATION DOSE REDUCTION: This exam was performed according to the departmental dose-optimization program which includes automated exposure  control, adjustment of the mA and/or kV according to patient size and/or use of iterative reconstruction technique. COMPARISON:  CT chest abdomen pelvis 01/19/2022, CT head/C-spine 09/09/2021 FINDINGS: CT HEAD FINDINGS Brain: Normal anatomic configuration. Parenchymal volume loss is commensurate with the patient's age. Mild periventricular white matter changes are present likely reflecting the sequela of small vessel ischemia. No abnormal intra or extra-axial mass lesion or fluid collection. No abnormal mass effect or midline shift. No evidence of acute intracranial hemorrhage or infarct. Ventricular size is normal. Cerebellum unremarkable. Vascular: No asymmetric hyperdense vasculature at the skull base. Skull: Intact Sinuses/Orbits: Mild mucosal thickening within the visualized right maxillary sinus. Remaining paranasal sinuses are clear. Orbits are unremarkable. Other: Mastoid air cells and middle ear cavities are clear. CT CERVICAL SPINE FINDINGS Alignment: Normal. Skull base and vertebrae: Craniocervical alignment is normal. The atlantodental interval is not widened. No acute fracture of the cervical spine. Vertebral body height is preserved. Soft tissues and spinal canal: No prevertebral fluid or swelling. No visible canal hematoma. No cervical adenopathy. Mild atherosclerotic calcification within the carotid bifurcations. 13 mm nodule within the left thyroid gland, not well characterized on this examination. Not clinically significant; no follow-up imaging recommended (ref: J Am Coll Radiol. 2015 Feb;12(2): 143-50). Disc levels: There is intervertebral disc space narrowing, disc calcification, and endplate remodeling throughout the cervical spine in keeping with changes of diffuse advanced degenerative disc disease. The prevertebral soft tissues are not thickened. The spinal canal is widely patent. Multilevel uncovertebral and facet arthrosis results in multilevel mild neuroforaminal narrowing, most severe on  the right at C3-4 and bilaterally at C5-6. Upper chest: Unremarkable Other: None CT ABDOMEN AND PELVIS FINDINGS Lower chest:  Lobulated, spiculated nodule within the a right lower lobe has significantly enlarged in the interval since prior examination, now measuring 17 mm x 22 mm at axial image # 7/4. There is an internal air bronchograms noted, best appreciated on image # 4/4. Given its rate of growth as well as lack of significant mass effect upon the adjacent airway favors an inflammatory or lymphoproliferative etiology, as can be seen with sarcoidosis, or pulmonary lymphoma, however, a metastatic focus is not completely excluded. Interval development of a elongate 9 x 5 mm nodule within the left lower lobe, axial image # 12/4. No pleural effusion. Cardiac size is within normal limits. Hepatobiliary: No focal liver abnormality is seen. Status post cholecystectomy. No biliary dilatation. Pancreas: Unremarkable Spleen: Unremarkable Adrenals/Urinary Tract: The adrenal glands are unremarkable. 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 Left kidney is unremarkable on this noncontrast examination. No hydronephrosis. The bladder is unremarkable. 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 Calcified nodules adjacent to the appendix are unchanged from prior examination, possibly representing the sequela of remote trauma or inflammation. The stomach, small bowel, and large bowel are otherwise unremarkable. No evidence of obstruction or focal inflammation. No free intraperitoneal gas or fluid. Vascular/Lymphatic: Right perirenal adenopathy is again identified and appears stable to improved since prior examination. The index lymph node measures 20 x 22 mm at axial image # 26/2 previously measuring 26 x 23 mm. Remaining  lymph nodes appears stable. No new pathologic adenopathy within the abdomen and pelvis. 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 The pelvic organs are otherwise unremarkable. Other: Diastasis of the rectus abdominus musculature is again noted without frank hernia formation. Rectum unremarkable Musculoskeletal: No acute bone abnormality. No lytic or blastic bone lesion. IMPRESSION: 1. No acute intracranial abnormality. No calvarial fracture. 2. No acute fracture or listhesis of the cervical spine. 3. No acute intra-abdominal pathology identified. 4. Stable 7.8 cm right renal mass in keeping with the patient's known primary renal cell carcinoma. Stable to slightly improved right perirenal adenopathy. 5. Interval increase in size of a right lower lobe pulmonary nodule and development of a smaller left lower lobe pulmonary nodule. While these findings may be infectious or inflammatory in nature, the possibility of metastatic disease is not excluded. Nonemergent dedicated CT imaging of the chest or PET CT examination may be helpful for further evaluation. Electronically Signed   By: Fidela Salisbury M.D.   On: 03/22/2022 21:49   CT Cervical Spine Wo Contrast  Result Date: 03/22/2022 CLINICAL DATA:  Abdominal pain, acute, nonlocalized; Head trauma, minor (Age >= 65y); Neck trauma (Age >= 65y) EXAM: CT HEAD WITHOUT CONTRAST CT CERVICAL SPINE WITHOUT CONTRAST CT ABDOMEN AND PELVIS WITHOUT CONTRAST TECHNIQUE: Contiguous axial images were obtained from the base of the skull through the vertex without intravenous contrast. Multidetector CT imaging of the cervical spine was performed without intravenous contrast. Multiplanar CT image reconstructions were also generated. Multidetector CT imaging of the abdomen and pelvis was performed following the standard protocol without IV contrast. RADIATION DOSE REDUCTION: This exam was performed according to the departmental dose-optimization program which  includes automated exposure control, adjustment of the mA and/or kV according to patient size and/or use of iterative reconstruction technique. COMPARISON:  CT chest abdomen pelvis 01/19/2022, CT head/C-spine 09/09/2021 FINDINGS: CT HEAD FINDINGS Brain: Normal  anatomic configuration. Parenchymal volume loss is commensurate with the patient's age. Mild periventricular white matter changes are present likely reflecting the sequela of small vessel ischemia. No abnormal intra or extra-axial mass lesion or fluid collection. No abnormal mass effect or midline shift. No evidence of acute intracranial hemorrhage or infarct. Ventricular size is normal. Cerebellum unremarkable. Vascular: No asymmetric hyperdense vasculature at the skull base. Skull: Intact Sinuses/Orbits: Mild mucosal thickening within the visualized right maxillary sinus. Remaining paranasal sinuses are clear. Orbits are unremarkable. Other: Mastoid air cells and middle ear cavities are clear. CT CERVICAL SPINE FINDINGS Alignment: Normal. Skull base and vertebrae: Craniocervical alignment is normal. The atlantodental interval is not widened. No acute fracture of the cervical spine. Vertebral body height is preserved. Soft tissues and spinal canal: No prevertebral fluid or swelling. No visible canal hematoma. No cervical adenopathy. Mild atherosclerotic calcification within the carotid bifurcations. 13 mm nodule within the left thyroid gland, not well characterized on this examination. Not clinically significant; no follow-up imaging recommended (ref: J Am Coll Radiol. 2015 Feb;12(2): 143-50). Disc levels: There is intervertebral disc space narrowing, disc calcification, and endplate remodeling throughout the cervical spine in keeping with changes of diffuse advanced degenerative disc disease. The prevertebral soft tissues are not thickened. The spinal canal is widely patent. Multilevel uncovertebral and facet arthrosis results in multilevel mild  neuroforaminal narrowing, most severe on the right at C3-4 and bilaterally at C5-6. Upper chest: Unremarkable Other: None CT ABDOMEN AND PELVIS FINDINGS Lower chest: Lobulated, spiculated nodule within the a right lower lobe has significantly enlarged in the interval since prior examination, now measuring 17 mm x 22 mm at axial image # 7/4. There is an internal air bronchograms noted, best appreciated on image # 4/4. Given its rate of growth as well as lack of significant mass effect upon the adjacent airway favors an inflammatory or lymphoproliferative etiology, as can be seen with sarcoidosis, or pulmonary lymphoma, however, a metastatic focus is not completely excluded. Interval development of a elongate 9 x 5 mm nodule within the left lower lobe, axial image # 12/4. No pleural effusion. Cardiac size is within normal limits. Hepatobiliary: No focal liver abnormality is seen. Status post cholecystectomy. No biliary dilatation. Pancreas: Unremarkable Spleen: Unremarkable Adrenals/Urinary Tract: The adrenal glands are unremarkable. 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 Left kidney is unremarkable on this noncontrast examination. No hydronephrosis. The bladder is unremarkable. 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 Calcified nodules adjacent to the appendix are unchanged from prior examination, possibly representing the sequela of remote trauma or inflammation. The stomach, small bowel, and large bowel are otherwise unremarkable. No evidence of obstruction or focal inflammation. No free intraperitoneal gas or fluid. Vascular/Lymphatic: Right perirenal adenopathy is again identified and appears stable to improved since prior examination. The index lymph node measures 20 x 22 mm at axial image # 26/2  previously measuring 26 x 23 mm. Remaining lymph nodes appears stable. No new pathologic adenopathy within the abdomen and pelvis. 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 The pelvic organs are otherwise unremarkable. Other: Diastasis of the rectus abdominus musculature is again noted without frank hernia formation. Rectum unremarkable Musculoskeletal: No acute bone abnormality. No lytic or blastic bone lesion. IMPRESSION: 1. No acute intracranial abnormality. No calvarial fracture. 2. No acute fracture or  listhesis of the cervical spine. 3. No acute intra-abdominal pathology identified. 4. Stable 7.8 cm right renal mass in keeping with the patient's known primary renal cell carcinoma. Stable to slightly improved right perirenal adenopathy. 5. Interval increase in size of a right lower lobe pulmonary nodule and development of a smaller left lower lobe pulmonary nodule. While these findings may be infectious or inflammatory in nature, the possibility of metastatic disease is not excluded. Nonemergent dedicated CT imaging of the chest or PET CT examination may be helpful for further evaluation. Electronically Signed   By: Fidela Salisbury M.D.   On: 03/22/2022 21:49   CT ABDOMEN PELVIS WO CONTRAST  Result Date: 03/22/2022 CLINICAL DATA:  Abdominal pain, acute, nonlocalized; Head trauma, minor (Age >= 65y); Neck trauma (Age >= 65y) EXAM: CT HEAD WITHOUT CONTRAST CT CERVICAL SPINE WITHOUT CONTRAST CT ABDOMEN AND PELVIS WITHOUT CONTRAST TECHNIQUE: Contiguous axial images were obtained from the base of the skull through the vertex without intravenous contrast. Multidetector CT imaging of the cervical spine was performed without intravenous contrast. Multiplanar CT image reconstructions were also generated. Multidetector CT imaging of the abdomen and pelvis was performed following the standard protocol without IV contrast. RADIATION DOSE REDUCTION: This exam was performed according to the  departmental dose-optimization program which includes automated exposure control, adjustment of the mA and/or kV according to patient size and/or use of iterative reconstruction technique. COMPARISON:  CT chest abdomen pelvis 01/19/2022, CT head/C-spine 09/09/2021 FINDINGS: CT HEAD FINDINGS Brain: Normal anatomic configuration. Parenchymal volume loss is commensurate with the patient's age. Mild periventricular white matter changes are present likely reflecting the sequela of small vessel ischemia. No abnormal intra or extra-axial mass lesion or fluid collection. No abnormal mass effect or midline shift. No evidence of acute intracranial hemorrhage or infarct. Ventricular size is normal. Cerebellum unremarkable. Vascular: No asymmetric hyperdense vasculature at the skull base. Skull: Intact Sinuses/Orbits: Mild mucosal thickening within the visualized right maxillary sinus. Remaining paranasal sinuses are clear. Orbits are unremarkable. Other: Mastoid air cells and middle ear cavities are clear. CT CERVICAL SPINE FINDINGS Alignment: Normal. Skull base and vertebrae: Craniocervical alignment is normal. The atlantodental interval is not widened. No acute fracture of the cervical spine. Vertebral body height is preserved. Soft tissues and spinal canal: No prevertebral fluid or swelling. No visible canal hematoma. No cervical adenopathy. Mild atherosclerotic calcification within the carotid bifurcations. 13 mm nodule within the left thyroid gland, not well characterized on this examination. Not clinically significant; no follow-up imaging recommended (ref: J Am Coll Radiol. 2015 Feb;12(2): 143-50). Disc levels: There is intervertebral disc space narrowing, disc calcification, and endplate remodeling throughout the cervical spine in keeping with changes of diffuse advanced degenerative disc disease. The prevertebral soft tissues are not thickened. The spinal canal is widely patent. Multilevel uncovertebral and facet  arthrosis results in multilevel mild neuroforaminal narrowing, most severe on the right at C3-4 and bilaterally at C5-6. Upper chest: Unremarkable Other: None CT ABDOMEN AND PELVIS FINDINGS Lower chest: Lobulated, spiculated nodule within the a right lower lobe has significantly enlarged in the interval since prior examination, now measuring 17 mm x 22 mm at axial image # 7/4. There is an internal air bronchograms noted, best appreciated on image # 4/4. Given its rate of growth as well as lack of significant mass effect upon the adjacent airway favors an inflammatory or lymphoproliferative etiology, as can be seen with sarcoidosis, or pulmonary lymphoma, however, a metastatic focus is not completely excluded. Interval development of a elongate  9 x 5 mm nodule within the left lower lobe, axial image # 12/4. No pleural effusion. Cardiac size is within normal limits. Hepatobiliary: No focal liver abnormality is seen. Status post cholecystectomy. No biliary dilatation. Pancreas: Unremarkable Spleen: Unremarkable Adrenals/Urinary Tract: The adrenal glands are unremarkable. 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 Left kidney is unremarkable on this noncontrast examination. No hydronephrosis. The bladder is unremarkable. 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 Calcified nodules adjacent to the appendix are unchanged from prior examination, possibly representing the sequela of remote trauma or inflammation. The stomach, small bowel, and large bowel are otherwise unremarkable. No evidence of obstruction or focal inflammation. No free intraperitoneal gas or fluid. Vascular/Lymphatic: Right perirenal adenopathy is again identified and appears stable to improved since prior examination. The index lymph node measures  20 x 22 mm at axial image # 26/2 previously measuring 26 x 23 mm. Remaining lymph nodes appears stable. No new pathologic adenopathy within the abdomen and pelvis. 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 The pelvic organs are otherwise unremarkable. Other: Diastasis of the rectus abdominus musculature is again noted without frank hernia formation. Rectum unremarkable Musculoskeletal: No acute bone abnormality. No lytic or blastic bone lesion. IMPRESSION: 1. No acute intracranial abnormality. No calvarial fracture. 2. No acute fracture or listhesis of the cervical spine. 3. No acute intra-abdominal pathology identified. 4. Stable 7.8 cm right renal mass in keeping with the patient's known primary renal cell carcinoma. Stable to slightly improved right perirenal adenopathy. 5. Interval increase in size of a right lower lobe pulmonary nodule and development of a smaller left lower lobe pulmonary nodule. While these findings may be infectious or inflammatory in nature, the possibility of metastatic disease is not excluded. Nonemergent dedicated CT imaging of the chest or PET CT examination may be helpful for further evaluation. Electronically Signed   By: Fidela Salisbury M.D.   On: 03/22/2022 21:49   DG Chest Port 1 View  Result Date: 03/22/2022 CLINICAL DATA:  Altered mental status EXAM: PORTABLE CHEST 1 VIEW COMPARISON:  09/09/2021, CT 01/19/2022 FINDINGS: Cardiomegaly with aortic atherosclerosis. No acute airspace disease, effusion or pneumothorax. Postsurgical changes in the epigastric area IMPRESSION: No active disease.  Cardiomegaly Electronically Signed   By: Donavan Foil M.D.   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  According to patient's age POA [name] patient was very much with it and started having issues of disorientation more so after starting oral chemo early part of July. Patient lately has been noncompliant with the medication. She is been getting some care services to administer oral meds.   Altered mental status/Encephalopathy-- unclear etiology - infectious etiology so far negative.  - MRI of the brain with contrast negative for metastasis or stroke  - CT abdomen pelvis and CT chest shows pulmonary nodules likely metastatic disease and existing renal mass -- continue to monitor fever curve. White count normal. 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 -- she was recently started on oral inlyta. [facility] POA feels mental status  worsening after starting this medication. -- previous hospitalist discussed with on call oncologist Dr. Janese Banks recommends hold oral chemo and patient will be assessed as outpatient with Dr. Tasia Catchings  A-fib [facility]) Rate controlled currently - Resumed home Eliquis  --continue diltiazem and metoprolol    Depression - Resumed home Lexapro   generalized weakness, failure to thrive -- seen by physical therapy recommends rehab -- TOC for discharge planning, no bed offers today     DVT prophylaxis: Apixaban  Code Status: DNR/DNI confirmed with [name] Diet: Heart healthy/carb modified, nectar thick Family Communication: Updated POA, [name] on the phone 7/19      [name] M.D    [facility]   CC: Primary care physician; Pcp, No

## 2022-03-24 NOTE — Progress Notes (Signed)
Physical Therapy Treatment Patient Details Name: Barbara Frank MRN: [id number] DOB: Jul 03, 1946 Today's Date: 03/24/2022   History of Present Illness 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    PT Comments    Pt received in supine position and agreeable to therapy.  Pt still somewhat confused but became more oriented to things as session went along.  Pt participated in bed-level exercises and then became agreeable to ambulation.  Pt able to ambulate around the nursing station and back to room, but ended up stopping frequently to read signs/posters on the walls.  Pt demonstrates some safety concerns, but mobilized better today.  Pt left with all needs met and call bell within reach.  Pt still confused at times and needs the frequent supervision/assistance that would be provided by STR facility.  Pt to continue with current skilled therapy during hospital stay in order to improve overall function and mobility.     Recommendations for follow up therapy are one component of a multi-disciplinary discharge planning process, led by the attending physician.  Recommendations may be updated based on patient status, additional functional criteria and insurance authorization.  Follow Up Recommendations  Skilled nursing-short term rehab (<3 hours/day) Can patient physically be transported by private vehicle: No   Assistance Recommended at Discharge Frequent or constant Supervision/Assistance  Patient can return home with the following A lot of help with walking and/or transfers;A lot of help with bathing/dressing/bathroom;Help with stairs or ramp for entrance   Equipment Recommendations  None recommended by PT     Recommendations for Other Services       Precautions / Restrictions Precautions Precautions: Fall Restrictions Weight Bearing Restrictions: No     Mobility  Bed Mobility Overal bed mobility: Needs Assistance Bed Mobility: Supine to Sit, Sit to Supine     Supine to sit: Mod assist Sit to supine: Mod assist   General bed mobility comments: difficulty processing and initiating movement, better sitting balance today, but still has posterior lean at times.    Transfers Overall transfer level: Needs assistance Equipment used: Rolling walker (2 wheels) Transfers: Sit to/from Stand Sit to Stand: Min guard           General transfer comment: pt able to perform with slightly elevated bed surface.    Ambulation/Gait Ambulation/Gait assistance: Min guard Gait Distance (Feet): 160 Feet Assistive device: Rolling walker (2 wheels) Gait Pattern/deviations: WFL(Within Functional Limits) Gait velocity: decreased     General Gait Details: pt able to ambulate well, but stops to read most things on the wall.   Stairs             Wheelchair Mobility    Modified Rankin (Stroke Patients Only)       Balance Overall balance assessment: Needs assistance Sitting-balance support: No upper extremity supported, Feet supported Sitting balance-Leahy Scale: Poor   Postural control: Posterior lean                                  Cognition Arousal/Alertness: Awake/alert Behavior During Therapy: Flat affect Overall Cognitive Status: Impaired/Different from baseline Area of Impairment: Following commands, Awareness  Following Commands: Follows one step commands inconsistently, Follows one step commands with increased time                Exercises Total Joint Exercises Ankle Circles/Pumps: AROM, Strengthening, Both, 10 reps, Supine Quad Sets: AROM, Strengthening, Both, 10 reps, Supine Gluteal Sets: AROM, Strengthening,  Both, 10 reps, Supine Heel Slides: AROM, Strengthening, Both, 10 reps, Supine Hip ABduction/ADduction: AROM, Strengthening, Both, 10 reps, Supine Straight Leg Raises: AROM, Strengthening, Both, 10 reps, Supine Marching in Standing: AROM, Strengthening, Both, 10 reps, Standing    General Comments        Pertinent Vitals/Pain Pain Assessment Pain Assessment: No/denies pain    Home Living                          Prior Function            PT Goals (current goals can now be found in the care plan section) Acute Rehab PT Goals Patient Stated Goal: patient unable to state PT Goal Formulation: Patient unable to participate in goal setting Time For Goal Achievement: 04/06/22 Progress towards PT goals: Progressing toward goals    Frequency    Min 2X/week      PT Plan      Co-evaluation              AM-PAC PT "6 Clicks" Mobility   Outcome Measure  Help needed turning from your back to your side while in a flat bed without using bedrails?: A Lot Help needed moving from lying on your back to sitting on the side of a flat bed without using bedrails?: A Lot Help needed moving to and from a bed to a chair (including a wheelchair)?: A Lot Help needed standing up from a chair using your arms (e.g., wheelchair or bedside chair)?: A Little Help needed to walk in hospital room?: A Little Help needed climbing 3-5 steps with a railing? : A Lot 6 Click Score: 14    End of Session Equipment Utilized During Treatment: Gait belt Activity Tolerance: Patient tolerated treatment well (cognition) Patient left: in bed;with call bell/phone within reach;with bed alarm set Nurse Communication: Mobility status PT Visit Diagnosis: Other abnormalities of gait and mobility (R26.89)     Time: 1610-9604 PT Time Calculation (min) (ACUTE ONLY): 29 min  Charges:  $Gait Training: 8-22 mins $Therapeutic Exercise: 8-22 mins                     Gwenlyn Saran, PT, DPT 03/24/22,  4:16 PM

## 2022-03-25 ENCOUNTER — Other Ambulatory Visit: Payer: Medicare PPO | Admitting: Student

## 2022-03-25 DIAGNOSIS — Z794 Long term (current) use of insulin: Secondary | ICD-10-CM | POA: Diagnosis not present

## 2022-03-25 DIAGNOSIS — Z6828 Body mass index (BMI) 28.0-28.9, adult: Secondary | ICD-10-CM | POA: Diagnosis not present

## 2022-03-25 DIAGNOSIS — I10 Essential (primary) hypertension: Secondary | ICD-10-CM | POA: Diagnosis present

## 2022-03-25 DIAGNOSIS — Z7901 Long term (current) use of anticoagulants: Secondary | ICD-10-CM | POA: Diagnosis not present

## 2022-03-25 DIAGNOSIS — C7901 Secondary malignant neoplasm of right kidney and renal pelvis: Secondary | ICD-10-CM | POA: Diagnosis present

## 2022-03-25 DIAGNOSIS — R410 Disorientation, unspecified: Secondary | ICD-10-CM | POA: Diagnosis present

## 2022-03-25 DIAGNOSIS — L304 Erythema intertrigo: Secondary | ICD-10-CM | POA: Diagnosis present

## 2022-03-25 DIAGNOSIS — R4182 Altered mental status, unspecified: Secondary | ICD-10-CM | POA: Diagnosis present

## 2022-03-25 DIAGNOSIS — F32A Depression, unspecified: Secondary | ICD-10-CM | POA: Diagnosis present

## 2022-03-25 DIAGNOSIS — Z91148 Patient's other noncompliance with medication regimen for other reason: Secondary | ICD-10-CM | POA: Diagnosis not present

## 2022-03-25 DIAGNOSIS — Z20822 Contact with and (suspected) exposure to covid-19: Secondary | ICD-10-CM | POA: Diagnosis present

## 2022-03-25 DIAGNOSIS — R54 Age-related physical debility: Secondary | ICD-10-CM | POA: Diagnosis present

## 2022-03-25 DIAGNOSIS — Z803 Family history of malignant neoplasm of breast: Secondary | ICD-10-CM | POA: Diagnosis not present

## 2022-03-25 DIAGNOSIS — Z66 Do not resuscitate: Secondary | ICD-10-CM | POA: Diagnosis present

## 2022-03-25 DIAGNOSIS — Z7984 Long term (current) use of oral hypoglycemic drugs: Secondary | ICD-10-CM | POA: Diagnosis not present

## 2022-03-25 DIAGNOSIS — I482 Chronic atrial fibrillation, unspecified: Secondary | ICD-10-CM | POA: Diagnosis present

## 2022-03-25 DIAGNOSIS — C7801 Secondary malignant neoplasm of right lung: Secondary | ICD-10-CM | POA: Diagnosis present

## 2022-03-25 DIAGNOSIS — E119 Type 2 diabetes mellitus without complications: Secondary | ICD-10-CM | POA: Diagnosis present

## 2022-03-25 DIAGNOSIS — Z79899 Other long term (current) drug therapy: Secondary | ICD-10-CM | POA: Diagnosis not present

## 2022-03-25 DIAGNOSIS — R64 Cachexia: Secondary | ICD-10-CM | POA: Diagnosis present

## 2022-03-25 LAB — GLUCOSE, CAPILLARY
Glucose-Capillary: 94 mg/dL (ref 70–99)
Glucose-Capillary: 189 mg/dL — ABNORMAL HIGH (ref 70–99)
Glucose-Capillary: 231 mg/dL — ABNORMAL HIGH (ref 70–99)
Glucose-Capillary: 161 mg/dL — ABNORMAL HIGH (ref 70–99)

## 2022-03-25 LAB — BASIC METABOLIC PANEL
Anion gap: 3 — ABNORMAL LOW (ref 5–15)
BUN: 18 mg/dL (ref 8–23)
CO2: 30 mmol/L (ref 22–32)
Calcium: 10.1 mg/dL (ref 8.9–10.3)
Chloride: 105 mmol/L (ref 98–111)
Creatinine, Ser: 0.57 mg/dL (ref 0.44–1.00)
GFR, Estimated: >60 mL/min (ref >60)
Glucose, Bld: 123 mg/dL — ABNORMAL HIGH (ref 70–99)
Potassium: 3.8 mmol/L (ref 3.5–5.1)
Sodium: 138 mmol/L (ref 135–145)

## 2022-03-25 MED ORDER — SODIUM CHLORIDE 0.9 % IV BOLUS
1000.0000 mL | Freq: Once | INTRAVENOUS | Status: AC
  Administered 2022-03-25: 1000 mL via INTRAVENOUS

## 2022-03-25 MED ORDER — POLYETHYLENE GLYCOL 3350 17 G PO PACK
17.0000 g | PACK | Freq: Two times a day (BID) | ORAL | Status: DC
Start: 2022-03-25 — End: 2022-03-26
  Administered 2022-03-25 – 2022-03-26 (×2): 17 g via ORAL
  Filled 2022-03-25 (×2): qty 1

## 2022-03-25 NOTE — Progress Notes (Signed)
Physical Therapy Treatment Patient Details Name: Barbara Frank MRN: [id number] DOB: 02/01/1946 Today's Date: 03/25/2022   History of Present Illness 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    PT Comments    Patient continues to be quite confused, unable to tell me where she is. Difficulty with processing for mobility needs. She required mod A for bed mobility and demonstrates poor sitting balance. She is unable to achieve standing balance with +1 max assist and RW. With +2 mod assist patient is able to stand and pivot to [facility]. I was able to pivot her back to bed with +1 mod A. She is limited by cognition mainly. Patient will continue to benefit from skilled PT to improve independence, and safety.    Recommendations for follow up therapy are one component of a multi-disciplinary discharge planning process, led by the attending physician.  Recommendations may be updated based on patient status, additional functional criteria and insurance authorization.  Follow Up Recommendations  Skilled nursing-short term rehab (<3 hours/day) Can patient physically be transported by private vehicle: No   Assistance Recommended at Discharge Frequent or constant Supervision/Assistance  Patient can return home with the following A lot of help with walking and/or transfers;A lot of help with bathing/dressing/bathroom;Help with stairs or ramp for entrance;Assistance with feeding;Direct supervision/assist for medications management;Assist for transportation   Equipment Recommendations  None recommended by PT    Recommendations for Other Services       Precautions / Restrictions Precautions Precautions:  Fall Restrictions Weight Bearing Restrictions: No     Mobility  Bed Mobility Overal bed mobility: Needs Assistance Bed Mobility: Supine to Sit, Sit to Supine     Supine to sit: Min assist Sit to supine: Min assist   General bed mobility comments: difficulty processing and initiating movement, poor sitting balance, still has posterior lean and left lean  .    Transfers Overall transfer level: Needs assistance Equipment used: Rolling walker (2 wheels), 2 person hand held assist Transfers: Sit to/from Stand, Bed to chair/wheelchair/BSC Sit to Stand: Mod assist, From elevated surface           General transfer comment: initially stood with my assist alone, required max assistance with heavy left leaning in standing. Unable to get balance. The attempted again with +2 assist, required mod A to pivot to [facility]. Then I was able to assist her with +1 min A back to bed via pivoting  with RW    Ambulation/Gait               General Gait Details: unable due to poor balance, poor awareness   Stairs             Wheelchair Mobility    Modified Rankin (Stroke Patients Only)       Balance Overall balance assessment: Needs assistance Sitting-balance support: Feet supported Sitting balance-Leahy Scale: Poor Sitting balance - Comments: left leaning/posterior lean in sitting Postural control: Posterior lean, Left lateral lean Standing balance support: Bilateral upper extremity supported, During functional activity, Reliant on assistive device for balance Standing balance-Leahy Scale: Poor Standing balance comment: heavy left lean initially, then improved to min A after a little time  Cognition Arousal/Alertness: Awake/alert Behavior During Therapy: Flat affect Overall Cognitive Status: Impaired/Different from baseline Area of Impairment: Following commands, Awareness, Orientation, Safety/judgement, Problem solving                  Orientation Level: Disoriented to, Place, Time, Situation   Memory: Decreased short-term memory Following Commands: Follows one step commands inconsistently, Follows one step commands with increased time Safety/Judgement: Decreased awareness of safety, Decreased awareness of deficits Awareness: Emergent Problem Solving: Requires verbal cues, Requires tactile cues, Slow processing, Decreased initiation, Difficulty sequencing          Exercises      General Comments        Pertinent Vitals/Pain Pain Assessment Pain Assessment: No/denies pain    Home Living                          Prior Function            PT Goals (current goals can now be found in the care plan section) Acute Rehab PT Goals Patient Stated Goal: patient unable to state PT Goal Formulation: Patient unable to participate in goal setting Time For Goal Achievement: 04/06/22 Progress towards PT goals: Not progressing toward goals - comment (continues to be very limited with mobility due to confusion)    Frequency    Min 2X/week      PT Plan Current plan remains appropriate    Co-evaluation              AM-PAC PT "6 Clicks" Mobility   Outcome Measure  Help needed turning from your back to your side while in a flat bed without using bedrails?: A Lot Help needed moving from lying on your back to sitting on the side of a flat bed without using bedrails?: A Lot Help needed moving to and from a bed to a chair (including a wheelchair)?: A Lot Help needed standing up from a chair using your arms (e.g., wheelchair or bedside chair)?: A Lot Help needed to walk in hospital room?: A Lot Help needed climbing 3-5 steps with a railing? : Total 6 Click Score: 11    End of Session Equipment Utilized During Treatment: Gait belt Activity Tolerance: Patient tolerated treatment well Patient left: in bed;with call bell/phone within reach;with bed alarm set Nurse Communication: Mobility  status PT Visit Diagnosis: Other abnormalities of gait and mobility (R26.89);Muscle weakness (generalized) (M62.81);Difficulty in walking, not elsewhere classified (R26.2);Unsteadiness on feet (R26.81)     Time: 1610-9604 PT Time Calculation (min) (ACUTE ONLY): 17 min  Charges:  $Therapeutic Activity: 8-22 mins                     Mavrick Mcquigg, PT, GCS 03/25/22,2:50 PM

## 2022-03-25 NOTE — Plan of Care (Signed)
  Problem: Education: Goal: Ability to describe self-care measures that may prevent or decrease complications (Diabetes Survival Skills Education) will improve Outcome: Progressing Goal: Individualized Educational Video(s) Outcome: Progressing   Problem: Coping: Goal: Ability to adjust to condition or change in health will improve Outcome: Progressing   

## 2022-03-25 NOTE — Progress Notes (Signed)
[facility]) Hospital Liaison note:  This patient is currently enrolled in [facility] outpatient-based Palliative Care. Will continue to follow for disposition.  Please call with any outpatient palliative questions or concerns.  Thank you, Lorelee Market, LPN [facility] Liaison [phone]

## 2022-03-25 NOTE — Progress Notes (Signed)
Pt more confuse this afternoon and not cooperative with her care. Pt has not urinated since 9 a.m. Bladder scanned pt after multiple attempts, yield 177. Per patient she doesn't think she needs to use BSC. Attempted to get pt up to the bathroom but pt is refusing. Per pt wants to go home. Pt also refusing to take PO meds. Dr. Si Raider notified. Per MD will order fluid bolus.will continue to monitor.

## 2022-03-25 NOTE — Progress Notes (Signed)
[place] [facility] [place] NAME: Barbara Frank    MR#:  [id number]  DATE OF BIRTH:  18-May-1946  SUBJECTIVE:  No complaints. Tolerating diet. No pain. Hasn't had bmp   VITALS:  Blood pressure 130/69, pulse 75, temperature 98.1 F (36.7 C), temperature source Oral, resp. rate 17, height '5\' 2"'$  (1.575 m), weight 71 kg, SpO2 100 %.  PHYSICAL EXAMINATION:  limited GENERAL:  76 y.o.-year-old patient lying in the bed with no acute distress.  LUNGS: Normal breath sounds bilaterally, no wheezing, rales, rhonchi.  CARDIOVASCULAR: S1, S2 normal. No murmurs, rubs, or gallops.  ABDOMEN: Soft, nontender, nondistended. Bowel sounds present.  EXTREMITIES: No  edema b/l.    NEUROLOGIC: nonfocal  patient is alert but a big confused  SKIN: No obvious rash, lesion, or ulcer.   LABORATORY PANEL:  CBC Recent Labs  Lab 03/23/22 0416  WBC 7.9  HGB 13.6  HCT 42.8  PLT 202    Chemistries  Recent Labs  Lab 03/24/22 1331 03/25/22 0512  NA  --  138  K  --  3.8  CL  --  105  CO2  --  30  GLUCOSE  --  123*  BUN  --  18  CREATININE  --  0.57  CALCIUM  --  10.1  AST 19  --   ALT 11  --   ALKPHOS 94  --   BILITOT 0.6  --    Cardiac Enzymes No results for input(s): "TROPONINI" in the last 168 hours. RADIOLOGY:  No results found.  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  According to patient's age POA [name] patient was very much with it and started having issues of disorientation more so after starting oral chemo early part of July. Patient lately has been noncompliant with the medication. She is been getting some care services to administer oral  meds.   Altered mental status/Encephalopathy-- unclear etiology - infectious etiology so far negative.  - MRI of the brain with contrast negative for metastasis or stroke  - CT abdomen pelvis and CT chest shows pulmonary nodules likely metastatic disease and existing renal mass -- continue to monitor fever curve. White count normal. --normal electrolytes and tsh -- appears to be improving -- for snf, bed search underway  history of renal cell carcinoma with pulmonary nodules worrisome for metastasis -- patient [facility] with [facility]. [facility] -- she was recently started on oral inlyta. [facility] POA feels mental status  worsening after starting this medication. -- previous hospitalist discussed with on call oncologist Dr. Janese Banks recommends hold oral chemo and patient will be assessed as outpatient with Dr. Tasia Catchings  A-fib [facility]) Rate controlled currently - Resumed home Eliquis  --continue diltiazem and metoprolol    Depression - Resumed home Lexapro   generalized weakness, failure to thrive -- seen by physical therapy recommends rehab -- TOC for discharge planning, no bed offers today     DVT prophylaxis: Apixaban  Code Status: DNR/DNI confirmed with [name] Diet: Heart healthy/carb modified, nectar thick Family Communication: Updated POA, [name] on the phone 7/19      [name] M.D    [facility]   CC: Primary care physician; Pcp, No

## 2022-03-26 DIAGNOSIS — R4182 Altered mental status, unspecified: Secondary | ICD-10-CM | POA: Diagnosis not present

## 2022-03-26 LAB — GLUCOSE, CAPILLARY
Glucose-Capillary: 82 mg/dL (ref 70–99)
Glucose-Capillary: 199 mg/dL — ABNORMAL HIGH (ref 70–99)

## 2022-03-26 MED ORDER — INSULIN GLARGINE 100 UNIT/ML ~~LOC~~ SOLN: 25.0000 [IU] | Freq: Two times a day (BID) | SUBCUTANEOUS | 11 refills | Status: DC

## 2022-03-26 MED ORDER — INSULIN GLARGINE-YFGN 100 UNIT/ML ~~LOC~~ SOLN
25.0000 [IU] | Freq: Two times a day (BID) | SUBCUTANEOUS | Status: DC
  Filled 2022-03-26: qty 0.25

## 2022-03-26 NOTE — TOC Transition Note (Signed)
Transition of Care [facility]) - CM/SW Discharge Note   Patient Details  Name: Barbara Frank MRN: [id number] Date of Birth: 1946/07/11  [facility]) CM/SW Contact:  Eileen Stanford, LCSW Phone Number: 03/26/2022, 3:10 PM   Clinical Narrative:   Clinical Social Worker facilitated patient discharge including contacting patient family and facility to confirm patient discharge plans.  Clinical information faxed to facility and family agreeable with plan.  CSW arranged ambulance transport via [facility] to [facility] room 59B .  RN to call [phone] for report prior to discharge.    Final next level of care: Skilled Nursing Facility Barriers to Discharge: No Barriers Identified   Patient Goals and CMS Choice        Discharge Placement              Patient chooses bed at:  [facility]) Patient to be transferred to facility by: [facility] Name of family member notified: [name] Patient and family notified of of transfer: 03/26/22  Discharge Plan and Services   Discharge Planning Services: CM Consult              DME Agency: NA       HH Arranged: PT, OT          Social Determinants of Health (SDOH) Interventions     Readmission Risk Interventions     No data to display

## 2022-03-26 NOTE — TOC Progression Note (Addendum)
Transition of Care [facility]) - Progression Note    Patient Details  Name: Barbara Frank MRN: [id number] Date of Birth: 03-Apr-1946  Transition of Care [facility]) CM/SW Contact  Eileen Stanford, LCSW Phone Number: 03/26/2022, 2:35 PM  Clinical Narrative:   [facility] will accept and CSW obtained auth. CSW has called pt's POA twice, awaiting call back.   CSW spoke with pt's POA [name] and he is agreeable for pt to transfer to [facility] today. MD aware.   Expected Discharge Plan: Assisted Living Barriers to Discharge: Continued Medical Work up  Expected Discharge Plan and Services Expected Discharge Plan: Assisted Living   Discharge Planning Services: CM Consult   Living arrangements for the past 2 months: [place]                   DME Agency: NA       HH Arranged: PT, OT           Social Determinants of Health (SDOH) Interventions    Readmission Risk Interventions     No data to display

## 2022-03-26 NOTE — Plan of Care (Signed)
Patient sleeping between care. Oriented to self only. No complaints of pain or discomfort. No new changes in assessment. Incontinence care provided. Bed alarm on.   PLAN OF CARE ONGOING Problem: Education: Goal: Ability to describe self-care measures that may prevent or decrease complications (Diabetes Survival Skills Education) will improve Outcome: Progressing Goal: Individualized Educational Video(s) Outcome: Progressing   Problem: Coping: Goal: Ability to adjust to condition or change in health will improve Outcome: Progressing   Problem: Fluid Volume: Goal: Ability to maintain a balanced intake and output will improve Outcome: Progressing   Problem: Health Behavior/Discharge Planning: Goal: Ability to identify and utilize available resources and services will improve Outcome: Progressing Goal: Ability to manage health-related needs will improve Outcome: Progressing   Problem: Metabolic: Goal: Ability to maintain appropriate glucose levels will improve Outcome: Progressing   Problem: Nutritional: Goal: Maintenance of adequate nutrition will improve Outcome: Progressing Goal: Progress toward achieving an optimal weight will improve Outcome: Progressing   Problem: Skin Integrity: Goal: Risk for impaired skin integrity will decrease Outcome: Progressing   Problem: Tissue Perfusion: Goal: Adequacy of tissue perfusion will improve Outcome: Progressing   Problem: Education: Goal: Knowledge of General Education information will improve Description: Including pain rating scale, medication(s)/side effects and non-pharmacologic comfort measures Outcome: Progressing   Problem: Health Behavior/Discharge Planning: Goal: Ability to manage health-related needs will improve Outcome: Progressing   Problem: Clinical Measurements: Goal: Ability to maintain clinical measurements within normal limits will improve Outcome: Progressing Goal: Will remain free from infection Outcome:  Progressing Goal: Diagnostic test results will improve Outcome: Progressing Goal: Respiratory complications will improve Outcome: Progressing Goal: Cardiovascular complication will be avoided Outcome: Progressing   Problem: Activity: Goal: Risk for activity intolerance will decrease Outcome: Progressing   Problem: Nutrition: Goal: Adequate nutrition will be maintained Outcome: Progressing   Problem: Coping: Goal: Level of anxiety will decrease Outcome: Progressing   Problem: Elimination: Goal: Will not experience complications related to bowel motility Outcome: Progressing Goal: Will not experience complications related to urinary retention Outcome: Progressing   Problem: Pain Managment: Goal: General experience of comfort will improve Outcome: Progressing   Problem: Safety: Goal: Ability to remain free from injury will improve Outcome: Progressing   Problem: Skin Integrity: Goal: Risk for impaired skin integrity will decrease Outcome: Progressing

## 2022-03-26 NOTE — Progress Notes (Signed)
[place] [facility] [place] NAME: Barbara Frank    MR#:  [id number]  DATE OF BIRTH:  1945-12-26  SUBJECTIVE:  No complaints. Tolerating diet. No pain. Resting in bed   VITALS:  Blood pressure 126/83, pulse 88, temperature (!) 97 F (36.1 C), resp. rate 16, height '5\' 2"'$  (1.575 m), weight 71 kg, SpO2 100 %.  PHYSICAL EXAMINATION:  limited GENERAL:  76 y.o.-year-old patient lying in the bed with no acute distress.  LUNGS: Normal breath sounds bilaterally, no wheezing, rales, rhonchi.  CARDIOVASCULAR: S1, S2 normal. No murmurs, rubs, or gallops.  ABDOMEN: Soft, nontender, nondistended. Bowel sounds present.  EXTREMITIES: No  edema b/l.    NEUROLOGIC: nonfocal  patient is alert but a big confused  SKIN: No obvious rash, lesion, or ulcer.   LABORATORY PANEL:  CBC Recent Labs  Lab 03/23/22 0416  WBC 7.9  HGB 13.6  HCT 42.8  PLT 202    Chemistries  Recent Labs  Lab 03/24/22 1331 03/25/22 0512  NA  --  138  K  --  3.8  CL  --  105  CO2  --  30  GLUCOSE  --  123*  BUN  --  18  CREATININE  --  0.57  CALCIUM  --  10.1  AST 19  --   ALT 11  --   ALKPHOS 94  --   BILITOT 0.6  --    Cardiac Enzymes No results for input(s): "TROPONINI" in the last 168 hours. RADIOLOGY:  No results found.  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  According to patient's age POA [name] patient was very much with it and started having issues of disorientation more so after starting oral chemo early part of July. Patient lately has been noncompliant with the medication. She is been getting some care services to administer oral meds.   Delirium -  infectious etiology so far negative.  - MRI of the brain with contrast negative for metastasis or stroke  - CT abdomen pelvis and CT chest shows pulmonary nodules likely metastatic disease and existing renal mass -- continue to monitor fever curve. White count normal. --normal electrolytes and tsh -- waxes and wanes, suggestive of delirium -- for snf, bed search underway  history of renal cell carcinoma with pulmonary nodules worrisome for metastasis -- patient follows with Dr. Tasia Catchings cancer center -- she was recently started on oral inlyta. [facility] POA feels mental status  worsening after starting this medication. -- previous hospitalist discussed with on call oncologist Dr. Janese Banks recommends hold oral chemo and patient will be assessed as outpatient with Dr. Tasia Catchings  A-fib [facility]) Rate controlled currently - Resumed home Eliquis  --continue diltiazem and metoprolol    Depression - Resumed home Lexapro   generalized weakness, failure to thrive -- seen by physical therapy recommends rehab -- TOC for discharge planning, no bed offers today     DVT prophylaxis: Apixaban  Code Status: DNR/DNI confirmed with [name] Diet: Heart healthy/carb modified, nectar thick Family Communication: Updated POA, [name] on the phone 7/21      [name] M.D   [facility]   CC: Primary care physician; Pcp, No

## 2022-03-26 NOTE — Progress Notes (Signed)
Handoff report called to [facility] to [place], [place] who will be assuming care of this patient upon arrival to [facility] facility. All questions asked and answered, left my mobile number w/ Claiborne Billings in the event there were any additional questions regarding this patient. Handoff charted in flowsheets according to facility protocol. Pt has been given incontinent care and prepped for transport, all active Ivs Removed from patient and LDA, patient resting comfortably w/ possessions packed and sitting at bedside for transport. Charge Estill Bamberg made aware of ready discharge.

## 2022-03-26 NOTE — Discharge Summary (Signed)
Barbara Frank IRJ:[id number] DOB: 1946-08-25 DOA: 03/22/2022  PCP: Pcp, No  Admit date: 03/22/2022 Discharge date: 03/26/2022  Time spent: 35 minutes  Recommendations for Outpatient Follow-up:  Oncology f/u as scheduled    Discharge Diagnoses:  Principal Problem:   Altered mental status Active Problems:   Atrial fibrillation with RVR ([place])   Depression   Renal cell carcinoma ([place])   Encephalopathy   Type 2 diabetes mellitus without complication, with long-term current use of insulin ([facility])   Weakness   Generalized weakness   A-fib ([place])   Discharge Condition: stable  Diet recommendation: regular  Filed Weights   03/22/22 2003  Weight: 71 kg    History of present illness:  From admission h and p by dr. Tobie Poet [name] is a 76 year old female with history of atrial fibrillation on Eliquis, right-sided renal cell carcinoma CD10 positive, racemase positive, CK7 negative, insulin-dependent diabetes mellitus, hypertension, who presents emergency department from [facility] via EMS for chief concerns of altered mental status.   At bedside patient was able to tell me her full name, her age, and she was able to tell me the current calendar year with some difficulty.  She was not able to tell me the current location.   She reports not being any pain.   Healthcare power of attorney, Mr. [name] was at bedside and denied any reported fever, diarrhea, vomiting at [facility].   He reports that he saw her on 03/21/2022 evening and she stated to him that she has not been eating very well.  He did notice that there were no forks or spoons left out and it is usually the case when she has eaten.   He reports that she is noncompliant with medications so he does not know when the last time she has taken her medications appropriately.   He states that he did notice that she has been more confused since starting her antineoplastic medications about 2 weeks ago.    Hospital  Course:  Delirium - urine culture negative - MRI of the brain with contrast negative for metastasis or stroke  - CT abdomen pelvis and CT chest shows pulmonary nodules likely metastatic disease and existing renal mass - advancing malignancy thus likely contributory --normal electrolytes and tsh -- waxes and wanes, suggestive of delirium -- d/c to snf   history of renal cell carcinoma with pulmonary nodules worrisome for metastasis -- patient follows with Dr. Tasia Catchings cancer center -- she was recently started on oral inlyta. [facility] POA feels mental status  worsening after starting this medication. -- previous hospitalist discussed with on call oncologist Dr. Janese Banks recommends hold oral chemo and patient will be assessed as outpatient with Dr. Tasia Catchings   A-fib [facility]) Rate controlled currently - home Eliquis  --home diltiazem and metoprolol   Procedures: none   Consultations: none  Discharge Exam: Vitals:   03/26/22 0442 03/26/22 0812  BP: 121/70 126/83  Pulse: 90 88  Resp: 18 16  Temp: 98 F (36.7 C) (!) 97 F (36.1 C)  SpO2: 98% 100%    GENERAL:  76 y.o.-year-old patient lying in the bed with no acute distress.  LUNGS: Normal breath sounds bilaterally, no wheezing, rales, rhonchi.  CARDIOVASCULAR: S1, S2 normal. No murmurs, rubs, or gallops.  ABDOMEN: Soft, nontender, nondistended. Bowel sounds present.  EXTREMITIES: No  edema b/l.    NEUROLOGIC: nonfocal  patient is alert but a big confused  SKIN: No obvious rash, lesion, or ulcer.   Discharge Instructions  Discharge Instructions     Diet - low sodium heart healthy   Complete by: As directed    Discharge wound care:   Complete by: As directed    For the pannus and groin areas, use EITHER InterDry or Antifungal powder, do not combine the two.   Increase activity slowly   Complete by: As directed       Allergies as of 03/26/2022       Reactions   Kiwi Extract         Medication List     STOP taking these medications     Inlyta 5 MG tablet Generic drug: axitinib       TAKE these medications    apixaban 5 MG Tabs tablet Commonly known as: ELIQUIS Take 1 tablet (5 mg total) by mouth 2 (two) times daily.   cetirizine 10 MG tablet Commonly known as: ZYRTEC Take 0.5 tablets (5 mg total) by mouth daily.   cyanocobalamin 1000 MCG tablet Take 1,000 mcg by mouth daily.   diltiazem 240 MG 24 hr capsule Commonly known as: CARDIZEM CD Take 1 capsule (240 mg total) by mouth every evening.   escitalopram 10 MG tablet Commonly known as: LEXAPRO Take 10 mg by mouth daily.   insulin aspart 100 UNIT/ML injection Commonly known as: novoLOG Inject 0-15 Units into the skin 3 (three) times daily with meals. CBG 70 - 120: 0 units,  121 - 150: 2 units, 151 - 200: 3 units,  201 - 250: 5 units,  251 - 300: 8 units, 301 - 350: 11 units 351 - 400: 15 units, CBG > 400: call MD and obtain STAT lab verification   insulin aspart 100 UNIT/ML injection Commonly known as: novoLOG Inject 4 Units into the skin 3 (three) times daily with meals.   insulin glargine 100 UNIT/ML injection Commonly known as: LANTUS Inject 0.25 mLs (25 Units total) into the skin 2 (two) times daily. What changed: how much to take   metFORMIN 500 MG 24 hr tablet Commonly known as: GLUCOPHAGE-XR Take 1 tablet (500 mg total) by mouth 2 (two) times daily. What changed:  how much to take when to take this   metoprolol succinate 25 MG 24 hr tablet Commonly known as: TOPROL-XL Take 25 mg by mouth daily.               Discharge Care Instructions  (From admission, onward)           Start     Ordered   03/26/22 0000  Discharge wound care:       Comments: For the pannus and groin areas, use EITHER InterDry or Antifungal powder, do not combine the two.   03/26/22 1451           Allergies  Allergen Reactions   Kiwi Extract       The results of significant diagnostics from this hospitalization (including imaging,  microbiology, ancillary and laboratory) are listed below for reference.    Significant Diagnostic Studies: MR BRAIN W WO CONTRAST  Result Date: 03/23/2022 CLINICAL DATA:  Renal cell carcinoma. Assessment for metastatic disease. EXAM: MRI HEAD WITHOUT AND WITH CONTRAST TECHNIQUE: Multiplanar, multiecho pulse sequences of the brain and surrounding structures were obtained without and with intravenous contrast. CONTRAST:  35m GADAVIST GADOBUTROL 1 MMOL/ML IV SOLN COMPARISON:  03/06/2021 brain MRI FINDINGS: Brain: No acute infarct, mass effect or extra-axial collection. No acute or chronic hemorrhage. There is multifocal hyperintense T2-weighted signal within the white matter. Parenchymal  volume and CSF spaces are normal. Within that limitation, there are no visible contrast-enhancing lesions. The midline structures are normal. Postcontrast images are severely motion degraded. Vascular: Major flow voids are preserved. Skull and upper cervical spine: Normal calvarium and skull base. Visualized upper cervical spine and soft tissues are normal. Sinuses/Orbits:No paranasal sinus fluid levels or advanced mucosal thickening. No mastoid or middle ear effusion. Normal orbits. IMPRESSION: 1. Severely motion degraded study without visible metastatic disease. 2. No acute intracranial abnormality. 3. Multifocal hyperintense T2-weighted signal within the white matter, most consistent with chronic microvascular ischemia. Electronically Signed   By: Ulyses Jarred M.D.   On: 03/23/2022 00:18   CT Angio Chest Pulmonary Embolism (PE) W or WO Contrast  Result Date: 03/22/2022 CLINICAL DATA:  Altered mental status EXAM: CT ANGIOGRAPHY CHEST WITH CONTRAST TECHNIQUE: Multidetector CT imaging of the chest was performed using the standard protocol during bolus administration of intravenous contrast. Multiplanar CT image reconstructions and MIPs were obtained to evaluate the vascular anatomy. RADIATION DOSE REDUCTION: This exam was  performed according to the departmental dose-optimization program which includes automated exposure control, adjustment of the mA and/or kV according to patient size and/or use of iterative reconstruction technique. CONTRAST:  18m OMNIPAQUE IOHEXOL 350 MG/ML SOLN COMPARISON:  CT 01/20/2019. 06/23/2021 CT FINDINGS: Cardiovascular: Satisfactory opacification of the pulmonary arteries to the segmental level. No evidence of pulmonary embolism. Mild cardiomegaly. No significant pericardial effusion. Mild aortic atherosclerosis without aneurysm. Enlarged pulmonary trunk up to 4.2 cm. Mediastinum/Nodes: Midline trachea. No thyroid mass. No suspicious lymph nodes. Esophagus within normal limits. Lungs/Pleura: No pleural effusion or pneumothorax. Increased right lower lobe irregular pulmonary nodule, previously 7 mm, now measuring 2 cm. Irregular pulmonary nodules in the right upper lobe measuring up to 9 mm, series 5, image 40. Upper Abdomen: Incompletely visualized large heterogeneous mass in the right kidney measures approximately 7.7 by 5.8 cm, previously 7.7 by 6.1 cm. Retroperitoneal lymph node measuring about 2.2 by 1.9 cm but incompletely visualized. Postsurgical changes of the stomach. Musculoskeletal: No acute osseous abnormality Review of the MIP images confirms the above findings. IMPRESSION: 1. Negative for acute pulmonary embolus. 2. Increased irregular right lower lobe pulmonary nodule, concerning for metastatic disease versus lung cancer. There is also increased right upper lobe pulmonary nodules compared to prior 3. Incompletely visualized large heterogeneous right renal mass corresponding to history of renal cell carcinoma, grossly stable in size. Adjacent right retroperitoneal node. 4. Mild cardiomegaly. Enlarged pulmonary trunk suggesting arterial hypertension Aortic Atherosclerosis (ICD10-I70.0). Electronically Signed   By: KDonavan FoilM.D.   On: 03/22/2022 23:27   CT HEAD WO CONTRAST  (5MM)  Result Date: 03/22/2022 CLINICAL DATA:  Abdominal pain, acute, nonlocalized; Head trauma, minor (Age >= 65y); Neck trauma (Age >= 65y) EXAM: CT HEAD WITHOUT CONTRAST CT CERVICAL SPINE WITHOUT CONTRAST CT ABDOMEN AND PELVIS WITHOUT CONTRAST TECHNIQUE: Contiguous axial images were obtained from the base of the skull through the vertex without intravenous contrast. Multidetector CT imaging of the cervical spine was performed without intravenous contrast. Multiplanar CT image reconstructions were also generated. Multidetector CT imaging of the abdomen and pelvis was performed following the standard protocol without IV contrast. RADIATION DOSE REDUCTION: This exam was performed according to the departmental dose-optimization program which includes automated exposure control, adjustment of the mA and/or kV according to patient size and/or use of iterative reconstruction technique. COMPARISON:  CT chest abdomen pelvis 01/19/2022, CT head/C-spine 09/09/2021 FINDINGS: CT HEAD FINDINGS Brain: Normal anatomic configuration. Parenchymal volume loss is commensurate  with the patient's age. Mild periventricular white matter changes are present likely reflecting the sequela of small vessel ischemia. No abnormal intra or extra-axial mass lesion or fluid collection. No abnormal mass effect or midline shift. No evidence of acute intracranial hemorrhage or infarct. Ventricular size is normal. Cerebellum unremarkable. Vascular: No asymmetric hyperdense vasculature at the skull base. Skull: Intact Sinuses/Orbits: Mild mucosal thickening within the visualized right maxillary sinus. Remaining paranasal sinuses are clear. Orbits are unremarkable. Other: Mastoid air cells and middle ear cavities are clear. CT CERVICAL SPINE FINDINGS Alignment: Normal. Skull base and vertebrae: Craniocervical alignment is normal. The atlantodental interval is not widened. No acute fracture of the cervical spine. Vertebral body height is preserved.  Soft tissues and spinal canal: No prevertebral fluid or swelling. No visible canal hematoma. No cervical adenopathy. Mild atherosclerotic calcification within the carotid bifurcations. 13 mm nodule within the left thyroid gland, not well characterized on this examination. Not clinically significant; no follow-up imaging recommended (ref: J Am Coll Radiol. 2015 Feb;12(2): 143-50). Disc levels: There is intervertebral disc space narrowing, disc calcification, and endplate remodeling throughout the cervical spine in keeping with changes of diffuse advanced degenerative disc disease. The prevertebral soft tissues are not thickened. The spinal canal is widely patent. Multilevel uncovertebral and facet arthrosis results in multilevel mild neuroforaminal narrowing, most severe on the right at C3-4 and bilaterally at C5-6. Upper chest: Unremarkable Other: None CT ABDOMEN AND PELVIS FINDINGS Lower chest: Lobulated, spiculated nodule within the a right lower lobe has significantly enlarged in the interval since prior examination, now measuring 17 mm x 22 mm at axial image # 7/4. There is an internal air bronchograms noted, best appreciated on image # 4/4. Given its rate of growth as well as lack of significant mass effect upon the adjacent airway favors an inflammatory or lymphoproliferative etiology, as can be seen with sarcoidosis, or pulmonary lymphoma, however, a metastatic focus is not completely excluded. Interval development of a elongate 9 x 5 mm nodule within the left lower lobe, axial image # 12/4. No pleural effusion. Cardiac size is within normal limits. Hepatobiliary: No focal liver abnormality is seen. Status post cholecystectomy. No biliary dilatation. Pancreas: Unremarkable Spleen: Unremarkable Adrenals/Urinary Tract: The adrenal glands are unremarkable. A a solid, rounded mass is again identified within the right kidney anteriorly measuring 5.5 x 7.8 x 7.3 cm in greatest dimension, grossly stable since prior  exam, in keeping with the patient's known primary renal cell carcinoma. Left kidney is unremarkable on this noncontrast examination. No hydronephrosis. The bladder is unremarkable. 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 Calcified nodules adjacent to the appendix are unchanged from prior examination, possibly representing the sequela of remote trauma or inflammation. The stomach, small bowel, and large bowel are otherwise unremarkable. No evidence of obstruction or focal inflammation. No free intraperitoneal gas or fluid. Vascular/Lymphatic: Right perirenal adenopathy is again identified and appears stable to improved since prior examination. The index lymph node measures 20 x 22 mm at axial image # 26/2 previously measuring 26 x 23 mm. Remaining lymph nodes appears stable. No new pathologic adenopathy within the abdomen and pelvis. 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 The pelvic organs are otherwise unremarkable. Other: Diastasis of the rectus abdominus musculature is again noted without frank hernia formation. Rectum unremarkable Musculoskeletal: No acute bone abnormality. No lytic or blastic bone lesion. IMPRESSION: 1. No acute intracranial abnormality. No calvarial fracture. 2. No acute fracture or listhesis of the cervical spine. 3. No  acute intra-abdominal pathology identified. 4. Stable 7.8 cm right renal mass in keeping with the patient's known primary renal cell carcinoma. Stable to slightly improved right perirenal adenopathy. 5. Interval increase in size of a right lower lobe pulmonary nodule and development of a smaller left lower lobe pulmonary nodule. While these findings may be infectious or inflammatory in nature, the possibility of metastatic disease is not excluded. Nonemergent dedicated CT imaging of the chest or PET CT examination may be helpful for further evaluation. Electronically Signed   By: Fidela Salisbury M.D.   On: 03/22/2022 21:49    CT Cervical Spine Wo Contrast  Result Date: 03/22/2022 CLINICAL DATA:  Abdominal pain, acute, nonlocalized; Head trauma, minor (Age >= 65y); Neck trauma (Age >= 65y) EXAM: CT HEAD WITHOUT CONTRAST CT CERVICAL SPINE WITHOUT CONTRAST CT ABDOMEN AND PELVIS WITHOUT CONTRAST TECHNIQUE: Contiguous axial images were obtained from the base of the skull through the vertex without intravenous contrast. Multidetector CT imaging of the cervical spine was performed without intravenous contrast. Multiplanar CT image reconstructions were also generated. Multidetector CT imaging of the abdomen and pelvis was performed following the standard protocol without IV contrast. RADIATION DOSE REDUCTION: This exam was performed according to the departmental dose-optimization program which includes automated exposure control, adjustment of the mA and/or kV according to patient size and/or use of iterative reconstruction technique. COMPARISON:  CT chest abdomen pelvis 01/19/2022, CT head/C-spine 09/09/2021 FINDINGS: CT HEAD FINDINGS Brain: Normal anatomic configuration. Parenchymal volume loss is commensurate with the patient's age. Mild periventricular white matter changes are present likely reflecting the sequela of small vessel ischemia. No abnormal intra or extra-axial mass lesion or fluid collection. No abnormal mass effect or midline shift. No evidence of acute intracranial hemorrhage or infarct. Ventricular size is normal. Cerebellum unremarkable. Vascular: No asymmetric hyperdense vasculature at the skull base. Skull: Intact Sinuses/Orbits: Mild mucosal thickening within the visualized right maxillary sinus. Remaining paranasal sinuses are clear. Orbits are unremarkable. Other: Mastoid air cells and middle ear cavities are clear. CT CERVICAL SPINE FINDINGS Alignment: Normal. Skull base and vertebrae: Craniocervical alignment is normal. The atlantodental interval is not widened. No acute fracture of the cervical spine. Vertebral  body height is preserved. Soft tissues and spinal canal: No prevertebral fluid or swelling. No visible canal hematoma. No cervical adenopathy. Mild atherosclerotic calcification within the carotid bifurcations. 13 mm nodule within the left thyroid gland, not well characterized on this examination. Not clinically significant; no follow-up imaging recommended (ref: J Am Coll Radiol. 2015 Feb;12(2): 143-50). Disc levels: There is intervertebral disc space narrowing, disc calcification, and endplate remodeling throughout the cervical spine in keeping with changes of diffuse advanced degenerative disc disease. The prevertebral soft tissues are not thickened. The spinal canal is widely patent. Multilevel uncovertebral and facet arthrosis results in multilevel mild neuroforaminal narrowing, most severe on the right at C3-4 and bilaterally at C5-6. Upper chest: Unremarkable Other: None CT ABDOMEN AND PELVIS FINDINGS Lower chest: Lobulated, spiculated nodule within the a right lower lobe has significantly enlarged in the interval since prior examination, now measuring 17 mm x 22 mm at axial image # 7/4. There is an internal air bronchograms noted, best appreciated on image # 4/4. Given its rate of growth as well as lack of significant mass effect upon the adjacent airway favors an inflammatory or lymphoproliferative etiology, as can be seen with sarcoidosis, or pulmonary lymphoma, however, a metastatic focus is not completely excluded. Interval development of a elongate 9 x 5 mm nodule within the  left lower lobe, axial image # 12/4. No pleural effusion. Cardiac size is within normal limits. Hepatobiliary: No focal liver abnormality is seen. Status post cholecystectomy. No biliary dilatation. Pancreas: Unremarkable Spleen: Unremarkable Adrenals/Urinary Tract: The adrenal glands are unremarkable. A a solid, rounded mass is again identified within the right kidney anteriorly measuring 5.5 x 7.8 x 7.3 cm in greatest dimension,  grossly stable since prior exam, in keeping with the patient's known primary renal cell carcinoma. Left kidney is unremarkable on this noncontrast examination. No hydronephrosis. The bladder is unremarkable. 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 Calcified nodules adjacent to the appendix are unchanged from prior examination, possibly representing the sequela of remote trauma or inflammation. The stomach, small bowel, and large bowel are otherwise unremarkable. No evidence of obstruction or focal inflammation. No free intraperitoneal gas or fluid. Vascular/Lymphatic: Right perirenal adenopathy is again identified and appears stable to improved since prior examination. The index lymph node measures 20 x 22 mm at axial image # 26/2 previously measuring 26 x 23 mm. Remaining lymph nodes appears stable. No new pathologic adenopathy within the abdomen and pelvis. 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 The pelvic organs are otherwise unremarkable. Other: Diastasis of the rectus abdominus musculature is again noted without frank hernia formation. Rectum unremarkable Musculoskeletal: No acute bone abnormality. No lytic or blastic bone lesion. IMPRESSION: 1. No acute intracranial abnormality. No calvarial fracture. 2. No acute fracture or listhesis of the cervical spine. 3. No acute intra-abdominal pathology identified. 4. Stable 7.8 cm right renal mass in keeping with the patient's known primary renal cell carcinoma. Stable to slightly improved right perirenal adenopathy. 5. Interval increase in size of a right lower lobe pulmonary nodule and development of a smaller left lower lobe pulmonary nodule. While these findings may be infectious or inflammatory in nature, the possibility of metastatic disease is not excluded. Nonemergent dedicated CT imaging of the chest or PET CT examination may be helpful for further evaluation. Electronically Signed   By: Fidela Salisbury  M.D.   On: 03/22/2022 21:49   CT ABDOMEN PELVIS WO CONTRAST  Result Date: 03/22/2022 CLINICAL DATA:  Abdominal pain, acute, nonlocalized; Head trauma, minor (Age >= 65y); Neck trauma (Age >= 65y) EXAM: CT HEAD WITHOUT CONTRAST CT CERVICAL SPINE WITHOUT CONTRAST CT ABDOMEN AND PELVIS WITHOUT CONTRAST TECHNIQUE: Contiguous axial images were obtained from the base of the skull through the vertex without intravenous contrast. Multidetector CT imaging of the cervical spine was performed without intravenous contrast. Multiplanar CT image reconstructions were also generated. Multidetector CT imaging of the abdomen and pelvis was performed following the standard protocol without IV contrast. RADIATION DOSE REDUCTION: This exam was performed according to the departmental dose-optimization program which includes automated exposure control, adjustment of the mA and/or kV according to patient size and/or use of iterative reconstruction technique. COMPARISON:  CT chest abdomen pelvis 01/19/2022, CT head/C-spine 09/09/2021 FINDINGS: CT HEAD FINDINGS Brain: Normal anatomic configuration. Parenchymal volume loss is commensurate with the patient's age. Mild periventricular white matter changes are present likely reflecting the sequela of small vessel ischemia. No abnormal intra or extra-axial mass lesion or fluid collection. No abnormal mass effect or midline shift. No evidence of acute intracranial hemorrhage or infarct. Ventricular size is normal. Cerebellum unremarkable. Vascular: No asymmetric hyperdense vasculature at the skull base. Skull: Intact Sinuses/Orbits: Mild mucosal thickening within the visualized right maxillary sinus. Remaining paranasal sinuses are clear. Orbits are unremarkable. Other: Mastoid air cells and middle ear cavities are  clear. CT CERVICAL SPINE FINDINGS Alignment: Normal. Skull base and vertebrae: Craniocervical alignment is normal. The atlantodental interval is not widened. No acute fracture of  the cervical spine. Vertebral body height is preserved. Soft tissues and spinal canal: No prevertebral fluid or swelling. No visible canal hematoma. No cervical adenopathy. Mild atherosclerotic calcification within the carotid bifurcations. 13 mm nodule within the left thyroid gland, not well characterized on this examination. Not clinically significant; no follow-up imaging recommended (ref: J Am Coll Radiol. 2015 Feb;12(2): 143-50). Disc levels: There is intervertebral disc space narrowing, disc calcification, and endplate remodeling throughout the cervical spine in keeping with changes of diffuse advanced degenerative disc disease. The prevertebral soft tissues are not thickened. The spinal canal is widely patent. Multilevel uncovertebral and facet arthrosis results in multilevel mild neuroforaminal narrowing, most severe on the right at C3-4 and bilaterally at C5-6. Upper chest: Unremarkable Other: None CT ABDOMEN AND PELVIS FINDINGS Lower chest: Lobulated, spiculated nodule within the a right lower lobe has significantly enlarged in the interval since prior examination, now measuring 17 mm x 22 mm at axial image # 7/4. There is an internal air bronchograms noted, best appreciated on image # 4/4. Given its rate of growth as well as lack of significant mass effect upon the adjacent airway favors an inflammatory or lymphoproliferative etiology, as can be seen with sarcoidosis, or pulmonary lymphoma, however, a metastatic focus is not completely excluded. Interval development of a elongate 9 x 5 mm nodule within the left lower lobe, axial image # 12/4. No pleural effusion. Cardiac size is within normal limits. Hepatobiliary: No focal liver abnormality is seen. Status post cholecystectomy. No biliary dilatation. Pancreas: Unremarkable Spleen: Unremarkable Adrenals/Urinary Tract: The adrenal glands are unremarkable. A a solid, rounded mass is again identified within the right kidney anteriorly measuring 5.5 x 7.8 x  7.3 cm in greatest dimension, grossly stable since prior exam, in keeping with the patient's known primary renal cell carcinoma. Left kidney is unremarkable on this noncontrast examination. No hydronephrosis. The bladder is unremarkable. 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 Calcified nodules adjacent to the appendix are unchanged from prior examination, possibly representing the sequela of remote trauma or inflammation. The stomach, small bowel, and large bowel are otherwise unremarkable. No evidence of obstruction or focal inflammation. No free intraperitoneal gas or fluid. Vascular/Lymphatic: Right perirenal adenopathy is again identified and appears stable to improved since prior examination. The index lymph node measures 20 x 22 mm at axial image # 26/2 previously measuring 26 x 23 mm. Remaining lymph nodes appears stable. No new pathologic adenopathy within the abdomen and pelvis. 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 The pelvic organs are otherwise unremarkable. Other: Diastasis of the rectus abdominus musculature is again noted without frank hernia formation. Rectum unremarkable Musculoskeletal: No acute bone abnormality. No lytic or blastic bone lesion. IMPRESSION: 1. No acute intracranial abnormality. No calvarial fracture. 2. No acute fracture or listhesis of the cervical spine. 3. No acute intra-abdominal pathology identified. 4. Stable 7.8 cm right renal mass in keeping with the patient's known primary renal cell carcinoma. Stable to slightly improved right perirenal adenopathy. 5. Interval increase in size of a right lower lobe pulmonary nodule and development of a smaller left lower lobe pulmonary nodule. While these findings may be infectious or inflammatory in nature, the possibility of metastatic disease is not excluded. Nonemergent dedicated CT imaging of the chest or PET CT examination may be helpful for further evaluation.  Electronically Signed  By: Fidela Salisbury M.D.   On: 03/22/2022 21:49   DG Chest Port 1 View  Result Date: 03/22/2022 CLINICAL DATA:  Altered mental status EXAM: PORTABLE CHEST 1 VIEW COMPARISON:  09/09/2021, CT 01/19/2022 FINDINGS: Cardiomegaly with aortic atherosclerosis. No acute airspace disease, effusion or pneumothorax. Postsurgical changes in the epigastric area IMPRESSION: No active disease.  Cardiomegaly Electronically Signed   By: Donavan Foil M.D.   On: 03/22/2022 20:50    Microbiology: Recent Results (from the past 240 hour(s))  SARS Coronavirus 2 by RT PCR (hospital order, performed in [facility] hospital lab) *cepheid single result test* Anterior Nasal Swab     Status: None   Collection Time: 03/22/22  8:28 PM   Specimen: Anterior Nasal Swab  Result Value Ref Range Status   SARS Coronavirus 2 by RT PCR NEGATIVE NEGATIVE Final    Comment: (NOTE) SARS-CoV-2 target nucleic acids are NOT DETECTED.  The SARS-CoV-2 RNA is generally detectable in upper and lower respiratory specimens during the acute phase of infection. The lowest concentration of SARS-CoV-2 viral copies this assay can detect is 250 copies / mL. A negative result does not preclude SARS-CoV-2 infection and should not be used as the sole basis for treatment or other patient management decisions.  A negative result may occur with improper specimen collection / handling, submission of specimen other than nasopharyngeal swab, presence of viral mutation(s) within the areas targeted by this assay, and inadequate number of viral copies (<250 copies / mL). A negative result must be combined with clinical observations, patient history, and epidemiological information.  Fact Sheet for Patients:   https://www.patel.info/  Fact Sheet for Healthcare Providers: https://hall.com/  This test is not yet approved or  cleared by the [place] FDA and has been authorized for  detection and/or diagnosis of SARS-CoV-2 by FDA under an Emergency Use Authorization (EUA).  This EUA will remain in effect (meaning this test can be used) for the duration of the COVID-19 declaration under Section 564(b)(1) of the Act, 21 U.S.C. section 360bbb-3(b)(1), unless the authorization is terminated or revoked sooner.  Performed at [facility], [address], [place], [place] [address]   Urine Culture     Status: Abnormal   Collection Time: 03/23/22 10:47 AM   Specimen: Urine, Clean Catch  Result Value Ref Range Status   Specimen Description   Final    URINE, CLEAN CATCH Performed at [facility], [address] [place], [place] [address]    Special Requests   Final    NONE Performed at [facility], [address], [place], [place] [address]    Culture (A)  Final    <10,000 COLONIES/mL INSIGNIFICANT GROWTH Performed at [facility] [address], [place], [place] [address]    Report Status 03/24/2022 FINAL  Final     Labs: Basic Metabolic Panel: Recent Labs  Lab 03/22/22 2005 03/23/22 0416 03/25/22 0512  NA 135 139 138  K 3.7 3.3* 3.8  CL 102 105 105  CO2 '26 29 30  '$ GLUCOSE 302* 170* 123*  BUN '23 16 18  '$ CREATININE 0.59 0.48 0.57  CALCIUM 10.3 9.9 10.1   Liver Function Tests: Recent Labs  Lab 03/22/22 2020 03/24/22 1331  AST 17 19  ALT 12 11  ALKPHOS 131* 94  BILITOT 0.7 0.6  PROT 6.8 6.1*  ALBUMIN 3.2* 2.9*   No results for input(s): "LIPASE", "AMYLASE" in the last 168 hours. Recent Labs  Lab 03/23/22 0013  AMMONIA  25   CBC: Recent Labs  Lab 03/22/22 2005 03/23/22 0416  WBC 9.7 7.9  HGB 13.7 13.6  HCT 43.9 42.8  MCV 81.8 82.5  PLT 224 202   Cardiac Enzymes: No results for input(s): "CKTOTAL", "CKMB", "CKMBINDEX", "TROPONINI" in the last 168 hours. BNP: BNP (last 3 results) Recent Labs    07/23/21 1148 09/09/21 1137 03/22/22 2028  BNP 164.6* 119.6* 182.6*    ProBNP (last 3  results) No results for input(s): "PROBNP" in the last 8760 hours.  CBG: Recent Labs  Lab 03/25/22 1151 03/25/22 1649 03/25/22 2038 03/26/22 0740 03/26/22 1136  GLUCAP 189* 231* 161* 82 199*       Signed:  [name] MD.  [facility] 03/26/2022, 2:51 PM

## 2022-04-01 ENCOUNTER — Other Ambulatory Visit (HOSPITAL_COMMUNITY): Payer: Self-pay

## 2022-04-02 ENCOUNTER — Non-Acute Institutional Stay: Payer: Medicare PPO | Admitting: Nurse Practitioner

## 2022-04-02 ENCOUNTER — Encounter: Payer: Self-pay | Admitting: Nurse Practitioner

## 2022-04-02 VITALS — BP 117/67 | HR 67 | Temp 97.9°F | Resp 18 | Wt 155.0 lb

## 2022-04-02 DIAGNOSIS — Z515 Encounter for palliative care: Secondary | ICD-10-CM

## 2022-04-02 DIAGNOSIS — R531 Weakness: Secondary | ICD-10-CM

## 2022-04-02 DIAGNOSIS — R5381 Other malaise: Secondary | ICD-10-CM

## 2022-04-02 NOTE — Progress Notes (Signed)
[place] Consult Note Telephone: [phone]  Fax: [phone]   Date of encounter: 04/02/22 6:53 PM PATIENT NAME: Barbara Frank [address] [place] [address]   [phone] (home)  DOB: 1945/11/12 MRN: [id number] PRIMARY CARE PROVIDER:    [facility]  RESPONSIBLE PARTY:    Contact Information     Name Relation Home Work Mobile   [place] Other   [phone]      I met face to face with patient in facility. Palliative Care was asked to follow this patient by consultation request of  [place] to address advance care planning and complex medical decision making. This is the initial visit.                   ASSESSMENT AND PLAN / RECOMMENDATIONS:  Symptom Management/Plan: 1. Advance Care Planning;  DNR; wishes to pursue treatment with Oncology  2. Goals of Care: Goals include to maximize quality of life and symptom management. Our advance care planning conversation included a discussion about:    The value and importance of advance care planning  Exploration of personal, cultural or spiritual beliefs that might influence medical decisions  Exploration of goals of care in the event of a sudden injury or illness  Identification and preparation of a healthcare agent  Review and updating or creation of an advance directive document.  3. Generalized weakness/Debility progressive secondary to renal cell carcinoma with disease progression and uncontrolled DM with HAIC 11.1; on insulin therapy. Fall risk  4. Palliative care encounter; Palliative care encounter; Palliative medicine team will continue to support patient, patient's family, and medical team. Visit consisted of counseling and education dealing with the complex and emotionally intense issues of symptom management and palliative care in the setting of serious and potentially life-threatening illness  Follow up Palliative  Care Visit: Palliative care will continue to follow for complex medical decision making, advance care planning, and clarification of goals. Return 2 weeks or prn.  I spent 46 minutes providing this consultation starting at 9:45 am. More than 50% of the time in this consultation was spent in counseling and care coordination. PPS: 50% Chief Complaint: Initial palliative consult for complex medical decision making, address goals, manage ongoing symptoms  HISTORY OF PRESENT ILLNESS:  Barbara Frank is a 76 y.o. year old female  with multiple medical problems including renal cell carcinoma, atrial fibrillation or eliquis, type 2 diabetes, depression. Barbara Frank was residing at [facility]; able to complete ADL's, use walker for ambulation. Palliative was following at [facility]. She had a hospitalization from 03/22/2022 to 03/26/2022 for altered mental status. Workup significant for CT abdomen/pelvis/chest showing pulmonary nodules likely metastatic disease with existing renal mass advancing malignancy; delirium; Previously started on oral inlyta for renal cell carcinoma with putting inlyta on hold as HCPOA felt like cognition worsen following initiation of inlyta. Barbara Frank was d/c to [facility] for [facility], staff endorses ambulates with walker, slowly. Requires assistance for ADL's. Barbara Frank does feed herself with declined appetite. Barbara Frank is a DNR. At present Barbara Frank is walking around her bed, talked about purpose of pc which she was familiar with. We talked about ros, symptoms, functional abilities, hospitalization, briefly talked about Oncology visit, will f/u with Healthcare power of attorney, Mr. [name], though he is not listed as RP on [facility] demographics. Barbara Frank is listed as primary and following her niece [name], will need to re-visit.  Medical goals reviewed. Updated staff, will continue to follow, Therapeutic listening, emotional support provided. Will monitor  close. Watch weights, nutrition.   History obtained from review of EMR, discussion with primary team, and interview with family, facility staff/caregiver and/or Barbara Frank.  I reviewed available labs, medications, imaging, studies and related documents from the EMR.  Records reviewed and summarized above.   ROS 10 point system reviewed all negative except HPI  Physical Exam: Constitutional: NAD General: frail appearing, pleasant female EYES:  lids intact ENMT: oral mucous membranes moist CV: S1S2, RRR Pulmonary: LCTA, no increased work of breathing, no cough, room air Abdomen:  normo-active BS + 4 quadrants, soft and non tender MSK: ambulatory with walker Skin: warm and dry Neuro:  + generalized weakness,  + cognitive impairment Psych: non-anxious affect, A and O CURRENT PROBLEM LIST:  Patient Active Problem List   Diagnosis Date Noted   Altered mental status 03/22/2022   Noncompliance 01/29/2022   A-fib ([place]) 09/09/2021   Constipation    Generalized weakness    Fall at home, initial encounter 07/23/2021   Lung nodule 07/23/2021   Elevated lactic acid level 07/23/2021   Prolonged QT interval 07/23/2021   Atrial fibrillation, chronic ([place])    Weakness    Palliative care encounter    Acute cystitis with hematuria    Type 2 diabetes mellitus without complication, with long-term current use of insulin (HCC)    Increased ammonia level    Encephalopathy 03/07/2021   Transient confusion 03/06/2021   Intertrigo 03/06/2021   Encounter for antineoplastic chemotherapy 01/08/2021   Encounter for antineoplastic immunotherapy 01/08/2021   Goals of care, counseling/discussion 10/17/2020   Hypercalcemia 10/17/2020   Chest mass 10/17/2020   Atrial fibrillation with RVR ([place]) 09/26/2020   Depression 09/26/2020   Diabetes type 2, uncontrolled 09/26/2020   Hypokalemia 09/26/2020   Renal cell carcinoma ([place]) 09/26/2020   Acute delirium 09/26/2020   Ureteral stone 09/26/2020   PAST  MEDICAL HISTORY:  Active Ambulatory Problems    Diagnosis Date Noted   Atrial fibrillation with RVR ([place]) 09/26/2020   Depression 09/26/2020   Diabetes type 2, uncontrolled 09/26/2020   Hypokalemia 09/26/2020   Renal cell carcinoma ([place]) 09/26/2020   Acute delirium 09/26/2020   Ureteral stone 09/26/2020   Goals of care, counseling/discussion 10/17/2020   Hypercalcemia 10/17/2020   Chest mass 10/17/2020   Encounter for antineoplastic chemotherapy 01/08/2021   Encounter for antineoplastic immunotherapy 01/08/2021   Transient confusion 03/06/2021   Intertrigo 03/06/2021   Encephalopathy 03/07/2021   Acute cystitis with hematuria    Type 2 diabetes mellitus without complication, with long-term current use of insulin (HCC)    Increased ammonia level    Weakness    Palliative care encounter    Atrial fibrillation, chronic ([place])    Fall at home, initial encounter 07/23/2021   Lung nodule 07/23/2021   Elevated lactic acid level 07/23/2021   Prolonged QT interval 07/23/2021   Generalized weakness    Constipation    A-fib ([place]) 09/09/2021   Noncompliance 01/29/2022   Altered mental status 03/22/2022   Resolved Ambulatory Problems    Diagnosis Date Noted   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   Past Medical History:  Diagnosis Date   Diabetes mellitus without complication ([place])    History of kidney stones    Kidney mass    SOCIAL HX:  Social History   Tobacco Use   Smoking status: Never   Smokeless tobacco: Never  Substance Use Topics   Alcohol  use: Not Currently   FAMILY HX:  Family History  Problem Relation Age of Onset   Breast cancer Mother    Bladder Cancer Neg Hx    Kidney cancer Neg Hx    Prostate cancer Neg Hx       ALLERGIES:  Allergies  Allergen Reactions   Kiwi Extract      PERTINENT MEDICATIONS:  Outpatient Encounter Medications as of 04/02/2022  Medication Sig   apixaban (ELIQUIS) 5 MG TABS tablet Take 1 tablet (5 mg total) by mouth 2 (two)  times daily.   cetirizine (ZYRTEC) 10 MG tablet Take 0.5 tablets (5 mg total) by mouth daily.   cyanocobalamin 1000 MCG tablet Take 1,000 mcg by mouth daily.   diltiazem (CARDIZEM CD) 240 MG 24 hr capsule Take 1 capsule (240 mg total) by mouth every evening.   escitalopram (LEXAPRO) 10 MG tablet Take 10 mg by mouth daily.   insulin aspart (NOVOLOG) 100 UNIT/ML injection Inject 0-15 Units into the skin 3 (three) times daily with meals. CBG 70 - 120: 0 units,  121 - 150: 2 units, 151 - 200: 3 units,  201 - 250: 5 units,  251 - 300: 8 units, 301 - 350: 11 units 351 - 400: 15 units, CBG > 400: call MD and obtain STAT lab verification (Patient not taking: Reported on 03/16/2022)   insulin aspart (NOVOLOG) 100 UNIT/ML injection Inject 4 Units into the skin 3 (three) times daily with meals. (Patient not taking: Reported on 03/16/2022)   insulin glargine (LANTUS) 100 UNIT/ML injection Inject 0.25 mLs (25 Units total) into the skin 2 (two) times daily.   metFORMIN (GLUCOPHAGE-XR) 500 MG 24 hr tablet Take 1 tablet (500 mg total) by mouth 2 (two) times daily. (Patient taking differently: Take 1,000 mg by mouth daily.)   metoprolol succinate (TOPROL-XL) 25 MG 24 hr tablet Take 25 mg by mouth daily.   No facility-administered encounter medications on file as of 04/02/2022.   Thank you for the opportunity to participate in the care of Barbara Frank.  The palliative care team will continue to follow. Please call our office at [phone] if we can be of additional assistance.   Yanelle Sousa Z Eligh Rybacki, NP ,

## 2022-04-06 ENCOUNTER — Encounter: Payer: Self-pay | Admitting: Oncology

## 2022-04-06 ENCOUNTER — Inpatient Hospital Stay (HOSPITAL_BASED_OUTPATIENT_CLINIC_OR_DEPARTMENT_OTHER): Payer: Medicare PPO | Admitting: Oncology

## 2022-04-06 ENCOUNTER — Inpatient Hospital Stay: Payer: Medicare PPO | Attending: Oncology

## 2022-04-06 VITALS — BP 129/66 | HR 70 | Temp 98.2°F | Resp 18 | Wt 159.5 lb

## 2022-04-06 DIAGNOSIS — Z5111 Encounter for antineoplastic chemotherapy: Secondary | ICD-10-CM

## 2022-04-06 DIAGNOSIS — C641 Malignant neoplasm of right kidney, except renal pelvis: Secondary | ICD-10-CM

## 2022-04-06 DIAGNOSIS — Z794 Long term (current) use of insulin: Secondary | ICD-10-CM | POA: Insufficient documentation

## 2022-04-06 DIAGNOSIS — E119 Type 2 diabetes mellitus without complications: Secondary | ICD-10-CM | POA: Diagnosis not present

## 2022-04-06 DIAGNOSIS — R918 Other nonspecific abnormal finding of lung field: Secondary | ICD-10-CM | POA: Diagnosis not present

## 2022-04-06 DIAGNOSIS — Z79899 Other long term (current) drug therapy: Secondary | ICD-10-CM | POA: Insufficient documentation

## 2022-04-06 DIAGNOSIS — G9340 Encephalopathy, unspecified: Secondary | ICD-10-CM

## 2022-04-06 LAB — CBC WITH DIFFERENTIAL/PLATELET
Abs Immature Granulocytes: 0.03 10*3/uL (ref 0.00–0.07)
Basophils Absolute: 0 10*3/uL (ref 0.0–0.1)
Basophils Relative: 1 %
Eosinophils Absolute: 0.1 10*3/uL (ref 0.0–0.5)
Eosinophils Relative: 3 %
HCT: 40.4 % (ref 36.0–46.0)
Hemoglobin: 12.4 g/dL (ref 12.0–15.0)
Immature Granulocytes: 1 %
Lymphocytes Relative: 26 %
Lymphs Abs: 1.4 10*3/uL (ref 0.7–4.0)
MCH: 26.5 pg (ref 26.0–34.0)
MCHC: 30.7 g/dL (ref 30.0–36.0)
MCV: 86.3 fL (ref 80.0–100.0)
Monocytes Absolute: 0.4 10*3/uL (ref 0.1–1.0)
Monocytes Relative: 7 %
Neutro Abs: 3.5 10*3/uL (ref 1.7–7.7)
Neutrophils Relative %: 62 %
Platelets: 326 10*3/uL (ref 150–400)
RBC: 4.68 MIL/uL (ref 3.87–5.11)
RDW: 13.9 % (ref 11.5–15.5)
WBC: 5.5 10*3/uL (ref 4.0–10.5)
nRBC: 0 % (ref 0.0–0.2)

## 2022-04-06 LAB — COMPREHENSIVE METABOLIC PANEL
ALT: 10 U/L (ref 0–44)
AST: 17 U/L (ref 15–41)
Albumin: 2.9 g/dL — ABNORMAL LOW (ref 3.5–5.0)
Alkaline Phosphatase: 84 U/L (ref 38–126)
Anion gap: 6 (ref 5–15)
BUN: 19 mg/dL (ref 8–23)
CO2: 27 mmol/L (ref 22–32)
Calcium: 9.9 mg/dL (ref 8.9–10.3)
Chloride: 106 mmol/L (ref 98–111)
Creatinine, Ser: 0.59 mg/dL (ref 0.44–1.00)
GFR, Estimated: >60 mL/min (ref >60)
Glucose, Bld: 139 mg/dL — ABNORMAL HIGH (ref 70–99)
Potassium: 4.2 mmol/L (ref 3.5–5.1)
Sodium: 139 mmol/L (ref 135–145)
Total Bilirubin: 0.4 mg/dL (ref 0.3–1.2)
Total Protein: 6 g/dL — ABNORMAL LOW (ref 6.5–8.1)

## 2022-04-06 NOTE — Progress Notes (Signed)
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 We will check insurance coverage for cabozantinib, will start with reduced dose 40 mg daily.  Encounter for antineoplastic chemotherapy Treatment plan as listed above  Encephalopathy Improved, not yet at her baseline. Recommend patient to follow-up in 2 to 3 weeks for reevaluation prior to starting on new medication.  Orders Placed This Encounter  Procedures   Lactate dehydrogenase    Standing Status:   Future    Standing Expiration Date:   04/07/2023   Follow-up in 3 weeks All questions were answered. The patient knows to call the clinic with any problems, questions or concerns.  ZEarlie Server MD, PhD [facility] Oncology 04/06/2022   CHIEF COMPLAINTS/REASON FOR VISIT:  Metastatic RCC  HISTORY OF PRESENTING ILLNESS:   Patient is a poor historian. Her mood appears to be depressed.  She currently lives at assisted living facility. [place]  Her power of attorney is JDrucie Ipwho is her husband's niece. JAlmyra Freewas called during this encounter.   Extensive medical record review was performed. She has had multiple images done during the past few months, 05/27/20 CT head wo contrast- no acute intracranial abnormality.  06/01/20 CT abdomen and pelvis with contrast 4 mm proximal left ureteral calculus causing mild proximal left- sided hydroureteronephrosis with periureteric and perinephric stranding on the left.5.2cm enhancing right mid  anterior kidney mass. Mildly enlarged right retroperitoneal retrocaval lymph nodes suspicious for nodal metastases Mild cardiomegaly, Small hiatal hernia. Status post gastric bypass surgery and cholecystectomy. Postoperative changes anterior abdominal wall. 06/05/20 CT head wo contrast - No acute intracranial abnormality. Probable tiny right frontal scalp Contusion. 2. Mild global volume loss and minimal chronic ischemic small vessel disease. 06/06/20 CT abdomen pelvis wo contrast 1. Indeterminate lesion in the right kidney which is stable may be a solid mass., 2. Partially calcified uterine fibroids. 3. Left UVJ calculus measuring 5 mm 06/07/20 CT head wo contrast.  1. No acute intracranial abnormality. No significant change. 2. Intracranial atherosclerosis, atrophy and chronic small vessel ischemic changes. 3. Chronic/subacute right maxillary sinus disease.  06/07/20 Cystoscopy by DEverardo BealsAArnell Sievingfor Ureteroscopic stone extraction Admitted on 10/3/21due to failure to thrive,  anorexia, dehydration, electrolyte imbalances.   06/09/20 CT abdomen and chest with contrast 1. Right renal mass is stable in size. Most suggestive of a renal neoplasm. 2. Right retroperitoneal lymph nodes are stable in size; may well be metastatic. 3. Left hydronephrosis and hydroureter is stable. Pelvis is not imaged therefore if there is a distal calculus still present, it is not visible. 4. Sclerotic lesions in the anterior left seventh, eighth and ninth ribs which given distribution are most likely healed rib fractures.  She is on Eliquis 559mBid for atrial fibrillation. She reports not taking anti depressants that were given by her PCP.  #11/04/2020, PET scan was obtained. PET scan showed interval increasing size of right upper lobe pulmonary lesion, now 3 cm with an SUV of 3.8.  Associated with a new right lower lobe nodule 8 mm.  5.1  cm right renal mass hypermetabolic activity.  1.9 retrocaval lymph node with SUV of  4.8.  Second index retrocaval node with SUV of 2.  Nodular disease seen in the duodenum on previous study is not well demonstrated on noncontrast CT image today.  Some nodular FDG activity, nonspecific. Multiple anterior abdominal soft tissue nodules are evident.  13 mm anterior left nodule shows no substantial hypermetabolic with an SUV of 1.7.  Calcified left lateral nodule with an SUV of 2.9.  2.2 cm calcified nodule lobe right paracolic gutter with an SUV of 2.5.  12/17/2020, right kidney mass biopsy pathology was reviewed and discussed with patient.  Pathology is consistent with RCC-demonstrates the morphology spectrum, papillary, solid and nested areas.  CD10 positive, racemase positive, CK7 negative.  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  TFE 3 break apart FISH testing is pending.  #01/05/2021, addendum pathology TFE3 break-apart FISH testing was performed and a translocation  involving Xp11 / TFE3 transcription gene was not detected. HMB-45 IHC is negative  CAIX IHC demonstrates membranous staining.  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  MRI brain was obtained during the admission and was negative for acute process.  Patient mental status improved.  UTI was treated with IV antibiotics and switched to p.o. antibiotic upon discharge.  Patient also was found to have rapid ventricular response with her atrial fibrillation.  Rate was treated with Cardizem drip during hospitalization because and switched to Toprol and Cardizem for rate control.  Eliquis for anticoagulation. Patient's axitinib was held during hospitalization.  There was plan for patient to restart axitinib  upon discharge.  However since patient was sent to rehab, he is not able to be started on axitinib due to lack of access to her medication supply.  [facility]  Patient presents for post hospitalization follow-up.  She does not have much recollection of her recent admission.  Reports feeling well today.  He denies any fever, chills, nausea vomiting diarrhea.  03/07/2021, CT abdomen pelvis  Right renal mass slightly enlarged since 10/03/2020.  5.4 x 4.9 x 6.4 comparing to previously 5.0 x 4.4 x 6.3.  Stable adjacent right retroperitoneal lymphadenopathy.  There is a new 1.5 cm nodule in the right lower lobe.  Mild stranding around the right renal mass.  04/01/2022 resumed Axitinib [id number] 05/06/2022 she presented for follow up, she stopped " all her medications". Tachycardia, hyperglycemia, she declined ER evaluation.  06/03/2022, presented for follow up.  Multiple no show to her CT scan. Palliative care service has not been able to reach her  06/23/2021, CT chest abdomen pelvis showed stable large right anterior renal mass.  Stable to slightly larger right-sided retroperitoneal adenopathy.  Waxing and waning pulmonary nodules.  2 lower lobe nodules resolved.  New right middle lobe and right upper lobe nodules.  06/29/2022  Hold chemotherapy due to hyperglycemia.  Blood sugar was 431, due to noncompliance.  Recommend patient to go to emergency room for evaluation and she declines. talked to patient's PCP TracOlivia Mackier the phone and discussed patient's case with her. Patient is not complaint with her medication. Her diabetes was poorly controlled and glucose  was extremely high, she refused to go to ER for management. She is also extremely depressed. Most recent CT scan shows stable disease. We both agree that patient is not able to take care of herself.  Olivia Mackie will try to get her to see psychiatrist for evaluation of her depression,she may benefit  from living in a facility as well. From Oncology aspect, her prognosis is poor, and I will hold off additional treatment until her other acute medical conditions are stabilized. Hospice/palliative care is reasonable if her condition continues to deteriorate. Olivia Mackie will update me if she improves. .   07/23/2021 - 07/31/2021, hospitalized due to A. fib with RVR. 09/09/2021 - 09/14/2021, the patient due to A. fib with RVR, uncontrolled diabetes with glucose of 500s, DKA. 09/09/2021, CT angio chest PE protocol showed interval decrease of the right upper lobe nodularity.  No PE.  10/09/2021 , re-establish care, she expressed interests of resuming RCC treatments. She agrees with getting CT restaging. She reports being complaint with her medication.   10/28/21 CT showed progression of right renal mass, retroperitoneal lymphadenopathy. Stable small lung nodules. Cardiomegaly with biatrial dilatation. Aortic atherosclerosis. calcifications of the aortic valve, Dilatation of the pulmonic trunk   01/20/2022, CT chest abdomen pelvis with contrast showed heterogeneous mass of the anterior midportion of the right kidney is slightly increased in size, as is an adjacent right retroperitoneal lymph node.  Consistent with disease progression.  Findings are consistent with worsened renal cell carcinoma and adjacent nodal metastatic disease. New pulmonary nodule of the superior segment right lower lobe measuring 0.7 cm, highly suspicious for a small metastasis. Multiple additional tiny pulmonary nodules are unchanged and likely benign and incidental. Attention on follow-up. Coronary artery disease.  03/08/2022-03/22/22   axitinib 5 mg twice daily. INTERVAL HISTORY Barbara Frank is a 76 y.o. female who has above history reviewed by me today presents for follow up visit for management of metastatic renal clear-cell carcinoma Today she is accompanied by her friend [name]  03/22/2022-03/26/2022 patient was admitted due to altered mental  status, delirium.  Urine culture was negative.  MRI brain with contrast was negative for metastasis or stroke.  CT abdomen pelvis and chest showed pulmonary nodules and existing renal mass.  Patient was discharged to rehab. Due to concern of possible encephalopathy secondary to axitinib, axitinib was held.  Today patient's mental status is better. She did not remember her recent hospitalization.  Denies any pain She is a poor historian    Review of Systems  Constitutional:  Positive for fatigue. Negative for appetite change, chills and fever.  HENT:   Negative for hearing loss and voice change.   Eyes:  Negative for eye problems.  Respiratory:  Negative for chest tightness and cough.   Cardiovascular:  Negative for chest pain.  Gastrointestinal:  Negative for abdominal distention, abdominal pain, blood in stool, constipation and diarrhea.  Endocrine: Negative for hot flashes.  Genitourinary:  Negative for difficulty urinating and frequency.   Musculoskeletal:  Negative for arthralgias.  Skin:  Negative for itching and rash.  Neurological:  Negative for extremity weakness.  Hematological:  Negative for adenopathy.  Psychiatric/Behavioral:  Positive for depression. Negative for confusion.     MEDICAL HISTORY:  Past Medical History:  Diagnosis Date   A-fib ([place])    Depression    Diabetes mellitus without complication ([place])    History of kidney stones    Kidney mass     SURGICAL HISTORY: Past Surgical History:  Procedure Laterality Date  GASTRIC BYPASS     HERNIA REPAIR     TONSILLECTOMY     URETEROSCOPY WITH HOLMIUM LASER LITHOTRIPSY Left     SOCIAL HISTORY: Social History   Socioeconomic History   Marital status: Widowed    Spouse name: Not on file   Number of children: Not on file   Years of education: Not on file   Highest education level: Not on file  Occupational History   Not on file  Tobacco Use   Smoking status: Never   Smokeless tobacco: Never  Vaping  Use   Vaping Use: Never used  Substance and Sexual Activity   Alcohol use: Not Currently   Drug use: Not Currently   Sexual activity: Not Currently  Other Topics Concern   Not on file  Social History Narrative   Not on file   Social Determinants of Health   Financial Resource Strain: Not on file  Food Insecurity: Not on file  Transportation Needs: Not on file  Physical Activity: Not on file  Stress: Not on file  Social Connections: Not on file  Intimate Partner Violence: Not on file    FAMILY HISTORY: Family History  Problem Relation Age of Onset   Breast cancer Mother    Bladder Cancer Neg Hx    Kidney cancer Neg Hx    Prostate cancer Neg Hx     ALLERGIES:  is allergic to kiwi extract.  MEDICATIONS:  Current Outpatient Medications  Medication Sig Dispense Refill   apixaban (ELIQUIS) 5 MG TABS tablet Take 1 tablet (5 mg total) by mouth 2 (two) times daily. 60 tablet 2   cetirizine (ZYRTEC) 10 MG tablet Take 0.5 tablets (5 mg total) by mouth daily. 15 tablet 0   cyanocobalamin 1000 MCG tablet Take 1,000 mcg by mouth daily.     diltiazem (CARDIZEM CD) 240 MG 24 hr capsule Take 1 capsule (240 mg total) by mouth every evening. 30 capsule 2   escitalopram (LEXAPRO) 10 MG tablet Take 10 mg by mouth daily.     insulin aspart (NOVOLOG) 100 UNIT/ML injection Inject 0-15 Units into the skin 3 (three) times daily with meals. CBG 70 - 120: 0 units,  121 - 150: 2 units, 151 - 200: 3 units,  201 - 250: 5 units,  251 - 300: 8 units, 301 - 350: 11 units 351 - 400: 15 units, CBG > 400: call MD and obtain STAT lab verification 10 mL 11   insulin glargine (LANTUS) 100 UNIT/ML injection Inject 0.25 mLs (25 Units total) into the skin 2 (two) times daily. 10 mL 11   metoprolol succinate (TOPROL-XL) 25 MG 24 hr tablet Take 25 mg by mouth daily.     nystatin (MYCOSTATIN/NYSTOP) powder Apply 1 Application topically 2 (two) times daily. Apply to abdominal fold and groin in the mornings   Applyunder both breasts at night     polyethylene glycol (MIRALAX / GLYCOLAX) 17 g packet Take 17 g by mouth daily.     metFORMIN (GLUCOPHAGE-XR) 500 MG 24 hr tablet Take 1 tablet (500 mg total) by mouth 2 (two) times daily. (Patient taking differently: Take 1,000 mg by mouth daily.) 60 tablet 2   No current facility-administered medications for this visit.     PHYSICAL EXAMINATION: ECOG PERFORMANCE STATUS: 2 - Symptomatic, <50% confined to bed Vitals:   04/06/22 1343  BP: 129/66  Pulse: 70  Resp: 18  Temp: 98.2 F (36.8 C)   Filed Weights   04/06/22 1343  Weight:  159 lb 8 oz (72.3 kg)    Physical Exam Constitutional:      General: She is not in acute distress.    Comments: Frail elderly female, she ambulates with a walker.   HENT:     Head: Normocephalic and atraumatic.  Eyes:     General: No scleral icterus. Cardiovascular:     Rate and Rhythm: Tachycardia present. Rhythm irregular.     Heart sounds: Normal heart sounds.  Pulmonary:     Effort: Pulmonary effort is normal. No respiratory distress.     Breath sounds: No wheezing.  Abdominal:     General: Bowel sounds are normal. There is no distension.     Palpations: Abdomen is soft.  Musculoskeletal:        General: No deformity. Normal range of motion.     Cervical back: Normal range of motion and neck supple.  Skin:    General: Skin is warm and dry.     Findings: No erythema or rash.  Neurological:     Mental Status: She is alert and oriented to person, place, and time.     Cranial Nerves: No cranial nerve deficit.     Coordination: Coordination normal.  Psychiatric:     Comments: Depressed     LABORATORY DATA:  I have reviewed the data as listed    Latest Ref Rng & Units 04/06/2022    1:17 PM 03/23/2022    4:16 AM 03/22/2022    8:05 PM  CBC  WBC 4.0 - 10.5 K/uL 5.5  7.9  9.7   Hemoglobin 12.0 - 15.0 g/dL 12.4  13.6  13.7   Hematocrit 36.0 - 46.0 % 40.4  42.8  43.9   Platelets 150 - 400 K/uL 326   202  224       Latest Ref Rng & Units 04/06/2022    1:17 PM 03/25/2022    5:12 AM 03/24/2022    1:31 PM  CMP  Glucose 70 - 99 mg/dL 139  123    BUN 8 - 23 mg/dL 19  18    Creatinine 0.44 - 1.00 mg/dL 0.59  0.57    Sodium 135 - 145 mmol/L 139  138    Potassium 3.5 - 5.1 mmol/L 4.2  3.8    Chloride 98 - 111 mmol/L 106  105    CO2 22 - 32 mmol/L 27  30    Calcium 8.9 - 10.3 mg/dL 9.9  10.1    Total Protein 6.5 - 8.1 g/dL 6.0   6.1   Total Bilirubin 0.3 - 1.2 mg/dL 0.4   0.6   Alkaline Phos 38 - 126 U/L 84   94   AST 15 - 41 U/L 17   19   ALT 0 - 44 U/L 10   11        RADIOGRAPHIC STUDIES: I have personally reviewed the radiological images as listed and agreed with the findings in the report. MR BRAIN W WO CONTRAST  Result Date: 03/23/2022 CLINICAL DATA:  Renal cell carcinoma. Assessment for metastatic disease. EXAM: MRI HEAD WITHOUT AND WITH CONTRAST TECHNIQUE: Multiplanar, multiecho pulse sequences of the brain and surrounding structures were obtained without and with intravenous contrast. CONTRAST:  3m GADAVIST GADOBUTROL 1 MMOL/ML IV SOLN COMPARISON:  03/06/2021 brain MRI FINDINGS: Brain: No acute infarct, mass effect or extra-axial collection. No acute or chronic hemorrhage. There is multifocal hyperintense T2-weighted signal within the white matter. Parenchymal volume and CSF spaces are normal. Within that limitation,  there are no visible contrast-enhancing lesions. The midline structures are normal. Postcontrast images are severely motion degraded. Vascular: Major flow voids are preserved. Skull and upper cervical spine: Normal calvarium and skull base. Visualized upper cervical spine and soft tissues are normal. Sinuses/Orbits:No paranasal sinus fluid levels or advanced mucosal thickening. No mastoid or middle ear effusion. Normal orbits. IMPRESSION: 1. Severely motion degraded study without visible metastatic disease. 2. No acute intracranial abnormality. 3. Multifocal hyperintense  T2-weighted signal within the white matter, most consistent with chronic microvascular ischemia. Electronically Signed   By: Ulyses Jarred M.D.   On: 03/23/2022 00:18   CT Angio Chest Pulmonary Embolism (PE) W or WO Contrast  Result Date: 03/22/2022 CLINICAL DATA:  Altered mental status EXAM: CT ANGIOGRAPHY CHEST WITH CONTRAST TECHNIQUE: Multidetector CT imaging of the chest was performed using the standard protocol during bolus administration of intravenous contrast. Multiplanar CT image reconstructions and MIPs were obtained to evaluate the vascular anatomy. RADIATION DOSE REDUCTION: This exam was performed according to the departmental dose-optimization program which includes automated exposure control, adjustment of the mA and/or kV according to patient size and/or use of iterative reconstruction technique. CONTRAST:  7m OMNIPAQUE IOHEXOL 350 MG/ML SOLN COMPARISON:  CT 01/20/2019. 06/23/2021 CT FINDINGS: Cardiovascular: Satisfactory opacification of the pulmonary arteries to the segmental level. No evidence of pulmonary embolism. Mild cardiomegaly. No significant pericardial effusion. Mild aortic atherosclerosis without aneurysm. Enlarged pulmonary trunk up to 4.2 cm. Mediastinum/Nodes: Midline trachea. No thyroid mass. No suspicious lymph nodes. Esophagus within normal limits. Lungs/Pleura: No pleural effusion or pneumothorax. Increased right lower lobe irregular pulmonary nodule, previously 7 mm, now measuring 2 cm. Irregular pulmonary nodules in the right upper lobe measuring up to 9 mm, series 5, image 40. Upper Abdomen: Incompletely visualized large heterogeneous mass in the right kidney measures approximately 7.7 by 5.8 cm, previously 7.7 by 6.1 cm. Retroperitoneal lymph node measuring about 2.2 by 1.9 cm but incompletely visualized. Postsurgical changes of the stomach. Musculoskeletal: No acute osseous abnormality Review of the MIP images confirms the above findings. IMPRESSION: 1. Negative for  acute pulmonary embolus. 2. Increased irregular right lower lobe pulmonary nodule, concerning for metastatic disease versus lung cancer. There is also increased right upper lobe pulmonary nodules compared to prior 3. Incompletely visualized large heterogeneous right renal mass corresponding to history of renal cell carcinoma, grossly stable in size. Adjacent right retroperitoneal node. 4. Mild cardiomegaly. Enlarged pulmonary trunk suggesting arterial hypertension Aortic Atherosclerosis (ICD10-I70.0). Electronically Signed   By: KDonavan FoilM.D.   On: 03/22/2022 23:27   CT HEAD WO CONTRAST (5MM)  Result Date: 03/22/2022 CLINICAL DATA:  Abdominal pain, acute, nonlocalized; Head trauma, minor (Age >= 65y); Neck trauma (Age >= 65y) EXAM: CT HEAD WITHOUT CONTRAST CT CERVICAL SPINE WITHOUT CONTRAST CT ABDOMEN AND PELVIS WITHOUT CONTRAST TECHNIQUE: Contiguous axial images were obtained from the base of the skull through the vertex without intravenous contrast. Multidetector CT imaging of the cervical spine was performed without intravenous contrast. Multiplanar CT image reconstructions were also generated. Multidetector CT imaging of the abdomen and pelvis was performed following the standard protocol without IV contrast. RADIATION DOSE REDUCTION: This exam was performed according to the departmental dose-optimization program which includes automated exposure control, adjustment of the mA and/or kV according to patient size and/or use of iterative reconstruction technique. COMPARISON:  CT chest abdomen pelvis 01/19/2022, CT head/C-spine 09/09/2021 FINDINGS: CT HEAD FINDINGS Brain: Normal anatomic configuration. Parenchymal volume loss is commensurate with the patient's age. Mild periventricular white matter changes  are present likely reflecting the sequela of small vessel ischemia. No abnormal intra or extra-axial mass lesion or fluid collection. No abnormal mass effect or midline shift. No evidence of acute  intracranial hemorrhage or infarct. Ventricular size is normal. Cerebellum unremarkable. Vascular: No asymmetric hyperdense vasculature at the skull base. Skull: Intact Sinuses/Orbits: Mild mucosal thickening within the visualized right maxillary sinus. Remaining paranasal sinuses are clear. Orbits are unremarkable. Other: Mastoid air cells and middle ear cavities are clear. CT CERVICAL SPINE FINDINGS Alignment: Normal. Skull base and vertebrae: Craniocervical alignment is normal. The atlantodental interval is not widened. No acute fracture of the cervical spine. Vertebral body height is preserved. Soft tissues and spinal canal: No prevertebral fluid or swelling. No visible canal hematoma. No cervical adenopathy. Mild atherosclerotic calcification within the carotid bifurcations. 13 mm nodule within the left thyroid gland, not well characterized on this examination. Not clinically significant; no follow-up imaging recommended (ref: J Am Coll Radiol. 2015 Feb;12(2): 143-50). Disc levels: There is intervertebral disc space narrowing, disc calcification, and endplate remodeling throughout the cervical spine in keeping with changes of diffuse advanced degenerative disc disease. The prevertebral soft tissues are not thickened. The spinal canal is widely patent. Multilevel uncovertebral and facet arthrosis results in multilevel mild neuroforaminal narrowing, most severe on the right at C3-4 and bilaterally at C5-6. Upper chest: Unremarkable Other: None CT ABDOMEN AND PELVIS FINDINGS Lower chest: Lobulated, spiculated nodule within the a right lower lobe has significantly enlarged in the interval since prior examination, now measuring 17 mm x 22 mm at axial image # 7/4. There is an internal air bronchograms noted, best appreciated on image # 4/4. Given its rate of growth as well as lack of significant mass effect upon the adjacent airway favors an inflammatory or lymphoproliferative etiology, as can be seen with  sarcoidosis, or pulmonary lymphoma, however, a metastatic focus is not completely excluded. Interval development of a elongate 9 x 5 mm nodule within the left lower lobe, axial image # 12/4. No pleural effusion. Cardiac size is within normal limits. Hepatobiliary: No focal liver abnormality is seen. Status post cholecystectomy. No biliary dilatation. Pancreas: Unremarkable Spleen: Unremarkable Adrenals/Urinary Tract: The adrenal glands are unremarkable. 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 Left kidney is unremarkable on this noncontrast examination. No hydronephrosis. The bladder is unremarkable. 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 Calcified nodules adjacent to the appendix are unchanged from prior examination, possibly representing the sequela of remote trauma or inflammation. The stomach, small bowel, and large bowel are otherwise unremarkable. No evidence of obstruction or focal inflammation. No Frank intraperitoneal gas or fluid. Vascular/Lymphatic: Right perirenal adenopathy is again identified and appears stable to improved since prior examination. The index lymph node measures 20 x 22 mm at axial image # 26/2 previously measuring 26 x 23 mm. Remaining lymph nodes appears stable. No new pathologic adenopathy within the abdomen and pelvis. 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 The pelvic organs are otherwise unremarkable. Other: Diastasis of the rectus abdominus musculature is again noted without frank hernia formation. Rectum unremarkable Musculoskeletal: No acute bone abnormality. No lytic or blastic bone lesion. IMPRESSION: 1. No acute intracranial abnormality. No calvarial fracture. 2. No acute fracture or listhesis of the cervical spine. 3. No acute  intra-abdominal pathology identified. 4. Stable 7.8 cm  right renal mass in keeping with the patient's known primary renal cell carcinoma. Stable to slightly improved right perirenal adenopathy. 5. Interval increase in size of a right lower lobe pulmonary nodule and development of a smaller left lower lobe pulmonary nodule. While these findings may be infectious or inflammatory in nature, the possibility of metastatic disease is not excluded. Nonemergent dedicated CT imaging of the chest or PET CT examination may be helpful for further evaluation. Electronically Signed   By: Fidela Salisbury M.D.   On: 03/22/2022 21:49   CT Cervical Spine Wo Contrast  Result Date: 03/22/2022 CLINICAL DATA:  Abdominal pain, acute, nonlocalized; Head trauma, minor (Age >= 65y); Neck trauma (Age >= 65y) EXAM: CT HEAD WITHOUT CONTRAST CT CERVICAL SPINE WITHOUT CONTRAST CT ABDOMEN AND PELVIS WITHOUT CONTRAST TECHNIQUE: Contiguous axial images were obtained from the base of the skull through the vertex without intravenous contrast. Multidetector CT imaging of the cervical spine was performed without intravenous contrast. Multiplanar CT image reconstructions were also generated. Multidetector CT imaging of the abdomen and pelvis was performed following the standard protocol without IV contrast. RADIATION DOSE REDUCTION: This exam was performed according to the departmental dose-optimization program which includes automated exposure control, adjustment of the mA and/or kV according to patient size and/or use of iterative reconstruction technique. COMPARISON:  CT chest abdomen pelvis 01/19/2022, CT head/C-spine 09/09/2021 FINDINGS: CT HEAD FINDINGS Brain: Normal anatomic configuration. Parenchymal volume loss is commensurate with the patient's age. Mild periventricular white matter changes are present likely reflecting the sequela of small vessel ischemia. No abnormal intra or extra-axial mass lesion or fluid collection. No abnormal mass  effect or midline shift. No evidence of acute intracranial hemorrhage or infarct. Ventricular size is normal. Cerebellum unremarkable. Vascular: No asymmetric hyperdense vasculature at the skull base. Skull: Intact Sinuses/Orbits: Mild mucosal thickening within the visualized right maxillary sinus. Remaining paranasal sinuses are clear. Orbits are unremarkable. Other: Mastoid air cells and middle ear cavities are clear. CT CERVICAL SPINE FINDINGS Alignment: Normal. Skull base and vertebrae: Craniocervical alignment is normal. The atlantodental interval is not widened. No acute fracture of the cervical spine. Vertebral body height is preserved. Soft tissues and spinal canal: No prevertebral fluid or swelling. No visible canal hematoma. No cervical adenopathy. Mild atherosclerotic calcification within the carotid bifurcations. 13 mm nodule within the left thyroid gland, not well characterized on this examination. Not clinically significant; no follow-up imaging recommended (ref: J Am Coll Radiol. 2015 Feb;12(2): 143-50). Disc levels: There is intervertebral disc space narrowing, disc calcification, and endplate remodeling throughout the cervical spine in keeping with changes of diffuse advanced degenerative disc disease. The prevertebral soft tissues are not thickened. The spinal canal is widely patent. Multilevel uncovertebral and facet arthrosis results in multilevel mild neuroforaminal narrowing, most severe on the right at C3-4 and bilaterally at C5-6. Upper chest: Unremarkable Other: None CT ABDOMEN AND PELVIS FINDINGS Lower chest: Lobulated, spiculated nodule within the a right lower lobe has significantly enlarged in the interval since prior examination, now measuring 17 mm x 22 mm at axial image # 7/4. There is an internal air bronchograms noted, best appreciated on image # 4/4. Given its rate of growth as well as lack of significant mass effect upon the adjacent airway favors an inflammatory or  lymphoproliferative etiology, as can be seen with sarcoidosis, or pulmonary lymphoma, however, a metastatic focus is not completely excluded. Interval development of a elongate 9 x 5 mm nodule within the left lower lobe, axial image # 12/4. No  pleural effusion. Cardiac size is within normal limits. Hepatobiliary: No focal liver abnormality is seen. Status post cholecystectomy. No biliary dilatation. Pancreas: Unremarkable Spleen: Unremarkable Adrenals/Urinary Tract: The adrenal glands are unremarkable. 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 Left kidney is unremarkable on this noncontrast examination. No hydronephrosis. The bladder is unremarkable. 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 Calcified nodules adjacent to the appendix are unchanged from prior examination, possibly representing the sequela of remote trauma or inflammation. The stomach, small bowel, and large bowel are otherwise unremarkable. No evidence of obstruction or focal inflammation. No Frank intraperitoneal gas or fluid. Vascular/Lymphatic: Right perirenal adenopathy is again identified and appears stable to improved since prior examination. The index lymph node measures 20 x 22 mm at axial image # 26/2 previously measuring 26 x 23 mm. Remaining lymph nodes appears stable. No new pathologic adenopathy within the abdomen and pelvis. 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 The pelvic organs are otherwise unremarkable. Other: Diastasis of the rectus abdominus musculature is again noted without frank hernia formation. Rectum unremarkable Musculoskeletal: No acute bone abnormality. No lytic or blastic bone lesion. IMPRESSION: 1. No acute intracranial abnormality. No calvarial fracture. 2. No acute fracture or  listhesis of the cervical spine. 3. No acute intra-abdominal pathology identified. 4. Stable 7.8 cm right renal mass in keeping with the patient's known primary renal cell carcinoma. Stable to slightly improved right perirenal adenopathy. 5. Interval increase in size of a right lower lobe pulmonary nodule and development of a smaller left lower lobe pulmonary nodule. While these findings may be infectious or inflammatory in nature, the possibility of metastatic disease is not excluded. Nonemergent dedicated CT imaging of the chest or PET CT examination may be helpful for further evaluation. Electronically Signed   By: Fidela Salisbury M.D.   On: 03/22/2022 21:49   CT ABDOMEN PELVIS WO CONTRAST  Result Date: 03/22/2022 CLINICAL DATA:  Abdominal pain, acute, nonlocalized; Head trauma, minor (Age >= 65y); Neck trauma (Age >= 65y) EXAM: CT HEAD WITHOUT CONTRAST CT CERVICAL SPINE WITHOUT CONTRAST CT ABDOMEN AND PELVIS WITHOUT CONTRAST TECHNIQUE: Contiguous axial images were obtained from the base of the skull through the vertex without intravenous contrast. Multidetector CT imaging of the cervical spine was performed without intravenous contrast. Multiplanar CT image reconstructions were also generated. Multidetector CT imaging of the abdomen and pelvis was performed following the standard protocol without IV contrast. RADIATION DOSE REDUCTION: This exam was performed according to the departmental dose-optimization program which includes automated exposure control, adjustment of the mA and/or kV according to patient size and/or use of iterative reconstruction technique. COMPARISON:  CT chest abdomen pelvis 01/19/2022, CT head/C-spine 09/09/2021 FINDINGS: CT HEAD FINDINGS Brain: Normal anatomic configuration. Parenchymal volume loss is commensurate with the patient's age. Mild periventricular white matter changes are present likely reflecting the sequela of small vessel ischemia. No abnormal intra or extra-axial mass  lesion or fluid collection. No abnormal mass effect or midline shift. No evidence of acute intracranial hemorrhage or infarct. Ventricular size is normal. Cerebellum unremarkable. Vascular: No asymmetric hyperdense vasculature at the skull base. Skull: Intact Sinuses/Orbits: Mild mucosal thickening within the visualized right maxillary sinus. Remaining paranasal sinuses are clear. Orbits are unremarkable. Other: Mastoid air cells and middle ear cavities are clear. CT CERVICAL SPINE FINDINGS Alignment: Normal. Skull  base and vertebrae: Craniocervical alignment is normal. The atlantodental interval is not widened. No acute fracture of the cervical spine. Vertebral body height is preserved. Soft tissues and spinal canal: No prevertebral fluid or swelling. No visible canal hematoma. No cervical adenopathy. Mild atherosclerotic calcification within the carotid bifurcations. 13 mm nodule within the left thyroid gland, not well characterized on this examination. Not clinically significant; no follow-up imaging recommended (ref: J Am Coll Radiol. 2015 Feb;12(2): 143-50). Disc levels: There is intervertebral disc space narrowing, disc calcification, and endplate remodeling throughout the cervical spine in keeping with changes of diffuse advanced degenerative disc disease. The prevertebral soft tissues are not thickened. The spinal canal is widely patent. Multilevel uncovertebral and facet arthrosis results in multilevel mild neuroforaminal narrowing, most severe on the right at C3-4 and bilaterally at C5-6. Upper chest: Unremarkable Other: None CT ABDOMEN AND PELVIS FINDINGS Lower chest: Lobulated, spiculated nodule within the a right lower lobe has significantly enlarged in the interval since prior examination, now measuring 17 mm x 22 mm at axial image # 7/4. There is an internal air bronchograms noted, best appreciated on image # 4/4. Given its rate of growth as well as lack of significant mass effect upon the adjacent  airway favors an inflammatory or lymphoproliferative etiology, as can be seen with sarcoidosis, or pulmonary lymphoma, however, a metastatic focus is not completely excluded. Interval development of a elongate 9 x 5 mm nodule within the left lower lobe, axial image # 12/4. No pleural effusion. Cardiac size is within normal limits. Hepatobiliary: No focal liver abnormality is seen. Status post cholecystectomy. No biliary dilatation. Pancreas: Unremarkable Spleen: Unremarkable Adrenals/Urinary Tract: The adrenal glands are unremarkable. 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 Left kidney is unremarkable on this noncontrast examination. No hydronephrosis. The bladder is unremarkable. 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 Calcified nodules adjacent to the appendix are unchanged from prior examination, possibly representing the sequela of remote trauma or inflammation. The stomach, small bowel, and large bowel are otherwise unremarkable. No evidence of obstruction or focal inflammation. No Frank intraperitoneal gas or fluid. Vascular/Lymphatic: Right perirenal adenopathy is again identified and appears stable to improved since prior examination. The index lymph node measures 20 x 22 mm at axial image # 26/2 previously measuring 26 x 23 mm. Remaining lymph nodes appears stable. No new pathologic adenopathy within the abdomen and pelvis. 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 The pelvic organs are otherwise unremarkable. Other: Diastasis of the rectus abdominus musculature is again noted without frank hernia formation. Rectum unremarkable Musculoskeletal: No acute bone abnormality. No lytic or blastic bone lesion. IMPRESSION: 1. No acute intracranial abnormality. No calvarial  fracture. 2. No acute fracture or listhesis of the cervical spine. 3. No acute intra-abdominal pathology identified. 4. Stable 7.8 cm right renal mass in keeping with the patient's known primary renal cell carcinoma. Stable to slightly improved right perirenal adenopathy. 5. Interval increase in size of a right lower lobe pulmonary nodule and development of a smaller left lower lobe pulmonary nodule. While these findings may be infectious or inflammatory in nature, the possibility of metastatic disease is not excluded. Nonemergent dedicated CT imaging of the chest or PET CT examination may be helpful for further evaluation. Electronically Signed   By: Linwood Dibbles.D.  On: 03/22/2022 21:49   DG Chest Port 1 View  Result Date: 03/22/2022 CLINICAL DATA:  Altered mental status EXAM: PORTABLE CHEST 1 VIEW COMPARISON:  09/09/2021, CT 01/19/2022 FINDINGS: Cardiomegaly with aortic atherosclerosis. No acute airspace disease, effusion or pneumothorax. Postsurgical changes in the epigastric area IMPRESSION: No active disease.  Cardiomegaly Electronically Signed   By: Donavan Foil M.D.   On: 03/22/2022 20:50

## 2022-04-06 NOTE — Progress Notes (Signed)
Pt here for follow up. Pt currently at [facility].

## 2022-04-06 NOTE — Assessment & Plan Note (Signed)
Treatment plan as listed above. 

## 2022-04-06 NOTE — Assessment & Plan Note (Addendum)
Improved, not yet at her baseline. Recommend patient to follow-up in 2 to 3 weeks for reevaluation prior to starting on new medication.

## 2022-04-06 NOTE — Assessment & Plan Note (Signed)
Right RCC  with retroperitoneal lymphadenopathy, lung mass and peritoneal nodularity Progressed on 1st line treatment of Keytruda Did not tolerate 2nd line treatment of Axitinib '5mg'$  BID due to  encephalopathy Patient and POA are interested in other alternatives. We will check insurance coverage for cabozantinib, will start with reduced dose 40 mg daily.

## 2022-04-07 ENCOUNTER — Other Ambulatory Visit (HOSPITAL_COMMUNITY): Payer: Self-pay

## 2022-04-07 ENCOUNTER — Telehealth: Payer: Self-pay | Admitting: Pharmacy Technician

## 2022-04-07 NOTE — Telephone Encounter (Signed)
Oral Oncology Patient Advocate Encounter   Received notification that prior authorization for Cabometyx is required.   PA submitted on 04/07/2022 Key [id number] Status is pending     Lady Deutscher, CPhT-Adv Pharmacy Patient Advocate Specialist [address] Patient Advocate Team Direct Number: [phone]  Fax: [phone]

## 2022-04-07 NOTE — Telephone Encounter (Signed)
Oral Oncology Patient Advocate Encounter  Prior Authorization for Cabometyx has been approved.    PA#  [id number] Effective dates: 04/07/2022 through 10/04/2022  Patients co-pay is $100.    Lady Deutscher, CPhT-Adv Pharmacy Patient Advocate Specialist [place] Patient Advocate Team Direct Number: [phone]  Fax: [phone]

## 2022-04-12 ENCOUNTER — Telehealth: Payer: Self-pay | Admitting: Pharmacist

## 2022-04-12 ENCOUNTER — Other Ambulatory Visit (HOSPITAL_COMMUNITY): Payer: Self-pay

## 2022-04-12 DIAGNOSIS — C641 Malignant neoplasm of right kidney, except renal pelvis: Secondary | ICD-10-CM

## 2022-04-12 MED ORDER — CABOZANTINIB S-MALATE 40 MG PO TABS
40.0000 mg | ORAL_TABLET | Freq: Every day | ORAL | 0 refills | Status: DC
  Filled 2022-04-12: qty 30, 30d supply, fill #0

## 2022-04-12 NOTE — Telephone Encounter (Signed)
Oral Oncology Pharmacist Encounter  Received new prescription for Cabometyx (cabozantinib) for the treatment of RCC, planned duration until disease progression or unacceptable drug toxicity. Patient will be switching from treatment with axitinib due to intolerance (encephalopathy).   MD plans to wait 2 to 3 weeks for reevaluation of encephalopathy prior to starting the cabozantinib (per note on 04/06/22).  CBC/CMP from 04/06/22 assessed, no relevant lab abnormalities. Prescription dose and frequency assessed. MD plans on starting on a reduced dose for '40mg'$  daily.   Current medication list in Epic reviewed, a few DDIs with cabozantinib identified: Apixaban and Diltiazem: Apixaban and diltiazem may increase the serum concentration of cabozantinib. No baseline dose adjustment recommended. Monitor for increased cabozantinib toxicities.   Evaluated chart and no patient barriers to medication adherence identified.   Prescription has been e-scribed to the [facility] for benefits analysis and approval.  Oral Oncology Clinic will continue to follow for insurance authorization, copayment issues, initial counseling and start date.   Darl Pikes, PharmD, BCPS, BCOP, CPP Hematology/Oncology Clinical Pharmacist Practitioner [place]/DB/AP [facility] [place] [phone]  04/12/2022 3:03 PM

## 2022-04-13 ENCOUNTER — Other Ambulatory Visit (HOSPITAL_COMMUNITY): Payer: Self-pay

## 2022-04-13 MED ORDER — CABOMETYX 20 MG PO TABS
20.0000 mg | ORAL_TABLET | Freq: Every day | ORAL | 0 refills | Status: DC
  Filled 2022-04-13 – 2022-04-14 (×2): qty 30, 30d supply, fill #0

## 2022-04-14 ENCOUNTER — Other Ambulatory Visit (HOSPITAL_COMMUNITY): Payer: Self-pay

## 2022-04-28 ENCOUNTER — Other Ambulatory Visit (HOSPITAL_COMMUNITY): Payer: Self-pay

## 2022-04-28 ENCOUNTER — Inpatient Hospital Stay (HOSPITAL_BASED_OUTPATIENT_CLINIC_OR_DEPARTMENT_OTHER): Payer: Medicare PPO | Admitting: Oncology

## 2022-04-28 ENCOUNTER — Encounter: Payer: Self-pay | Admitting: Oncology

## 2022-04-28 ENCOUNTER — Inpatient Hospital Stay: Payer: Medicare PPO | Admitting: Pharmacist

## 2022-04-28 ENCOUNTER — Inpatient Hospital Stay: Payer: Medicare PPO

## 2022-04-28 VITALS — BP 133/68 | HR 86 | Resp 18 | Ht 62.0 in | Wt 161.0 lb

## 2022-04-28 DIAGNOSIS — Z5112 Encounter for antineoplastic immunotherapy: Secondary | ICD-10-CM

## 2022-04-28 DIAGNOSIS — R630 Anorexia: Secondary | ICD-10-CM | POA: Diagnosis not present

## 2022-04-28 DIAGNOSIS — C641 Malignant neoplasm of right kidney, except renal pelvis: Secondary | ICD-10-CM

## 2022-04-28 DIAGNOSIS — Z5111 Encounter for antineoplastic chemotherapy: Secondary | ICD-10-CM | POA: Diagnosis not present

## 2022-04-28 DIAGNOSIS — Z794 Long term (current) use of insulin: Secondary | ICD-10-CM

## 2022-04-28 DIAGNOSIS — G9340 Encephalopathy, unspecified: Secondary | ICD-10-CM | POA: Diagnosis not present

## 2022-04-28 DIAGNOSIS — E119 Type 2 diabetes mellitus without complications: Secondary | ICD-10-CM

## 2022-04-28 LAB — CBC WITH DIFFERENTIAL/PLATELET
Abs Immature Granulocytes: 0.04 10*3/uL (ref 0.00–0.07)
Basophils Absolute: 0 10*3/uL (ref 0.0–0.1)
Basophils Relative: 1 %
Eosinophils Absolute: 0.2 10*3/uL (ref 0.0–0.5)
Eosinophils Relative: 3 %
HCT: 39.7 % (ref 36.0–46.0)
Hemoglobin: 12.2 g/dL (ref 12.0–15.0)
Immature Granulocytes: 1 %
Lymphocytes Relative: 20 %
Lymphs Abs: 1.2 10*3/uL (ref 0.7–4.0)
MCH: 26.4 pg (ref 26.0–34.0)
MCHC: 30.7 g/dL (ref 30.0–36.0)
MCV: 85.9 fL (ref 80.0–100.0)
Monocytes Absolute: 0.6 10*3/uL (ref 0.1–1.0)
Monocytes Relative: 10 %
Neutro Abs: 3.9 10*3/uL (ref 1.7–7.7)
Neutrophils Relative %: 65 %
Platelets: 254 10*3/uL (ref 150–400)
RBC: 4.62 MIL/uL (ref 3.87–5.11)
RDW: 15.1 % (ref 11.5–15.5)
WBC: 6 10*3/uL (ref 4.0–10.5)
nRBC: 0 % (ref 0.0–0.2)

## 2022-04-28 LAB — COMPREHENSIVE METABOLIC PANEL
ALT: 11 U/L (ref 0–44)
AST: 14 U/L — ABNORMAL LOW (ref 15–41)
Albumin: 3.2 g/dL — ABNORMAL LOW (ref 3.5–5.0)
Alkaline Phosphatase: 103 U/L (ref 38–126)
Anion gap: 7 (ref 5–15)
BUN: 19 mg/dL (ref 8–23)
CO2: 27 mmol/L (ref 22–32)
Calcium: 10.2 mg/dL (ref 8.9–10.3)
Chloride: 106 mmol/L (ref 98–111)
Creatinine, Ser: 0.61 mg/dL (ref 0.44–1.00)
GFR, Estimated: >60 mL/min (ref >60)
Glucose, Bld: 111 mg/dL — ABNORMAL HIGH (ref 70–99)
Potassium: 3.8 mmol/L (ref 3.5–5.1)
Sodium: 140 mmol/L (ref 135–145)
Total Bilirubin: 0.4 mg/dL (ref 0.3–1.2)
Total Protein: 6.5 g/dL (ref 6.5–8.1)

## 2022-04-28 LAB — LACTATE DEHYDROGENASE: LDH: 102 U/L (ref 98–192)

## 2022-04-28 NOTE — Progress Notes (Unsigned)
Patient reports falling on Sunday and hurting her back/left shoulder.

## 2022-04-28 NOTE — Progress Notes (Signed)
[facility]  [phone] [phone]  Patient Care Team: Pcp, No as PCP - General [name], MD as Consulting Physician (Hematology and Oncology)   Name of the patient: Barbara Frank  [id number]  [birth year]   Date of visit: 04/28/22  HPI: Patient is a 76 y.o. female with RCC. Patient will be switching from treatment with axitinib due to intolerance (encephalopathy).   Reason for Consult: Cabometyx (cabozantinib) oral chemotherapy education.   PAST MEDICAL HISTORY: Past Medical History:  Diagnosis Date   A-fib [facility])    Depression    Diabetes mellitus without complication ([place])    History of kidney stones    Kidney mass     HEMATOLOGY/ONCOLOGY HISTORY:  Oncology History  Renal cell carcinoma ([place])  09/26/2020 Initial Diagnosis   Renal cell carcinoma ([place])   12/24/2020 Cancer Staging   Staging form: Kidney, AJCC 8th Edition - Clinical stage from 12/24/2020: Stage IV (cT1b, cN1, cM1) - Signed by [name], MD on 12/24/2020   01/08/2021 -  Chemotherapy   Patient is on Treatment Plan : RENAL CELL Pembrolizumab        ALLERGIES:  is allergic to kiwi extract.  MEDICATIONS:  Current Outpatient Medications  Medication Sig Dispense Refill   apixaban (ELIQUIS) 5 MG TABS tablet Take 1 tablet (5 mg total) by mouth 2 (two) times daily. 60 tablet 2   cabozantinib (CABOMETYX) 20 MG tablet Take 1 tablet (20 mg total) by mouth daily. Take on an empty stomach, 1 hour before or 2 hours after meals. 30 tablet 0   cetirizine (ZYRTEC) 10 MG tablet Take 0.5 tablets (5 mg total) by mouth daily. 15 tablet 0   cyanocobalamin 1000 MCG tablet Take 1,000 mcg by mouth daily.     diltiazem (CARDIZEM CD) 240 MG 24 hr capsule Take 1 capsule (240 mg total) by mouth every evening. 30 capsule 2   escitalopram (LEXAPRO) 10 MG tablet Take 10 mg by mouth daily.     insulin aspart (NOVOLOG) 100 UNIT/ML injection Inject 0-15 Units into the skin 3  (three) times daily with meals. CBG 70 - 120: 0 units,  121 - 150: 2 units, 151 - 200: 3 units,  201 - 250: 5 units,  251 - 300: 8 units, 301 - 350: 11 units 351 - 400: 15 units, CBG > 400: call MD and obtain STAT lab verification 10 mL 11   insulin glargine (LANTUS) 100 UNIT/ML injection Inject 0.25 mLs (25 Units total) into the skin 2 (two) times daily. 10 mL 11   metFORMIN (GLUCOPHAGE-XR) 500 MG 24 hr tablet Take 1 tablet (500 mg total) by mouth 2 (two) times daily. (Patient taking differently: Take 1,000 mg by mouth daily.) 60 tablet 2   metoprolol succinate (TOPROL-XL) 25 MG 24 hr tablet Take 25 mg by mouth daily.     nystatin (MYCOSTATIN/NYSTOP) powder Apply 1 Application topically 2 (two) times daily. Apply to abdominal fold and groin in the mornings  Applyunder both breasts at night     polyethylene glycol (MIRALAX / GLYCOLAX) 17 g packet Take 17 g by mouth daily.     No current facility-administered medications for this visit.    VITAL SIGNS: There were no vitals taken for this visit. There were no vitals filed for this visit.  Estimated body mass index is 29.17 kg/m as calculated from the following:   Height as of 03/22/22: '5\' 2"'$  (1.575 m).   Weight as of 04/06/22: 72.3 kg (  159 lb 8 oz).  LABS: CBC:    Component Value Date/Time   WBC 5.5 04/06/2022 1317   HGB 12.4 04/06/2022 1317   HCT 40.4 04/06/2022 1317   PLT 326 04/06/2022 1317   MCV 86.3 04/06/2022 1317   NEUTROABS 3.5 04/06/2022 1317   LYMPHSABS 1.4 04/06/2022 1317   MONOABS 0.4 04/06/2022 1317   EOSABS 0.1 04/06/2022 1317   BASOSABS 0.0 04/06/2022 1317   Comprehensive Metabolic Panel:    Component Value Date/Time   NA 139 04/06/2022 1317   K 4.2 04/06/2022 1317   CL 106 04/06/2022 1317   CO2 27 04/06/2022 1317   BUN 19 04/06/2022 1317   CREATININE 0.59 04/06/2022 1317   GLUCOSE 139 (H) 04/06/2022 1317   CALCIUM 9.9 04/06/2022 1317   CALCIUM 10.4 (H) 07/28/2021 0808   AST 17 04/06/2022 1317   ALT 10  04/06/2022 1317   ALKPHOS 84 04/06/2022 1317   BILITOT 0.4 04/06/2022 1317   PROT 6.0 (L) 04/06/2022 1317   ALBUMIN 2.9 (L) 04/06/2022 1317     Present during today's visit: ***  Start plan: ***   Patient Education I spoke with patient for overview of new oral chemotherapy medication: ***   Administration: Counseled patient on administration, dosing, side effects, monitoring, drug-food interactions, safe handling, storage, and disposal. Patient will take ***.  Side Effects: Side effects include but not limited to: ***.    Drug-drug Interactions (DDI): ***  Adherence: After discussion with patient *** patient barriers to medication adherence identified.  Reviewed with patient importance of keeping a medication schedule and plan for any missed doses.  *** voiced understanding and appreciation. All questions answered. Medication handout provided.  Provided patient with [facility] phone number. Patient knows to call the office with questions or concerns. Oral Chemotherapy Navigation Clinic will continue to follow.  Patient expressed understanding and was in agreement with this plan. She also understands that She can call clinic at any time with any questions, concerns, or complaints.   Medication Access Issues: ***  Follow-up plan: ***  Thank you for allowing me to participate in the care of this patient.   Time Total: ***  Visit consisted of counseling and education on dealing with issues of symptom management in the setting of serious and potentially life-threatening illness.Greater than 50%  of this time was spent counseling and coordinating care related to the above assessment and plan.  Signed by: Darl Pikes, PharmD, BCPS, Salley Slaughter, CPP Hematology/Oncology Clinical Pharmacist Practitioner [place]/DB/AP [facility] [phone]  04/28/2022 12:42 PM

## 2022-04-29 ENCOUNTER — Encounter: Payer: Self-pay | Admitting: Oncology

## 2022-04-29 NOTE — Assessment & Plan Note (Signed)
Improved. Appears to have memory loss.  No intracranial metastasis on recent MRI

## 2022-04-29 NOTE — Progress Notes (Signed)
Hematology/Oncology Progress note [phone] [phone]      Patient Care Team: Pcp, No as PCP - General Barbara Server, MD as Consulting Physician (Hematology and Oncology)    ASSESSMENT & PLAN:   Cancer Staging  Renal cell carcinoma [facility]) Staging form: Kidney, AJCC 8th Edition - Clinical stage from 12/24/2020: Stage IV (cT1b, cN1, cM1) - Signed by Barbara Server, MD on 12/24/2020    Renal cell carcinoma ([place]) Right RCC  with retroperitoneal lymphadenopathy, lung mass and peritoneal nodularity Progressed on 1st line treatment of Keytruda Did not tolerate 2nd line treatment of Axitinib 46m BID due to  encephalopathy Patient and POA are interested in other alternatives. Labs reviewed and discussed with patient.  Recommend cabozantinib, will start with reduced dose 20 mg daily, titrate up to 40 mg daily in the future if she tolerates.  Rationale and potential side effects were reviewed and discussed with patient and POA and they agree with the plan.  Encounter for antineoplastic chemotherapy Treatment plan as listed above  Encephalopathy Improved. Appears to have memory loss.  No intracranial metastasis on recent MRI  Type 2 diabetes mellitus without complication, with long-term current use of insulin (HCC) Glycemic control has improved. Continue follow-up with primary care provider  Fall precaution.  Physical therapy  Orders Placed This Encounter  Procedures   CBC with Differential/Platelet    Standing Status:   Future    Standing Expiration Date:   04/29/2023   Comprehensive metabolic panel    Standing Status:   Future    Standing Expiration Date:   04/28/2023   [facility]    Referral Priority:   Routine    Referral Type:   Consultation    Referral Reason:   Specialty Services Required    Number of Visits Requested:   1   Follow-up in 2 to 3 weeks after started on, cabozantinib All questions were answered. The patient knows to  call the clinic with any problems, questions or concerns.  ZEarlie Server MD, PhD [facility] Oncology 04/28/2022   CHIEF COMPLAINTS/REASON FOR VISIT:  Metastatic RCC  HISTORY OF PRESENTING ILLNESS:   Patient is a poor historian. Her mood appears to be depressed.  She currently lives at assisted living facility. [place] [place] [facility] [place].  Her power of attorney is JDrucie Ipwho is her husband's niece. JAlmyra Freewas called during this encounter.   Extensive medical record review was performed. She has had multiple images done during the past few months, 05/27/20 CT head wo contrast- no acute intracranial abnormality.  06/01/20 CT abdomen and pelvis with contrast 4 mm proximal left ureteral calculus causing mild proximal left- sided hydroureteronephrosis with periureteric and perinephric stranding on the left.5.2cm enhancing right mid anterior kidney mass. Mildly enlarged right retroperitoneal retrocaval lymph nodes suspicious for nodal metastases Mild cardiomegaly, Small hiatal hernia. Status post gastric bypass surgery and cholecystectomy. Postoperative changes anterior abdominal wall. 06/05/20 CT head wo contrast - No acute intracranial abnormality. Probable tiny right frontal scalp Contusion. 2. Mild global volume loss and minimal chronic ischemic small vessel disease. 06/06/20 CT abdomen pelvis wo contrast 1. Indeterminate lesion in the right kidney which is stable may be a solid mass., 2. Partially calcified uterine fibroids. 3. Left UVJ calculus measuring 5 mm 06/07/20 CT head wo contrast.  1. No acute intracranial abnormality. No significant change. 2. Intracranial atherosclerosis, atrophy and chronic small vessel ischemic changes. 3. Chronic/subacute right maxillary sinus disease.  06/07/20 Cystoscopy by  Barbara Frank for Ureteroscopic stone extraction Admitted on 10/3/21due to failure to thrive,  anorexia, dehydration, electrolyte imbalances.   06/09/20 CT abdomen  and chest with contrast 1. Right renal mass is stable in size. Most suggestive of a renal neoplasm. 2. Right retroperitoneal lymph nodes are stable in size; may well be metastatic. 3. Left hydronephrosis and hydroureter is stable. Pelvis is not imaged therefore if there is a distal calculus still present, it is not visible. 4. Sclerotic lesions in the anterior left seventh, eighth and ninth ribs which given distribution are most likely healed rib fractures.  She is on Eliquis 46m Bid for atrial fibrillation. She reports not taking anti depressants that were given by her PCP.  #11/04/2020, PET scan was obtained. PET scan showed interval increasing size of right upper lobe pulmonary lesion, now 3 cm with an SUV of 3.8.  Associated with a new right lower lobe nodule 8 mm.  5.1 cm right renal mass hypermetabolic activity.  1.9 retrocaval lymph node with SUV of 4.8.  Second index retrocaval node with SUV of 2.  Nodular disease seen in the duodenum on previous study is not well demonstrated on noncontrast CT image today.  Some nodular FDG activity, nonspecific. Multiple anterior abdominal soft tissue nodules are evident.  13 mm anterior left nodule shows no substantial hypermetabolic with an SUV of 1.7.  Calcified left lateral nodule with an SUV of 2.9.  2.2 cm calcified nodule lobe right paracolic gutter with an SUV of 2.5.  12/17/2020, right kidney mass biopsy pathology was reviewed and discussed with patient.  Pathology is consistent with RCC-demonstrates the morphology spectrum, papillary, solid and nested areas.  CD10 positive, racemase positive, CK7 negative.  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  TFE 3 break apart FISH testing is pending.  #01/05/2021, addendum pathology TFE3 break-apart FISH testing was performed and a translocation  involving Xp11 / TFE3 transcription gene was not detected. HMB-45 IHC is negative  CAIX IHC demonstrates membranous staining.  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  MRI brain was obtained during the admission and was negative for acute process.  Patient mental status improved.  UTI was treated with IV antibiotics and switched to p.o. antibiotic upon discharge.  Patient also was found to have rapid ventricular response with her atrial fibrillation.  Rate was treated with Cardizem drip during hospitalization because and switched to Toprol and Cardizem for rate control.  Eliquis for anticoagulation. Patient's axitinib was held during hospitalization.  There was plan for patient to restart axitinib upon discharge.  However since patient was sent to rehab, he is not able to be started on axitinib due to lack of access to her medication supply.  She [facility]  Patient presents for post hospitalization follow-up.  She does not have much recollection of her recent admission.  Reports feeling well today.  He denies any fever, chills, nausea vomiting diarrhea.  03/07/2021, CT abdomen pelvis  Right renal mass slightly enlarged since 10/03/2020.  5.4 x 4.9 x 6.4 comparing to previously 5.0 x 4.4 x 6.3.  Stable adjacent right retroperitoneal lymphadenopathy.  There is a new 1.5 cm nodule in  the right lower lobe.  Mild stranding around the right renal mass.  04/01/2022 resumed Axitinib 87m BID 05/06/2022 she presented for follow up, she stopped " all her medications". Tachycardia, hyperglycemia, she declined ER evaluation.  06/03/2022, presented for follow up.  Multiple no show to her CT scan. Palliative care service has not been able to reach her  06/23/2021, CT chest abdomen pelvis  showed stable large right anterior renal mass.  Stable to slightly larger right-sided retroperitoneal adenopathy.  Waxing and waning pulmonary nodules.  2 lower lobe nodules resolved.  New right middle lobe and right upper lobe nodules.  06/29/2022  Hold chemotherapy due to hyperglycemia.  Blood sugar was 431, due to noncompliance.  Recommend patient to go to emergency room for evaluation and she declines. talked to patient's PCP TOlivia Mackieover the phone and discussed patient's case with her. Patient is not complaint with her medication. Her diabetes was poorly controlled and glucose was extremely high, she refused to go to ER for management. She is also extremely depressed. Most recent CT scan shows stable disease. We both agree that patient is not able to take care of herself.  TOlivia Mackiewill try to get her to see psychiatrist for evaluation of her depression,she may benefit from living in a facility as well. From Oncology aspect, her prognosis is poor, and I will hold off additional treatment until her other acute medical conditions are stabilized. Hospice/palliative care is reasonable if her condition continues to deteriorate. TOlivia Mackiewill update me if she improves. .   07/23/2021 - 07/31/2021, hospitalized due to A. fib with RVR. 09/09/2021 - 09/14/2021, the patient due to A. fib with RVR, uncontrolled diabetes with glucose of 500s, DKA. 09/09/2021, CT angio chest PE protocol showed interval decrease of the right upper lobe nodularity.  No PE.  10/09/2021 , re-establish care, she expressed interests of resuming RCC treatments. She agrees with getting CT restaging. She reports being complaint with her medication.   10/28/21 CT showed progression of right renal mass, retroperitoneal lymphadenopathy. Stable small lung nodules. Cardiomegaly with biatrial dilatation. Aortic atherosclerosis. calcifications of the aortic valve, Dilatation of the pulmonic trunk   01/20/2022, CT chest abdomen pelvis with contrast showed  heterogeneous mass of the anterior midportion of the right kidney is slightly increased in size, as is an adjacent right retroperitoneal lymph node.  Consistent with disease progression.  Findings are consistent with worsened renal cell carcinoma and adjacent nodal metastatic disease. New pulmonary nodule of the superior segment right lower lobe measuring 0.7 cm, highly suspicious for a small metastasis. Multiple additional tiny pulmonary nodules are unchanged and likely benign and incidental. Attention on follow-up. Coronary artery disease.  03/08/2022-03/22/22   axitinib 5 mg twice daily. 03/22/2022-03/26/2022 patient was admitted due to altered mental status, delirium.  Urine culture was negative.  MRI brain with contrast was negative for metastasis or stroke.  CT abdomen pelvis and chest showed pulmonary nodules and existing renal mass. Due to concern of possible encephalopathy secondary to axitinib, axitinib was discontinued   INTERVAL HISTORY LBritani Beattieis a 76y.o. female who has above history reviewed by me today presents for follow up visit for management of metastatic renal clear-cell carcinoma Today she is accompanied by her friend DErnie Hew Patient reports feeling well today.  She has had a fall a few days ago.+ mild back pain and shoulder pain    Review of Systems  Constitutional:  Positive for fatigue. Negative for appetite change, chills and fever.  HENT:  Negative for hearing loss and voice change.   Eyes:  Negative for eye problems.  Respiratory:  Negative for chest tightness and cough.   Cardiovascular:  Negative for chest pain.  Gastrointestinal:  Negative for abdominal distention, abdominal pain, blood in stool, constipation and diarrhea.  Endocrine: Negative for hot flashes.  Genitourinary:  Negative for difficulty urinating and frequency.   Musculoskeletal:  Positive for back pain. Negative for arthralgias.  Skin:  Negative for itching and rash.  Neurological:  Negative  for extremity weakness.  Hematological:  Negative for adenopathy.  Psychiatric/Behavioral:  Positive for depression. Negative for confusion.     MEDICAL HISTORY:  Past Medical History:  Diagnosis Date   A-fib ([place])    Depression    Diabetes mellitus without complication ([place])    History of kidney stones    Kidney mass     SURGICAL HISTORY: Past Surgical History:  Procedure Laterality Date   GASTRIC BYPASS     HERNIA REPAIR     TONSILLECTOMY     URETEROSCOPY WITH HOLMIUM LASER LITHOTRIPSY Left     SOCIAL HISTORY: Social History   Socioeconomic History   Marital status: Widowed    Spouse name: Not on file   Number of children: Not on file   Years of education: Not on file   Highest education level: Not on file  Occupational History   Not on file  Tobacco Use   Smoking status: Never   Smokeless tobacco: Never  Vaping Use   Vaping Use: Never used  Substance and Sexual Activity   Alcohol use: Not Currently   Drug use: Not Currently   Sexual activity: Not Currently  Other Topics Concern   Not on file  Social History Narrative   Not on file   Social Determinants of Health   Financial Resource Strain: Not on file  Food Insecurity: Not on file  Transportation Needs: Not on file  Physical Activity: Not on file  Stress: Not on file  Social Connections: Not on file  Intimate Partner Violence: Not on file    FAMILY HISTORY: Family History  Problem Relation Age of Onset   Breast cancer Mother    Bladder Cancer Neg Hx    Kidney cancer Neg Hx    Prostate cancer Neg Hx     ALLERGIES:  is allergic to kiwi extract.  MEDICATIONS:  Current Outpatient Medications  Medication Sig Dispense Refill   escitalopram (LEXAPRO) 10 MG tablet Take 10 mg by mouth daily.     insulin glargine (LANTUS) 100 UNIT/ML injection Inject 0.25 mLs (25 Units total) into the skin 2 (two) times daily. 10 mL 11   metoprolol succinate (TOPROL-XL) 25 MG 24 hr tablet Take 25 mg by mouth  daily.     apixaban (ELIQUIS) 5 MG TABS tablet Take 1 tablet (5 mg total) by mouth 2 (two) times daily. 60 tablet 2   cabozantinib (CABOMETYX) 20 MG tablet Take 1 tablet (20 mg total) by mouth daily. Take on an empty stomach, 1 hour before or 2 hours after meals. (Patient not taking: Reported on 04/28/2022) 30 tablet 0   cetirizine (ZYRTEC) 10 MG tablet Take 0.5 tablets (5 mg total) by mouth daily. 15 tablet 0   cyanocobalamin (VITAMIN B12) 1000 MCG/ML injection Inject into the muscle. (Patient not taking: Reported on 04/28/2022)     cyanocobalamin 1000 MCG tablet Take 1,000 mcg by mouth daily. (Patient not taking: Reported on 04/28/2022)     diltiazem (CARDIZEM CD) 240 MG 24  hr capsule Take 1 capsule (240 mg total) by mouth every evening. 30 capsule 2   insulin aspart (NOVOLOG) 100 UNIT/ML injection Inject 0-15 Units into the skin 3 (three) times daily with meals. CBG 70 - 120: 0 units,  121 - 150: 2 units, 151 - 200: 3 units,  201 - 250: 5 units,  251 - 300: 8 units, 301 - 350: 11 units 351 - 400: 15 units, CBG > 400: call MD and obtain STAT lab verification (Patient not taking: Reported on 04/28/2022) 10 mL 11   metFORMIN (GLUCOPHAGE-XR) 500 MG 24 hr tablet Take 1 tablet (500 mg total) by mouth 2 (two) times daily. (Patient taking differently: Take 1,000 mg by mouth daily.) 60 tablet 2   nystatin (MYCOSTATIN/NYSTOP) powder Apply 1 Application topically 2 (two) times daily. Apply to abdominal fold and groin in the mornings  Applyunder both breasts at night (Patient not taking: Reported on 04/28/2022)     polyethylene glycol (MIRALAX / GLYCOLAX) 17 g packet Take 17 g by mouth daily. (Patient not taking: Reported on 04/28/2022)     No current facility-administered medications for this visit.     PHYSICAL EXAMINATION: ECOG PERFORMANCE STATUS: 2 - Symptomatic, <50% confined to bed Vitals:   04/28/22 1401  BP: 133/68  Pulse: 86  Resp: 18  SpO2: 98%   Filed Weights   04/28/22 1401  Weight: 161 lb  (73 kg)    Physical Exam Constitutional:      General: She is not in acute distress.    Comments: Frail elderly female, she ambulates with a walker.   HENT:     Head: Normocephalic and atraumatic.  Eyes:     General: No scleral icterus. Cardiovascular:     Rate and Rhythm: Normal rate. Rhythm irregular.     Heart sounds: Normal heart sounds.  Pulmonary:     Effort: Pulmonary effort is normal. No respiratory distress.     Breath sounds: No wheezing.  Abdominal:     General: Bowel sounds are normal. There is no distension.     Palpations: Abdomen is soft.  Musculoskeletal:        General: No deformity. Normal range of motion.     Cervical back: Normal range of motion and neck supple.  Skin:    General: Skin is warm and dry.     Findings: No erythema or rash.  Neurological:     Mental Status: She is alert and oriented to person, place, and time.     Cranial Nerves: No cranial nerve deficit.     Coordination: Coordination normal.     LABORATORY DATA:  I have reviewed the data as listed    Latest Ref Rng & Units 04/28/2022    1:07 PM 04/06/2022    1:17 PM 03/23/2022    4:16 AM  CBC  WBC 4.0 - 10.5 K/uL 6.0  5.5  7.9   Hemoglobin 12.0 - 15.0 g/dL 12.2  12.4  13.6   Hematocrit 36.0 - 46.0 % 39.7  40.4  42.8   Platelets 150 - 400 K/uL 254  326  202       Latest Ref Rng & Units 04/28/2022    1:07 PM 04/06/2022    1:17 PM 03/25/2022    5:12 AM  CMP  Glucose 70 - 99 mg/dL 111  139  123   BUN 8 - 23 mg/dL '19  19  18   ' Creatinine 0.44 - 1.00 mg/dL 0.61  0.59  0.57  Sodium 135 - 145 mmol/L 140  139  138   Potassium 3.5 - 5.1 mmol/L 3.8  4.2  3.8   Chloride 98 - 111 mmol/L 106  106  105   CO2 22 - 32 mmol/L '27  27  30   ' Calcium 8.9 - 10.3 mg/dL 10.2  9.9  10.1   Total Protein 6.5 - 8.1 g/dL 6.5  6.0    Total Bilirubin 0.3 - 1.2 mg/dL 0.4  0.4    Alkaline Phos 38 - 126 U/L 103  84    AST 15 - 41 U/L 14  17    ALT 0 - 44 U/L 11  10         RADIOGRAPHIC STUDIES: I have  personally reviewed the radiological images as listed and agreed with the findings in the report. No results found.

## 2022-04-29 NOTE — Addendum Note (Signed)
Addended by: [name] on: 04/29/2022 08:05 AM   Modules accepted: Orders

## 2022-04-29 NOTE — Assessment & Plan Note (Signed)
Treatment plan as listed above. 

## 2022-04-29 NOTE — Assessment & Plan Note (Signed)
Glycemic control has improved. Continue follow-up with primary care provider

## 2022-04-29 NOTE — Assessment & Plan Note (Addendum)
Right RCC  with retroperitoneal lymphadenopathy, lung mass and peritoneal nodularity Progressed on 1st line treatment of Keytruda Did not tolerate 2nd line treatment of Axitinib '5mg'$  BID due to  encephalopathy Patient and POA are interested in other alternatives. Labs reviewed and discussed with patient.  Recommend cabozantinib, will start with reduced dose 20 mg daily, titrate up to 40 mg daily in the future if she tolerates.  Rationale and potential side effects were reviewed and discussed with patient and POA and they agree with the plan.

## 2022-05-03 ENCOUNTER — Telehealth: Payer: Self-pay | Admitting: Student

## 2022-05-03 NOTE — Telephone Encounter (Signed)
Received call from patient's POA. Patient moved back to [place] last week from [facility]. He states patient is needing assist with medication management, encouragement going to meals. Discussed role of Palliative Medicine vs. Hospice services. Discussed higher level of care. Will schedule follow up appointment.

## 2022-05-03 NOTE — Telephone Encounter (Signed)
Attempted to contact patient to schedule Palliative Consult (post discharge from [facility] back to home), no answer - left message requesting a return call to schedule.

## 2022-05-06 ENCOUNTER — Other Ambulatory Visit (HOSPITAL_COMMUNITY): Payer: Self-pay

## 2022-05-07 ENCOUNTER — Other Ambulatory Visit (HOSPITAL_COMMUNITY): Payer: Self-pay

## 2022-05-11 ENCOUNTER — Telehealth: Payer: Self-pay | Admitting: Student

## 2022-05-11 NOTE — Telephone Encounter (Signed)
Palliative NP attempted to reach patient to schedule f/u appointment. No voicemail available.

## 2022-05-12 ENCOUNTER — Ambulatory Visit: Payer: Medicare PPO | Admitting: Oncology

## 2022-05-12 ENCOUNTER — Other Ambulatory Visit: Payer: Medicare PPO

## 2022-05-19 ENCOUNTER — Other Ambulatory Visit: Payer: Self-pay | Admitting: Oncology

## 2022-05-19 ENCOUNTER — Other Ambulatory Visit: Payer: Self-pay | Admitting: Pharmacist

## 2022-05-19 ENCOUNTER — Other Ambulatory Visit (HOSPITAL_COMMUNITY): Payer: Self-pay

## 2022-05-19 ENCOUNTER — Inpatient Hospital Stay (HOSPITAL_BASED_OUTPATIENT_CLINIC_OR_DEPARTMENT_OTHER): Payer: Medicare PPO | Admitting: Oncology

## 2022-05-19 ENCOUNTER — Encounter: Payer: Self-pay | Admitting: Oncology

## 2022-05-19 ENCOUNTER — Inpatient Hospital Stay: Payer: Medicare PPO | Attending: Oncology

## 2022-05-19 VITALS — BP 131/80 | HR 72 | Temp 98.7°F | Resp 20 | Wt 157.1 lb

## 2022-05-19 DIAGNOSIS — E1165 Type 2 diabetes mellitus with hyperglycemia: Secondary | ICD-10-CM

## 2022-05-19 DIAGNOSIS — Z794 Long term (current) use of insulin: Secondary | ICD-10-CM | POA: Diagnosis not present

## 2022-05-19 DIAGNOSIS — C641 Malignant neoplasm of right kidney, except renal pelvis: Secondary | ICD-10-CM

## 2022-05-19 DIAGNOSIS — Z79899 Other long term (current) drug therapy: Secondary | ICD-10-CM | POA: Diagnosis not present

## 2022-05-19 DIAGNOSIS — Z5111 Encounter for antineoplastic chemotherapy: Secondary | ICD-10-CM | POA: Diagnosis not present

## 2022-05-19 DIAGNOSIS — C799 Secondary malignant neoplasm of unspecified site: Secondary | ICD-10-CM | POA: Diagnosis not present

## 2022-05-19 DIAGNOSIS — E119 Type 2 diabetes mellitus without complications: Secondary | ICD-10-CM

## 2022-05-19 DIAGNOSIS — I4891 Unspecified atrial fibrillation: Secondary | ICD-10-CM | POA: Diagnosis not present

## 2022-05-19 DIAGNOSIS — Z7901 Long term (current) use of anticoagulants: Secondary | ICD-10-CM | POA: Diagnosis not present

## 2022-05-19 LAB — CBC WITH DIFFERENTIAL/PLATELET
Abs Immature Granulocytes: 0.02 10*3/uL (ref 0.00–0.07)
Basophils Absolute: 0 10*3/uL (ref 0.0–0.1)
Basophils Relative: 0 %
Eosinophils Absolute: 0.1 10*3/uL (ref 0.0–0.5)
Eosinophils Relative: 2 %
HCT: 42 % (ref 36.0–46.0)
Hemoglobin: 13.4 g/dL (ref 12.0–15.0)
Immature Granulocytes: 0 %
Lymphocytes Relative: 32 %
Lymphs Abs: 1.7 10*3/uL (ref 0.7–4.0)
MCH: 26.2 pg (ref 26.0–34.0)
MCHC: 31.9 g/dL (ref 30.0–36.0)
MCV: 82 fL (ref 80.0–100.0)
Monocytes Absolute: 0.3 10*3/uL (ref 0.1–1.0)
Monocytes Relative: 6 %
Neutro Abs: 3.1 10*3/uL (ref 1.7–7.7)
Neutrophils Relative %: 60 %
Platelets: 198 10*3/uL (ref 150–400)
RBC: 5.12 MIL/uL — ABNORMAL HIGH (ref 3.87–5.11)
RDW: 14.5 % (ref 11.5–15.5)
WBC: 5.3 10*3/uL (ref 4.0–10.5)
nRBC: 0 % (ref 0.0–0.2)

## 2022-05-19 LAB — COMPREHENSIVE METABOLIC PANEL
ALT: 20 U/L (ref 0–44)
AST: 24 U/L (ref 15–41)
Albumin: 3.1 g/dL — ABNORMAL LOW (ref 3.5–5.0)
Alkaline Phosphatase: 172 U/L — ABNORMAL HIGH (ref 38–126)
Anion gap: 5 (ref 5–15)
BUN: 10 mg/dL (ref 8–23)
CO2: 27 mmol/L (ref 22–32)
Calcium: 9.8 mg/dL (ref 8.9–10.3)
Chloride: 103 mmol/L (ref 98–111)
Creatinine, Ser: 0.6 mg/dL (ref 0.44–1.00)
GFR, Estimated: >60 mL/min (ref >60)
Glucose, Bld: 277 mg/dL — ABNORMAL HIGH (ref 70–99)
Potassium: 3.8 mmol/L (ref 3.5–5.1)
Sodium: 135 mmol/L (ref 135–145)
Total Bilirubin: 0.4 mg/dL (ref 0.3–1.2)
Total Protein: 6.4 g/dL — ABNORMAL LOW (ref 6.5–8.1)

## 2022-05-19 MED ORDER — CABOMETYX 20 MG PO TABS
20.0000 mg | ORAL_TABLET | Freq: Every day | ORAL | 0 refills | Status: DC
  Filled 2022-05-19: qty 30, 30d supply, fill #0

## 2022-05-19 MED ORDER — CABOMETYX 20 MG PO TABS
40.0000 mg | ORAL_TABLET | Freq: Every day | ORAL | 0 refills | Status: DC
  Filled 2022-05-19: qty 60, 30d supply, fill #0

## 2022-05-19 NOTE — Assessment & Plan Note (Signed)
Uncontrolled. Continue follow-up with primary care provider 

## 2022-05-19 NOTE — Progress Notes (Signed)
Hematology/Oncology Progress note [phone] [phone]      Patient Care Team: Pcp, No as PCP - General Barbara Server, MD as Consulting Physician (Hematology and Oncology)    ASSESSMENT & PLAN:   Cancer Staging  Renal cell carcinoma Barbara Frank) Staging form: Kidney, AJCC 8th Edition - Clinical stage from 12/24/2020: Stage IV (cT1b, cN1, cM1) - Signed by Barbara Server, MD on 12/24/2020    Renal cell carcinoma (Barbara Frank) Right RCC  with retroperitoneal lymphadenopathy, lung mass and peritoneal nodularity Progressed on 1st line treatment of Keytruda Did not tolerate 2nd line treatment of Axitinib 33m BID due to  encephalopathy Patient and POA are interested in other alternatives. Labs reviewed and discussed with patient.  She tolerates cabozantinib, 20 mg daily, recommend to increase to 40 mg    Encounter for antineoplastic chemotherapy Treatment plan as listed above  Type 2 diabetes mellitus without complication, with long-term current use of insulin (HCC) Uncontrolled. Continue follow-up with primary care provider    Orders Placed This Encounter  Procedures   CBC with Differential/Platelet    Standing Status:   Future    Standing Expiration Date:   05/20/2023   Comprehensive metabolic panel    Standing Status:   Future    Standing Expiration Date:   05/19/2023   Follow-up in 2 to Frank weeks  All questions were answered. The patient knows to call the clinic with any problems, questions or concerns.  ZEarlie Server MD, PhD [facility] Hematology Oncology 05/19/2022   CHIEF COMPLAINTS/REASON FOR VISIT:  Metastatic RCC  HISTORY OF PRESENTING ILLNESS:   Patient is a poor historian. Her mood appears to be depressed.  She currently lives at assisted living facility. [place].  Her power of attorney is Barbara Ipwho is her husband's niece. JAlmyra Freewas called during this encounter.   Extensive medical record review was performed. She has had  multiple images done during the past few months, 05/27/20 CT head wo contrast- no acute intracranial abnormality.  06/01/20 CT abdomen and pelvis with contrast 4 mm proximal left ureteral calculus causing mild proximal left- sided hydroureteronephrosis with periureteric and perinephric stranding on the left.5.2cm enhancing right mid anterior kidney mass. Mildly enlarged right retroperitoneal retrocaval lymph nodes suspicious for nodal metastases Mild cardiomegaly, Small hiatal hernia. Status post gastric bypass surgery and cholecystectomy. Postoperative changes anterior abdominal wall. 06/05/20 CT head wo contrast - No acute intracranial abnormality. Probable tiny right frontal scalp Contusion. 2. Mild global volume loss and minimal chronic ischemic small vessel disease. 06/06/20 CT abdomen pelvis wo contrast 1. Indeterminate lesion in the right kidney which is stable may be a solid mass., 2. Partially calcified uterine fibroids. Frank. Left UVJ calculus measuring 5 mm 06/07/20 CT head wo contrast.  1. No acute intracranial abnormality. No significant change. 2. Intracranial atherosclerosis, atrophy and chronic small vessel ischemic changes. Frank. Chronic/subacute right maxillary sinus disease.  06/07/20 Cystoscopy by Barbara BealsAArnell Sievingfor Ureteroscopic stone extraction Admitted on 10/Frank/21due to failure to thrive,  anorexia, dehydration, electrolyte imbalances.   06/09/20 CT abdomen and chest with contrast 1. Right renal mass is stable in size. Most suggestive of a renal neoplasm. 2. Right retroperitoneal lymph nodes are stable in size; may well be metastatic. Frank. Left hydronephrosis and hydroureter is stable. Pelvis is not imaged therefore if there is a distal calculus still present, it is not visible. 4. Sclerotic lesions in the anterior left seventh, eighth and ninth ribs which given distribution are most likely  healed rib fractures.  She is on Barbara Frank 10m Bid for atrial fibrillation. She reports not taking  anti depressants that were given by her PCP.  #Frank/09/2020, PET scan was obtained. PET scan showed interval increasing size of right upper lobe pulmonary lesion, now Frank cm with an SUV of Frank.8.  Associated with a new right lower lobe nodule 8 mm.  5.1 cm right renal mass hypermetabolic activity.  1.9 retrocaval lymph node with SUV of 4.8.  Second index retrocaval node with SUV of 2.  Nodular disease seen in the duodenum on previous study is not well demonstrated on noncontrast CT image today.  Some nodular FDG activity, nonspecific. Multiple anterior abdominal soft tissue nodules are evident.  13 mm anterior left nodule shows no substantial hypermetabolic with an SUV of 1.7.  Calcified left lateral nodule with an SUV of 2.9.  2.2 cm calcified nodule lobe right paracolic gutter with an SUV of 2.5.  12/17/2020, right kidney mass biopsy pathology was reviewed and discussed with patient.  Pathology is consistent with RCC-demonstrates the morphology spectrum, papillary, solid and nested areas.  CD10 positive, racemase positive, CK7 negative.  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  Barbara Frank break apart FISH testing is pending.  #01/05/2021, addendum pathology Frank break-apart FISH testing was performed and a translocation  involving Xp11 / Frank transcription gene was not detected. HMB-45 IHC is negative  CAIX IHC demonstrates membranous staining.  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  MRI brain was obtained during the admission and was negative for acute process.  Patient mental status improved.  UTI was treated with IV antibiotics and switched to p.o. antibiotic upon discharge.  Patient  also was found to have rapid ventricular response with her atrial fibrillation.  Rate was treated with Cardizem drip during hospitalization because and switched to Toprol and Cardizem for rate control.  Barbara Frank for anticoagulation. Patient's axitinib was held during hospitalization.  There was plan for patient to restart axitinib upon discharge.  However since patient was sent to rehab, he is not able to be started on axitinib due to lack of access to her medication supply.  [facility]  Patient presents for post hospitalization follow-up.  She does not have much recollection of her recent admission.  Reports feeling well today.  He denies any fever, chills, nausea vomiting diarrhea.  03/07/2021, CT abdomen pelvis  Right renal mass slightly enlarged since 10/03/2020.  5.4 Barbara 4.9 Barbara 6.4 comparing to previously 5.0 Barbara 4.4 Barbara 6.Frank.  Stable adjacent right retroperitoneal lymphadenopathy.  There is a new 1.5 cm nodule in the right lower lobe.  Mild stranding around the right renal mass.  04/01/2022 resumed Axitinib 5mg 70m 05/06/2022 she presented for follow up, she stopped " all her medications". Tachycardia, hyperglycemia, she declined ER evaluation.  06/03/2022, presented for follow up.  Multiple no show to her CT scan. Palliative care service has not been able to reach her  06/23/2021, CT chest abdomen pelvis showed stable large right anterior renal mass.  Stable to slightly larger right-sided retroperitoneal adenopathy.  Waxing and waning pulmonary nodules.  2 lower lobe nodules resolved.  New right  middle lobe and right upper lobe nodules.  06/29/2022  Hold chemotherapy due to hyperglycemia.  Blood sugar was 431, due to noncompliance.  Recommend patient to go to emergency room for evaluation and she declines. talked to patient's PCP Olivia Mackie over the phone and discussed patient's case with her. Patient is not complaint with her medication. Her  diabetes was poorly controlled and glucose was extremely high, she refused to go to ER for management. She is also extremely depressed. Most recent CT scan shows stable disease. We both agree that patient is not able to take care of herself.  Olivia Mackie will try to get her to see psychiatrist for evaluation of her depression,she may benefit from living in a facility as well. From Oncology aspect, her prognosis is poor, and I will hold off additional treatment until her other acute medical conditions are stabilized. Hospice/palliative care is reasonable if her condition continues to deteriorate. Olivia Mackie will update me if she improves. .   07/23/2021 - 07/31/2021, hospitalized due to A. fib with RVR. 09/09/2021 - 09/14/2021, the patient due to A. fib with RVR, uncontrolled diabetes with glucose of 500s, DKA. 09/09/2021, CT angio chest PE protocol showed interval decrease of the right upper lobe nodularity.  No PE.  2/Frank/2023 , re-establish care, she expressed interests of resuming RCC treatments. She agrees with getting CT restaging. She reports being complaint with her medication.   10/28/21 CT showed progression of right renal mass, retroperitoneal lymphadenopathy. Stable small lung nodules. Cardiomegaly with biatrial dilatation. Aortic atherosclerosis. calcifications of the aortic valve, Dilatation of the pulmonic trunk   01/20/2022, CT chest abdomen pelvis with contrast showed heterogeneous mass of the anterior midportion of the right kidney is slightly increased in size, as is an adjacent right retroperitoneal lymph node.  Consistent with disease progression.  Findings are consistent with worsened renal cell carcinoma and adjacent nodal metastatic disease. New pulmonary nodule of the superior segment right lower lobe measuring 0.7 cm, highly suspicious for a small metastasis. Multiple additional tiny pulmonary nodules are unchanged and likely benign and incidental. Attention on follow-up. Coronary artery  disease.  7/Frank/2023-03/22/22   axitinib 5 mg twice daily. 03/22/2022-03/26/2022 patient was admitted due to altered mental status, delirium.  Urine culture was negative.  MRI brain with contrast was negative for metastasis or stroke.  CT abdomen pelvis and chest showed pulmonary nodules and existing renal mass. Due to concern of possible encephalopathy secondary to axitinib, axitinib was discontinued   INTERVAL HISTORY Barbara Frank is a 76 y.o. female who has above history reviewed by me today presents for follow up visit for management of metastatic renal clear-cell carcinoma Today she is accompanied by her friend [name]'s wife. Patient reports feeling well today.   Patient currently resides in [facility] assisted living. She reports tolerating cabozantinib 20 mg daily.  No nausea vomiting diarrhea.    Review of Systems  Constitutional:  Positive for fatigue. Negative for appetite change, chills and fever.  HENT:   Negative for hearing loss and voice change.   Eyes:  Negative for eye problems.  Respiratory:  Negative for chest tightness and cough.   Cardiovascular:  Negative for chest pain.  Gastrointestinal:  Negative for abdominal distention, abdominal pain, blood in stool, constipation and diarrhea.  Endocrine: Negative for hot flashes.  Genitourinary:  Negative for difficulty urinating and frequency.   Musculoskeletal:  Positive for back pain. Negative for arthralgias.  Skin:  Negative for itching and rash.  Neurological:  Negative for extremity weakness.  Hematological:  Negative for adenopathy.  Psychiatric/Behavioral:  Positive for depression. Negative for confusion.     MEDICAL HISTORY:  Past Medical History:  Diagnosis Date   A-fib ([place])    Depression    Diabetes mellitus without complication ([place])    History of kidney stones    Kidney mass     SURGICAL HISTORY: Past Surgical History:  Procedure Laterality Date   GASTRIC BYPASS     HERNIA REPAIR      TONSILLECTOMY     URETEROSCOPY WITH HOLMIUM LASER LITHOTRIPSY Left     SOCIAL HISTORY: Social History   Socioeconomic History   Marital status: Widowed    Spouse name: Not on file   Number of children: Not on file   Years of education: Not on file   Highest education level: Not on file  Occupational History   Not on file  Tobacco Use   Smoking status: Never   Smokeless tobacco: Never  Vaping Use   Vaping Use: Never used  Substance and Sexual Activity   Alcohol use: Not Currently   Drug use: Not Currently   Sexual activity: Not Currently  Other Topics Concern   Not on file  Social History Narrative   Not on file   Social Determinants of Health   Financial Resource Strain: Not on file  Food Insecurity: Not on file  Transportation Needs: Not on file  Physical Activity: Not on file  Stress: Not on file  Social Connections: Not on file  Intimate Partner Violence: Not on file    FAMILY HISTORY: Family History  Problem Relation Age of Onset   Breast cancer Mother    Bladder Cancer Neg Hx    Kidney cancer Neg Hx    Prostate cancer Neg Hx     ALLERGIES:  is allergic to kiwi extract.  MEDICATIONS:  Current Outpatient Medications  Medication Sig Dispense Refill   apixaban (Barbara Frank) 5 MG TABS tablet Take 1 tablet (5 mg total) by mouth 2 (two) times daily. 60 tablet 2   cetirizine (ZYRTEC) 10 MG tablet Take 0.5 tablets (5 mg total) by mouth daily. 15 tablet 0   diltiazem (CARDIZEM CD) 240 MG 24 hr capsule Take 1 capsule (240 mg total) by mouth every evening. 30 capsule 2   escitalopram (LEXAPRO) 10 MG tablet Take 10 mg by mouth daily.     insulin aspart (NOVOLOG) 100 UNIT/ML injection Inject 0-15 Units into the skin Frank (three) times daily with meals. CBG 70 - 120: 0 units,  121 - 150: 2 units, 151 - 200: Frank units,  201 - 250: 5 units,  251 - 300: 8 units, 301 - 350: 11 units 351 - 400: 15 units, CBG > 400: call MD and obtain STAT lab verification 10 mL 11   insulin  glargine (LANTUS) 100 UNIT/ML injection Inject 0.25 mLs (25 Units total) into the skin 2 (two) times daily. 10 mL 11   metFORMIN (GLUCOPHAGE-XR) 500 MG 24 hr tablet Take 1 tablet (500 mg total) by mouth 2 (two) times daily. (Patient taking differently: Take 1,000 mg by mouth daily.) 60 tablet 2   metoprolol succinate (TOPROL-XL) 25 MG 24 hr tablet Take 25 mg by mouth daily.     cabozantinib (CABOMETYX) 20 MG tablet Take 2 tablets (40 mg total) by mouth daily. Take on an empty stomach, 1 hour before or 2 hours after meals. 60 tablet 0   cyanocobalamin (VITAMIN B12) 1000 MCG/ML injection Inject into the muscle. (Patient not taking: Reported on  04/28/2022)     cyanocobalamin 1000 MCG tablet Take 1,000 mcg by mouth daily. (Patient not taking: Reported on 04/28/2022)     nystatin (MYCOSTATIN/NYSTOP) powder Apply 1 Application topically 2 (two) times daily. Apply to abdominal fold and groin in the mornings  Applyunder both breasts at night (Patient not taking: Reported on 04/28/2022)     polyethylene glycol (MIRALAX / GLYCOLAX) 17 g packet Take 17 g by mouth daily. (Patient not taking: Reported on 04/28/2022)     No current facility-administered medications for this visit.     PHYSICAL EXAMINATION: ECOG PERFORMANCE STATUS: 2 - Symptomatic, <50% confined to bed Vitals:   05/19/22 1413  BP: 131/80  Pulse: 72  Resp: 20  Temp: 98.7 F (37.1 C)  SpO2: 100%   Filed Weights   05/19/22 1413  Weight: 157 lb 1.6 oz (71.Frank kg)    Physical Exam Constitutional:      General: She is not in acute distress.    Comments: Frail elderly female, she ambulates with a walker.   HENT:     Head: Normocephalic and atraumatic.  Eyes:     General: No scleral icterus. Cardiovascular:     Rate and Rhythm: Normal rate. Rhythm irregular.     Heart sounds: Normal heart sounds.  Pulmonary:     Effort: Pulmonary effort is normal. No respiratory distress.     Breath sounds: No wheezing.  Abdominal:     General: Bowel  sounds are normal. There is no distension.     Palpations: Abdomen is soft.  Musculoskeletal:        General: No deformity. Normal range of motion.     Cervical back: Normal range of motion and neck supple.  Skin:    General: Skin is warm and dry.     Findings: No erythema or rash.  Neurological:     Mental Status: She is alert and oriented to person, place, and time.     Cranial Nerves: No cranial nerve deficit.     Coordination: Coordination normal.     LABORATORY DATA:  I have reviewed the data as listed    Latest Ref Rng & Units 05/19/2022    1:51 PM 04/28/2022    1:07 PM 04/06/2022    1:17 PM  CBC  WBC 4.0 - 10.5 K/uL 5.Frank  6.0  5.5   Hemoglobin 12.0 - 15.0 g/dL 13.4  12.2  12.4   Hematocrit 36.0 - 46.0 % 42.0  39.7  40.4   Platelets 150 - 400 K/uL 198  254  326       Latest Ref Rng & Units 05/19/2022    1:51 PM 04/28/2022    1:07 PM 04/06/2022    1:17 PM  CMP  Glucose 70 - 99 mg/dL 277  111  139   BUN 8 - 23 mg/dL '10  19  19   ' Creatinine 0.44 - 1.00 mg/dL 0.60  0.61  0.59   Sodium 135 - 145 mmol/L 135  140  139   Potassium Frank.5 - 5.1 mmol/L Frank.8  Frank.8  4.2   Chloride 98 - 111 mmol/L 103  106  106   CO2 22 - 32 mmol/L '27  27  27   ' Calcium 8.9 - 10.Frank mg/dL 9.8  10.2  9.9   Total Protein 6.5 - 8.1 g/dL 6.4  6.5  6.0   Total Bilirubin 0.Frank - 1.2 mg/dL 0.4  0.4  0.4   Alkaline Phos 38 - 126 U/L 172  103  84   AST 15 - 41 U/L '24  14  17   ' ALT 0 - 44 U/L '20  11  10        ' RADIOGRAPHIC STUDIES: I have personally reviewed the radiological images as listed and agreed with the findings in the report. No results found.

## 2022-05-19 NOTE — Telephone Encounter (Signed)
CBC with Differential/Platelet Order: [id number] Status: Final result    Visible to patient: No (inaccessible in MyChart)    Next appt: Today at 02:00 PM in Oncology ([facility])    Dx: Encounter for antineoplastic immunoth...    0 Result Notes           Component Ref Range & Units 3 wk ago (04/28/22) 1 mo ago (04/06/22) 1 mo ago (03/23/22) 1 mo ago (03/22/22) 2 mo ago (03/16/22) 2 mo ago (03/04/22) 3 mo ago (01/28/22)  WBC 4.0 - 10.5 K/uL 6.0  5.5  7.9  9.7  7.8  5.9  6.0   RBC 3.87 - 5.11 MIL/uL 4.62  4.68  5.19 High   5.37 High   5.34 High   4.46  4.76   Hemoglobin 12.0 - 15.0 g/dL 12.2  12.4  13.6  13.7  14.2  11.7 Low   12.7   HCT 36.0 - 46.0 % 39.7  40.4  42.8  43.9  44.4  38.2  39.9   MCV 80.0 - 100.0 fL 85.9  86.3  82.5  81.8  83.1  85.7  83.8   MCH 26.0 - 34.0 pg 26.4  26.5  26.2  25.5 Low   26.6  26.2  26.7   MCHC 30.0 - 36.0 g/dL 30.7  30.7  31.8  31.2  32.0  30.6  31.8   RDW 11.5 - 15.5 % 15.1  13.9  13.1  13.2  13.2  13.4  14.2   Platelets 150 - 400 K/uL 254  326  202  224  245  214  208   nRBC 0.0 - 0.2 % 0.0  0.0  0.0 CM  0.0 CM  0.0  0.0  0.0   Neutrophils Relative % % 65  62    63  66  69   Neutro Abs 1.7 - 7.7 K/uL 3.9  3.5    4.8  3.9  4.2   Lymphocytes Relative % '20  26    25  21  21   '$ Lymphs Abs 0.7 - 4.0 K/uL 1.2  1.4    2.0  1.2  1.3   Monocytes Relative % '10  7    9  9  7   '$ Monocytes Absolute 0.1 - 1.0 K/uL 0.6  0.4    0.7  0.6  0.4   Eosinophils Relative % '3  3    2  2  1   '$ Eosinophils Absolute 0.0 - 0.5 K/uL 0.2  0.1    0.2  0.1  0.1   Basophils Relative % '1  1    1  1  1   '$ Basophils Absolute 0.0 - 0.1 K/uL 0.0  0.0    0.0  0.0  0.0   Immature Granulocytes % 1  1    0  1  1   Abs Immature Granulocytes 0.00 - 0.07 K/uL 0.04  0.03 CM    0.03 CM  0.04 CM  0.04 CM   Comment: Performed at [facility], [address] [place], [place] [address]  Resulting Agency  [facility] LAB [place] [facility] [place] [facility] [place] [facility] [place] [facility] [place] [facility] LAB          Specimen Collected: 04/28/22 13:07 Last Resulted: 04/28/22 13:19      Lab Flowsheet    Order Details    View Encounter    Lab and Collection Details    Routing  Result History    View All Conversations on this Encounter      CM=Additional comments      Result Care Coordination   Patient Communication   Add Comments   Not seen Back to Top       Other Results from 04/28/2022  Lactate dehydrogenase Order: [id number] Status: Final result    Visible to patient: No (inaccessible in MyChart)    Next appt: Today at 02:00 PM in Oncology ([facility])    Dx: Renal cell carcinoma of right kidney ...    0 Result Notes       Component Ref Range & Units 3 wk ago 2 mo ago 1 yr ago  LDH 98 - 192 U/L 102  107 CM  92 Low  CM   Comment: Performed at [facility], [address] [place], [place] [address]  Resulting Agency  [facility] CLIN LAB [facility] LAB         Specimen Collected: 04/28/22 13:07 Last Resulted: 04/28/22 13:29      Lab Flowsheet    Order Details    View Encounter    Lab and Collection Details    Routing    Result History    View All Conversations on this Encounter      CM=Additional comments      Result Care Coordination   Patient Communication   Add Comments   Not seen Back to Top          Contains abnormal data Comprehensive metabolic panel Order: [id number] Status: Final result    Visible to patient: No (inaccessible in MyChart)    Next appt: Today at 02:00 PM in Oncology ([facility])    Dx: Encounter for antineoplastic immunoth...    0 Result Notes           Component Ref Range & Units 3 wk ago (04/28/22) 1 mo ago (04/06/22) 1 mo ago (03/25/22) 1 mo ago (03/24/22) 1 mo ago (03/23/22) 1 mo ago (03/22/22) 1 mo ago (03/22/22)  Sodium 135 - 145 mmol/L 140  139  138   139   135   Potassium 3.5 - 5.1 mmol/L 3.8  4.2  3.8   3.3 Low    3.7   Chloride 98 - 111 mmol/L 106  106  105   105   102   CO2 22 - 32 mmol/L '27  27  30   29   26    '$ Glucose, Bld 70 - 99 mg/dL 111 High   139 High  CM  123 High  CM   170 High  CM   302 High  CM   Comment: Glucose reference range applies only to samples taken after fasting for at least 8 hours.  BUN 8 - 23 mg/dL '19  19  18   16   23   '$ Creatinine, Ser 0.44 - 1.00 mg/dL 0.61  0.59  0.57   0.48   0.59   Calcium 8.9 - 10.3 mg/dL 10.2  9.9  10.1   9.9   10.3   Total Protein 6.5 - 8.1 g/dL 6.5  6.0 Low    6.1 Low    6.8    Albumin 3.5 - 5.0 g/dL 3.2 Low   2.9 Low    2.9 Low    3.2 Low     AST 15 - 41 U/L 14 Low   '17   19   17    '$ ALT 0 -  44 U/L '11  10   11   12    '$ Alkaline Phosphatase 38 - 126 U/L 103  84   94   131 High     Total Bilirubin 0.3 - 1.2 mg/dL 0.4  0.4   0.6   0.7    GFR, Estimated >60 mL/min >60  >60 CM  >60 CM   >60 CM   >60 CM   Comment: (NOTE)  Calculated using the CKD-EPI Creatinine Equation (2021)   Anion gap 5 - '15 7  6 '$ CM  3 Low  CM   5 CM   7 CM   Comment: Performed at [facility], [address] [place],  [address]  Resulting Agency  [facility] CL[facility] LAB [place] [facility] LAB [place] CL[facility] LAB [place] [facility] [place] [facility] [place] [facility] [place] [facility]         Specimen Collected: 04/28/22 13:07 Last Resulted: 04/28/22 13:29

## 2022-05-19 NOTE — Assessment & Plan Note (Signed)
Treatment plan as listed above. 

## 2022-05-19 NOTE — Assessment & Plan Note (Signed)
Right RCC  with retroperitoneal lymphadenopathy, lung mass and peritoneal nodularity Progressed on 1st line treatment of Keytruda Did not tolerate 2nd line treatment of Axitinib '5mg'$  BID due to  encephalopathy Patient and POA are interested in other alternatives. Labs reviewed and discussed with patient.  She tolerates cabozantinib, 20 mg daily, recommend to increase to 40 mg

## 2022-05-20 ENCOUNTER — Other Ambulatory Visit (HOSPITAL_COMMUNITY): Payer: Self-pay

## 2022-05-20 MED ORDER — CABOMETYX 40 MG PO TABS
40.0000 mg | ORAL_TABLET | Freq: Every day | ORAL | 0 refills | Status: DC
  Filled 2022-05-20: qty 30, 30d supply, fill #0

## 2022-05-21 ENCOUNTER — Other Ambulatory Visit (HOSPITAL_COMMUNITY): Payer: Self-pay

## 2022-06-01 ENCOUNTER — Inpatient Hospital Stay: Payer: Medicare PPO

## 2022-06-01 NOTE — Progress Notes (Addendum)
Nutrition Assessment   Reason for Assessment:   Referral from [name] regarding poor appetite   ASSESSMENT:   76 year old female with [place] currently on cobometyx (04/29/22). Treatment switched due to intolerant.  Past medical history of afib, depression, DM.  Received phone call from caregiver, [name] wanting to learn more about nutrition appointment scheduled for tomorrow. Patient living a facility and has little control over meals.     [name] reports that patient is living at [facility] independent living.  He has purchased her a dorm size refrigerator and keeps her stocked with diet pepsi, snacks, meat and cheese trays, lunch meats, peanut butter nabs.  Says that patient has been eating 3 meals per day served by facility.  They do not have choice of meals.  He has encouraged patient to eat meals given and eat snacks between meals.    Hospital admission noted in 03/22/22   Medications: Vit b 12, aspart, lantus, metformin, miralax,   Labs: glucose 277, albumin 3.1   Anthropometrics:   Height: 62 inches Weight: 157 lb 161 lb on 04/28/22 159 lb 8/1 168 lb on 6/29 BMI: 28  6% weight loss in the last 3 months, concerning   Estimated Energy Needs  Kcals: 1775-2100 Protein: 88-105 g Fluid: > 1775 ml   NUTRITION DIAGNOSIS: Inadequate oral intake related to chronic illness as evidenced by 6% weight loss in the last 3 months.   INTERVENTION:  Discussed importance of nutrition and weight maintenance.  Encouraged good sources of protein and adequate calories.   Discussed options for snacks to keep on hand.   Discussed low sugar oral nutrition supplements (premier protein, glucerna, ensure max protein and boost max protein).  Encouraged bringing in foods patient will eat if does not like a meal provided by facility.   Encouraged caregiver to ask staff what percentage of meals patient is eating and keep note of weight trends.   In person appointment cancelled for tomorrow  and phone visit completed for today.  Contact phone number given to caregiver and encouraged to call with questions or concerns.     Next Visit:  No follow-up but RD available if needed  Yenty Bloch B. Zenia Resides, [place], [place] Registered Dietitian [phone]

## 2022-06-02 ENCOUNTER — Inpatient Hospital Stay: Payer: Medicare PPO

## 2022-06-04 ENCOUNTER — Emergency Department: Payer: Medicare PPO

## 2022-06-04 ENCOUNTER — Non-Acute Institutional Stay: Payer: Medicare PPO | Admitting: Student

## 2022-06-04 ENCOUNTER — Emergency Department
Admission: EM | Admit: 2022-06-04 | Discharge: 2022-06-04 | Disposition: A | Discharge status: Skilled Nursing Facility | Payer: Medicare PPO | Attending: Emergency Medicine | Admitting: Emergency Medicine

## 2022-06-04 DIAGNOSIS — R930 Abnormal findings on diagnostic imaging of skull and head, not elsewhere classified: Secondary | ICD-10-CM | POA: Insufficient documentation

## 2022-06-04 DIAGNOSIS — Z7901 Long term (current) use of anticoagulants: Secondary | ICD-10-CM | POA: Insufficient documentation

## 2022-06-04 DIAGNOSIS — S8011XA Contusion of right lower leg, initial encounter: Secondary | ICD-10-CM

## 2022-06-04 DIAGNOSIS — S80811A Abrasion, right lower leg, initial encounter: Secondary | ICD-10-CM | POA: Insufficient documentation

## 2022-06-04 DIAGNOSIS — Y92019 Unspecified place in single-family (private) house as the place of occurrence of the external cause: Secondary | ICD-10-CM | POA: Diagnosis not present

## 2022-06-04 DIAGNOSIS — W19XXXA Unspecified fall, initial encounter: Secondary | ICD-10-CM

## 2022-06-04 DIAGNOSIS — Z515 Encounter for palliative care: Secondary | ICD-10-CM

## 2022-06-04 DIAGNOSIS — R21 Rash and other nonspecific skin eruption: Secondary | ICD-10-CM

## 2022-06-04 DIAGNOSIS — E119 Type 2 diabetes mellitus without complications: Secondary | ICD-10-CM | POA: Insufficient documentation

## 2022-06-04 DIAGNOSIS — I4891 Unspecified atrial fibrillation: Secondary | ICD-10-CM | POA: Insufficient documentation

## 2022-06-04 DIAGNOSIS — W06XXXA Fall from bed, initial encounter: Secondary | ICD-10-CM | POA: Insufficient documentation

## 2022-06-04 DIAGNOSIS — C641 Malignant neoplasm of right kidney, except renal pelvis: Secondary | ICD-10-CM

## 2022-06-04 DIAGNOSIS — S8991XA Unspecified injury of right lower leg, initial encounter: Secondary | ICD-10-CM | POA: Diagnosis present

## 2022-06-04 NOTE — ED Provider Notes (Signed)
[facility] Provider Note    Event Date/Time   First MD Initiated Contact with Patient 06/04/22 1340     (approximate)  History   Chief Complaint: Leg Injury (Right leg abrasion /pain , mech fall , [facility] axox4)  HPI  Barbara Frank is a 76 y.o. female with a past medical history of diabetes, A-fib on Eliquis, presents to the emergency department after a fall.  According to the patient she was trying to reach her walker from the edge of the bed and was just out of reach.  Attempted to stand up to get the walker and fell.  Patient is complaining of some pain to her right calf where she has an abrasion.  Does not believe she hit her head.  Does not have any other complaints.  Physical Exam   Triage Vital Signs: ED Triage Vitals [06/04/22 1327]  Enc Vitals Group     BP (!) 155/78     Pulse Rate 88     Resp 17     Temp 98 F (36.7 C)     Temp Source Oral     SpO2 98 %     Weight 156 lb 8.4 oz (71 kg)     Height '5\' 2"'$  (1.575 m)     Head Circumference      Peak Flow      Pain Score      Pain Loc      Pain Edu?      Excl. in [place]?     Most recent vital signs: Vitals:   06/04/22 1327  BP: (!) 155/78  Pulse: 88  Resp: 17  Temp: 98 F (36.7 C)  SpO2: 98%    General: Awake, no distress.  CV:  Good peripheral perfusion.  Regular rate and rhythm  Resp:  Normal effort.  Equal breath sounds bilaterally.  Abd:  No distention.  Soft, nontender.  No rebound or guarding. Other:  Small abrasion to the right upper calf which is tender to this area but no tenderness over the tibia good range of motion in all joints of the leg.  Good range of motion of both hips without any pain.  Good range of motion both upper extremities without pain elicited.   ED Results / Procedures / Treatments   RADIOLOGY  I have reviewed the tibia x-ray.  On my interpretation of the images I do not see any fracture. X-rays negative for fracture.  Positive for small amount  of swelling possible hematoma. CT scan of the head is negative for acute abnormality CT cervical spine negative.  MEDICATIONS ORDERED IN ED: Medications - No data to display   IMPRESSION / MDM / [place] / ED COURSE  I reviewed the triage vital signs and the nursing notes.  Patient's presentation is most consistent with acute presentation with potential threat to life or bodily function.  Patient presents emergency department after a fall at her nursing facility.  Patient does have some pain to her right upper calf consistent with an abrasion but no fracture noted on x-ray.  Patient does have a history of atrial fibrillation and is on Eliquis.  Although the patient does not believe she hit her head at the patient is on Eliquis due to shear forces we will obtain a CT image of the head and C-spine as a precaution to rule out injury.  Patient agreeable to plan of care.  Patient continues to appear well, CT scan head is  negative as well.  As the patient continues to appear well with no significant findings we will discharge the patient have her follow-up with her doctor.  Patient agreeable to plan of care.  FINAL CLINICAL IMPRESSION(S) / ED DIAGNOSES   Fall Abrasion   Note:  This document was prepared using Dragon voice recognition software and may include unintentional dictation errors.   Harvest Dark, MD 06/04/22 1433

## 2022-06-04 NOTE — Progress Notes (Signed)
[facility] Palliative [facility] Consult Note Telephone: [phone]  Fax: [phone]    Date of encounter: 06/04/22 6:56 PM PATIENT NAME: Barbara Frank [place] [id number]   [phone] (home)  DOB: 1945-12-28 MRN: [id number] PRIMARY CARE PROVIDER:    Virgie Dad, [place],  [address] [place] [address] [phone]  REFERRING PROVIDER:   Virgie Dad, [address] [place],  [place] [address] [phone]  RESPONSIBLE PARTY:    Contact Information     Name Relation Home Work [place] Other   [phone]        I met face to face with patient in the facility. Palliative Care was asked to follow this patient by consultation request of  Virgie Dad, FNP to address advance care planning and complex medical decision making. This is a follow up visit.                                   ASSESSMENT AND PLAN / RECOMMENDATIONS:   Advance Care Planning/Goals of Care: Goals include to maximize quality of life and symptom management. Patient/health care surrogate gave his/her permission to discuss. CODE STATUS: DNR  Education provided on Palliative Medicine. Will continue to provide supportive care, symptom management as needed.   Symptom Management/Plan:  Renal cell carcinoma-disease progression noted. Patient to continue chemotherapy. She is to follow up with oncology as scheduled.   Fall-patient s/p unwitnessed fall. She has difficulty bearing weight, large hematoma to right calf. She is being sent to ED for evaluation. Patient is on Eliquis.   Rash to groin, abdominal folds, under breast-recommend nystatin powder BID, fluconazole. Will notify PCP for orders.   Follow up Palliative Care Visit: Palliative care will continue to follow for complex medical decision making, advance care planning, and clarification of goals. Return in 4 weeks or prn.   This visit was  coded based on medical decision making (MDM).  PPS: 50%  HOSPICE ELIGIBILITY/DIAGNOSIS: TBD  Chief Complaint: Palliative Medicine follow up visit.   HISTORY OF PRESENT ILLNESS:  Barbara Frank is a 76 y.o. year old female  with renal cell carcinoma, atrial fibrillation-on Eliquis, T2DM, depression.  Patient now resides at [facility]. Patient is observed lying on floor upon arrival. She states she slipped on off the bed when she was attempted to reach her rollator which was across the room. She states she hit her leg when going down. She has small skin tear to right medial calf. She denies hitting her head. Staff member x 2 stood patient up, she had difficulty bearing weight and c/o severe pain to right leg. Patient has large hematoma to right medial calf. Area is tender to palpation. She has denies pain otherwise. Patient has rash, erythema and bleeding to groin. She also has rash under bilateral breast. Staff report patient refusing shower recently. Staff report patient tries to be as independent as possible.   History obtained from review of EMR, discussion with primary team, and interview with family, facility staff/caregiver and/or Ms. Loomis.  I reviewed available labs, medications, imaging, studies and related documents from the EMR.  Records reviewed and summarized above.   ROS  A 10-point ROS is negative, except for the pertinent positives and negatives detailed per the HPI.   Physical Exam: Constitutional: NAD General: frail appearing EYES: anicteric sclera, lids intact, no  discharge  ENMT: intact hearing, oral mucous membranes moist CV: S1S2, RRR, no LE edema Pulmonary: LCTA, no increased work of breathing, no cough, room air Abdomen: normo-active BS + 4 quadrants, soft and non tender GU: deferred MSK: moves all extremities, ambulatory Skin: warm and dry, rash to groin, abdominal fold, under bilateral breast Neuro: +generalized weakness,  A & O x 2,  forgetful Psych: non-anxious affect Hem/lymph/immuno: no widespread bruising   Thank you for the opportunity to participate in the care of Ms. [place].  The palliative care team will continue to follow. Please call our office at [phone] if we can be of additional assistance.   Ezekiel Slocumb, NP   COVID-19 PATIENT SCREENING TOOL Asked and negative response unless otherwise noted:   Have you had symptoms of covid, tested positive or been in contact with someone with symptoms/positive test in the past 5-10 days? No

## 2022-06-04 NOTE — ED Triage Notes (Signed)
Right leg abrasion /pain , mech fall , [facility] axox4

## 2022-06-04 NOTE — ED Notes (Signed)
[facility]  CALLED  FOR  TRANSPORT   TO  [facility] 

## 2022-06-09 ENCOUNTER — Inpatient Hospital Stay: Payer: Medicare PPO | Attending: Oncology

## 2022-06-09 ENCOUNTER — Inpatient Hospital Stay (HOSPITAL_BASED_OUTPATIENT_CLINIC_OR_DEPARTMENT_OTHER): Payer: Medicare PPO | Admitting: Oncology

## 2022-06-09 ENCOUNTER — Encounter: Payer: Self-pay | Admitting: Oncology

## 2022-06-09 VITALS — BP 148/79 | HR 76 | Temp 97.9°F | Resp 18 | Wt 154.4 lb

## 2022-06-09 DIAGNOSIS — Z7901 Long term (current) use of anticoagulants: Secondary | ICD-10-CM | POA: Diagnosis not present

## 2022-06-09 DIAGNOSIS — F32A Depression, unspecified: Secondary | ICD-10-CM | POA: Insufficient documentation

## 2022-06-09 DIAGNOSIS — R5382 Chronic fatigue, unspecified: Secondary | ICD-10-CM | POA: Diagnosis not present

## 2022-06-09 DIAGNOSIS — E1165 Type 2 diabetes mellitus with hyperglycemia: Secondary | ICD-10-CM | POA: Insufficient documentation

## 2022-06-09 DIAGNOSIS — Z794 Long term (current) use of insulin: Secondary | ICD-10-CM | POA: Diagnosis not present

## 2022-06-09 DIAGNOSIS — Z5111 Encounter for antineoplastic chemotherapy: Secondary | ICD-10-CM

## 2022-06-09 DIAGNOSIS — E119 Type 2 diabetes mellitus without complications: Secondary | ICD-10-CM | POA: Diagnosis not present

## 2022-06-09 DIAGNOSIS — Z79899 Other long term (current) drug therapy: Secondary | ICD-10-CM | POA: Diagnosis not present

## 2022-06-09 DIAGNOSIS — I4891 Unspecified atrial fibrillation: Secondary | ICD-10-CM | POA: Insufficient documentation

## 2022-06-09 DIAGNOSIS — C641 Malignant neoplasm of right kidney, except renal pelvis: Secondary | ICD-10-CM | POA: Insufficient documentation

## 2022-06-09 LAB — COMPREHENSIVE METABOLIC PANEL
ALT: 26 U/L (ref 0–44)
AST: 29 U/L (ref 15–41)
Albumin: 3.2 g/dL — ABNORMAL LOW (ref 3.5–5.0)
Alkaline Phosphatase: 132 U/L — ABNORMAL HIGH (ref 38–126)
Anion gap: 6 (ref 5–15)
BUN: 13 mg/dL (ref 8–23)
CO2: 28 mmol/L (ref 22–32)
Calcium: 9.8 mg/dL (ref 8.9–10.3)
Chloride: 103 mmol/L (ref 98–111)
Creatinine, Ser: 0.63 mg/dL (ref 0.44–1.00)
GFR, Estimated: >60 mL/min (ref >60)
Glucose, Bld: 351 mg/dL — ABNORMAL HIGH (ref 70–99)
Potassium: 4.5 mmol/L (ref 3.5–5.1)
Sodium: 137 mmol/L (ref 135–145)
Total Bilirubin: 0.3 mg/dL (ref 0.3–1.2)
Total Protein: 6.3 g/dL — ABNORMAL LOW (ref 6.5–8.1)

## 2022-06-09 LAB — CBC WITH DIFFERENTIAL/PLATELET
Abs Immature Granulocytes: 0.02 10*3/uL (ref 0.00–0.07)
Basophils Absolute: 0 10*3/uL (ref 0.0–0.1)
Basophils Relative: 0 %
Eosinophils Absolute: 0.1 10*3/uL (ref 0.0–0.5)
Eosinophils Relative: 3 %
HCT: 42.1 % (ref 36.0–46.0)
Hemoglobin: 13.7 g/dL (ref 12.0–15.0)
Immature Granulocytes: 0 %
Lymphocytes Relative: 37 %
Lymphs Abs: 1.8 10*3/uL (ref 0.7–4.0)
MCH: 26.6 pg (ref 26.0–34.0)
MCHC: 32.5 g/dL (ref 30.0–36.0)
MCV: 81.7 fL (ref 80.0–100.0)
Monocytes Absolute: 0.4 10*3/uL (ref 0.1–1.0)
Monocytes Relative: 8 %
Neutro Abs: 2.6 10*3/uL (ref 1.7–7.7)
Neutrophils Relative %: 52 %
Platelets: 171 10*3/uL (ref 150–400)
RBC: 5.15 MIL/uL — ABNORMAL HIGH (ref 3.87–5.11)
RDW: 15.9 % — ABNORMAL HIGH (ref 11.5–15.5)
WBC: 4.9 10*3/uL (ref 4.0–10.5)
nRBC: 0 % (ref 0.0–0.2)

## 2022-06-09 NOTE — Assessment & Plan Note (Signed)
Treatment plan as listed above. 

## 2022-06-09 NOTE — Assessment & Plan Note (Signed)
Uncontrolled. Continue follow-up with primary care provider 

## 2022-06-09 NOTE — Assessment & Plan Note (Signed)
Right RCC  with retroperitoneal lymphadenopathy, lung mass and peritoneal nodularity Progressed on 1st line Keytruda, 2nd line Axitinib '5mg'$  BID- encephalopathy Labs reviewed and discussed with patient.  She tolerates cabozantinib, 40 mg daily Continue current regimen

## 2022-06-09 NOTE — Progress Notes (Signed)
Hematology/Oncology Progress note [phone] [phone]      Patient Care Team: Virgie Dad, FNP as PCP - General (Family Medicine) [name], MD as Consulting Physician (Hematology and Oncology)    ASSESSMENT & PLAN:   Cancer Staging  Renal cell carcinoma [facility]) Staging form: Kidney, AJCC 8th Edition - Clinical stage from 12/24/2020: Stage IV (cT1b, cN1, cM1) - Signed by [name], MD on 12/24/2020   Renal cell carcinoma ([place]) Right RCC  with retroperitoneal lymphadenopathy, lung mass and peritoneal nodularity Progressed on 1st line Keytruda, 2nd line Axitinib 50m BID- encephalopathy Labs reviewed and discussed with patient.  She tolerates cabozantinib, 40 mg daily Continue current regimen  Encounter for antineoplastic chemotherapy Treatment plan as listed above  Type 2 diabetes mellitus without complication, with long-term current use of insulin (HCC) Uncontrolled. Continue follow-up with primary care provider    Orders Placed This Encounter  Procedures   CBC with Differential/Platelet    Standing Status:   Future    Standing Expiration Date:   06/10/2023   Comprehensive metabolic panel    Standing Status:   Future    Standing Expiration Date:   06/09/2023   TSH    Standing Status:   Future    Standing Expiration Date:   06/10/2023   Follow-up in 4 weeks All questions were answered. The patient knows to call the clinic with any problems, questions or concerns.  ZEarlie Server MD, PhD [facility] 06/09/2022   CHIEF COMPLAINTS/REASON FOR VISIT:  Metastatic RCC  HISTORY OF PRESENTING ILLNESS:   Patient is a poor historian. Her mood appears to be depressed.  She currently lives at assisted living facility. [place] [facility] Her power of attorney is JDrucie Ipwho is her husband's niece. JAlmyra Freewas called during this encounter.   Extensive medical record review was performed. She has had multiple images done  during the past few months, 05/27/20 CT head wo contrast- no acute intracranial abnormality.  06/01/20 CT abdomen and pelvis with contrast 4 mm proximal left ureteral calculus causing mild proximal left- sided hydroureteronephrosis with periureteric and perinephric stranding on the left.5.2cm enhancing right mid anterior kidney mass. Mildly enlarged right retroperitoneal retrocaval lymph nodes suspicious for nodal metastases Mild cardiomegaly, Small hiatal hernia. Status post gastric bypass surgery and cholecystectomy. Postoperative changes anterior abdominal wall. 06/05/20 CT head wo contrast - No acute intracranial abnormality. Probable tiny right frontal scalp Contusion. 2. Mild global volume loss and minimal chronic ischemic small vessel disease. 06/06/20 CT abdomen pelvis wo contrast 1. Indeterminate lesion in the right kidney which is stable may be a solid mass., 2. Partially calcified uterine fibroids. 3. Left UVJ calculus measuring 5 mm 06/07/20 CT head wo contrast.  1. No acute intracranial abnormality. No significant change. 2. Intracranial atherosclerosis, atrophy and chronic small vessel ischemic changes. 3. Chronic/subacute right maxillary sinus disease.  06/07/20 Cystoscopy by DEverardo BealsAArnell Sievingfor Ureteroscopic stone extraction Admitted on 10/3/21due to failure to thrive,  anorexia, dehydration, electrolyte imbalances.   06/09/20 CT abdomen and chest with contrast 1. Right renal mass is stable in size. Most suggestive of a renal neoplasm. 2. Right retroperitoneal lymph nodes are stable in size; may well be metastatic. 3. Left hydronephrosis and hydroureter is stable. Pelvis is not imaged therefore if there is a distal calculus still present, it is not visible. 4. Sclerotic lesions in the anterior left seventh, eighth and ninth ribs which given distribution are most likely healed rib fractures.  She is on Eliquis  2m Bid for atrial fibrillation. She reports not taking anti depressants  that were given by her PCP.  #11/04/2020, PET scan was obtained. PET scan showed interval increasing size of right upper lobe pulmonary lesion, now 3 cm with an SUV of 3.8.  Associated with a new right lower lobe nodule 8 mm.  5.1 cm right renal mass hypermetabolic activity.  1.9 retrocaval lymph node with SUV of 4.8.  Second index retrocaval node with SUV of 2.  Nodular disease seen in the duodenum on previous study is not well demonstrated on noncontrast CT image today.  Some nodular FDG activity, nonspecific. Multiple anterior abdominal soft tissue nodules are evident.  13 mm anterior left nodule shows no substantial hypermetabolic with an SUV of 1.7.  Calcified left lateral nodule with an SUV of 2.9.  2.2 cm calcified nodule lobe right paracolic gutter with an SUV of 2.5.  12/17/2020, right kidney mass biopsy pathology was reviewed and discussed with patient.  Pathology is consistent with RCC-demonstrates the morphology spectrum, papillary, solid and nested areas.  CD10 positive, racemase positive, CK7 negative.  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  TFE 3 break apart FISH testing is pending.  #01/05/2021, addendum pathology TFE3 break-apart FISH testing was performed and a translocation  involving Xp11 / TFE3 transcription gene was not detected. HMB-45 IHC is negative  CAIX IHC demonstrates membranous staining.  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  MRI brain was obtained during the admission and was negative for acute process.  Patient mental status improved.  UTI was treated with IV antibiotics and switched to p.o. antibiotic upon discharge.  Patient also was found to  have rapid ventricular response with her atrial fibrillation.  Rate was treated with Cardizem drip during hospitalization because and switched to Toprol and Cardizem for rate control.  Eliquis for anticoagulation. Patient's axitinib was held during hospitalization.  There was plan for patient to restart axitinib upon discharge.  However since patient was sent to rehab, he is not able to be started on axitinib due to lack of access to her medication supply.  [facility]  Patient presents for post hospitalization follow-up.  She does not have much recollection of her recent admission.  Reports feeling well today.  He denies any fever, chills, nausea vomiting diarrhea.  03/07/2021, CT abdomen pelvis  Right renal mass slightly enlarged since 10/03/2020.  5.4 x 4.9 x 6.4 comparing to previously 5.0 x 4.4 x 6.3.  Stable adjacent right retroperitoneal lymphadenopathy.  There is a new 1.5 cm nodule in the right lower lobe.  Mild stranding around the right renal mass.  04/01/2022 resumed Axitinib 5mg 75m 05/06/2022 she presented for follow up, she stopped " all her medications". Tachycardia, hyperglycemia, she declined ER evaluation.  06/03/2022, presented for follow up.  Multiple no show to her CT scan. Palliative care service has not been able to reach her  06/23/2021, CT chest abdomen pelvis showed stable large right anterior renal mass.  Stable to slightly larger right-sided retroperitoneal adenopathy.  Waxing and waning pulmonary nodules.  2 lower lobe nodules resolved.  New right middle lobe and right upper lobe nodules.  06/29/2022  Hold chemotherapy due to hyperglycemia.  Blood sugar was 431, due to noncompliance.  Recommend patient to go to emergency room for evaluation and she declines. talked to patient's PCP Olivia Mackie over the phone and discussed patient's case with her. Patient is not complaint with her medication. Her diabetes was poorly  controlled and glucose was extremely high, she refused to go to ER for management. She is also extremely depressed. Most recent CT scan shows stable disease. We both agree that patient is not able to take care of herself.  Olivia Mackie will try to get her to see psychiatrist for evaluation of her depression,she may benefit from living in a facility as well. From Oncology aspect, her prognosis is poor, and I will hold off additional treatment until her other acute medical conditions are stabilized. Hospice/palliative care is reasonable if her condition continues to deteriorate. Olivia Mackie will update me if she improves. .   07/23/2021 - 07/31/2021, hospitalized due to A. fib with RVR. 09/09/2021 - 09/14/2021, the patient due to A. fib with RVR, uncontrolled diabetes with glucose of 500s, DKA. 09/09/2021, CT angio chest PE protocol showed interval decrease of the right upper lobe nodularity.  No PE.  10/09/2021 , re-establish care, she expressed interests of resuming RCC treatments. She agrees with getting CT restaging. She reports being complaint with her medication.   10/28/21 CT showed progression of right renal mass, retroperitoneal lymphadenopathy. Stable small lung nodules. Cardiomegaly with biatrial dilatation. Aortic atherosclerosis. calcifications of the aortic valve, Dilatation of the pulmonic trunk   01/20/2022, CT chest abdomen pelvis with contrast showed heterogeneous mass of the anterior midportion of the right kidney is slightly increased in size, as is an adjacent right retroperitoneal lymph node.  Consistent with disease progression.  Findings are consistent with worsened renal cell carcinoma and adjacent nodal metastatic disease. New pulmonary nodule of the superior segment right lower lobe measuring 0.7 cm, highly suspicious for a small metastasis. Multiple additional tiny pulmonary nodules are unchanged and likely benign and incidental. Attention on follow-up. Coronary artery disease.  03/08/2022-03/22/22    axitinib 5 mg twice daily. 03/22/2022-03/26/2022 patient was admitted due to altered mental status, delirium.  Urine culture was negative.  MRI brain with contrast was negative for metastasis or stroke.  CT abdomen pelvis and chest showed pulmonary nodules and existing renal mass. Due to concern of possible encephalopathy secondary to axitinib, axitinib was discontinued   INTERVAL HISTORY Barbara Frank is a 76 y.o. female who has above history reviewed by me today presents for follow up visit for management of metastatic renal clear-cell carcinoma Today she is accompanied by her friend [name] Patient reports feeling well today.   Patient currently resides in assisted living. She reports tolerating cabozantinib 40 mg daily.  Patient denies nausea vomiting diarrhea.  Chronic fatigue, unchanged.  Review of Systems  Constitutional:  Positive for fatigue. Negative for appetite change, chills and fever.  HENT:   Negative for hearing loss and voice change.   Eyes:  Negative for eye problems.  Respiratory:  Negative for chest tightness and cough.   Cardiovascular:  Negative for chest pain.  Gastrointestinal:  Negative for abdominal distention, abdominal pain, blood in stool, constipation and diarrhea.  Endocrine: Negative for hot flashes.  Genitourinary:  Negative for difficulty urinating and frequency.   Musculoskeletal:  Positive for back pain. Negative for arthralgias.  Skin:  Negative for itching and rash.  Neurological:  Negative for extremity weakness.  Hematological:  Negative for adenopathy.  Psychiatric/Behavioral:  Positive  for depression. Negative for confusion.     MEDICAL HISTORY:  Past Medical History:  Diagnosis Date   A-fib ([place])    Depression    Diabetes mellitus without complication ([place])    History of kidney stones    Kidney mass     SURGICAL HISTORY: Past Surgical History:  Procedure Laterality Date   GASTRIC BYPASS     HERNIA REPAIR     TONSILLECTOMY      URETEROSCOPY WITH HOLMIUM LASER LITHOTRIPSY Left     SOCIAL HISTORY: Social History   Socioeconomic History   Marital status: Widowed    Spouse name: Not on file   Number of children: Not on file   Years of education: Not on file   Highest education level: Not on file  Occupational History   Not on file  Tobacco Use   Smoking status: Never   Smokeless tobacco: Never  Vaping Use   Vaping Use: Never used  Substance and Sexual Activity   Alcohol use: Not Currently   Drug use: Not Currently   Sexual activity: Not Currently  Other Topics Concern   Not on file  Social History Narrative   Not on file   Social Determinants of Health   Financial Resource Strain: Not on file  Food Insecurity: Not on file  Transportation Needs: Not on file  Physical Activity: Not on file  Stress: Not on file  Social Connections: Not on file  Intimate Partner Violence: Not on file    FAMILY HISTORY: Family History  Problem Relation Age of Onset   Breast cancer Mother    Bladder Cancer Neg Hx    Kidney cancer Neg Hx    Prostate cancer Neg Hx     ALLERGIES:  is allergic to kiwi extract.  MEDICATIONS:  Current Outpatient Medications  Medication Sig Dispense Refill   apixaban (ELIQUIS) 5 MG TABS tablet Take 1 tablet (5 mg total) by mouth 2 (two) times daily. 60 tablet 2   cabozantinib (CABOMETYX) 40 MG tablet Take 1 tablet (40 mg total) by mouth daily. Take on an empty stomach, 1 hour before or 2 hours after meals. 30 tablet 0   cetirizine (ZYRTEC) 10 MG tablet Take 0.5 tablets (5 mg total) by mouth daily. 15 tablet 0   diltiazem (CARDIZEM CD) 240 MG 24 hr capsule Take 1 capsule (240 mg total) by mouth every evening. 30 capsule 2   escitalopram (LEXAPRO) 10 MG tablet Take 10 mg by mouth daily.     insulin aspart (NOVOLOG) 100 UNIT/ML injection Inject 0-15 Units into the skin 3 (three) times daily with meals. CBG 70 - 120: 0 units,  121 - 150: 2 units, 151 - 200: 3 units,  201 - 250: 5 units,   251 - 300: 8 units, 301 - 350: 11 units 351 - 400: 15 units, CBG > 400: call MD and obtain STAT lab verification 10 mL 11   insulin glargine (LANTUS) 100 UNIT/ML injection Inject 0.25 mLs (25 Units total) into the skin 2 (two) times daily. 10 mL 11   metFORMIN (GLUCOPHAGE-XR) 500 MG 24 hr tablet Take 1 tablet (500 mg total) by mouth 2 (two) times daily. (Patient taking differently: Take 1,000 mg by mouth daily.) 60 tablet 2   metoprolol succinate (TOPROL-XL) 25 MG 24 hr tablet Take 25 mg by mouth daily.     nystatin (MYCOSTATIN/NYSTOP) powder Apply 1 Application topically 2 (two) times daily. Apply to abdominal fold and groin in the mornings  Applyunder  both breasts at night     polyethylene glycol (MIRALAX / GLYCOLAX) 17 g packet Take 17 g by mouth daily.     cyanocobalamin (VITAMIN B12) 1000 MCG/ML injection Inject into the muscle. (Patient not taking: Reported on 06/09/2022)     cyanocobalamin 1000 MCG tablet Take 1,000 mcg by mouth daily. (Patient not taking: Reported on 04/28/2022)     No current facility-administered medications for this visit.     PHYSICAL EXAMINATION: ECOG PERFORMANCE STATUS: 2 - Symptomatic, <50% confined to bed Vitals:   06/09/22 1357  BP: (!) 148/79  Pulse: 76  Resp: 18  Temp: 97.9 F (36.6 C)   Filed Weights   06/09/22 1357  Weight: 154 lb 6.4 oz (70 kg)    Physical Exam Constitutional:      General: She is not in acute distress.    Comments: Frail elderly female, she ambulates with a walker.   HENT:     Head: Normocephalic and atraumatic.  Eyes:     General: No scleral icterus. Cardiovascular:     Rate and Rhythm: Normal rate. Rhythm irregular.     Heart sounds: Normal heart sounds.  Pulmonary:     Effort: Pulmonary effort is normal. No respiratory distress.     Breath sounds: No wheezing.  Abdominal:     General: Bowel sounds are normal. There is no distension.     Palpations: Abdomen is soft.  Musculoskeletal:        General: No  deformity. Normal range of motion.     Cervical back: Normal range of motion and neck supple.  Skin:    General: Skin is warm and dry.     Findings: No erythema or rash.  Neurological:     Mental Status: She is alert and oriented to person, place, and time.     Cranial Nerves: No cranial nerve deficit.     Coordination: Coordination normal.     LABORATORY DATA:  I have reviewed the data as listed    Latest Ref Rng & Units 06/09/2022    1:39 PM 05/19/2022    1:51 PM 04/28/2022    1:07 PM  CBC  WBC 4.0 - 10.5 K/uL 4.9  5.3  6.0   Hemoglobin 12.0 - 15.0 g/dL 13.7  13.4  12.2   Hematocrit 36.0 - 46.0 % 42.1  42.0  39.7   Platelets 150 - 400 K/uL 171  198  254       Latest Ref Rng & Units 06/09/2022    1:39 PM 05/19/2022    1:51 PM 04/28/2022    1:07 PM  CMP  Glucose 70 - 99 mg/dL 351  277  111   BUN 8 - 23 mg/dL '13  10  19   ' Creatinine 0.44 - 1.00 mg/dL 0.63  0.60  0.61   Sodium 135 - 145 mmol/L 137  135  140   Potassium 3.5 - 5.1 mmol/L 4.5  3.8  3.8   Chloride 98 - 111 mmol/L 103  103  106   CO2 22 - 32 mmol/L '28  27  27   ' Calcium 8.9 - 10.3 mg/dL 9.8  9.8  10.2   Total Protein 6.5 - 8.1 g/dL 6.3  6.4  6.5   Total Bilirubin 0.3 - 1.2 mg/dL 0.3  0.4  0.4   Alkaline Phos 38 - 126 U/L 132  172  103   AST 15 - 41 U/L '29  24  14   ' ALT 0 - 44  U/L '26  20  11        ' RADIOGRAPHIC STUDIES: I have personally reviewed the radiological images as listed and agreed with the findings in the report. CT Cervical Spine Wo Contrast  Result Date: 06/04/2022 CLINICAL DATA:  Trauma, fall EXAM: CT CERVICAL SPINE WITHOUT CONTRAST TECHNIQUE: Multidetector CT imaging of the cervical spine was performed without intravenous contrast. Multiplanar CT image reconstructions were also generated. RADIATION DOSE REDUCTION: This exam was performed according to the departmental dose-optimization program which includes automated exposure control, adjustment of the mA and/or kV according to patient size and/or  use of iterative reconstruction technique. COMPARISON:  03/22/2022 FINDINGS: Alignment: Alignment of posterior margins of vertebral bodies is unremarkable. There is mild levoscoliosis. Skull base and vertebrae: No recent fracture is seen. Degenerative changes are noted with bony spurs from C2-C7 levels. Soft tissues and spinal canal: There is no central spinal stenosis. Disc levels: There is encroachment of neural foramina from C2-C7 levels. Upper chest: Unremarkable. Other: There is inhomogeneous attenuation in thyroid. There is 1.2 cm low-density nodule in the inferior left lobe. This finding has not changed. No follow-up imaging is recommended. IMPRESSION: No recent fracture is seen in cervical spine. Cervical spondylosis with encroachment of neural foramina at multiple levels. No significant interval changes are noted. Electronically Signed   By: Elmer Picker M.D.   On: 06/04/2022 14:33   CT HEAD WO CONTRAST (5MM)  Result Date: 06/04/2022 CLINICAL DATA:  Trauma, fall EXAM: CT HEAD WITHOUT CONTRAST TECHNIQUE: Contiguous axial images were obtained from the base of the skull through the vertex without intravenous contrast. RADIATION DOSE REDUCTION: This exam was performed according to the departmental dose-optimization program which includes automated exposure control, adjustment of the mA and/or kV according to patient size and/or use of iterative reconstruction technique. COMPARISON:  03/22/2022 FINDINGS: Brain: No acute intracranial findings are seen. There are no signs of bleeding within the cranium. Ventricles are not dilated. Cortical sulci are prominent. There is decreased density in periventricular white matter. Vascular: Scattered arterial calcifications are seen. Skull: No fracture is seen in calvarium. Sinuses/Orbits: There are no air-fluid levels in paranasal sinuses. Mucosal thickening is seen in ethmoid and maxillary sinuses. Other: None. IMPRESSION: No acute intracranial findings are seen  in noncontrast CT brain. Atrophy. Small-vessel disease. Electronically Signed   By: Elmer Picker M.D.   On: 06/04/2022 14:27   DG Tibia/Fibula Right  Result Date: 06/04/2022 CLINICAL DATA:  A 76 year old female presents for evaluation of leg abrasion., pain along the medial aspect of the RIGHT lower leg. EXAM: RIGHT TIBIA AND FIBULA - 2 VIEW COMPARISON:  March 16, 2021. FINDINGS: Osteopenia without acute fracture or dislocation. Some stranding in the subcutaneous fat along the medial proximal third of the RIGHT lower leg. Degenerative changes are incidentally noted about the RIGHT knee. IMPRESSION: 1. No acute fracture or dislocation. 2. Soft tissue swelling along the medial proximal third of the RIGHT lower leg. This may represent contusion or small area of subcutaneous hematoma/bruising. Correlate clinically. Electronically Signed   By: Zetta Bills M.D.   On: 06/04/2022 13:50

## 2022-06-14 ENCOUNTER — Other Ambulatory Visit (HOSPITAL_COMMUNITY): Payer: Self-pay

## 2022-06-16 ENCOUNTER — Other Ambulatory Visit (HOSPITAL_COMMUNITY): Payer: Self-pay

## 2022-06-18 ENCOUNTER — Other Ambulatory Visit (HOSPITAL_COMMUNITY): Payer: Self-pay

## 2022-07-06 ENCOUNTER — Other Ambulatory Visit: Payer: Self-pay | Admitting: Oncology

## 2022-07-06 ENCOUNTER — Telehealth: Payer: Self-pay

## 2022-07-06 ENCOUNTER — Other Ambulatory Visit (HOSPITAL_COMMUNITY): Payer: Self-pay

## 2022-07-06 DIAGNOSIS — C641 Malignant neoplasm of right kidney, except renal pelvis: Secondary | ICD-10-CM

## 2022-07-06 MED ORDER — CABOMETYX 40 MG PO TABS
40.0000 mg | ORAL_TABLET | Freq: Every day | ORAL | 0 refills | Status: DC
  Filled 2022-07-06: qty 30, 30d supply, fill #0

## 2022-07-06 NOTE — Telephone Encounter (Addendum)
4th attempt to call patient to schedule refill of Cabometyx. Phone number on file not in service and sending a message to nurse as well.   EDIT: Was able to get in touch with [name]. Updated patient phone number to reflect Barbara Frank since Barbara Frank no longer has a phone.   [name], [facility] Patient [name]  [phone] (phone) [phone] (fax) 07/06/2022 2:13 PM

## 2022-07-09 ENCOUNTER — Other Ambulatory Visit (HOSPITAL_COMMUNITY): Payer: Self-pay

## 2022-07-14 ENCOUNTER — Inpatient Hospital Stay (HOSPITAL_BASED_OUTPATIENT_CLINIC_OR_DEPARTMENT_OTHER): Payer: Medicare PPO | Admitting: Oncology

## 2022-07-14 ENCOUNTER — Inpatient Hospital Stay: Payer: Medicare PPO

## 2022-07-14 ENCOUNTER — Inpatient Hospital Stay: Payer: Medicare PPO | Attending: Oncology

## 2022-07-14 ENCOUNTER — Encounter: Payer: Self-pay | Admitting: Oncology

## 2022-07-14 VITALS — BP 113/81 | HR 72 | Temp 97.5°F | Resp 18 | Wt 148.0 lb

## 2022-07-14 DIAGNOSIS — E860 Dehydration: Secondary | ICD-10-CM

## 2022-07-14 DIAGNOSIS — R112 Nausea with vomiting, unspecified: Secondary | ICD-10-CM

## 2022-07-14 DIAGNOSIS — C641 Malignant neoplasm of right kidney, except renal pelvis: Secondary | ICD-10-CM | POA: Diagnosis present

## 2022-07-14 DIAGNOSIS — E119 Type 2 diabetes mellitus without complications: Secondary | ICD-10-CM | POA: Diagnosis not present

## 2022-07-14 DIAGNOSIS — Z794 Long term (current) use of insulin: Secondary | ICD-10-CM | POA: Insufficient documentation

## 2022-07-14 DIAGNOSIS — R410 Disorientation, unspecified: Secondary | ICD-10-CM | POA: Diagnosis not present

## 2022-07-14 DIAGNOSIS — G9340 Encephalopathy, unspecified: Secondary | ICD-10-CM | POA: Insufficient documentation

## 2022-07-14 DIAGNOSIS — Z79899 Other long term (current) drug therapy: Secondary | ICD-10-CM | POA: Insufficient documentation

## 2022-07-14 LAB — COMPREHENSIVE METABOLIC PANEL
ALT: 31 U/L (ref 0–44)
AST: 42 U/L — ABNORMAL HIGH (ref 15–41)
Albumin: 3.2 g/dL — ABNORMAL LOW (ref 3.5–5.0)
Alkaline Phosphatase: 95 U/L (ref 38–126)
Anion gap: 8 (ref 5–15)
BUN: 11 mg/dL (ref 8–23)
CO2: 27 mmol/L (ref 22–32)
Calcium: 10.4 mg/dL — ABNORMAL HIGH (ref 8.9–10.3)
Chloride: 102 mmol/L (ref 98–111)
Creatinine, Ser: 0.68 mg/dL (ref 0.44–1.00)
GFR, Estimated: >60 mL/min (ref >60)
Glucose, Bld: 245 mg/dL — ABNORMAL HIGH (ref 70–99)
Potassium: 3.9 mmol/L (ref 3.5–5.1)
Sodium: 137 mmol/L (ref 135–145)
Total Bilirubin: 0.6 mg/dL (ref 0.3–1.2)
Total Protein: 6.5 g/dL (ref 6.5–8.1)

## 2022-07-14 LAB — CBC WITH DIFFERENTIAL/PLATELET
Abs Immature Granulocytes: 0.01 10*3/uL (ref 0.00–0.07)
Basophils Absolute: 0 10*3/uL (ref 0.0–0.1)
Basophils Relative: 0 %
Eosinophils Absolute: 0.1 10*3/uL (ref 0.0–0.5)
Eosinophils Relative: 1 %
HCT: 48.8 % — ABNORMAL HIGH (ref 36.0–46.0)
Hemoglobin: 15.5 g/dL — ABNORMAL HIGH (ref 12.0–15.0)
Immature Granulocytes: 0 %
Lymphocytes Relative: 50 %
Lymphs Abs: 2.9 10*3/uL (ref 0.7–4.0)
MCH: 27.8 pg (ref 26.0–34.0)
MCHC: 31.8 g/dL (ref 30.0–36.0)
MCV: 87.6 fL (ref 80.0–100.0)
Monocytes Absolute: 0.3 10*3/uL (ref 0.1–1.0)
Monocytes Relative: 5 %
Neutro Abs: 2.6 10*3/uL (ref 1.7–7.7)
Neutrophils Relative %: 44 %
Platelets: 188 10*3/uL (ref 150–400)
RBC: 5.57 MIL/uL — ABNORMAL HIGH (ref 3.87–5.11)
RDW: 20.1 % — ABNORMAL HIGH (ref 11.5–15.5)
WBC: 5.9 10*3/uL (ref 4.0–10.5)
nRBC: 0 % (ref 0.0–0.2)

## 2022-07-14 LAB — TSH: TSH: 7.502 u[IU]/mL — ABNORMAL HIGH (ref 0.350–4.500)

## 2022-07-14 MED ORDER — FAMOTIDINE IN NACL 20-0.9 MG/50ML-% IV SOLN
20.0000 mg | Freq: Once | INTRAVENOUS | Status: AC
  Administered 2022-07-14: 20 mg via INTRAVENOUS
  Filled 2022-07-14: qty 50

## 2022-07-14 MED ORDER — ONDANSETRON HCL 8 MG PO TABS: 8.0000 mg | ORAL_TABLET | Freq: Three times a day (TID) | ORAL | 0 refills | Status: DC | PRN

## 2022-07-14 MED ORDER — SODIUM CHLORIDE 0.9 % IV SOLN: 8.0000 mg | Freq: Once | INTRAVENOUS | Status: DC

## 2022-07-14 MED ORDER — ONDANSETRON HCL 4 MG/2ML IJ SOLN
8.0000 mg | Freq: Once | INTRAMUSCULAR | Status: AC
  Administered 2022-07-14: 8 mg via INTRAVENOUS
  Filled 2022-07-14: qty 4

## 2022-07-14 MED ORDER — SODIUM CHLORIDE 0.9 % IV SOLN
Freq: Once | INTRAVENOUS | Status: AC
  Filled 2022-07-14: qty 250

## 2022-07-14 NOTE — Progress Notes (Addendum)
Hematology/Oncology Progress note [phone] [phone]      Patient Care Team: Virgie Dad, FNP as PCP - General (Family Medicine) [name], MD as Consulting Physician (Hematology and Oncology)    ASSESSMENT & PLAN:   Cancer Staging  Renal cell carcinoma [facility]) Staging form: Kidney, AJCC 8th Edition - Clinical stage from 12/24/2020: Stage IV (cT1b, cN1, cM1) - Signed by [name], MD on 12/24/2020   Renal cell carcinoma ([place]) Right RCC  with retroperitoneal lymphadenopathy, lung mass and peritoneal nodularity Progressed on 1st line Keytruda, did not tolerate 2nd line Axitinib 42m BID- encephalopathy Labs reviewed and discussed with patient.   She did not tolerate cabozantinib, 40 mg daily.  I advised patient to stop cabozantinib. Repeat CT chest abdomen pelvis for evaluation of treatment response.  Dehydration Labs are reviewed and discussed with patient. Patient has increased hemoglobin, increased calcium level, recent history of nausea vomiting.  Consistent with hemoconcentration secondary to dehydration. IV fluid 1 L normal saline x1. IV Zofran 8 mg x 1 IV Pepcid 20 mg x 1.  Nausea with vomiting Recommend patient to try Zofran 8 mg 3 times daily as needed for nausea.  Prescription provided to patient and DErnie Hew Encephalopathy Check UA, urine culture. Repeat brain MRI.    Orders Placed This Encounter  Procedures   Urine Culture    Standing Status:   Future    Standing Expiration Date:   07/15/2023   MR Brain W Wo Contrast    Standing Status:   Future    Standing Expiration Date:   07/14/2023    Order Specific Question:   If indicated for the ordered procedure, I authorize the administration of contrast media per Radiology protocol    Answer:   Yes    Order Specific Question:   What is the patient's sedation requirement?    Answer:   No Sedation    Order Specific Question:   Does the patient have a pacemaker or implanted devices?     Answer:   No    Order Specific Question:   Use SRS Protocol?    Answer:   No    Order Specific Question:   [facility]?    [facility]   CT CHEST ABDOMEN PELVIS W CONTRAST    To be done  prior to 11/22.    Standing Status:   Future    Standing Expiration Date:   07/15/2023    Order Specific Question:   Preferred imaging location?    Answer:   [facility]    Order Specific Question:   Is Oral Contrast requested for this exam?    Answer:   Yes, Per Radiology protocol   CBC with Differential/Platelet    Standing Status:   Future    Standing Expiration Date:   07/15/2023   Comprehensive metabolic panel    Standing Status:   Future    Standing Expiration Date:   07/14/2023   CBC with Differential/Platelet    Standing Status:   Future    Standing Expiration Date:   07/15/2023   Comprehensive metabolic panel    Standing Status:   Future    Standing Expiration Date:   07/14/2023   Urinalysis, Complete w Microscopic    Standing Status:   Future    Standing Expiration Date:   07/15/2023    All questions were answered. The patient knows to call the clinic with any problems, questions or concerns.  [name], MD, PhD [facility] Hematology Oncology 07/14/2022   CHIEF COMPLAINTS/REASON FOR VISIT:  Metastatic RCC  HISTORY OF PRESENTING ILLNESS:   Patient is a poor historian. Her mood appears to be depressed.  She currently lives at assisted living facility. Moved from [place]. Widowed Her power of attorney is [name] who is her husband's niece. [name] was called during this encounter.   Extensive medical record review was performed. She has had multiple images done during the past few months, 05/27/20 CT head wo contrast- no acute intracranial abnormality.  06/01/20 CT abdomen and pelvis with contrast 4 mm proximal left ureteral calculus causing mild proximal left- sided hydroureteronephrosis with periureteric and perinephric  stranding on the left.5.2cm enhancing right mid anterior kidney mass. Mildly enlarged right retroperitoneal retrocaval lymph nodes suspicious for nodal metastases Mild cardiomegaly, Small hiatal hernia. Status post gastric bypass surgery and cholecystectomy. Postoperative changes anterior abdominal wall. 06/05/20 CT head wo contrast - No acute intracranial abnormality. Probable tiny right frontal scalp Contusion. 2. Mild global volume loss and minimal chronic ischemic small vessel disease. 06/06/20 CT abdomen pelvis wo contrast 1. Indeterminate lesion in the right kidney which is stable may be a solid mass., 2. Partially calcified uterine fibroids. 3. Left UVJ calculus measuring 5 mm 06/07/20 CT head wo contrast.  1. No acute intracranial abnormality. No significant change. 2. Intracranial atherosclerosis, atrophy and chronic small vessel ischemic changes. 3. Chronic/subacute right maxillary sinus disease.  06/07/20 Cystoscopy by [name] for Ureteroscopic stone extraction Admitted on 10/3/21due to failure to thrive,  anorexia, dehydration, electrolyte imbalances.   06/09/20 CT abdomen and chest with contrast 1. Right renal mass is stable in size. Most suggestive of a renal neoplasm. 2. Right retroperitoneal lymph nodes are stable in size; may well be metastatic. 3. Left hydronephrosis and hydroureter is stable. Pelvis is not imaged therefore if there is a distal calculus still present, it is not visible. 4. Sclerotic lesions in the anterior left seventh, eighth and ninth ribs which given distribution are most likely healed rib fractures.  She is on Eliquis 103m Bid for atrial fibrillation. She reports not taking anti depressants that were given by her PCP.  #11/04/2020, PET scan was obtained. PET scan showed interval increasing size of right upper lobe pulmonary lesion, now 3 cm with an SUV of 3.8.  Associated with a new right lower lobe nodule 8 mm.  5.1 cm right renal mass hypermetabolic  activity.  1.9 retrocaval lymph node with SUV of 4.8.  Second index retrocaval node with SUV of 2.  Nodular disease seen in the duodenum on previous study is not well demonstrated on noncontrast CT image today.  Some nodular FDG activity, nonspecific. Multiple anterior abdominal soft tissue nodules are evident.  13 mm anterior left nodule shows no substantial hypermetabolic with an SUV of 1.7.  Calcified left lateral nodule with an SUV of 2.9.  2.2 cm calcified nodule lobe right paracolic gutter with an SUV of 2.5.  12/17/2020, right kidney mass biopsy pathology was reviewed and discussed with patient.  Pathology is consistent with RCC-demonstrates the morphology spectrum, papillary, solid and nested areas.  CD10 positive, racemase positive, CK7 negative.  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  TFE 3 break apart FISH testing is pending.  #01/05/2021, addendum pathology TFE3 break-apart FISH testing was performed and a translocation  involving Xp11 / TFE3 transcription gene was not detected. HMB-45 IHC is negative  CAIX IHC demonstrates membranous staining.  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  MRI brain was obtained during the admission and was negative for acute process.  Patient mental status improved.  UTI was treated with IV antibiotics and switched to p.o. antibiotic upon discharge.  Patient also was found to have rapid ventricular response with her atrial fibrillation.  Rate was treated with Cardizem drip during hospitalization because and switched to Toprol and Cardizem for rate control.  Eliquis for anticoagulation. Patient's axitinib was held during  hospitalization.  There was plan for patient to restart axitinib upon discharge.  However since patient was sent to rehab, he is not able to be started on axitinib due to lack of access to her medication supply.  [facility]  Patient presents for post hospitalization follow-up.  She does not have much recollection of her recent admission.  Reports feeling well today.  He denies any fever, chills, nausea vomiting diarrhea.  03/07/2021, CT abdomen pelvis  Right renal mass slightly enlarged since 10/03/2020.  5.4 x 4.9 x 6.4 comparing to previously 5.0 x 4.4 x 6.3.  Stable adjacent right retroperitoneal lymphadenopathy.  There is a new 1.5 cm nodule in the right lower lobe.  Mild stranding around the right renal mass.  04/01/2022 resumed Axitinib 80mg50mD 05/06/2022 she presented for follow up, she stopped " all her medications". Tachycardia, hyperglycemia, she declined ER evaluation.  06/03/2022, presented for follow up.  Multiple no show to her CT scan. Palliative care service has not been able to reach her  06/23/2021, CT chest abdomen pelvis showed stable large right anterior renal mass.  Stable to slightly larger right-sided retroperitoneal adenopathy.  Waxing and waning pulmonary nodules.  2 lower lobe nodules resolved.  New right middle lobe and right upper lobe nodules.  06/29/2022  Hold chemotherapy due to hyperglycemia.  Blood sugar was 431, due to noncompliance.  Recommend patient to go to emergency room for evaluation and she declines. talked to patient's PCP TracOlivia Mackier the phone and discussed patient's case with her. Patient is not complaint with her medication. Her diabetes was poorly controlled and glucose was extremely high, she refused to go to ER for management. She is also extremely depressed. Most recent CT scan shows stable disease. We both agree that patient is not able to take care of herself.  TracOlivia Mackiel try to get her to  see psychiatrist for evaluation of her depression,she may benefit from living in a facility as well. From Oncology aspect, her prognosis is poor, and I will hold off additional treatment until her other acute medical conditions are stabilized. Hospice/palliative care is reasonable if her condition continues to deteriorate. TracOlivia Mackiel update me if she improves. .   07/23/2021 - 07/31/2021, hospitalized due to A. fib with RVR. 09/09/2021 - 09/14/2021, the patient due to A. fib with RVR, uncontrolled diabetes with glucose of 500s, DKA. 09/09/2021, CT angio chest PE protocol showed interval decrease of the right upper lobe nodularity.  No PE.  10/09/2021 , re-establish care, she expressed interests of resuming RCC treatments. She agrees with getting CT restaging. She reports being complaint with her medication.   10/28/21 CT showed progression of right renal mass, retroperitoneal lymphadenopathy. Stable small  lung nodules. Cardiomegaly with biatrial dilatation. Aortic atherosclerosis. calcifications of the aortic valve, Dilatation of the pulmonic trunk   01/20/2022, CT chest abdomen pelvis with contrast showed heterogeneous mass of the anterior midportion of the right kidney is slightly increased in size, as is an adjacent right retroperitoneal lymph node.  Consistent with disease progression.  Findings are consistent with worsened renal cell carcinoma and adjacent nodal metastatic disease. New pulmonary nodule of the superior segment right lower lobe measuring 0.7 cm, highly suspicious for a small metastasis. Multiple additional tiny pulmonary nodules are unchanged and likely benign and incidental. Attention on follow-up. Coronary artery disease.  03/08/2022-03/22/22   axitinib 5 mg twice daily. 03/22/2022-03/26/2022 patient was admitted due to altered mental status, delirium.  Urine culture was negative.  MRI brain with contrast was negative for metastasis or stroke.  CT abdomen pelvis and chest showed pulmonary nodules  and existing renal mass. Due to concern of possible encephalopathy secondary to axitinib, axitinib was discontinued  04/28/2022 cabozantinib 20 mg daily.  Patient tolerated well. 05/19/2022, cabozantinib increased to 40 mg daily. INTERVAL HISTORY Auriah Hollings is a 76 y.o. female who has above history reviewed by me today presents for follow up visit for management of metastatic renal clear-cell carcinoma Today she is accompanied by her friend [name] Patient reports nausea and vomiting yesterday. [name] reports mental status change, patient talks about seeing family members who have deceased.  Chronic fatigue.  Weight loss of 6 pounds since last visit.  Review of Systems  Constitutional:  Positive for appetite change, fatigue and unexpected weight change. Negative for chills and fever.  HENT:   Negative for hearing loss and voice change.   Eyes:  Negative for eye problems.  Respiratory:  Negative for chest tightness and cough.   Cardiovascular:  Negative for chest pain.  Gastrointestinal:  Negative for abdominal distention, abdominal pain, blood in stool, constipation and diarrhea.  Endocrine: Negative for hot flashes.  Genitourinary:  Negative for difficulty urinating and frequency.   Musculoskeletal:  Positive for back pain. Negative for arthralgias.  Skin:  Negative for itching and rash.  Neurological:  Negative for extremity weakness.  Hematological:  Negative for adenopathy.  Psychiatric/Behavioral:  Positive for confusion and depression.     MEDICAL HISTORY:  Past Medical History:  Diagnosis Date   A-fib ([place])    Depression    Diabetes mellitus without complication ([place])    History of kidney stones    Kidney mass     SURGICAL HISTORY: Past Surgical History:  Procedure Laterality Date   GASTRIC BYPASS     HERNIA REPAIR     TONSILLECTOMY     URETEROSCOPY WITH HOLMIUM LASER LITHOTRIPSY Left     SOCIAL HISTORY: Social History   Socioeconomic History   Marital  status: Widowed    Spouse name: Not on file   Number of children: Not on file   Years of education: Not on file   Highest education level: Not on file  Occupational History   Not on file  Tobacco Use   Smoking status: Never   Smokeless tobacco: Never  Vaping Use   Vaping Use: Never used  Substance and Sexual Activity   Alcohol use: Not Currently   Drug use: Not Currently   Sexual activity: Not Currently  Other Topics Concern   Not on file  Social History Narrative   Not on file   Social Determinants of Health   Financial Resource Strain: Not on file  Food Insecurity: Not on  file  Transportation Needs: Not on file  Physical Activity: Not on file  Stress: Not on file  Social Connections: Not on file  Intimate Partner Violence: Not on file    FAMILY HISTORY: Family History  Problem Relation Age of Onset   Breast cancer Mother    Bladder Cancer Neg Hx    Kidney cancer Neg Hx    Prostate cancer Neg Hx     ALLERGIES:  is allergic to kiwi extract.  MEDICATIONS:  Current Outpatient Medications  Medication Sig Dispense Refill   apixaban (ELIQUIS) 5 MG TABS tablet Take 1 tablet (5 mg total) by mouth 2 (two) times daily. 60 tablet 2   cabozantinib (CABOMETYX) 40 MG tablet Take 1 tablet (40 mg total) by mouth daily. Take on an empty stomach, 1 hour before or 2 hours after meals. 30 tablet 0   cetirizine (ZYRTEC) 10 MG tablet Take 0.5 tablets (5 mg total) by mouth daily. 15 tablet 0   cyanocobalamin (VITAMIN B12) 1000 MCG/ML injection Inject into the muscle.     cyanocobalamin 1000 MCG tablet Take 1,000 mcg by mouth daily.     diltiazem (CARDIZEM CD) 240 MG 24 hr capsule Take 1 capsule (240 mg total) by mouth every evening. 30 capsule 2   escitalopram (LEXAPRO) 10 MG tablet Take 10 mg by mouth daily.     insulin glargine (LANTUS) 100 UNIT/ML injection Inject 0.25 mLs (25 Units total) into the skin 2 (two) times daily. (Patient taking differently: Inject 22 Units into the skin  daily.) 10 mL 11   metFORMIN (GLUCOPHAGE-XR) 500 MG 24 hr tablet Take 1 tablet (500 mg total) by mouth 2 (two) times daily. (Patient taking differently: Take 1,000 mg by mouth daily.) 60 tablet 2   metoprolol succinate (TOPROL-XL) 25 MG 24 hr tablet Take 25 mg by mouth daily.     nystatin (MYCOSTATIN/NYSTOP) powder Apply 1 Application topically 2 (two) times daily. Apply to abdominal fold and groin in the mornings  Applyunder both breasts at night     ondansetron (ZOFRAN) 8 MG tablet Take 1 tablet (8 mg total) by mouth every 8 (eight) hours as needed for nausea or vomiting. 60 tablet 0   polyethylene glycol (MIRALAX / GLYCOLAX) 17 g packet Take 17 g by mouth daily as needed. (Patient not taking: Reported on 07/14/2022)     No current facility-administered medications for this visit.     PHYSICAL EXAMINATION: ECOG PERFORMANCE STATUS: 2 - Symptomatic, <50% confined to bed Vitals:   07/14/22 1329  BP: 113/81  Pulse: 72  Resp: 18  Temp: (!) 97.5 F (36.4 C)   Filed Weights   07/14/22 1329  Weight: 148 lb (67.1 kg)    Physical Exam Constitutional:      General: She is not in acute distress.    Comments: Frail elderly female, she ambulates with a walker.   HENT:     Head: Normocephalic and atraumatic.  Eyes:     General: No scleral icterus. Cardiovascular:     Rate and Rhythm: Normal rate. Rhythm irregular.     Heart sounds: Normal heart sounds.  Pulmonary:     Effort: Pulmonary effort is normal. No respiratory distress.     Breath sounds: No wheezing.  Abdominal:     General: Bowel sounds are normal. There is no distension.     Palpations: Abdomen is soft.  Musculoskeletal:        General: No deformity. Normal range of motion.     Cervical  back: Normal range of motion and neck supple.  Skin:    General: Skin is warm and dry.     Findings: No erythema or rash.  Neurological:     Mental Status: She is alert and oriented to person, place, and time. Mental status is at  baseline.     Cranial Nerves: No cranial nerve deficit.     Comments: Slow speech     LABORATORY DATA:  I have reviewed the data as listed    Latest Ref Rng & Units 07/14/2022    1:05 PM 06/09/2022    1:39 PM 05/19/2022    1:51 PM  CBC  WBC 4.0 - 10.5 K/uL 5.9  4.9  5.3   Hemoglobin 12.0 - 15.0 g/dL 15.5  13.7  13.4   Hematocrit 36.0 - 46.0 % 48.8  42.1  42.0   Platelets 150 - 400 K/uL 188  171  198       Latest Ref Rng & Units 07/14/2022    1:05 PM 06/09/2022    1:39 PM 05/19/2022    1:51 PM  CMP  Glucose 70 - 99 mg/dL 245  351  277   BUN 8 - 23 mg/dL _0 Creatinine 0.44 - 1.00 mg/dL 0.68  0.63  0.60   Sodium 135 - 145 mmol/L 137  137  135   Potassium 3.5 - 5.1 mmol/L 3.9  4.5  3.8   Chloride 98 - 111 mmol/L 102  103  103   CO2 22 - 32 mmol/L _1 Calcium 8.9 - 10.3 mg/dL 10.4  9.8  9.8   Total Protein 6.5 - 8.1 g/dL 6.5  6.3  6.4   Total Bilirubin 0.3 - 1.2 mg/dL 0.6  0.3  0.4   Alkaline Phos 38 - 126 U/L 95  132  172   AST 15 - 41 U/L 42  29  24   ALT 0 - 44 U/L _2 RADIOGRAPHIC STUDIES: I have personally reviewed the radiological images as listed and agreed with the findings in the report. No results found.

## 2022-07-14 NOTE — Assessment & Plan Note (Addendum)
Recommend patient to try Zofran 8 mg 3 times daily as needed for nausea.  Prescription provided to patient and [name]

## 2022-07-14 NOTE — Assessment & Plan Note (Signed)
Labs are reviewed and discussed with patient. Patient has increased hemoglobin, increased calcium level, recent history of nausea vomiting.  Consistent with hemoconcentration secondary to dehydration. IV fluid 1 L normal saline x1. IV Zofran 8 mg x 1 IV Pepcid 20 mg x 1.

## 2022-07-14 NOTE — Assessment & Plan Note (Addendum)
Right RCC  with retroperitoneal lymphadenopathy, lung mass and peritoneal nodularity Progressed on 1st line Keytruda, did not tolerate 2nd line Axitinib '5mg'$  BID- encephalopathy Labs reviewed and discussed with patient.   She did not tolerate cabozantinib, 40 mg daily.  I advised patient to stop cabozantinib. Repeat CT chest abdomen pelvis for evaluation of treatment response.

## 2022-07-14 NOTE — Progress Notes (Signed)
Pt here for follow up. Med rec done with Rich Reining (staff0 from [facility].

## 2022-07-14 NOTE — Assessment & Plan Note (Signed)
Check UA, urine culture. Repeat brain MRI.

## 2022-07-16 ENCOUNTER — Inpatient Hospital Stay: Payer: Medicare PPO

## 2022-07-16 VITALS — BP 126/82 | HR 69 | Temp 96.0°F | Resp 20

## 2022-07-16 DIAGNOSIS — R410 Disorientation, unspecified: Secondary | ICD-10-CM

## 2022-07-16 DIAGNOSIS — C641 Malignant neoplasm of right kidney, except renal pelvis: Secondary | ICD-10-CM

## 2022-07-16 DIAGNOSIS — E860 Dehydration: Secondary | ICD-10-CM

## 2022-07-16 LAB — CBC WITH DIFFERENTIAL/PLATELET
Abs Immature Granulocytes: 0 10*3/uL (ref 0.00–0.07)
Basophils Absolute: 0 10*3/uL (ref 0.0–0.1)
Basophils Relative: 0 %
Eosinophils Absolute: 0.1 10*3/uL (ref 0.0–0.5)
Eosinophils Relative: 1 %
HCT: 43.9 % (ref 36.0–46.0)
Hemoglobin: 13.9 g/dL (ref 12.0–15.0)
Immature Granulocytes: 0 %
Lymphocytes Relative: 61 %
Lymphs Abs: 2.9 10*3/uL (ref 0.7–4.0)
MCH: 28.2 pg (ref 26.0–34.0)
MCHC: 31.7 g/dL (ref 30.0–36.0)
MCV: 89 fL (ref 80.0–100.0)
Monocytes Absolute: 0.3 10*3/uL (ref 0.1–1.0)
Monocytes Relative: 6 %
Neutro Abs: 1.5 10*3/uL — ABNORMAL LOW (ref 1.7–7.7)
Neutrophils Relative %: 32 %
Platelets: 156 10*3/uL (ref 150–400)
RBC: 4.93 MIL/uL (ref 3.87–5.11)
RDW: 19.6 % — ABNORMAL HIGH (ref 11.5–15.5)
WBC: 4.8 10*3/uL (ref 4.0–10.5)
nRBC: 0 % (ref 0.0–0.2)

## 2022-07-16 LAB — COMPREHENSIVE METABOLIC PANEL
ALT: 25 U/L (ref 0–44)
AST: 29 U/L (ref 15–41)
Albumin: 2.9 g/dL — ABNORMAL LOW (ref 3.5–5.0)
Alkaline Phosphatase: 89 U/L (ref 38–126)
Anion gap: 7 (ref 5–15)
BUN: 15 mg/dL (ref 8–23)
CO2: 26 mmol/L (ref 22–32)
Calcium: 9.6 mg/dL (ref 8.9–10.3)
Chloride: 105 mmol/L (ref 98–111)
Creatinine, Ser: 0.46 mg/dL (ref 0.44–1.00)
GFR, Estimated: >60 mL/min (ref >60)
Glucose, Bld: 172 mg/dL — ABNORMAL HIGH (ref 70–99)
Potassium: 4 mmol/L (ref 3.5–5.1)
Sodium: 138 mmol/L (ref 135–145)
Total Bilirubin: 0.6 mg/dL (ref 0.3–1.2)
Total Protein: 6 g/dL — ABNORMAL LOW (ref 6.5–8.1)

## 2022-07-16 LAB — URINALYSIS, COMPLETE (UACMP) WITH MICROSCOPIC
Bilirubin Urine: NEGATIVE
Glucose, UA: 50 mg/dL — AB
Hgb urine dipstick: NEGATIVE
Ketones, ur: 5 mg/dL — AB
Nitrite: NEGATIVE
Protein, ur: 30 mg/dL — AB
Specific Gravity, Urine: 1.033 — ABNORMAL HIGH (ref 1.005–1.030)
pH: 5 (ref 5.0–8.0)

## 2022-07-16 MED ORDER — SODIUM CHLORIDE 0.9 % IV SOLN
Freq: Once | INTRAVENOUS | Status: AC
  Filled 2022-07-16: qty 250

## 2022-07-17 LAB — URINE CULTURE

## 2022-07-20 ENCOUNTER — Ambulatory Visit
Admission: RE | Admit: 2022-07-20 | Discharge: 2022-07-20 | Disposition: A | Discharge status: Home / Self Care | Payer: Medicare PPO | Source: Ambulatory Visit | Attending: Oncology | Admitting: Oncology

## 2022-07-20 DIAGNOSIS — C641 Malignant neoplasm of right kidney, except renal pelvis: Secondary | ICD-10-CM | POA: Insufficient documentation

## 2022-07-20 MED ORDER — IOHEXOL 300 MG/ML  SOLN
100.0000 mL | Freq: Once | INTRAMUSCULAR | Status: AC | PRN
  Administered 2022-07-20: 100 mL via INTRAVENOUS

## 2022-07-22 ENCOUNTER — Encounter: Payer: Self-pay | Admitting: Nurse Practitioner

## 2022-07-22 ENCOUNTER — Other Ambulatory Visit (HOSPITAL_COMMUNITY): Payer: Self-pay

## 2022-07-22 ENCOUNTER — Inpatient Hospital Stay: Payer: Medicare PPO | Admitting: Nurse Practitioner

## 2022-07-22 ENCOUNTER — Inpatient Hospital Stay: Payer: Medicare PPO

## 2022-07-22 VITALS — BP 110/61 | HR 63 | Temp 97.7°F | Resp 17 | Wt 154.8 lb

## 2022-07-22 DIAGNOSIS — R112 Nausea with vomiting, unspecified: Secondary | ICD-10-CM | POA: Diagnosis not present

## 2022-07-22 DIAGNOSIS — C641 Malignant neoplasm of right kidney, except renal pelvis: Secondary | ICD-10-CM

## 2022-07-22 LAB — COMPREHENSIVE METABOLIC PANEL
ALT: 21 U/L (ref 0–44)
AST: 26 U/L (ref 15–41)
Albumin: 3.1 g/dL — ABNORMAL LOW (ref 3.5–5.0)
Alkaline Phosphatase: 77 U/L (ref 38–126)
Anion gap: 6 (ref 5–15)
BUN: 13 mg/dL (ref 8–23)
CO2: 29 mmol/L (ref 22–32)
Calcium: 10 mg/dL (ref 8.9–10.3)
Chloride: 103 mmol/L (ref 98–111)
Creatinine, Ser: 0.6 mg/dL (ref 0.44–1.00)
GFR, Estimated: >60 mL/min (ref >60)
Glucose, Bld: 253 mg/dL — ABNORMAL HIGH (ref 70–99)
Potassium: 4.1 mmol/L (ref 3.5–5.1)
Sodium: 138 mmol/L (ref 135–145)
Total Bilirubin: 0.9 mg/dL (ref 0.3–1.2)
Total Protein: 6.3 g/dL — ABNORMAL LOW (ref 6.5–8.1)

## 2022-07-22 LAB — CBC WITH DIFFERENTIAL/PLATELET
Abs Immature Granulocytes: 0.01 10*3/uL (ref 0.00–0.07)
Basophils Absolute: 0 10*3/uL (ref 0.0–0.1)
Basophils Relative: 0 %
Eosinophils Absolute: 0.1 10*3/uL (ref 0.0–0.5)
Eosinophils Relative: 1 %
HCT: 44 % (ref 36.0–46.0)
Hemoglobin: 14 g/dL (ref 12.0–15.0)
Immature Granulocytes: 0 %
Lymphocytes Relative: 56 %
Lymphs Abs: 2.5 10*3/uL (ref 0.7–4.0)
MCH: 29 pg (ref 26.0–34.0)
MCHC: 31.8 g/dL (ref 30.0–36.0)
MCV: 91.1 fL (ref 80.0–100.0)
Monocytes Absolute: 0.3 10*3/uL (ref 0.1–1.0)
Monocytes Relative: 6 %
Neutro Abs: 1.7 10*3/uL (ref 1.7–7.7)
Neutrophils Relative %: 37 %
Platelets: 166 10*3/uL (ref 150–400)
RBC: 4.83 MIL/uL (ref 3.87–5.11)
RDW: 20.4 % — ABNORMAL HIGH (ref 11.5–15.5)
WBC: 4.6 10*3/uL (ref 4.0–10.5)
nRBC: 0 % (ref 0.0–0.2)

## 2022-07-22 NOTE — Progress Notes (Signed)
Patient here for oncology follow-up appointment, concerns of confusion & shakiness

## 2022-07-22 NOTE — Progress Notes (Signed)
Hematology/Oncology Progress Note [phone] [phone]   Patient Care Team: Virgie Dad, FNP as PCP - General (Family Medicine) [name], MD as Consulting Physician (Hematology and Oncology)  CHIEF COMPLAINTS/REASON FOR VISIT:  Metastatic RCC  HISTORY OF PRESENTING ILLNESS: Patient is a poor historian.  She currently lives at assisted living facility. Moved from [place]. Widowed Her power of attorney is [name] who is her husband's niece.   Extensive medical record review was performed. She has had multiple images done during the past few months, 05/27/20 CT head wo contrast- no acute intracranial abnormality.  06/01/20 CT abdomen and pelvis with contrast 4 mm proximal left ureteral calculus causing mild proximal left- sided hydroureteronephrosis with periureteric and perinephric stranding on the left.5.2cm enhancing right mid anterior kidney mass. Mildly enlarged right retroperitoneal retrocaval lymph nodes suspicious for nodal metastases Mild cardiomegaly, Small hiatal hernia. Status post gastric bypass surgery and cholecystectomy. Postoperative changes anterior abdominal wall. 06/05/20 CT head wo contrast - No acute intracranial abnormality. Probable tiny right frontal scalp Contusion. 2. Mild global volume loss and minimal chronic ischemic small vessel disease. 06/06/20 CT abdomen pelvis wo contrast 1. Indeterminate lesion in the right kidney which is stable may be a solid mass., 2. Partially calcified uterine fibroids. 3. Left UVJ calculus measuring 5 mm 06/07/20 CT head wo contrast.  1. No acute intracranial abnormality. No significant change. 2. Intracranial atherosclerosis, atrophy and chronic small vessel ischemic changes. 3. Chronic/subacute right maxillary sinus disease.  06/07/20 Cystoscopy by [name] for Ureteroscopic stone extraction Admitted on 10/3/21due to failure to thrive,  anorexia, dehydration, electrolyte imbalances.   06/09/20 CT  abdomen and chest with contrast 1. Right renal mass is stable in size. Most suggestive of a renal neoplasm. 2. Right retroperitoneal lymph nodes are stable in size; may well be metastatic. 3. Left hydronephrosis and hydroureter is stable. Pelvis is not imaged therefore if there is a distal calculus still present, it is not visible. 4. Sclerotic lesions in the anterior left seventh, eighth and ninth ribs which given distribution are most likely healed rib fractures.  She is on Eliquis 74m Bid for atrial fibrillation. She reports not taking anti depressants that were given by her PCP.  #11/04/2020, PET scan was obtained. PET scan showed interval increasing size of right upper lobe pulmonary lesion, now 3 cm with an SUV of 3.8.  Associated with a new right lower lobe nodule 8 mm.  5.1 cm right renal mass hypermetabolic activity.  1.9 retrocaval lymph node with SUV of 4.8.  Second index retrocaval node with SUV of 2.  Nodular disease seen in the duodenum on previous study is not well demonstrated on noncontrast CT image today.  Some nodular FDG activity, nonspecific. Multiple anterior abdominal soft tissue nodules are evident.  13 mm anterior left nodule shows no substantial hypermetabolic with an SUV of 1.7.  Calcified left lateral nodule with an SUV of 2.9.  2.2 cm calcified nodule lobe right paracolic gutter with an SUV of 2.5.  12/17/2020, right kidney mass biopsy pathology was reviewed and discussed with patient.  Pathology is consistent with RCC-demonstrates the morphology spectrum, papillary, solid and nested areas.  CD10 positive, racemase positive, CK7 negative.  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  TFE 3 break apart FISH testing is pending.  #01/05/2021, addendum pathology TFE3 break-apart FISH testing was performed and a translocation  involving Xp11 / TFE3 transcription gene was not detected. HMB-45 IHC is negative  CAIX IHC demonstrates  membranous staining.   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  MRI brain was obtained during the admission and was negative for acute process.  Patient mental status improved.  UTI was treated with IV antibiotics and switched to p.o. antibiotic upon discharge.  Patient also was found to have rapid ventricular response with her atrial fibrillation.  Rate was treated with Cardizem drip during hospitalization because and switched to Toprol and Cardizem for rate control.  Eliquis for anticoagulation. Patient's axitinib was held during hospitalization.  There was plan for patient to restart axitinib upon discharge.  However since patient was sent to rehab, he is not able to be started on axitinib due to lack of access to her medication supply.  [facility]  Patient presents for post hospitalization follow-up.  She does not have much recollection of her recent admission.  Reports feeling well today.  He denies any fever, chills, nausea vomiting diarrhea.  03/07/2021, CT abdomen pelvis  Right renal mass slightly enlarged since 10/03/2020.  5.4 x 4.9 x 6.4 comparing to previously 5.0 x 4.4 x 6.3.  Stable adjacent right retroperitoneal lymphadenopathy.  There is a new 1.5 cm nodule in the right lower lobe.  Mild stranding around the right renal mass.  04/01/2022 resumed Axitinib 66mg36mD 05/06/2022 she presented for follow up, she stopped " all her medications". Tachycardia, hyperglycemia, she declined ER evaluation.  06/03/2022, presented for follow up.  Multiple no show to her CT scan. Palliative care service has not been able to reach her  06/23/2021, CT chest abdomen  pelvis showed stable large right anterior renal mass.  Stable to slightly larger right-sided retroperitoneal adenopathy.  Waxing and waning pulmonary nodules.  2 lower lobe nodules resolved.  New right middle lobe and right upper lobe nodules.  06/29/2022  Hold chemotherapy due to hyperglycemia.  Blood sugar was 431, due to noncompliance.  Recommend patient to go to emergency room for evaluation and she declines. talked to patient's PCP TracOlivia Mackier the phone and discussed patient's case with her. Patient is not complaint with her medication. Her diabetes was poorly controlled and glucose was extremely high, she refused to go to ER for management. She is also extremely depressed. Most recent CT scan shows stable disease. We both agree that patient is not able to take care of herself.  TracOlivia Mackiel try to get her to see psychiatrist for evaluation of her depression,she may benefit from living in a facility as well. From Oncology aspect, her prognosis is poor, and I will hold off additional treatment until her other acute medical conditions are stabilized. Hospice/palliative care is reasonable if her condition continues to deteriorate. TracOlivia Mackiel update me if she improves. .   07/23/2021 - 07/31/2021, hospitalized due to A. fib with RVR. 09/09/2021 - 09/14/2021, the patient due to A. fib with RVR, uncontrolled diabetes with glucose of 500s, DKA. 09/09/2021, CT angio chest PE protocol showed interval decrease of the right upper lobe nodularity.  No PE.  10/09/2021 , re-establish care, she expressed interests of resuming RCC treatments. She agrees with getting CT restaging. She reports being complaint with her medication.   10/28/21 CT showed progression of right renal  mass, retroperitoneal lymphadenopathy. Stable small lung nodules. Cardiomegaly with biatrial dilatation. Aortic atherosclerosis. calcifications of the aortic valve, Dilatation of the pulmonic trunk   01/20/2022, CT chest abdomen pelvis with contrast showed  heterogeneous mass of the anterior midportion of the right kidney is slightly increased in size, as is an adjacent right retroperitoneal lymph node.  Consistent with disease progression.  Findings are consistent with worsened renal cell carcinoma and adjacent nodal metastatic disease. New pulmonary nodule of the superior segment right lower lobe measuring 0.7 cm, highly suspicious for a small metastasis. Multiple additional tiny pulmonary nodules are unchanged and likely benign and incidental. Attention on follow-up. Coronary artery disease.  03/08/2022-03/22/22   axitinib 5 mg twice daily. 03/22/2022-03/26/2022 patient was admitted due to altered mental status, delirium.  Urine culture was negative.  MRI brain with contrast was negative for metastasis or stroke.  CT abdomen pelvis and chest showed pulmonary nodules and existing renal mass. Due to concern of possible encephalopathy secondary to axitinib, axitinib was discontinued  04/28/2022 cabozantinib 20 mg daily.  Patient tolerated well. 05/19/2022, cabozantinib increased to 40 mg daily.   INTERVAL HISTORY Barbara Frank is a 75 y.o. female who has above history reviewed by me today presents for follow up visit for management of metastatic renal clear-cell carcinoma. She is accompanied by her friend [name]. She has ongoing mental status change, somewhat improved. Denies hallucinations today but continues to be disoriented. Nausea and vomiting has mostly resolved. Appetite is stable. Patient is worried about nursing taking her snacks.   Review of Systems  Reason unable to perform ROS: ROS is limited d/t confusion.  Constitutional:  Positive for fatigue. Negative for appetite change, chills and unexpected weight change.  Eyes:  Negative for eye problems.  Respiratory:  Negative for chest tightness, cough and shortness of breath.   Cardiovascular:  Negative for chest pain.  Gastrointestinal:  Negative for constipation, diarrhea, nausea and vomiting.   Genitourinary:  Negative for difficulty urinating, frequency and pelvic pain.   Musculoskeletal:  Positive for back pain. Negative for arthralgias.  Skin:  Negative for itching and rash.  Neurological:  Negative for extremity weakness.  Hematological:  Negative for adenopathy.  Psychiatric/Behavioral:  Positive for confusion and depression.     MEDICAL HISTORY:  Past Medical History:  Diagnosis Date   A-fib ([place])    Depression    Diabetes mellitus without complication ([place])    History of kidney stones    Kidney mass     SURGICAL HISTORY: Past Surgical History:  Procedure Laterality Date   GASTRIC BYPASS     HERNIA REPAIR     TONSILLECTOMY     URETEROSCOPY WITH HOLMIUM LASER LITHOTRIPSY Left     SOCIAL HISTORY: Social History   Socioeconomic History   Marital status: Widowed    Spouse name: Not on file   Number of children: Not on file   Years of education: Not on file   Highest education level: Not on file  Occupational History   Not on file  Tobacco Use   Smoking status: Never   Smokeless tobacco: Never  Vaping Use   Vaping Use: Never used  Substance and Sexual Activity   Alcohol use: Not Currently   Drug use: Not Currently   Sexual activity: Not Currently  Other Topics Concern   Not on file  Social History Narrative   Not on file   Social Determinants of Health   Financial Resource Strain: Not on file  Food Insecurity: Not on file  Transportation Needs: Not on file  Physical Activity: Not on file  Stress: Not on file  Social Connections: Not on file  Intimate Partner Violence: Not on file    FAMILY HISTORY: Family History  Problem Relation Age of Onset   Breast cancer Mother    Bladder Cancer Neg Hx    Kidney cancer Neg Hx    Prostate cancer Neg Hx     ALLERGIES:  is allergic to kiwi extract.  MEDICATIONS:  Current Outpatient Medications  Medication Sig Dispense Refill   apixaban (ELIQUIS) 5 MG TABS tablet Take 1 tablet (5 mg total) by  mouth 2 (two) times daily. 60 tablet 2   cabozantinib (CABOMETYX) 40 MG tablet Take 1 tablet (40 mg total) by mouth daily. Take on an empty stomach, 1 hour before or 2 hours after meals. 30 tablet 0   cetirizine (ZYRTEC) 10 MG tablet Take 0.5 tablets (5 mg total) by mouth daily. 15 tablet 0   cyanocobalamin (VITAMIN B12) 1000 MCG/ML injection Inject into the muscle.     cyanocobalamin 1000 MCG tablet Take 1,000 mcg by mouth daily.     diltiazem (CARDIZEM CD) 240 MG 24 hr capsule Take 1 capsule (240 mg total) by mouth every evening. 30 capsule 2   escitalopram (LEXAPRO) 10 MG tablet Take 10 mg by mouth daily.     insulin glargine (LANTUS) 100 UNIT/ML injection Inject 0.25 mLs (25 Units total) into the skin 2 (two) times daily. (Patient taking differently: Inject 22 Units into the skin daily.) 10 mL 11   metFORMIN (GLUCOPHAGE-XR) 500 MG 24 hr tablet Take 1 tablet (500 mg total) by mouth 2 (two) times daily. (Patient taking differently: Take 1,000 mg by mouth daily.) 60 tablet 2   metoprolol succinate (TOPROL-XL) 25 MG 24 hr tablet Take 25 mg by mouth daily.     nystatin (MYCOSTATIN/NYSTOP) powder Apply 1 Application topically 2 (two) times daily. Apply to abdominal fold and groin in the mornings  Applyunder both breasts at night     ondansetron (ZOFRAN) 8 MG tablet Take 1 tablet (8 mg total) by mouth every 8 (eight) hours as needed for nausea or vomiting. 60 tablet 0   polyethylene glycol (MIRALAX / GLYCOLAX) 17 g packet Take 17 g by mouth daily as needed. (Patient not taking: Reported on 07/14/2022)     No current facility-administered medications for this visit.     PHYSICAL EXAMINATION: ECOG PERFORMANCE STATUS: 2 - Symptomatic, <50% confined to bed Vitals:   07/22/22 0934  BP: 110/61  Pulse: 63  Resp: 17  Temp: 97.7 F (36.5 C)  SpO2: 100%   Filed Weights   07/22/22 0934  Weight: 154 lb 12.8 oz (70.2 kg)   Physical Exam Vitals reviewed.  Constitutional:      General: She is not  in acute distress.    Appearance: She is not ill-appearing.     Comments: Frail elderly female, she ambulates with a walker. Accompanied by [name]  HENT:     Head: Normocephalic and atraumatic.  Cardiovascular:     Rate and Rhythm: Normal rate. Rhythm irregular.     Heart sounds: Normal heart sounds.  Pulmonary:     Effort: Pulmonary effort is normal. No respiratory distress.     Breath sounds: No wheezing.  Abdominal:     General: There is no distension.     Palpations: Abdomen is soft.  Musculoskeletal:        General: No deformity. Normal range of motion.  Cervical back: Normal range of motion and neck supple.  Skin:    General: Skin is warm and dry.     Findings: No erythema or rash.  Neurological:     Mental Status: She is alert. Mental status is at baseline.     Cranial Nerves: No cranial nerve deficit.     Comments: Tangential  Psychiatric:        Mood and Affect: Mood normal.     LABORATORY DATA:  I have reviewed the data as listed    Latest Ref Rng & Units 07/22/2022    9:09 AM 07/16/2022    8:38 AM 07/14/2022    1:05 PM  CBC  WBC 4.0 - 10.5 K/uL 4.6  4.8  5.9   Hemoglobin 12.0 - 15.0 g/dL 14.0  13.9  15.5   Hematocrit 36.0 - 46.0 % 44.0  43.9  48.8   Platelets 150 - 400 K/uL 166  156  188       Latest Ref Rng & Units 07/16/2022    8:38 AM 07/14/2022    1:05 PM 06/09/2022    1:39 PM  CMP  Glucose 70 - 99 mg/dL 172  245  351   BUN 8 - 23 mg/dL _0 Creatinine 0.44 - 1.00 mg/dL 0.46  0.68  0.63   Sodium 135 - 145 mmol/L 138  137  137   Potassium 3.5 - 5.1 mmol/L 4.0  3.9  4.5   Chloride 98 - 111 mmol/L 105  102  103   CO2 22 - 32 mmol/L _1 Calcium 8.9 - 10.3 mg/dL 9.6  10.4  9.8   Total Protein 6.5 - 8.1 g/dL 6.0  6.5  6.3   Total Bilirubin 0.3 - 1.2 mg/dL 0.6  0.6  0.3   Alkaline Phos 38 - 126 U/L 89  95  132   AST 15 - 41 U/L 29  42  29   ALT 0 - 44 U/L _2 RADIOGRAPHIC STUDIES: I have personally reviewed the  radiological images as listed and agreed with the findings in the report. CT CHEST ABDOMEN PELVIS W CONTRAST  Result Date: 07/20/2022 CLINICAL DATA:  Metastatic right renal cell carcinoma with history of immunotherapy. Restaging. * Tracking Code: BO * EXAM: CT CHEST, ABDOMEN, AND PELVIS WITH CONTRAST TECHNIQUE: Multidetector CT imaging of the chest, abdomen and pelvis was performed following the standard protocol during bolus administration of intravenous contrast. RADIATION DOSE REDUCTION: This exam was performed according to the departmental dose-optimization program which includes automated exposure control, adjustment of the mA and/or kV according to patient size and/or use of iterative reconstruction technique. CONTRAST:  133m OMNIPAQUE IOHEXOL 300 MG/ML  SOLN COMPARISON:  03/22/2022 chest CT angiogram and CT abdomen/pelvis. FINDINGS: CT CHEST FINDINGS Cardiovascular: Normal heart size. No significant pericardial effusion/thickening. Left anterior descending and left circumflex coronary atherosclerosis. Atherosclerotic nonaneurysmal thoracic aorta. Prominently dilated main pulmonary artery (4.1 cm diameter). No central pulmonary emboli. Mediastinum/Nodes: Hypodense 1.0 cm left thyroid nodule, not definitely seen on prior imaging (series 2/image 9). Unremarkable esophagus. No pathologically enlarged axillary, mediastinal or hilar lymph nodes. Lungs/Pleura: No pneumothorax. No pleural effusion. No acute consolidative airspace disease or lung masses. Medial left lower lobe 0.6 cm solid pulmonary nodule (series 4/image 97), increased from 0.4 cm on 03/22/2022 and 0.3 cm on 01/19/2022 chest CT. No new significant pulmonary nodules. Previously visualized 0.7  cm right lower lobe solid nodule on 01/19/2022 chest CT has resolved. Mild new patchy ground-glass opacity in both lungs, most prominent in the right lower lobe. A few additional tiny solid pulmonary nodules in both lungs measuring up to 0.3 cm in the left  lower lobe (series 4/image 62), stable back to 01/19/2022 CT. Musculoskeletal: No aggressive appearing focal osseous lesions. Small lytic posterior T12 vertebral lesion (series 6/image 87), unchanged back to 11/04/2020 PET-CT, where it was non hypermetabolic, considered benign. Healing minimally displaced subacute lateral left fourth and fifth rib fractures. Mild chronic superior T12 vertebral compression fracture. CT ABDOMEN PELVIS FINDINGS Hepatobiliary: Normal liver with no liver mass. Cholecystectomy. No biliary ductal dilatation. Pancreas: Normal, with no mass or duct dilation. Spleen: Normal size. No mass. Adrenals/Urinary Tract: Normal adrenals. Lobulated heterogeneously enhancing 6.5 x 4.5 cm anterior mid upper right renal cortical mass (series 7/image 22), previously 7.6 x 6.0 cm on 01/19/2022 CT, mildly decreased. No hydronephrosis. No left renal masses. Normal bladder. 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 Irregular avidly enhancing nodular wall thickening in the medial wall of the duodenal bulb measuring up to the 2.4 cm (series 2/image 56), appearing increased from 1.4 cm on 01/09/2022 CT. No dilated small bowel loops. No additional sites of small bowel wall thickening. Normal appendix. No large bowel wall thickening or acute pericolonic fat stranding. Vascular/Lymphatic: Atherosclerotic nonaneurysmal abdominal aorta. Patent portal, splenic, hepatic and renal veins. No definite renal vein tumor thrombus. Right retrocaval/paracaval adenopathy up to 1.4 cm (series 2/image 55), decreased from 2.6 cm on 01/19/2022 CT. No pelvic adenopathy. Reproductive: Partially calcified multilocular complex cystic right adnexal 4.6 x 4.5 cm mass (series 2/image 89), increased from 4.3 x 3.2 cm. No left adnexal masses. Other: No pneumoperitoneum. No ascites. No fluid collections. Several peripherally calcified nodules scattered throughout the  peritoneal cavity, for example 2.2 cm in the pelvic cul-de-sac (series 2/image 99), stable from 01/19/2022 CT. Lateral left peritoneal cavity 1.3 cm calcified nodule (series 2/image 75), stable. Fine nodularity in calcification in the right paracolic gutter his similar. Right lower quadrant calcified 1.5 cm nodule (series 2/image 82), stable. Musculoskeletal: No aggressive appearing focal osseous lesions. Moderate lumbar spondylosis. IMPRESSION: 1. Mixed interval changes. 2. Irregular avidly enhancing nodular wall thickening in the medial wall of the duodenal bulb, appearing increased, cannot exclude metastasis versus primary duodenal neoplasm. 3. Medial left lower lobe 0.6 cm solid pulmonary nodule, increased mildly on consecutive chest CT studies, suspicious for growing pulmonary metastasis. 4. Hypodense 1.0 cm left thyroid nodule, not definitely seen on prior imaging, cannot exclude small thyroid metastasis. 5. Lobulated heterogeneously enhancing 6.5 cm anterior mid/upper right renal cortical mass, compatible with primary renal cell carcinoma, mildly decreased in size since 01/19/2022 CT. 6. Right retrocaval/paracaval adenopathy is decreased since 01/19/2022 CT. 7. Partially calcified multilocular complex cystic right adnexal 4.6 cm mass, increased in size, cannot exclude a separate cystic right ovarian malignancy. Several calcified nodules scattered in the peritoneal cavity, not substantially changed, suspicious for peritoneal metastases. No ascites. 8. Mild new patchy ground-glass opacity in both lungs, probably inflammatory. 9. Prominently dilated main pulmonary artery, suggesting pulmonary arterial hypertension. 10. Two-vessel coronary atherosclerosis. 11. Healing minimally displaced subacute lateral left fourth and fifth rib fractures. 12.  Aortic Atherosclerosis (ICD10-I70.0). Electronically Signed   By: Ilona Sorrel M.D.   On: 07/20/2022 20:38    ASSESSMENT & PLAN:   Cancer Staging  Renal cell  carcinoma ([place]) Staging form: Kidney, AJCC 8th Edition -  Clinical stage from 12/24/2020: Stage IV (cT1b, cN1, cM1) - Signed by [name], MD on 12/24/2020  Renal cell carcinoma- right RCC with retroperitoneal lymphadenopathy, lung mass, and peritoneal nodularity. Currently on 3rd light cabozanitinib 40 mg daily, on hold d/t intolerance. CT recently for restaging. Will defer discussion of results to [name] whom patient is seeing next week.   Dehydration- weight up. No vomiting. Eating and drinking well. Labs reassuring. No evidence of hemoconcentration or kidney elevations. Hold iv fluids today.  Nausea and vomiting- zofran 8 mg TID prn. Well controlled Encephalopathy- MRI brain planned for 11/20 with follow up with [name] following.  Elevated blood sugars- glucose 253 this morning. Encouraged dietary discretion.   Disposition:  Follow up with [name] as scheduled- la  All questions were answered. The patient knows to call the clinic with any problems, questions or concerns.  Beckey Rutter, DNP, AGNP-C [place] [facility] [phone] (clinic)

## 2022-07-26 ENCOUNTER — Other Ambulatory Visit (HOSPITAL_COMMUNITY): Payer: Self-pay

## 2022-07-26 ENCOUNTER — Ambulatory Visit
Admission: RE | Admit: 2022-07-26 | Discharge: 2022-07-26 | Disposition: A | Discharge status: Home / Self Care | Payer: Medicare PPO | Source: Ambulatory Visit | Attending: Oncology | Admitting: Oncology

## 2022-07-26 DIAGNOSIS — C641 Malignant neoplasm of right kidney, except renal pelvis: Secondary | ICD-10-CM | POA: Insufficient documentation

## 2022-07-26 MED ORDER — GADOBUTROL 1 MMOL/ML IV SOLN
7.0000 mL | Freq: Once | INTRAVENOUS | Status: AC | PRN
  Administered 2022-07-26: 7 mL via INTRAVENOUS

## 2022-07-28 ENCOUNTER — Inpatient Hospital Stay: Payer: Medicare PPO

## 2022-07-28 ENCOUNTER — Encounter: Payer: Self-pay | Admitting: Oncology

## 2022-07-28 ENCOUNTER — Inpatient Hospital Stay (HOSPITAL_BASED_OUTPATIENT_CLINIC_OR_DEPARTMENT_OTHER): Payer: Medicare PPO | Admitting: Oncology

## 2022-07-28 ENCOUNTER — Other Ambulatory Visit: Payer: Self-pay | Admitting: Oncology

## 2022-07-28 VITALS — BP 128/79 | HR 67 | Temp 96.2°F | Resp 18 | Wt 159.4 lb

## 2022-07-28 DIAGNOSIS — C641 Malignant neoplasm of right kidney, except renal pelvis: Secondary | ICD-10-CM

## 2022-07-28 DIAGNOSIS — N9489 Other specified conditions associated with female genital organs and menstrual cycle: Secondary | ICD-10-CM

## 2022-07-28 DIAGNOSIS — E119 Type 2 diabetes mellitus without complications: Secondary | ICD-10-CM | POA: Diagnosis not present

## 2022-07-28 DIAGNOSIS — G9340 Encephalopathy, unspecified: Secondary | ICD-10-CM

## 2022-07-28 DIAGNOSIS — E860 Dehydration: Secondary | ICD-10-CM

## 2022-07-28 DIAGNOSIS — Z794 Long term (current) use of insulin: Secondary | ICD-10-CM

## 2022-07-28 LAB — CBC WITH DIFFERENTIAL/PLATELET
Abs Immature Granulocytes: 0.01 10*3/uL (ref 0.00–0.07)
Basophils Absolute: 0 10*3/uL (ref 0.0–0.1)
Basophils Relative: 0 %
Eosinophils Absolute: 0.1 10*3/uL (ref 0.0–0.5)
Eosinophils Relative: 2 %
HCT: 40.1 % (ref 36.0–46.0)
Hemoglobin: 12.7 g/dL (ref 12.0–15.0)
Immature Granulocytes: 0 %
Lymphocytes Relative: 46 %
Lymphs Abs: 2 10*3/uL (ref 0.7–4.0)
MCH: 29.5 pg (ref 26.0–34.0)
MCHC: 31.7 g/dL (ref 30.0–36.0)
MCV: 93 fL (ref 80.0–100.0)
Monocytes Absolute: 0.4 10*3/uL (ref 0.1–1.0)
Monocytes Relative: 10 %
Neutro Abs: 1.9 10*3/uL (ref 1.7–7.7)
Neutrophils Relative %: 42 %
Platelets: 176 10*3/uL (ref 150–400)
RBC: 4.31 MIL/uL (ref 3.87–5.11)
RDW: 20 % — ABNORMAL HIGH (ref 11.5–15.5)
WBC: 4.4 10*3/uL (ref 4.0–10.5)
nRBC: 0 % (ref 0.0–0.2)

## 2022-07-28 LAB — COMPREHENSIVE METABOLIC PANEL
ALT: 17 U/L (ref 0–44)
AST: 21 U/L (ref 15–41)
Albumin: 3.1 g/dL — ABNORMAL LOW (ref 3.5–5.0)
Alkaline Phosphatase: 94 U/L (ref 38–126)
Anion gap: 4 — ABNORMAL LOW (ref 5–15)
BUN: 20 mg/dL (ref 8–23)
CO2: 31 mmol/L (ref 22–32)
Calcium: 10.6 mg/dL — ABNORMAL HIGH (ref 8.9–10.3)
Chloride: 101 mmol/L (ref 98–111)
Creatinine, Ser: 0.62 mg/dL (ref 0.44–1.00)
GFR, Estimated: >60 mL/min (ref >60)
Glucose, Bld: 340 mg/dL — ABNORMAL HIGH (ref 70–99)
Potassium: 4.9 mmol/L (ref 3.5–5.1)
Sodium: 136 mmol/L (ref 135–145)
Total Bilirubin: 0.5 mg/dL (ref 0.3–1.2)
Total Protein: 6.2 g/dL — ABNORMAL LOW (ref 6.5–8.1)

## 2022-07-28 LAB — LACTATE DEHYDROGENASE: LDH: 115 U/L (ref 98–192)

## 2022-07-28 MED ORDER — SODIUM CHLORIDE 0.9 % IV SOLN
Freq: Once | INTRAVENOUS | Status: AC
  Filled 2022-07-28: qty 250

## 2022-07-28 NOTE — Assessment & Plan Note (Signed)
Uncontrolled. Continue follow-up with primary care provider

## 2022-07-28 NOTE — Patient Instructions (Signed)
Dehydration, Adult Dehydration is condition in which there is not enough water or other fluids in the body. This happens when a person loses more fluids than he or she takes in. Important body parts cannot work right without the right amount of fluids. Any loss of fluids from the body can cause dehydration. Dehydration can be mild, worse, or very bad. It should be treated right away to keep it from getting very bad. What are the causes? This condition may be caused by: Conditions that cause loss of water or other fluids, such as: Watery poop (diarrhea). Vomiting. Sweating a lot. Peeing (urinating) a lot. Not drinking enough fluids, especially when you: Are ill. Are doing things that take a lot of energy to do. Other illnesses and conditions, such as fever or infection. Certain medicines, such as medicines that take extra fluid out of the body (diuretics). Lack of safe drinking water. Not being able to get enough water and food. What increases the risk? The following factors may make you more likely to develop this condition: Having a long-term (chronic) illness that has not been treated the right way, such as: Diabetes. Heart disease. Kidney disease. Being 65 years of age or older. Having a disability. Living in a place that is high above the ground or sea (high in altitude). The thinner, dried air causes more fluid loss. Doing exercises that put stress on your body for a long time. What are the signs or symptoms? Symptoms of dehydration depend on how bad it is. Mild or worse dehydration Thirst. Dry lips or dry mouth. Feeling dizzy or light-headed, especially when you stand up from sitting. Muscle cramps. Your body making: Dark pee (urine). Pee may be the color of tea. Less pee than normal. Less tears than normal. Headache. Very bad dehydration Changes in skin. Skin may: Be cold to the touch (clammy). Be blotchy or pale. Not go back to normal right after you lightly pinch  it and let it go. Little or no tears, pee, or sweat. Changes in vital signs, such as: Fast breathing. Low blood pressure. Weak pulse. Pulse that is more than 100 beats a minute when you are sitting still. Other changes, such as: Feeling very thirsty. Eyes that look hollow (sunken). Cold hands and feet. Being mixed up (confused). Being very tired (lethargic) or having trouble waking from sleep. Short-term weight loss. Loss of consciousness. How is this treated? Treatment for this condition depends on how bad it is. Treatment should start right away. Do not wait until your condition gets very bad. Very bad dehydration is an emergency. You will need to go to a hospital. Mild or worse dehydration can be treated at home. You may be asked to: Drink more fluids. Drink an oral rehydration solution (ORS). This drink helps get the right amounts of fluids and salts and minerals in the blood (electrolytes). Very bad dehydration can be treated: With fluids through an IV tube. By getting normal levels of salts and minerals in your blood. This is often done by giving salts and minerals through a tube. The tube is passed through your nose and into your stomach. By treating the root cause. Follow these instructions at home: Oral rehydration solution If told by your doctor, drink an ORS: Make an ORS. Use instructions on the package. Start by drinking small amounts, about  cup (120 mL) every 5-10 minutes. Slowly drink more until you have had the amount that your doctor said to have. Eating and drinking          Drink enough clear fluid to keep your pee pale yellow. If you were told to drink an ORS, finish the ORS first. Then, start slowly drinking other clear fluids. Drink fluids such as: Water. Do not drink only water. Doing that can make the salt (sodium) level in your body get too low. Water from ice chips you suck on. Fruit juice that you have added water to (diluted). Low-calorie sports  drinks. Eat foods that have the right amounts of salts and minerals, such as: Bananas. Oranges. Potatoes. Tomatoes. Spinach. Do not drink alcohol. Avoid: Drinks that have a lot of sugar. These include: High-calorie sports drinks. Fruit juice that you did not add water to. Soda. Caffeine. Foods that are greasy or have a lot of fat or sugar. General instructions Take over-the-counter and prescription medicines only as told by your doctor. Do not take salt tablets. Doing that can make the salt level in your body get too high. Return to your normal activities as told by your doctor. Ask your doctor what activities are safe for you. Keep all follow-up visits as told by your doctor. This is important. Contact a doctor if: You have pain in your belly (abdomen) and the pain: Gets worse. Stays in one place. You have a rash. You have a stiff neck. You get angry or annoyed (irritable) more easily than normal. You are more tired or have a harder time waking than normal. You feel: Weak or dizzy. Very thirsty. Get help right away if you have: Any symptoms of very bad dehydration. Symptoms of vomiting, such as: You cannot eat or drink without vomiting. Your vomiting gets worse or does not go away. Your vomit has blood or green stuff in it. Symptoms that get worse with treatment. A fever. A very bad headache. Problems with peeing or pooping (having a bowel movement), such as: Watery poop that gets worse or does not go away. Blood in your poop (stool). This may cause poop to look black and tarry. Not peeing in 6-8 hours. Peeing only a small amount of very dark pee in 6-8 hours. Trouble breathing. These symptoms may be an emergency. Do not wait to see if the symptoms will go away. Get medical help right away. Call your local emergency services (911 in the [place] Do not drive yourself to the hospital. Summary Dehydration is a condition in which there is not enough water or other fluids  in the body. This happens when a person loses more fluids than he or she takes in. Treatment for this condition depends on how bad it is. Treatment should be started right away. Do not wait until your condition gets very bad. Drink enough clear fluid to keep your pee pale yellow. If you were told to drink an oral rehydration solution (ORS), finish the ORS first. Then, start slowly drinking other clear fluids. Take over-the-counter and prescription medicines only as told by your doctor. Get help right away if you have any symptoms of very bad dehydration. This information is not intended to replace advice given to you by your health care provider. Make sure you discuss any questions you have with your health care provider. Document Revised: 12/30/2021 Document Reviewed: 04/05/2019 [employer] Patient Education  2023 [employer]  

## 2022-07-28 NOTE — Assessment & Plan Note (Signed)
Brain MRI did not show any intracranial metastasis. 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

## 2022-07-28 NOTE — Assessment & Plan Note (Signed)
Possibly due to dehydration/decreased oral intake versus malignant hypercalcemia. Recommend IV fluid 1 L of normal saline x 1 today. Repeat BMP next week, if persistently elevated, will proceed with Zometa.

## 2022-07-28 NOTE — Progress Notes (Signed)
Hematology/Oncology Progress note [phone] [phone]      Patient Care Team: Virgie Dad, FNP as PCP - General (Family Medicine) [name], MD as Consulting Physician (Hematology and Oncology)    ASSESSMENT & PLAN:   Cancer Staging  Renal cell carcinoma [facility]) Staging form: Kidney, AJCC 8th Edition - Clinical stage from 12/24/2020: Stage IV (cT1b, cN1, cM1) - Signed by [name], MD on 12/24/2020   Renal cell carcinoma ([place]) Right RCC  with retroperitoneal lymphadenopathy, lung mass and peritoneal nodularity Progressed on 1st line Keytruda, did not tolerate 2nd line Axitinib 37m BID- encephalopathy Labs reviewed and discussed with patient.  She did not tolerate cabozantinib, 40 mg daily.  Currently she is off treatments. Repeat CT chest abdomen pelvis results were reviewed and discussed with patient. Patient has mixed response. Given patient's multiple comorbidities, poor performance status, I do not think she is a candidate for any additional aggressive chemotherapy treatment.  Recommend patient to have a follow-up appointment with palliative care service and discussion of goals of care.  Recommend transition to comfort care/hospice  Encephalopathy Brain MRI did not show any intracranial metastasis. Persistent encephalopathy, consider underlying undiagnosed dementia, or other neurological disorders  Type 2 diabetes mellitus without complication, with long-term current use of insulin (HCC) Uncontrolled. Continue follow-up with primary care provider  Adnexal mass Check CA125, HE4 Poor candidate for aggressive treatment anyway.  Will hold off aggressive workup, pending her meeting with palliative care service.  Hypercalcemia Possibly due to dehydration/decreased oral intake versus malignant hypercalcemia. Recommend IV fluid 1 L of normal saline x 1 today. Repeat BMP next week, if persistently elevated, will proceed with Zometa.    Orders Placed  This Encounter  Procedures   Basic metabolic panel    Standing Status:   Future    Standing Expiration Date:   07/28/2023    All questions were answered. The patient knows to call the clinic with any problems, questions or concerns.  ZEarlie Server MD, PhD [facility] H[facility] Oncology 07/28/2022   CHIEF COMPLAINTS/REASON FOR VISIT:  Metastatic RCC  HISTORY OF PRESENTING ILLNESS:   Patient is a poor historian. Her mood appears to be depressed.  She [place] Her power of attorney is JDrucie Ipwho is her husband's niece. JAlmyra Freewas called during this encounter.   Extensive medical record review was performed. She has had multiple images done during the past few months, 05/27/20 CT head wo contrast- no acute intracranial abnormality.  06/01/20 CT abdomen and pelvis with contrast 4 mm proximal left ureteral calculus causing mild proximal left- sided hydroureteronephrosis with periureteric and perinephric stranding on the left.5.2cm enhancing right mid anterior kidney mass. Mildly enlarged right retroperitoneal retrocaval lymph nodes suspicious for nodal metastases Mild cardiomegaly, Small hiatal hernia. Status post gastric bypass surgery and cholecystectomy. Postoperative changes anterior abdominal wall. 06/05/20 CT head wo contrast - No acute intracranial abnormality. Probable tiny right frontal scalp Contusion. 2. Mild global volume loss and minimal chronic ischemic small vessel disease. 06/06/20 CT abdomen pelvis wo contrast 1. Indeterminate lesion in the right kidney which is stable may be a solid mass., 2. Partially calcified uterine fibroids. 3. Left UVJ calculus measuring 5 mm 06/07/20 CT head wo contrast.  1. No acute intracranial abnormality. No significant change. 2. Intracranial atherosclerosis, atrophy and chronic small vessel ischemic changes. 3. Chronic/subacute right maxillary sinus disease.  06/07/20 Cystoscopy by  DEverardo BealsAArnell Sievingfor Ureteroscopic stone extraction Admitted on 10/3/21due to failure  to thrive,  anorexia, dehydration, electrolyte imbalances.   06/09/20 CT abdomen and chest with contrast 1. Right renal mass is stable in size. Most suggestive of a renal neoplasm. 2. Right retroperitoneal lymph nodes are stable in size; may well be metastatic. 3. Left hydronephrosis and hydroureter is stable. Pelvis is not imaged therefore if there is a distal calculus still present, it is not visible. 4. Sclerotic lesions in the anterior left seventh, eighth and ninth ribs which given distribution are most likely healed rib fractures.  She is on Eliquis 38m Bid for atrial fibrillation. She reports not taking anti depressants that were given by her PCP.  #11/04/2020, PET scan was obtained. PET scan showed interval increasing size of right upper lobe pulmonary lesion, now 3 cm with an SUV of 3.8.  Associated with a new right lower lobe nodule 8 mm.  5.1 cm right renal mass hypermetabolic activity.  1.9 retrocaval lymph node with SUV of 4.8.  Second index retrocaval node with SUV of 2.  Nodular disease seen in the duodenum on previous study is not well demonstrated on noncontrast CT image today.  Some nodular FDG activity, nonspecific. Multiple anterior abdominal soft tissue nodules are evident.  13 mm anterior left nodule shows no substantial hypermetabolic with an SUV of 1.7.  Calcified left lateral nodule with an SUV of 2.9.  2.2 cm calcified nodule lobe right paracolic gutter with an SUV of 2.5.  12/17/2020, right kidney mass biopsy pathology was reviewed and discussed with patient.  Pathology is consistent with RCC-demonstrates the morphology spectrum, papillary, solid and nested areas.  CD10 positive, racemase positive, CK7 negative.  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  TFE 3 break apart FISH testing is pending.  #01/05/2021, addendum pathology TFE3 break-apart FISH testing was  performed and a translocation  involving Xp11 / TFE3 transcription gene was not detected. HMB-45 IHC is negative  CAIX IHC demonstrates membranous staining.  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  MRI brain was obtained during the admission and was negative for acute process.  Patient mental status improved.  UTI was treated with IV antibiotics and switched to p.o. antibiotic upon discharge.  Patient also was found to have rapid ventricular response with her atrial fibrillation.  Rate was treated with Cardizem drip during hospitalization because and switched to Toprol and Cardizem for rate control.  Eliquis for anticoagulation. Patient's axitinib was held during hospitalization.  There was plan for patient to restart axitinib upon discharge.  However since patient was sent to rehab, he is not able to be started on axitinib due to lack of access to her medication supply.  [facility]  Patient presents for post hospitalization follow-up.  She does not have much recollection of her recent admission.  Reports feeling well today.  He denies any fever, chills, nausea vomiting diarrhea.  03/07/2021, CT abdomen pelvis  Right renal mass slightly enlarged since 10/03/2020.  5.4 x 4.9 x 6.4 comparing to previously 5.0 x 4.4 x 6.3.  Stable adjacent right retroperitoneal lymphadenopathy.  There is a new 1.5 cm nodule in the right lower lobe.  Mild stranding around the right renal mass.  04/01/2022 resumed Axitinib 52m BID 05/06/2022 she presented for follow up, she stopped " all her medications". Tachycardia, hyperglycemia, she declined ER evaluation.   06/03/2022, presented for follow up.  Multiple no show to her CT scan. Palliative care service has not been able to reach her  06/23/2021, CT chest abdomen pelvis showed stable large right anterior renal mass.  Stable to slightly larger right-sided retroperitoneal adenopathy.  Waxing and waning pulmonary nodules.  2 lower lobe nodules resolved.  New right middle lobe and right upper lobe nodules.  06/29/2022  Hold chemotherapy due to hyperglycemia.  Blood sugar was 431, due to noncompliance.  Recommend patient to go to emergency room for evaluation and she declines. talked to patient's PCP TOlivia Mackieover the phone and discussed patient's case with her. Patient is not complaint with her medication. Her diabetes was poorly controlled and glucose was extremely high, she refused to go to ER for management. She is also extremely depressed. Most recent CT scan shows stable disease. We both agree that patient is not able to take care of herself.  TOlivia Mackiewill try to get her to see psychiatrist for evaluation of her depression,she may benefit from living in a facility as well. From Oncology aspect, her prognosis is poor, and I will hold off additional treatment until her other acute medical conditions are stabilized. Hospice/palliative care is reasonable if her condition continues to deteriorate. TOlivia Mackiewill update me if she improves. .   07/23/2021 - 07/31/2021, hospitalized due to A. fib with RVR. 09/09/2021 - 09/14/2021, the patient due to A. fib with RVR, uncontrolled diabetes with glucose of 500s, DKA. 09/09/2021, CT angio chest PE protocol showed interval decrease of the right upper lobe nodularity.  No PE.  10/09/2021 , re-establish care, she expressed interests of resuming RCC treatments. She agrees with getting CT restaging. She reports being complaint with her medication.   10/28/21 CT showed progression of right renal mass, retroperitoneal lymphadenopathy. Stable small lung nodules. Cardiomegaly with biatrial  dilatation. Aortic atherosclerosis. calcifications of the aortic valve, Dilatation of the pulmonic trunk   01/20/2022, CT chest abdomen pelvis with contrast showed heterogeneous mass of the anterior midportion of the right kidney is slightly increased in size, as is an adjacent right retroperitoneal lymph node.  Consistent with disease progression.  Findings are consistent with worsened renal cell carcinoma and adjacent nodal metastatic disease. New pulmonary nodule of the superior segment right lower lobe measuring 0.7 cm, highly suspicious for a small metastasis. Multiple additional tiny pulmonary nodules are unchanged and likely benign and incidental. Attention on follow-up. Coronary artery disease.  03/08/2022-03/22/22   axitinib 5 mg twice daily. 03/22/2022-03/26/2022 patient was admitted due to altered mental status, delirium.  Urine culture was negative.  MRI brain with contrast was negative for metastasis or stroke.  CT abdomen pelvis and chest showed pulmonary nodules and existing renal mass. Due to concern of possible encephalopathy secondary to axitinib, axitinib was discontinued  04/28/2022 cabozantinib 20 mg daily.  Patient tolerated well. 05/19/2022, cabozantinib increased to 40 mg daily. 07/14/2022, stopped cabozantinib.  07/26/2022, MRI brain with and without contrast did not show any intracranial metastasis.  Mild chronic small vessel ischemic changes.  Mild generalized cerebral atrophy.  Mild paranasal sinus mucosal thickening.  Trace left mastoid effusion.  07/20/2022, CT chest abdomen pelvis with contrast showed 1. Mixed interval changes. 2. Irregular avidly enhancing nodular wall thickening in the medial wall of the duodenal bulb, appearing increased, cannot exclude metastasis versus primary duodenal neoplasm. 3. Medial left  lower lobe 0.6 cm solid pulmonary nodule, increased mildly on consecutive chest CT studies, suspicious for growing pulmonary metastasis. 4. Hypodense 1.0 cm left  thyroid nodule, not definitely seen on prior imaging, cannot exclude small thyroid metastasis. 5. Lobulated heterogeneously enhancing 6.5 cm anterior mid/upper right renal cortical mass, compatible with primary renal cell carcinoma, mildly decreased in size since 01/19/2022 CT. 6. Right retrocaval/paracaval adenopathy is decreased since 01/19/2022 CT. 7. Partially calcified multilocular complex cystic right adnexal 4.6 cm mass, increased in size, cannot exclude a separate cystic right ovarian malignancy. Several calcified nodules scattered in the peritoneal cavity, not substantially changed, suspicious for peritoneal metastases. No ascites. 8. Mild new patchy ground-glass opacity in both lungs, probably inflammatory. 9. Prominently dilated main pulmonary artery, suggesting pulmonary arterial hypertension. 10. Two-vessel coronary atherosclerosis. 11. Healing minimally displaced subacute lateral left fourth and fifth rib fractures. 12.  Aortic Atherosclerosis (ICD10-I70.0).  INTERVAL HISTORY Barbara Frank is a 76 y.o. female who has above history reviewed by me today presents for follow up visit for management of metastatic renal clear-cell carcinoma Today she is accompanied by her friend [name] Nausea vomiting has improved.  She feels better since the discontinuation of cabozantinib.  She has gained weight. Mental status improved, per [name], mental status about 60% of her previous baseline.  Patient is a poor historian.  Review of Systems  Constitutional:  Positive for fatigue. Negative for appetite change, chills and fever.  HENT:   Negative for hearing loss and voice change.   Eyes:  Negative for eye problems.  Respiratory:  Negative for chest tightness and cough.   Cardiovascular:  Negative for chest pain.  Gastrointestinal:  Negative for abdominal distention, abdominal pain, blood in stool, constipation and diarrhea.  Endocrine: Negative for hot flashes.  Genitourinary:  Negative for  difficulty urinating and frequency.   Musculoskeletal:  Positive for back pain. Negative for arthralgias.  Skin:  Negative for itching and rash.  Neurological:  Negative for extremity weakness.  Hematological:  Negative for adenopathy.  Psychiatric/Behavioral:  Positive for confusion and depression.     MEDICAL HISTORY:  Past Medical History:  Diagnosis Date   A-fib ([place])    Depression    Diabetes mellitus without complication ([place])    History of kidney stones    Kidney mass     SURGICAL HISTORY: Past Surgical History:  Procedure Laterality Date   GASTRIC BYPASS     HERNIA REPAIR     TONSILLECTOMY     URETEROSCOPY WITH HOLMIUM LASER LITHOTRIPSY Left     SOCIAL HISTORY: Social History   Socioeconomic History   Marital status: Widowed    Spouse name: Not on file   Number of children: Not on file   Years of education: Not on file   Highest education level: Not on file  Occupational History   Not on file  Tobacco Use   Smoking status: Never   Smokeless tobacco: Never  Vaping Use   Vaping Use: Never used  Substance and Sexual Activity   Alcohol use: Not Currently   Drug use: Not Currently   Sexual activity: Not Currently  Other Topics Concern   Not on file  Social History Narrative   Not on file   Social Determinants of Health   Financial Resource Strain: Not on file  Food Insecurity: Not on file  Transportation Needs: Not on file  Physical Activity: Not on file  Stress: Not on file  Social Connections: Not on file  Intimate Partner Violence: Not on  file    FAMILY HISTORY: Family History  Problem Relation Age of Onset   Breast cancer Mother    Bladder Cancer Neg Hx    Kidney cancer Neg Hx    Prostate cancer Neg Hx     ALLERGIES:  is allergic to kiwi extract.  MEDICATIONS:  Current Outpatient Medications  Medication Sig Dispense Refill   apixaban (ELIQUIS) 5 MG TABS tablet Take 1 tablet (5 mg total) by mouth 2 (two) times daily. 60 tablet 2    cetirizine (ZYRTEC) 10 MG tablet Take 0.5 tablets (5 mg total) by mouth daily. 15 tablet 0   cyanocobalamin (VITAMIN B12) 1000 MCG/ML injection Inject into the muscle.     cyanocobalamin 1000 MCG tablet Take 1,000 mcg by mouth daily.     diltiazem (CARDIZEM CD) 240 MG 24 hr capsule Take 1 capsule (240 mg total) by mouth every evening. 30 capsule 2   escitalopram (LEXAPRO) 10 MG tablet Take 10 mg by mouth daily.     insulin glargine (LANTUS) 100 UNIT/ML injection Inject 0.25 mLs (25 Units total) into the skin 2 (two) times daily. (Patient taking differently: Inject 22 Units into the skin daily.) 10 mL 11   metFORMIN (GLUCOPHAGE-XR) 500 MG 24 hr tablet Take 1 tablet (500 mg total) by mouth 2 (two) times daily. (Patient taking differently: Take 1,000 mg by mouth daily.) 60 tablet 2   metoprolol succinate (TOPROL-XL) 25 MG 24 hr tablet Take 25 mg by mouth daily.     nystatin (MYCOSTATIN/NYSTOP) powder Apply 1 Application topically 2 (two) times daily. Apply to abdominal fold and groin in the mornings  Applyunder both breasts at night     ondansetron (ZOFRAN) 8 MG tablet Take 1 tablet (8 mg total) by mouth every 8 (eight) hours as needed for nausea or vomiting. 60 tablet 0   polyethylene glycol (MIRALAX / GLYCOLAX) 17 g packet Take 17 g by mouth daily as needed. (Patient not taking: Reported on 07/14/2022)     No current facility-administered medications for this visit.     PHYSICAL EXAMINATION: ECOG PERFORMANCE STATUS: 2 - Symptomatic, <50% confined to bed Vitals:   07/28/22 1035  BP: 128/79  Pulse: 67  Resp: 18  Temp: (!) 96.2 F (35.7 C)   Filed Weights   07/28/22 1035  Weight: 159 lb 6.4 oz (72.3 kg)    Physical Exam Constitutional:      General: She is not in acute distress.    Comments: Frail elderly female, she ambulates with a walker.   HENT:     Head: Normocephalic and atraumatic.  Eyes:     General: No scleral icterus. Cardiovascular:     Rate and Rhythm: Normal rate.  Rhythm irregular.     Heart sounds: Normal heart sounds.  Pulmonary:     Effort: Pulmonary effort is normal. No respiratory distress.     Breath sounds: No wheezing.  Abdominal:     General: Bowel sounds are normal. There is no distension.     Palpations: Abdomen is soft.  Musculoskeletal:        General: No deformity. Normal range of motion.     Cervical back: Normal range of motion and neck supple.  Skin:    General: Skin is warm and dry.     Findings: No erythema or rash.  Neurological:     Mental Status: She is alert.     Cranial Nerves: No cranial nerve deficit.     Comments: Slow speech  Psychiatric:  Mood and Affect: Mood normal.     LABORATORY DATA:  I have reviewed the data as listed    Latest Ref Rng & Units 07/28/2022   10:13 AM 07/22/2022    9:09 AM 07/16/2022    8:38 AM  CBC  WBC 4.0 - 10.5 K/uL 4.4  4.6  4.8   Hemoglobin 12.0 - 15.0 g/dL 12.7  14.0  13.9   Hematocrit 36.0 - 46.0 % 40.1  44.0  43.9   Platelets 150 - 400 K/uL 176  166  156       Latest Ref Rng & Units 07/28/2022   10:13 AM 07/22/2022    9:09 AM 07/16/2022    8:38 AM  CMP  Glucose 70 - 99 mg/dL 340  253  172   BUN 8 - 23 mg/dL _0 Creatinine 0.44 - 1.00 mg/dL 0.62  0.60  0.46   Sodium 135 - 145 mmol/L 136  138  138   Potassium 3.5 - 5.1 mmol/L 4.9  4.1  4.0   Chloride 98 - 111 mmol/L 101  103  105   CO2 22 - 32 mmol/L _1 Calcium 8.9 - 10.3 mg/dL 10.6  10.0  9.6   Total Protein 6.5 - 8.1 g/dL 6.2  6.3  6.0   Total Bilirubin 0.3 - 1.2 mg/dL 0.5  0.9  0.6   Alkaline Phos 38 - 126 U/L 94  77  89   AST 15 - 41 U/L _2 ALT 0 - 44 U/L _3 RADIOGRAPHIC STUDIES: I have personally reviewed the radiological images as listed and agreed with the findings in the report. MR Brain W Wo Contrast  Result Date: 07/27/2022 CLINICAL DATA:  Provided history: Renal cell carcinoma of right kidney. Additional history provided by scanning technologist:  Patient reports dizziness. EXAM: MRI HEAD WITHOUT AND WITH CONTRAST TECHNIQUE: Multiplanar, multiecho pulse sequences of the brain and surrounding structures were obtained without and with intravenous contrast. CONTRAST:  12m GADAVIST GADOBUTROL 1 MMOL/ML IV SOLN COMPARISON:  Head CT 05/26/2022.  Brain MRI 03/22/2022. FINDINGS: Brain: Mild generalized cerebral atrophy. Multifocal T2 FLAIR hyperintense signal abnormality within the cerebral white matter, nonspecific but compatible with mild chronic small vessel ischemic disease. There is no acute infarct. No evidence of an intracranial mass. No chronic intracranial blood products. No extra-axial fluid collection. No midline shift. No pathologic intracranial enhancement identified. Vascular: Maintained flow voids within the proximal large arterial vessels. Small developmental venous anomaly within the anteroinferior left frontal lobe (anatomic variant). Skull and upper cervical spine: No focal suspicious marrow lesion. Sinuses/Orbits: No mass or acute finding within the imaged orbits. Mild mucosal thickening within the bilateral ethmoid and maxillary sinuses. Other: Trace fluid within left mastoid air cells. IMPRESSION: 1. No evidence of intracranial metastatic disease. 2. Mild chronic small vessel ischemic changes within the cerebral white matter, similar to the prior MRI of 03/22/2022. 3. Mild generalized cerebral atrophy. 4. Mild paranasal sinus mucosal thickening. 5. Trace left mastoid effusion. Electronically Signed   By: KKellie SimmeringD.O.   On: 07/27/2022 08:14   CT CHEST ABDOMEN PELVIS W CONTRAST  Result Date: 07/20/2022 CLINICAL DATA:  Metastatic right renal cell carcinoma with history of immunotherapy. Restaging. * Tracking Code: BO * EXAM: CT CHEST, ABDOMEN, AND PELVIS WITH CONTRAST TECHNIQUE: Multidetector CT imaging of the chest, abdomen and pelvis  was performed following the standard protocol during bolus administration of intravenous contrast.  RADIATION DOSE REDUCTION: This exam was performed according to the departmental dose-optimization program which includes automated exposure control, adjustment of the mA and/or kV according to patient size and/or use of iterative reconstruction technique. CONTRAST:  128m OMNIPAQUE IOHEXOL 300 MG/ML  SOLN COMPARISON:  03/22/2022 chest CT angiogram and CT abdomen/pelvis. FINDINGS: CT CHEST FINDINGS Cardiovascular: Normal heart size. No significant pericardial effusion/thickening. Left anterior descending and left circumflex coronary atherosclerosis. Atherosclerotic nonaneurysmal thoracic aorta. Prominently dilated main pulmonary artery (4.1 cm diameter). No central pulmonary emboli. Mediastinum/Nodes: Hypodense 1.0 cm left thyroid nodule, not definitely seen on prior imaging (series 2/image 9). Unremarkable esophagus. No pathologically enlarged axillary, mediastinal or hilar lymph nodes. Lungs/Pleura: No pneumothorax. No pleural effusion. No acute consolidative airspace disease or lung masses. Medial left lower lobe 0.6 cm solid pulmonary nodule (series 4/image 97), increased from 0.4 cm on 03/22/2022 and 0.3 cm on 01/19/2022 chest CT. No new significant pulmonary nodules. Previously visualized 0.7 cm right lower lobe solid nodule on 01/19/2022 chest CT has resolved. Mild new patchy ground-glass opacity in both lungs, most prominent in the right lower lobe. A few additional tiny solid pulmonary nodules in both lungs measuring up to 0.3 cm in the left lower lobe (series 4/image 62), stable back to 01/19/2022 CT. Musculoskeletal: No aggressive appearing focal osseous lesions. Small lytic posterior T12 vertebral lesion (series 6/image 87), unchanged back to 11/04/2020 PET-CT, where it was non hypermetabolic, considered benign. Healing minimally displaced subacute lateral left fourth and fifth rib fractures. Mild chronic superior T12 vertebral compression fracture. CT ABDOMEN PELVIS FINDINGS Hepatobiliary: Normal liver  with no liver mass. Cholecystectomy. No biliary ductal dilatation. Pancreas: Normal, with no mass or duct dilation. Spleen: Normal size. No mass. Adrenals/Urinary Tract: Normal adrenals. Lobulated heterogeneously enhancing 6.5 x 4.5 cm anterior mid upper right renal cortical mass (series 7/image 22), previously 7.6 x 6.0 cm on 01/19/2022 CT, mildly decreased. No hydronephrosis. No left renal masses. Normal bladder. 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 Irregular avidly enhancing nodular wall thickening in the medial wall of the duodenal bulb measuring up to the 2.4 cm (series 2/image 56), appearing increased from 1.4 cm on 01/09/2022 CT. No dilated small bowel loops. No additional sites of small bowel wall thickening. Normal appendix. No large bowel wall thickening or acute pericolonic fat stranding. Vascular/Lymphatic: Atherosclerotic nonaneurysmal abdominal aorta. Patent portal, splenic, hepatic and renal veins. No definite renal vein tumor thrombus. Right retrocaval/paracaval adenopathy up to 1.4 cm (series 2/image 55), decreased from 2.6 cm on 01/19/2022 CT. No pelvic adenopathy. Reproductive: Partially calcified multilocular complex cystic right adnexal 4.6 x 4.5 cm mass (series 2/image 89), increased from 4.3 x 3.2 cm. No left adnexal masses. Other: No pneumoperitoneum. No ascites. No fluid collections. Several peripherally calcified nodules scattered throughout the peritoneal cavity, for example 2.2 cm in the pelvic cul-de-sac (series 2/image 99), stable from 01/19/2022 CT. Lateral left peritoneal cavity 1.3 cm calcified nodule (series 2/image 75), stable. Fine nodularity in calcification in the right paracolic gutter his similar. Right lower quadrant calcified 1.5 cm nodule (series 2/image 82), stable. Musculoskeletal: No aggressive appearing focal osseous lesions. Moderate lumbar spondylosis. IMPRESSION: 1. Mixed interval changes. 2.  Irregular avidly enhancing nodular wall thickening in the medial wall of the duodenal bulb, appearing increased, cannot exclude metastasis versus primary duodenal neoplasm. 3. Medial left lower lobe 0.6 cm solid pulmonary nodule, increased mildly on consecutive chest CT studies,  suspicious for growing pulmonary metastasis. 4. Hypodense 1.0 cm left thyroid nodule, not definitely seen on prior imaging, cannot exclude small thyroid metastasis. 5. Lobulated heterogeneously enhancing 6.5 cm anterior mid/upper right renal cortical mass, compatible with primary renal cell carcinoma, mildly decreased in size since 01/19/2022 CT. 6. Right retrocaval/paracaval adenopathy is decreased since 01/19/2022 CT. 7. Partially calcified multilocular complex cystic right adnexal 4.6 cm mass, increased in size, cannot exclude a separate cystic right ovarian malignancy. Several calcified nodules scattered in the peritoneal cavity, not substantially changed, suspicious for peritoneal metastases. No ascites. 8. Mild new patchy ground-glass opacity in both lungs, probably inflammatory. 9. Prominently dilated main pulmonary artery, suggesting pulmonary arterial hypertension. 10. Two-vessel coronary atherosclerosis. 11. Healing minimally displaced subacute lateral left fourth and fifth rib fractures. 12.  Aortic Atherosclerosis (ICD10-I70.0). Electronically Signed   By: Ilona Sorrel M.D.   On: 07/20/2022 20:38

## 2022-07-28 NOTE — Assessment & Plan Note (Signed)
Check CA125, HE4 Poor candidate for aggressive treatment anyway.  Will hold off aggressive workup, pending her meeting with palliative care service.

## 2022-07-28 NOTE — Assessment & Plan Note (Signed)
Right RCC  with retroperitoneal lymphadenopathy, lung mass and peritoneal nodularity Progressed on 1st line Keytruda, did not tolerate 2nd line Axitinib '5mg'$  BID- encephalopathy Labs reviewed and discussed with patient.  She did not tolerate cabozantinib, 40 mg daily.  Currently she is off treatments. Repeat CT chest abdomen pelvis results were reviewed and discussed with patient. Patient has mixed response. Given patient's multiple comorbidities, poor performance status, I do not think she is a candidate for any additional aggressive chemotherapy treatment.  Recommend patient to have a follow-up appointment with palliative care service and discussion of goals of care.  Recommend transition to comfort care/hospice

## 2022-07-30 ENCOUNTER — Other Ambulatory Visit (HOSPITAL_COMMUNITY): Payer: Self-pay

## 2022-08-02 ENCOUNTER — Inpatient Hospital Stay (HOSPITAL_BASED_OUTPATIENT_CLINIC_OR_DEPARTMENT_OTHER): Payer: Medicare PPO | Admitting: Hospice and Palliative Medicine

## 2022-08-02 ENCOUNTER — Other Ambulatory Visit (HOSPITAL_COMMUNITY): Payer: Self-pay

## 2022-08-02 DIAGNOSIS — C641 Malignant neoplasm of right kidney, except renal pelvis: Secondary | ICD-10-CM

## 2022-08-02 LAB — MISC LABCORP TEST (SEND OUT): Labcorp test code: 140045

## 2022-08-02 NOTE — Progress Notes (Signed)
Patient unable to physically come to appointment today.  I met instead with her HCPOA, [name].  Reportedly, patient is declining and often confused.  She is now a resident at [facility]. We discussed patient's overall poor prognosis as she is no longer felt to be a treatment candidate for metastatic RCC.  I recommended hospice involvement and HCPOA was in agreement with this. Will send hospice referral.   Per Mr. Capps, goal at this point is to keep patient comfortable until her end-of-life. He is in agreement with DNR/DNI but wants to speak with patient's PCP about placing this on file at ALF.   Follow-up as needed

## 2022-10-13 ENCOUNTER — Encounter: Payer: Self-pay | Admitting: Oncology

## 2022-10-13 ENCOUNTER — Other Ambulatory Visit: Payer: Self-pay

## 2022-10-13 MED ORDER — INSULIN PEN NEEDLE 31G X 5 MM MISC
3 refills | Status: DC
  Filled 2022-10-13: qty 14, 14d supply, fill #0
  Filled 2022-10-25: qty 14, 14d supply, fill #1
  Filled 2022-11-10: qty 14, 14d supply, fill #2
  Filled 2022-11-29: qty 14, 14d supply, fill #3

## 2022-10-13 MED ORDER — INSULIN GLARGINE-YFGN 100 UNIT/ML ~~LOC~~ SOPN
22.0000 [IU] | PEN_INJECTOR | Freq: Every day | SUBCUTANEOUS | 3 refills | Status: DC
  Filled 2022-10-13: qty 3, 13d supply, fill #0
  Filled 2022-10-25: qty 3, 13d supply, fill #1
  Filled 2022-11-10: qty 3, 13d supply, fill #2
  Filled 2022-11-29: qty 3, 13d supply, fill #3

## 2022-10-14 ENCOUNTER — Other Ambulatory Visit: Payer: Self-pay

## 2022-10-25 ENCOUNTER — Other Ambulatory Visit: Payer: Self-pay

## 2022-11-10 ENCOUNTER — Other Ambulatory Visit: Payer: Self-pay

## 2022-11-29 ENCOUNTER — Other Ambulatory Visit: Payer: Self-pay

## 2022-12-21 ENCOUNTER — Other Ambulatory Visit: Payer: Self-pay

## 2022-12-21 MED ORDER — INSULIN GLARGINE-YFGN 100 UNIT/ML ~~LOC~~ SOPN
22.0000 [IU] | PEN_INJECTOR | Freq: Every evening | SUBCUTANEOUS | 6 refills | Status: DC
  Filled 2022-12-21: qty 3, 13d supply, fill #0
  Filled 2023-01-03: qty 3, 13d supply, fill #1
  Filled 2023-01-26: qty 3, 13d supply, fill #2
  Filled 2023-03-11: qty 3, 13d supply, fill #3

## 2023-01-03 ENCOUNTER — Other Ambulatory Visit: Payer: Self-pay

## 2023-01-26 ENCOUNTER — Other Ambulatory Visit: Payer: Self-pay

## 2023-03-11 ENCOUNTER — Encounter: Payer: Self-pay | Admitting: Oncology

## 2023-03-11 ENCOUNTER — Other Ambulatory Visit: Payer: Self-pay

## 2023-03-14 ENCOUNTER — Other Ambulatory Visit: Payer: Self-pay

## 2023-03-29 ENCOUNTER — Other Ambulatory Visit: Payer: Self-pay

## 2023-03-29 MED ORDER — TECHLITE PEN NEEDLES 31G X 5 MM MISC
4 refills | Status: DC
  Filled 2023-03-29: qty 14, 14d supply, fill #0
  Filled 2023-04-08: qty 14, 14d supply, fill #1
  Filled 2023-04-27: qty 14, 14d supply, fill #2
  Filled 2023-07-05: qty 14, 14d supply, fill #3
  Filled 2023-07-19: qty 14, 14d supply, fill #4

## 2023-03-29 MED ORDER — INSULIN GLARGINE-YFGN 100 UNIT/ML ~~LOC~~ SOPN
23.0000 [IU] | PEN_INJECTOR | Freq: Every day | SUBCUTANEOUS | 4 refills | Status: DC
  Filled 2023-03-29: qty 3, 13d supply, fill #0
  Filled 2023-04-06: qty 3, 13d supply, fill #1
  Filled 2023-04-27: qty 3, 13d supply, fill #2
  Filled 2023-05-23: qty 3, 13d supply, fill #3
  Filled 2023-06-13 (×2): qty 3, 13d supply, fill #4

## 2023-04-05 ENCOUNTER — Other Ambulatory Visit: Payer: Self-pay

## 2023-04-06 ENCOUNTER — Other Ambulatory Visit: Payer: Self-pay

## 2023-04-08 ENCOUNTER — Other Ambulatory Visit: Payer: Self-pay

## 2023-04-27 ENCOUNTER — Other Ambulatory Visit: Payer: Self-pay

## 2023-05-03 ENCOUNTER — Other Ambulatory Visit: Payer: Self-pay

## 2023-05-23 ENCOUNTER — Other Ambulatory Visit: Payer: Self-pay

## 2023-06-13 ENCOUNTER — Encounter: Payer: Self-pay | Admitting: Oncology

## 2023-06-13 ENCOUNTER — Other Ambulatory Visit: Payer: Self-pay

## 2023-07-05 ENCOUNTER — Other Ambulatory Visit: Payer: Self-pay

## 2023-07-05 MED ORDER — INSULIN GLARGINE-YFGN 100 UNIT/ML ~~LOC~~ SOPN
23.0000 [IU] | PEN_INJECTOR | Freq: Every day | SUBCUTANEOUS | 3 refills | Status: DC
  Filled 2023-07-05 (×2): qty 3, 13d supply, fill #0
  Filled 2023-07-19: qty 3, 13d supply, fill #1
  Filled 2023-07-28: qty 3, 13d supply, fill #2
  Filled 2023-08-18: qty 3, 13d supply, fill #3

## 2023-07-19 ENCOUNTER — Other Ambulatory Visit: Payer: Self-pay

## 2023-07-28 ENCOUNTER — Other Ambulatory Visit: Payer: Self-pay

## 2023-08-18 ENCOUNTER — Other Ambulatory Visit: Payer: Self-pay

## 2023-09-05 ENCOUNTER — Other Ambulatory Visit: Payer: Self-pay

## 2023-09-06 ENCOUNTER — Other Ambulatory Visit: Payer: Self-pay

## 2023-09-06 MED ORDER — INSULIN GLARGINE-YFGN 100 UNIT/ML ~~LOC~~ SOPN
23.0000 [IU] | PEN_INJECTOR | Freq: Every day | SUBCUTANEOUS | 3 refills | Status: DC
  Filled 2023-09-06: qty 3, 13d supply, fill #0

## 2023-09-08 ENCOUNTER — Other Ambulatory Visit: Payer: Self-pay

## 2023-09-26 ENCOUNTER — Other Ambulatory Visit: Payer: Self-pay

## 2023-09-26 MED ORDER — INSUPEN PEN NEEDLES 31G X 5 MM MISC
0 refills | Status: DC
  Filled 2023-09-26: qty 14, 14d supply, fill #0
  Filled 2023-10-11: qty 14, 14d supply, fill #1

## 2023-09-26 MED ORDER — INSULIN GLARGINE-YFGN 100 UNIT/ML ~~LOC~~ SOPN
23.0000 [IU] | PEN_INJECTOR | Freq: Every day | SUBCUTANEOUS | 0 refills | Status: DC
  Filled 2023-09-26: qty 3, 13d supply, fill #0
  Filled 2023-10-11: qty 3, 13d supply, fill #1

## 2023-09-27 ENCOUNTER — Other Ambulatory Visit: Payer: Self-pay

## 2023-10-11 ENCOUNTER — Other Ambulatory Visit: Payer: Self-pay

## 2023-10-18 ENCOUNTER — Other Ambulatory Visit: Payer: Self-pay

## 2023-10-18 ENCOUNTER — Emergency Department: Payer: Medicare PPO

## 2023-10-18 ENCOUNTER — Emergency Department: Payer: Self-pay

## 2023-10-18 ENCOUNTER — Inpatient Hospital Stay: Payer: Medicare PPO

## 2023-10-18 ENCOUNTER — Inpatient Hospital Stay
Admission: EM | Admit: 2023-10-18 | Discharge: 2023-10-27 | DRG: 308 | Disposition: A | Discharge status: Skilled Nursing Facility | Source: Skilled Nursing Facility | Attending: Internal Medicine | Admitting: Internal Medicine

## 2023-10-18 DIAGNOSIS — I480 Paroxysmal atrial fibrillation: Principal | ICD-10-CM | POA: Diagnosis present

## 2023-10-18 DIAGNOSIS — Z515 Encounter for palliative care: Secondary | ICD-10-CM | POA: Diagnosis not present

## 2023-10-18 DIAGNOSIS — I82C12 Acute embolism and thrombosis of left internal jugular vein: Secondary | ICD-10-CM | POA: Diagnosis present

## 2023-10-18 DIAGNOSIS — J189 Pneumonia, unspecified organism: Secondary | ICD-10-CM | POA: Diagnosis present

## 2023-10-18 DIAGNOSIS — R197 Diarrhea, unspecified: Secondary | ICD-10-CM | POA: Insufficient documentation

## 2023-10-18 DIAGNOSIS — T402X5A Adverse effect of other opioids, initial encounter: Secondary | ICD-10-CM | POA: Diagnosis not present

## 2023-10-18 DIAGNOSIS — T474X5A Adverse effect of other laxatives, initial encounter: Secondary | ICD-10-CM | POA: Diagnosis present

## 2023-10-18 DIAGNOSIS — Z79899 Other long term (current) drug therapy: Secondary | ICD-10-CM

## 2023-10-18 DIAGNOSIS — C7800 Secondary malignant neoplasm of unspecified lung: Secondary | ICD-10-CM | POA: Diagnosis present

## 2023-10-18 DIAGNOSIS — K5909 Other constipation: Secondary | ICD-10-CM | POA: Diagnosis present

## 2023-10-18 DIAGNOSIS — I10 Essential (primary) hypertension: Secondary | ICD-10-CM | POA: Diagnosis present

## 2023-10-18 DIAGNOSIS — Z803 Family history of malignant neoplasm of breast: Secondary | ICD-10-CM

## 2023-10-18 DIAGNOSIS — S0003XA Contusion of scalp, initial encounter: Secondary | ICD-10-CM | POA: Diagnosis present

## 2023-10-18 DIAGNOSIS — Z794 Long term (current) use of insulin: Secondary | ICD-10-CM | POA: Diagnosis not present

## 2023-10-18 DIAGNOSIS — E871 Hypo-osmolality and hyponatremia: Secondary | ICD-10-CM | POA: Diagnosis present

## 2023-10-18 DIAGNOSIS — E663 Overweight: Secondary | ICD-10-CM

## 2023-10-18 DIAGNOSIS — F32A Depression, unspecified: Secondary | ICD-10-CM | POA: Diagnosis present

## 2023-10-18 DIAGNOSIS — F3289 Other specified depressive episodes: Secondary | ICD-10-CM | POA: Diagnosis not present

## 2023-10-18 DIAGNOSIS — S0990XA Unspecified injury of head, initial encounter: Secondary | ICD-10-CM

## 2023-10-18 DIAGNOSIS — C7951 Secondary malignant neoplasm of bone: Secondary | ICD-10-CM | POA: Diagnosis present

## 2023-10-18 DIAGNOSIS — Z7984 Long term (current) use of oral hypoglycemic drugs: Secondary | ICD-10-CM

## 2023-10-18 DIAGNOSIS — S42294A Other nondisplaced fracture of upper end of right humerus, initial encounter for closed fracture: Secondary | ICD-10-CM

## 2023-10-18 DIAGNOSIS — J188 Other pneumonia, unspecified organism: Secondary | ICD-10-CM

## 2023-10-18 DIAGNOSIS — Z7901 Long term (current) use of anticoagulants: Secondary | ICD-10-CM

## 2023-10-18 DIAGNOSIS — Z66 Do not resuscitate: Secondary | ICD-10-CM | POA: Diagnosis present

## 2023-10-18 DIAGNOSIS — I2699 Other pulmonary embolism without acute cor pulmonale: Secondary | ICD-10-CM

## 2023-10-18 DIAGNOSIS — Z9884 Bariatric surgery status: Secondary | ICD-10-CM

## 2023-10-18 DIAGNOSIS — Z6829 Body mass index (BMI) 29.0-29.9, adult: Secondary | ICD-10-CM

## 2023-10-18 DIAGNOSIS — E1165 Type 2 diabetes mellitus with hyperglycemia: Secondary | ICD-10-CM

## 2023-10-18 DIAGNOSIS — Z87442 Personal history of urinary calculi: Secondary | ICD-10-CM

## 2023-10-18 DIAGNOSIS — Z9109 Other allergy status, other than to drugs and biological substances: Secondary | ICD-10-CM

## 2023-10-18 DIAGNOSIS — C641 Malignant neoplasm of right kidney, except renal pelvis: Secondary | ICD-10-CM | POA: Diagnosis present

## 2023-10-18 DIAGNOSIS — S42291S Other displaced fracture of upper end of right humerus, sequela: Secondary | ICD-10-CM | POA: Diagnosis not present

## 2023-10-18 DIAGNOSIS — I4891 Unspecified atrial fibrillation: Secondary | ICD-10-CM | POA: Diagnosis present

## 2023-10-18 DIAGNOSIS — S42201A Unspecified fracture of upper end of right humerus, initial encounter for closed fracture: Secondary | ICD-10-CM | POA: Insufficient documentation

## 2023-10-18 DIAGNOSIS — R5383 Other fatigue: Secondary | ICD-10-CM

## 2023-10-18 DIAGNOSIS — W19XXXA Unspecified fall, initial encounter: Secondary | ICD-10-CM | POA: Diagnosis present

## 2023-10-18 LAB — GASTROINTESTINAL PANEL BY PCR, STOOL (REPLACES STOOL CULTURE)

## 2023-10-18 LAB — APTT: aPTT: 28 s (ref 24–36)

## 2023-10-18 LAB — COMPREHENSIVE METABOLIC PANEL
ALT: 11 U/L (ref 0–44)
AST: 16 U/L (ref 15–41)
Albumin: 3.1 g/dL — ABNORMAL LOW (ref 3.5–5.0)
Alkaline Phosphatase: 79 U/L (ref 38–126)
Anion gap: 13 (ref 5–15)
BUN: 11 mg/dL (ref 8–23)
CO2: 24 mmol/L (ref 22–32)
Calcium: 10 mg/dL (ref 8.9–10.3)
Chloride: 93 mmol/L — ABNORMAL LOW (ref 98–111)
Creatinine, Ser: 0.64 mg/dL (ref 0.44–1.00)
GFR, Estimated: >60 mL/min (ref >60)
Glucose, Bld: 350 mg/dL — ABNORMAL HIGH (ref 70–99)
Potassium: 4.3 mmol/L (ref 3.5–5.1)
Sodium: 130 mmol/L — ABNORMAL LOW (ref 135–145)
Total Bilirubin: 1.2 mg/dL (ref 0.0–1.2)
Total Protein: 7.3 g/dL (ref 6.5–8.1)

## 2023-10-18 LAB — URINALYSIS, W/ REFLEX TO CULTURE (INFECTION SUSPECTED)
Bilirubin Urine: NEGATIVE
Glucose, UA: >=500 mg/dL — AB
Hgb urine dipstick: NEGATIVE
Ketones, ur: 20 mg/dL — AB
Leukocytes,Ua: NEGATIVE
Nitrite: POSITIVE — AB
Protein, ur: NEGATIVE mg/dL
Specific Gravity, Urine: >1.046 — ABNORMAL HIGH (ref 1.005–1.030)
pH: 5 (ref 5.0–8.0)

## 2023-10-18 LAB — TROPONIN I (HIGH SENSITIVITY)
Troponin I (High Sensitivity): 13 ng/L (ref <18)
Troponin I (High Sensitivity): 15 ng/L (ref <18)

## 2023-10-18 LAB — CBG MONITORING, ED
Glucose-Capillary: 340 mg/dL — ABNORMAL HIGH (ref 70–99)
Glucose-Capillary: 212 mg/dL — ABNORMAL HIGH (ref 70–99)

## 2023-10-18 LAB — PROTIME-INR
INR: 1.2 (ref 0.8–1.2)
Prothrombin Time: 15.8 s — ABNORMAL HIGH (ref 11.4–15.2)

## 2023-10-18 LAB — CBC
HCT: 38.8 % (ref 36.0–46.0)
Hemoglobin: 11.7 g/dL — ABNORMAL LOW (ref 12.0–15.0)
MCH: 25.3 pg — ABNORMAL LOW (ref 26.0–34.0)
MCHC: 30.2 g/dL (ref 30.0–36.0)
MCV: 84 fL (ref 80.0–100.0)
Platelets: 375 10*3/uL (ref 150–400)
RBC: 4.62 MIL/uL (ref 3.87–5.11)
RDW: 14.2 % (ref 11.5–15.5)
WBC: 12.6 10*3/uL — ABNORMAL HIGH (ref 4.0–10.5)
nRBC: 0 % (ref 0.0–0.2)

## 2023-10-18 LAB — PHOSPHORUS: Phosphorus: 2.4 mg/dL — ABNORMAL LOW (ref 2.5–4.6)

## 2023-10-18 LAB — HEMOGLOBIN A1C
Hgb A1c MFr Bld: 11.1 % — ABNORMAL HIGH (ref 4.8–5.6)
Mean Plasma Glucose: 271.87 mg/dL

## 2023-10-18 LAB — STREP PNEUMONIAE URINARY ANTIGEN: Strep Pneumo Urinary Antigen: NEGATIVE

## 2023-10-18 LAB — MAGNESIUM: Magnesium: 1.5 mg/dL — ABNORMAL LOW (ref 1.7–2.4)

## 2023-10-18 LAB — LACTIC ACID, PLASMA: Lactic Acid, Venous: 1.6 mmol/L (ref 0.5–1.9)

## 2023-10-18 MED ORDER — GUAIFENESIN ER 600 MG PO TB12
1200.0000 mg | ORAL_TABLET | Freq: Two times a day (BID) | ORAL | Status: DC
  Administered 2023-10-18 – 2023-10-27 (×18): 1200 mg via ORAL
  Filled 2023-10-18 (×19): qty 2

## 2023-10-18 MED ORDER — RISAQUAD PO CAPS
2.0000 | ORAL_CAPSULE | Freq: Three times a day (TID) | ORAL | Status: DC
  Administered 2023-10-18 – 2023-10-27 (×27): 2 via ORAL
  Filled 2023-10-18 (×27): qty 2

## 2023-10-18 MED ORDER — SODIUM CHLORIDE 0.9 % IV SOLN
1.0000 g | INTRAVENOUS | Status: DC
  Administered 2023-10-19 – 2023-10-24 (×6): 1 g via INTRAVENOUS
  Filled 2023-10-18 (×7): qty 10

## 2023-10-18 MED ORDER — DOXYCYCLINE HYCLATE 100 MG PO TABS
100.0000 mg | ORAL_TABLET | Freq: Once | ORAL | Status: AC
  Administered 2023-10-18: 100 mg via ORAL
  Filled 2023-10-18: qty 1

## 2023-10-18 MED ORDER — SODIUM CHLORIDE 0.9 % IV BOLUS
1000.0000 mL | Freq: Once | INTRAVENOUS | Status: AC
  Administered 2023-10-18: 1000 mL via INTRAVENOUS

## 2023-10-18 MED ORDER — IOHEXOL 350 MG/ML SOLN
100.0000 mL | Freq: Once | INTRAVENOUS | Status: AC | PRN
  Administered 2023-10-18: 100 mL via INTRAVENOUS

## 2023-10-18 MED ORDER — INSULIN GLARGINE-YFGN 100 UNIT/ML ~~LOC~~ SOLN
23.0000 [IU] | Freq: Every day | SUBCUTANEOUS | Status: DC
  Administered 2023-10-19 – 2023-10-21 (×3): 23 [IU] via SUBCUTANEOUS
  Filled 2023-10-18 (×4): qty 0.23

## 2023-10-18 MED ORDER — POLYVINYL ALCOHOL 1.4 % OP SOLN: 1.0000 [drp] | Freq: Two times a day (BID) | OPHTHALMIC | Status: DC | PRN

## 2023-10-18 MED ORDER — LACTATED RINGERS IV SOLN: INTRAVENOUS | Status: DC

## 2023-10-18 MED ORDER — RIVAROXABAN 20 MG PO TABS: 20.0000 mg | ORAL_TABLET | Freq: Every day | ORAL | Status: DC

## 2023-10-18 MED ORDER — HYDROMORPHONE HCL 1 MG/ML IJ SOLN
0.5000 mg | INTRAMUSCULAR | Status: DC | PRN
  Administered 2023-10-20: 0.5 mg via INTRAVENOUS
  Filled 2023-10-18: qty 1

## 2023-10-18 MED ORDER — LORATADINE 10 MG PO TABS: 10.0000 mg | ORAL_TABLET | Freq: Every day | ORAL | Status: DC

## 2023-10-18 MED ORDER — FAMOTIDINE 20 MG PO TABS
40.0000 mg | ORAL_TABLET | Freq: Every day | ORAL | Status: DC
  Administered 2023-10-19 – 2023-10-27 (×9): 40 mg via ORAL
  Filled 2023-10-18 (×9): qty 2

## 2023-10-18 MED ORDER — LEVALBUTEROL HCL 0.63 MG/3ML IN NEBU
0.6300 mg | INHALATION_SOLUTION | Freq: Four times a day (QID) | RESPIRATORY_TRACT | Status: DC | PRN
Start: 2023-10-18 — End: 2023-10-23

## 2023-10-18 MED ORDER — INSULIN ASPART 100 UNIT/ML IJ SOLN
0.0000 [IU] | Freq: Three times a day (TID) | INTRAMUSCULAR | Status: DC
  Administered 2023-10-18: 11 [IU] via SUBCUTANEOUS
  Administered 2023-10-19 (×2): 8 [IU] via SUBCUTANEOUS
  Administered 2023-10-19: 5 [IU] via SUBCUTANEOUS
  Administered 2023-10-20: 3 [IU] via SUBCUTANEOUS
  Administered 2023-10-20 (×2): 5 [IU] via SUBCUTANEOUS
  Administered 2023-10-21: 8 [IU] via SUBCUTANEOUS
  Administered 2023-10-21: 5 [IU] via SUBCUTANEOUS
  Administered 2023-10-21: 8 [IU] via SUBCUTANEOUS
  Administered 2023-10-22: 3 [IU] via SUBCUTANEOUS
  Administered 2023-10-22: 2 [IU] via SUBCUTANEOUS
  Administered 2023-10-22 – 2023-10-23 (×2): 3 [IU] via SUBCUTANEOUS
  Administered 2023-10-23: 2 [IU] via SUBCUTANEOUS
  Administered 2023-10-24: 3 [IU] via SUBCUTANEOUS
  Administered 2023-10-24: 2 [IU] via SUBCUTANEOUS
  Administered 2023-10-25: 3 [IU] via SUBCUTANEOUS
  Administered 2023-10-25: 5 [IU] via SUBCUTANEOUS
  Administered 2023-10-26: 3 [IU] via SUBCUTANEOUS
  Administered 2023-10-26: 5 [IU] via SUBCUTANEOUS
  Administered 2023-10-26 – 2023-10-27 (×2): 3 [IU] via SUBCUTANEOUS
  Filled 2023-10-18 (×23): qty 1

## 2023-10-18 MED ORDER — INSULIN ASPART 100 UNIT/ML IJ SOLN
0.0000 [IU] | Freq: Every day | INTRAMUSCULAR | Status: DC
  Administered 2023-10-18: 5 [IU] via SUBCUTANEOUS
  Administered 2023-10-19 – 2023-10-25 (×3): 2 [IU] via SUBCUTANEOUS
  Filled 2023-10-18 (×3): qty 1

## 2023-10-18 MED ORDER — FENTANYL CITRATE PF 50 MCG/ML IJ SOSY
50.0000 ug | PREFILLED_SYRINGE | Freq: Once | INTRAMUSCULAR | Status: AC
  Administered 2023-10-18: 50 ug via INTRAVENOUS
  Filled 2023-10-18: qty 1

## 2023-10-18 MED ORDER — SODIUM CHLORIDE 0.9 % IV SOLN
2.0000 g | Freq: Once | INTRAVENOUS | Status: AC
  Administered 2023-10-18: 2 g via INTRAVENOUS
  Filled 2023-10-18: qty 20

## 2023-10-18 MED ORDER — IPRATROPIUM BROMIDE 0.02 % IN SOLN
0.5000 mg | Freq: Four times a day (QID) | RESPIRATORY_TRACT | Status: DC
  Administered 2023-10-18 – 2023-10-20 (×4): 0.5 mg via RESPIRATORY_TRACT
  Filled 2023-10-18 (×5): qty 2.5

## 2023-10-18 MED ORDER — DILTIAZEM HCL-DEXTROSE 125-5 MG/125ML-% IV SOLN (PREMIX)
5.0000 mg/h | INTRAVENOUS | Status: DC
Start: 2023-10-18 — End: 2023-10-19
  Administered 2023-10-18 – 2023-10-19 (×2): 5 mg/h via INTRAVENOUS
  Filled 2023-10-18 (×2): qty 125

## 2023-10-18 MED ORDER — DILTIAZEM LOAD VIA INFUSION
10.0000 mg | Freq: Once | INTRAVENOUS | Status: AC
  Administered 2023-10-18: 10 mg via INTRAVENOUS
  Filled 2023-10-18: qty 10

## 2023-10-18 MED ORDER — MICONAZOLE NITRATE 2 % EX CREA
1.0000 | TOPICAL_CREAM | Freq: Two times a day (BID) | CUTANEOUS | Status: DC
  Administered 2023-10-20 – 2023-10-26 (×14): 1 via TOPICAL
  Filled 2023-10-18 (×4): qty 14

## 2023-10-18 MED ORDER — DOXYCYCLINE HYCLATE 100 MG PO TABS
100.0000 mg | ORAL_TABLET | Freq: Two times a day (BID) | ORAL | Status: DC
  Administered 2023-10-18 – 2023-10-25 (×14): 100 mg via ORAL
  Filled 2023-10-18 (×14): qty 1

## 2023-10-18 MED ORDER — RIVAROXABAN 15 MG PO TABS
15.0000 mg | ORAL_TABLET | Freq: Two times a day (BID) | ORAL | Status: DC
  Administered 2023-10-18 – 2023-10-27 (×18): 15 mg via ORAL
  Filled 2023-10-18 (×19): qty 1

## 2023-10-18 MED ORDER — ENOXAPARIN SODIUM 80 MG/0.8ML IJ SOSY: 1.0000 mg/kg | PREFILLED_SYRINGE | Freq: Two times a day (BID) | INTRAMUSCULAR | Status: DC

## 2023-10-18 MED ORDER — MAGNESIUM SULFATE IN D5W 1-5 GM/100ML-% IV SOLN
1.0000 g | Freq: Once | INTRAVENOUS | Status: AC
  Administered 2023-10-18: 1 g via INTRAVENOUS
  Filled 2023-10-18: qty 100

## 2023-10-18 MED ORDER — ESCITALOPRAM OXALATE 10 MG PO TABS
10.0000 mg | ORAL_TABLET | Freq: Every day | ORAL | Status: DC
  Administered 2023-10-19 – 2023-10-27 (×9): 10 mg via ORAL
  Filled 2023-10-18 (×9): qty 1

## 2023-10-18 MED ORDER — ACETAMINOPHEN 325 MG PO TABS
650.0000 mg | ORAL_TABLET | Freq: Three times a day (TID) | ORAL | Status: DC
  Administered 2023-10-18 – 2023-10-27 (×27): 650 mg via ORAL
  Filled 2023-10-18 (×27): qty 2

## 2023-10-18 NOTE — Progress Notes (Signed)
                                                     Palliative Care Progress Note   Patient Name: Barbara Frank       Date: 10/18/2023 DOB: Feb 01, 1946  Age: 78 y.o. MRN#: [id number] Attending Physician: Corena Herter, MD Primary Care Physician: Melonie Florida, FNP Admit Date: 10/18/2023  Consult received and chart reviewed.  As per chart review, patient is active with [facility] hospice services.   I counseled with [facility] liaison Ree Kida and attending Dr. Arnoldo Morale. Ree Kida is aware of patient's admission and he confirmed that [facility] plans to support patient with goals of care discussions and symptom management during this hospitalization.    Attending Dr. Arnoldo Morale made aware that PMT will sign off but please re-engage if acute palliative needs beyond [facility]'s scope is needed.   Thank you for allowing the Palliative Medicine Team to assist in the care of [facility].  Samara Deist L. Bonita Quin, DNP, FNP-BC Palliative Medicine Team  No charge

## 2023-10-18 NOTE — ED Notes (Addendum)
Pt had a fall this morning/afternoon which she does not remember.  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  Sts "she has stuff everywhere so I'm not surprised she fell."  Pt has pain in R shoulder and elbow.  Bruising noted on R shoulder, forearm, and hand.  Additionally, Pt has a large hematoma on R forehead.   Pt noted to have a congested cough which family reports has been going on for 3-4 days.  Pt is from [employer].

## 2023-10-18 NOTE — ED Notes (Signed)
RN Lenis Dickinson messaged regarding pt coming to room and updated on complaint.

## 2023-10-18 NOTE — ED Notes (Signed)
Delay in antibiotic administration d/t Pt only having one IV line and Cardizem is running.  EDP aware.  Pt is a hard stick and R arm is restricted d/t break in shoulder.    Will attempt another line shortly and contact IV team.

## 2023-10-18 NOTE — ED Notes (Signed)
Ct called and informed to bring pt to ED 10 after scan. Per Megan CT pt going to xray next and they will tell  them.

## 2023-10-18 NOTE — ED Notes (Signed)
First nurse note: From [facility] for unwitnessed fall R shoulder looks pushed down per EMS, may be dislocated. Also hematoma to R forehead, skin tears to R hand, bruise to R knuckles  EMS VS: HR 148, 95% RA, 182/105. Has DNR with her

## 2023-10-18 NOTE — Progress Notes (Addendum)
[facility] Liaison note:   This patient is a current [facility] patient.  [facility]  will continue to follow for discharge disposition.    Please call for any hospice related questions or concerns.   [facility] Liaison [phone]

## 2023-10-18 NOTE — Inpatient Diabetes Management (Signed)
Inpatient Diabetes Program Recommendations  AACE/ADA: New Consensus Statement on Inpatient Glycemic Control  Target Ranges:  Prepandial:   less than 140 mg/dL      Peak postprandial:   less than 180 mg/dL (1-2 hours)      Critically ill patients:  140 - 180 mg/dL    Latest Reference Range & Units 10/18/23 09:31  Glucose 70 - 99 mg/dL 250 (H)   Review of Glycemic Control  Diabetes history: DM2 Outpatient Diabetes medications: Semglee 23 units daily, Metformin XR 1000 mg daily Current orders for Inpatient glycemic control: None; in ED  Inpatient Diabetes Program Recommendations:    Insulin: Please consider ordering CBGs Q4H and Novolog 0-9 units Q4H. IF CBGs are consistently over 180 mg/dl with Novolog correction, consider ordering Semglee 7 units Q24H (based on 72.6 kg x 0.1 units).  Thanks, Orlando Penner, RN, MSN, CDCES Diabetes Coordinator Inpatient Diabetes Program [phone] (Team Pager from 8am to 5pm)

## 2023-10-18 NOTE — ED Notes (Signed)
Pt is refusing DVT study.  [place] tech will attempt again later.

## 2023-10-18 NOTE — ED Notes (Signed)
Sandwich tray provided

## 2023-10-18 NOTE — Sepsis Progress Note (Signed)
[facility] will follow per sepsis protocol.

## 2023-10-18 NOTE — Progress Notes (Signed)
PHARMACY - ANTICOAGULATION CONSULT NOTE  Pharmacy Consult for Xarelto Indication: pulmonary embolus  Allergies  Allergen Reactions   Kiwi Extract    Patient Measurements: Height: 5\' 2"  (157.5 cm) Weight: 72.6 kg (160 lb 0.9 oz) IBW/kg (Calculated) : 50.1  Vital Signs: Temp: 98.3 F (36.8 C) (02/11 1619) Temp Source: Oral (02/11 1619) BP: 159/100 (02/11 1630) Pulse Rate: 121 (02/11 1630)  Labs: Recent Labs    10/18/23 0931 10/18/23 0936 10/18/23 1100 10/18/23 1257  HGB  --  11.7*  --   --   HCT  --  38.8  --   --   PLT  --  375  --   --   APTT  --   --  28  --   LABPROT  --   --  15.8*  --   INR  --   --  1.2  --   CREATININE 0.64  --   --   --   TROPONINIHS 13  --   --  15    Estimated Creatinine Clearance: 55.8 mL/min (by C-G formula based on SCr of 0.64 mg/dL).   Medical History: Past Medical History:  Diagnosis Date   A-fib (HCC)    Depression    Diabetes mellitus without complication (HCC)    History of kidney stones    Kidney mass    Assessment: 78 y/o female presenting with new onset shortness of breath, weakness, and fall at home. 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 CTA imaging of chest shows acute, right-sided pulmonary embolism. Pharmacy has been consulted to transition from apixaban to rivaroxaban per hematology.  Baseline labs: Hgb 11.7, plt 375, aPTT 28 sec Last dose apixaban: 5 mg on 10/18/23 @ 0737  Goal of Therapy:  Monitor platelets by anticoagulation protocol: Yes   Plan:  Last dose of apixaban taken this AM, will start rivaroxaban this evening when next dose of apixaban due Start rivaroxaban 15 mg twice daily with meals for 21 days then transition to rivaroxaban 20 mg daily with food thereafter Monitor CBC and signs/symptoms of bleeding  Thank you for involving pharmacy in this patient's care.    Rockwell Alexandria, PharmD Clinical Pharmacist 10/18/2023 4:41 PM

## 2023-10-18 NOTE — ED Provider Notes (Signed)
[facility] Provider Note    Event Date/Time   First MD Initiated Contact with Patient 10/18/23 0935     (approximate)   History   Fall   HPI  Barbara Frank is a 78 y.o. female past medical history [place] for metastatic cancer, atrial fibrillation on Eliquis, presents to the emergency department following a fall.  Patient was uncertain of what caused her to have a fall.  Large bruising to her right side and complaining of right shoulder pain.  Patient is currently on hospice for metastatic cancer, called her hospice team and told to come to the emergency department.  Endorses compliance with her home medications.  Healthcare power of attorney is at bedside.     Physical Exam   Triage Vital Signs: ED Triage Vitals [10/18/23 0918]  Encounter Vitals Group     BP 138/80     Systolic BP Percentile      Diastolic BP Percentile      Pulse Rate (!) 153     Resp 19     Temp 97.9 F (36.6 C)     Temp Source Oral     SpO2 96 %     Weight 160 lb 0.9 oz (72.6 kg)     Height 5\' 2"  (1.575 m)     Head Circumference      Peak Flow      Pain Score 0     Pain Loc      Pain Education      Exclude from Growth Chart     Most recent vital signs: Vitals:   10/18/23 1500 10/18/23 1515  BP: (!) 133/109   Pulse: (!) 124 (!) 117  Resp: 17 (!) 23  Temp:    SpO2: 97% 94%    Physical Exam Constitutional:      Appearance: She is well-developed. She is ill-appearing.  HENT:     Head:     Comments: Hematoma to the forehead Eyes:     Conjunctiva/sclera: Conjunctivae normal.     Pupils: Pupils are equal, round, and reactive to light.  Cardiovascular:     Rate and Rhythm: Tachycardia present. Rhythm irregular.  Pulmonary:     Effort: No respiratory distress.     Breath sounds: No wheezing.  Abdominal:     General: There is distension.     Tenderness: There is abdominal tenderness.  Musculoskeletal:        General: Normal range of motion.     Cervical  back: Normal range of motion.     Right lower leg: No edema.     Left lower leg: No edema.  Skin:    General: Skin is warm.     Capillary Refill: Capillary refill takes less than 2 seconds.     Comments: Tenderness to the right shoulder.  Multiple skin tears to the right and left upper extremity.  Ecchymosis to the dorsal surface of the right hand.  +2 radial pulses.  Neurological:     Mental Status: She is alert. Mental status is at baseline. She is disoriented.  Psychiatric:        Mood and Affect: Mood normal.     IMPRESSION / MDM / ASSESSMENT AND PLAN / ED COURSE  I reviewed the triage vital signs and the nursing notes.  Differential diagnosis including sepsis, atrial fibrillation with rapid rate, intracranial hemorrhage, progression of cancer, pulmonary embolism  EKG  I, Corena Herter, the attending physician, personally viewed and interpreted this ECG.  Atrial fibrillation with a rapid rate with a rate of 147.  Prolonged QTc at 519.  Underlying right bundle branch block.  Nonspecific ST changes.  Atrial fibrillation with a rapid rate while on cardiac telemetry.  RADIOLOGY I independently reviewed imaging, my interpretation of imaging: CT head with no signs of intracranial hemorrhage.   Discussed CTA and CT abdomen and pelvis with radiologist on-call Multiple findings including pulmonary embolism, concern for IJ thrombus, concern for metastatic cancer that has progressed and possible underlying pneumonia  LABS (all labs ordered are listed, but only abnormal results are displayed) Labs interpreted as -    Labs Reviewed  CBC - Abnormal; Notable for the following components:      Result Value   WBC 12.6 (*)    Hemoglobin 11.7 (*)    MCH 25.3 (*)    All other components within normal limits  COMPREHENSIVE METABOLIC PANEL - Abnormal; Notable for the following components:   Sodium 130 (*)    Chloride 93 (*)    Glucose, Bld 350 (*)    Albumin 3.1 (*)    All other  components within normal limits  MAGNESIUM - Abnormal; Notable for the following components:   Magnesium 1.5 (*)    All other components within normal limits  URINALYSIS, W/ REFLEX TO CULTURE (INFECTION SUSPECTED) - Abnormal; Notable for the following components:   Color, Urine YELLOW (*)    APPearance CLEAR (*)    Specific Gravity, Urine >1.046 (*)    Glucose, UA >=500 (*)    Ketones, ur 20 (*)    Nitrite POSITIVE (*)    Bacteria, UA RARE (*)    All other components within normal limits  PROTIME-INR - Abnormal; Notable for the following components:   Prothrombin Time 15.8 (*)    All other components within normal limits  CULTURE, BLOOD (SINGLE)  LACTIC ACID, PLASMA  APTT  CBG MONITORING, ED  TROPONIN I (HIGH SENSITIVITY)  TROPONIN I (HIGH SENSITIVITY)     MDM  On arrival patient tachycardic and normotensive.  Concern for possible atrial fibrillation with a rapid rate versus sepsis versus pulmonary embolism.  Blood cultures obtained.  Given IV fluids with no significant improvement of her tachycardia.  Patient is a hospice patient.  CTA and CT chest abdomen pelvis obtained.  Noted concern for IJ thrombus.  Also noted concern for pulmonary embolism despite being on Eliquis.  Questionable progression of metastatic disease versus bilateral pneumonia.  Right proximal humerus fracture.  Sling was placed in the emergency department and is otherwise neurovascularly intact.  Started on heparin for pulmonary embolism.  Covered for community-acquired pneumonia with IV Rocephin and doxycycline given her prolonged QTc.  Consulted hospice.  Consulted hospitalist for admission.     PROCEDURES:  Critical Care performed: yes  .Critical Care  Performed by: Corena Herter, MD Authorized by: Corena Herter, MD   Critical care provider statement:    Critical care time (minutes):  60   Critical care time was exclusive of:  Separately billable procedures and treating other patients    Critical care was necessary to treat or prevent imminent or life-threatening deterioration of the following conditions:  Cardiac failure   Critical care was time spent personally by me on the following activities:  Development of treatment plan with patient or surrogate, discussions with consultants, evaluation of patient's response to treatment, examination of patient, ordering and review of laboratory studies, ordering and review of radiographic studies, ordering and performing treatments and interventions, pulse oximetry, re-evaluation  of patient's condition and review of old charts   Care discussed with: admitting provider     Patient's presentation is most consistent with acute presentation with potential threat to life or bodily function.   MEDICATIONS ORDERED IN ED: Medications  diltiazem (CARDIZEM) 1 mg/mL load via infusion 10 mg (10 mg Intravenous Bolus from Bag 10/18/23 1447)    And  diltiazem (CARDIZEM) 125 mg in dextrose 5% 125 mL (1 mg/mL) infusion (5 mg/hr Intravenous New Bag/Given 10/18/23 1447)  lactated ringers infusion (has no administration in time range)  cefTRIAXone (ROCEPHIN) 2 g in sodium chloride 0.9 % 100 mL IVPB (has no administration in time range)  doxycycline (VIBRA-TABS) tablet 100 mg (has no administration in time range)  sodium chloride 0.9 % bolus 1,000 mL (0 mLs Intravenous Stopped 10/18/23 1231)  fentaNYL (SUBLIMAZE) injection 50 mcg (50 mcg Intravenous Given 10/18/23 1102)  magnesium sulfate IVPB 1 g 100 mL (0 g Intravenous Stopped 10/18/23 1247)  iohexol (OMNIPAQUE) 350 MG/ML injection 100 mL (100 mLs Intravenous Contrast Given 10/18/23 1151)    FINAL CLINICAL IMPRESSION(S) / ED DIAGNOSES   Final diagnoses:  Fall, initial encounter  Injury of head, initial encounter  Other closed nondisplaced fracture of proximal end of right humerus, initial encounter  Atrial fibrillation, rapid (HCC)     Rx / DC Orders   ED Discharge Orders     None         Note:  This document was prepared using Dragon voice recognition software and may include unintentional dictation errors.   Corena Herter, MD 10/18/23 1536

## 2023-10-18 NOTE — Progress Notes (Signed)
CODE SEPSIS - PHARMACY COMMUNICATION  **Broad Spectrum Antibiotics should be administered within 1 hour of Sepsis diagnosis**  Time Code Sepsis Called/Page Received: 1512  Antibiotics Ordered: ceftriaxone  Time of 1st antibiotic administration: 1624  Additional action taken by pharmacy: Called ED  If necessary, Name of Provider/Nurse Contacted: N/A    Rockwell Alexandria ,PharmD Clinical Pharmacist  10/18/2023  3:15 PM

## 2023-10-18 NOTE — H&P (Signed)
History and Physical    Barbara Frank ZOX:[id number] DOB: 09-07-1945 DOA: 10/18/2023  PCP: Melonie Florida, FNP (Confirm with patient/family/NH records and if not entered, this has to be entered at [facility] point of entry) Patient coming from: Assisted living  I have personally briefly reviewed patient's old medical records in [facility]  Chief Complaint: Cough, feeling weak, fall  HPI: Barbara Frank is a 78 y.o. 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  Symptoms started 1 week ago, patient started develop dry cough, " feeling something stuck in the chest but cannot cough up".  Yesterday, patient was started on p.o. antibiotics and steroid by assisted-living physician.  Also patient reported that she has chronic constipation and has been taking around-the-clock laxative and stool softener but last week she has been having daily diarrhea, loose denied any nauseous vomiting no abdominal pain.  Gradually she is been feeling so weak and today she sustained a fall at the facility and hit her head.  Denied any LOC she did complain about severe right shoulder pain after the fall. ED Course: Afebrile, patient was found to be in rapid A-fib heart rate ranging from 120-150s, blood pressure 138/80 on arrival.  Blood work showed WBC 12.6, hemoglobin 11.7, glucose 350, K4.3, magnesium 1.5.  CT trauma scan showed right scalp hematoma but no other acute intracranial findings.  CT cervical spine and suspicious for left-sided IJ thrombosis and segmental PE right lower lobe.  Right proximal humerus fracture.  And was started on ceftriaxone and doxycycline in the ED.  Review of Systems: As per HPI otherwise 14 point review of systems negative.    Past Medical History:   Diagnosis Date   A-fib (HCC)    Depression    Diabetes mellitus without complication (HCC)    History of kidney stones    Kidney mass     Past Surgical History:  Procedure Laterality Date   GASTRIC BYPASS     HERNIA REPAIR     TONSILLECTOMY     URETEROSCOPY WITH HOLMIUM LASER LITHOTRIPSY Left      reports that she has never smoked. She has never used smokeless tobacco. She reports that she does not currently use alcohol. She reports that she does not currently use drugs.  Allergies  Allergen Reactions   Kiwi Extract     Family History  Problem Relation Age of Onset   Breast cancer Mother    Bladder Cancer Neg Hx    Kidney cancer Neg Hx    Prostate cancer Neg Hx      Prior to Admission medications   Medication Sig Start Date End Date Taking? Authorizing Provider  acetaminophen (TYLENOL) 325 MG tablet Take 650 mg by mouth 3 (three) times daily.   Yes [provider]  apixaban (ELIQUIS) 5 MG TABS tablet Take 1 tablet (5 mg total) by mouth 2 (two) times daily. 09/11/21 10/18/23 Yes Gillis Santa, MD  bisacodyl (DULCOLAX) 10 MG suppository Place 10 mg rectally daily as needed for moderate constipation. Insert 1 supp per rectum once daily as needed for constipation   Yes [provider]  carvedilol (COREG) 6.25 MG tablet Take 6.25 mg by mouth 2 (two) times daily with a meal.   Yes [provider]  cetirizine (ZYRTEC) 5 MG tablet Take 5 mg by  mouth daily.   Yes [provider]  cyanocobalamin 1000 MCG tablet Take 1,000 mcg by mouth daily.   Yes [provider]  diltiazem (CARDIZEM CD) 240 MG 24 hr capsule Take 1 capsule (240 mg total) by mouth every evening. 09/11/21 10/18/23 Yes Gillis Santa, MD  escitalopram (LEXAPRO) 10 MG tablet Take 10 mg by mouth daily. 03/09/22  Yes [provider]  famotidine (PEPCID) 40 MG tablet Take 40 mg by mouth daily.   Yes [provider]  guaifenesin (ROBITUSSIN) 100 MG/5ML syrup Take 200 mg  by mouth 4 (four) times daily as needed for cough.   Yes [provider]  insulin glargine-yfgn (SEMGLEE, YFGN,) 100 UNIT/ML Pen Inject 23 Units into the skin daily. 09/26/23  Yes   magnesium hydroxide (MILK OF MAGNESIA) 400 MG/5ML suspension Take 30 mLs by mouth daily as needed for mild constipation.   Yes [provider]  melatonin 3 MG TABS tablet Take 3 mg by mouth at bedtime.   Yes [provider]  metFORMIN (GLUCOPHAGE-XR) 500 MG 24 hr tablet Take 1 tablet (500 mg total) by mouth 2 (two) times daily. Patient taking differently: Take 1,000 mg by mouth daily. 09/11/21 10/18/23 Yes Gillis Santa, MD  miconazole (MICOTIN) 2 % powder Apply 1 Application topically in the morning and at bedtime. Apply topically to affected areas of redness under abdominal folds and breast twice daily until healed.   Yes [provider]  ondansetron (ZOFRAN) 8 MG tablet Take 1 tablet (8 mg total) by mouth every 8 (eight) hours as needed for nausea or vomiting. 07/14/22  Yes Rickard Patience, MD  Polyethyl Glycol-Propyl Glycol (SYSTANE) 0.4-0.3 % SOLN Apply 1 drop to eye 2 (two) times daily as needed.   Yes [provider]  polyethylene glycol (MIRALAX / GLYCOLAX) 17 g packet Take 17 g by mouth daily as needed.   Yes [provider]  Psyllium 400 MG CAPS Take 400 mg by mouth daily.   Yes [provider]  senna (SENOKOT) 8.6 MG TABS tablet Take 1 tablet by mouth every Monday, Wednesday, and Friday. Oral at bedtime, every week on Monday, Wednesday and Friday   Yes [provider]  simethicone (MYLICON) 80 MG chewable tablet Chew 80 mg by mouth every 8 (eight) hours as needed for flatulence.   Yes [provider]  cetirizine (ZYRTEC) 10 MG tablet Take 0.5 tablets (5 mg total) by mouth daily. Patient not taking: Reported on 10/18/2023 09/11/21 07/28/22  Gillis Santa, MD  guaifenesin (ROBITUSSIN) 100 MG/5ML syrup Take 200 mg by mouth in the morning, at noon, in  the evening, and at bedtime.    [provider]  insulin glargine (LANTUS) 100 UNIT/ML injection Inject 0.25 mLs (25 Units total) into the skin 2 (two) times daily. Patient not taking: Reported on 10/18/2023 03/26/22   Kathrynn Running, MD  insulin glargine-yfgn [facility]) 100 UNIT/ML Pen Inject 23 Units into the skin daily. Patient not taking: Reported on 10/18/2023 09/06/23     Insulin Pen Needle (INSUPEN PEN NEEDLES) 31G X 5 MM MISC use with semglee 09/26/23     Insulin Pen Needle 31G X 5 MM MISC use with semglee 10/13/22     metoprolol succinate (TOPROL-XL) 25 MG 24 hr tablet Take 25 mg by mouth daily. Patient not taking: Reported on 10/18/2023 10/05/21   [provider]  nystatin (MYCOSTATIN/NYSTOP) powder Apply 1 Application topically 2 (two) times daily. Apply to abdominal fold and groin in the mornings  Applyunder both breasts at night Patient not taking: Reported on 10/18/2023    [provider]    Physical Exam: Vitals:   10/18/23 1450 10/18/23 1500 10/18/23 1515 10/18/23 1530  BP: 118/85 (!) 133/109  129/61  Pulse: (!) 149 (!) 124 (!) 117 (!) 124  Resp: (!) 26 17 (!) 23 (!) 21  Temp:      TempSrc:      SpO2: 95% 97% 94% 94%  Weight:      Height:        Constitutional: NAD, calm, comfortable Vitals:   10/18/23 1450 10/18/23 1500 10/18/23 1515 10/18/23 1530  BP: 118/85 (!) 133/109  129/61  Pulse: (!) 149 (!) 124 (!) 117 (!) 124  Resp: (!) 26 17 (!) 23 (!) 21  Temp:      TempSrc:      SpO2: 95% 97% 94% 94%  Weight:      Height:       Eyes: PERRL, lids and conjunctivae normal ENMT: Mucous membranes are moist. Posterior pharynx clear of any exudate or lesions.Normal dentition.  Neck: normal, supple, no masses, no thyromegaly Respiratory: clear to auscultation bilaterally, no wheezing, scattered crackles on bilateral lower fields, increasing respiratory effort. No accessory muscle use.  Cardiovascular: Irregular heart rate, no murmurs / rubs /  gallops. No extremity edema. 2+ pedal pulses. No carotid bruits.  Abdomen: no tenderness, no masses palpated. No hepatosplenomegaly. Bowel sounds positive.  Musculoskeletal: no clubbing / cyanosis. No joint deformity upper and lower extremities. Good ROM, no contractures. Normal muscle tone.  Skin: no rashes, lesions, ulcers. No induration Neurologic: CN 2-12 grossly intact. Sensation intact, DTR normal. Strength 5/5 in all 4.  Psychiatric: Normal judgment and insight. Alert and oriented x 3. Normal mood.     Labs on Admission: I have personally reviewed following labs and imaging studies  CBC: Recent Labs  Lab 10/18/23 0936  WBC 12.6*  HGB 11.7*  HCT 38.8  MCV 84.0  PLT 375   Basic Metabolic Panel: Recent Labs  Lab 10/18/23 0931  NA 130*  K 4.3  CL 93*  CO2 24  GLUCOSE 350*  BUN 11  CREATININE 0.64  CALCIUM 10.0  MG 1.5*   GFR: Estimated Creatinine Clearance: 55.8 mL/min (by C-G formula based on SCr of 0.64 mg/dL). Liver Function Tests: Recent Labs  Lab 10/18/23 0931  AST 16  ALT 11  ALKPHOS 79  BILITOT 1.2  PROT 7.3  ALBUMIN 3.1*   No results for input(s): "LIPASE", "AMYLASE" in the last 168 hours. No results for input(s): "AMMONIA" in the last 168 hours. Coagulation Profile: Recent Labs  Lab 10/18/23 1100  INR 1.2   Cardiac Enzymes: No results for input(s): "CKTOTAL", "CKMB", "CKMBINDEX", "TROPONINI" in the last 168 hours. BNP (last 3 results) No results for input(s): "PROBNP" in the last 8760 hours. HbA1C: No results for input(s): "HGBA1C" in the last 72 hours. CBG: No results for input(s): "GLUCAP" in the last 168 hours. Lipid Profile: No results for input(s): "CHOL", "HDL", "LDLCALC", "TRIG", "CHOLHDL", "LDLDIRECT" in the last 72 hours. Thyroid Function Tests: No results for input(s): "TSH", "T4TOTAL", "FREET4", "T3FREE", "THYROIDAB" in the last 72 hours. Anemia Panel: No results for input(s): "VITAMINB12", "FOLATE", "FERRITIN", "TIBC",  "IRON", "RETICCTPCT" in the last 72 hours. Urine analysis:    Component Value Date/Time   COLORURINE YELLOW (A) 10/18/2023 1336   APPEARANCEUR CLEAR (A) 10/18/2023 1336   LABSPEC >1.046 (H) 10/18/2023 1336   PHURINE 5.0 10/18/2023 1336   GLUCOSEU >=500 (  A) 10/18/2023 1336   HGBUR NEGATIVE 10/18/2023 1336   BILIRUBINUR NEGATIVE 10/18/2023 1336   KETONESUR 20 (A) 10/18/2023 1336   PROTEINUR NEGATIVE 10/18/2023 1336   NITRITE POSITIVE (A) 10/18/2023 1336   LEUKOCYTESUR NEGATIVE 10/18/2023 1336    Radiological Exams on Admission: CT Angio Chest Pulmonary Embolism (PE) W or WO Contrast Result Date: 10/18/2023 CLINICAL DATA:  Pulmonary embolism (PE) suspected, high prob; Abdominal trauma, blunt. Unwitnessed fall. History of metastatic right renal cell carcinoma. * Tracking Code: BO * EXAM: CT ANGIOGRAPHY CHEST CT ABDOMEN AND PELVIS WITH CONTRAST TECHNIQUE: Multidetector CT imaging of the chest was performed using the standard protocol during bolus administration of intravenous contrast. Multiplanar CT image reconstructions and MIPs were obtained to evaluate the vascular anatomy. Multidetector CT imaging of the abdomen and pelvis was performed using the standard protocol during bolus administration of intravenous contrast. RADIATION DOSE REDUCTION: This exam was performed according to the departmental dose-optimization program which includes automated exposure control, adjustment of the mA and/or kV according to patient size and/or use of iterative reconstruction technique. CONTRAST:  OMNIPAQUE IOHEXOL 350 MG/ML SOLN COMPARISON:  CT scan chest, abdomen and pelvis from 03/22/2022. FINDINGS: CTA CHEST FINDINGS Cardiovascular: There is small volume occlusive and nonocclusive embolism in the segmental pulmonary artery branch in the right lung lower lobe with extension into the subsegmental branch. No right heart strain. No lung infarction. Mild cardiomegaly. No pericardial effusion. No aortic aneurysm.  There are coronary artery calcifications, in keeping with coronary artery disease. There are also mild peripheral atherosclerotic vascular calcifications of thoracic aorta and its major branches. There is dilation of the main pulmonary trunk measuring up to 3.4 cm, which is nonspecific but can be seen with pulmonary artery hypertension. Mediastinum/Nodes: Visualized thyroid gland appears grossly unremarkable. No solid / cystic mediastinal masses. The esophagus is nondistended precluding optimal assessment. There are multiple enlarged mediastinal and bilateral hilar lymph nodes with largest in the right lower paratracheal region measuring up to 1.6 x 3.2 cm. These are indeterminate in etiology but new since the prior study from 07/20/2022. No axillary lymphadenopathy by size criteria. Lungs/Pleura: The central tracheo-bronchial tree is patent. There are heterogeneous opacities bilaterally, predominantly in the perihilar and peribronchovascular region, asymmetrically more in the right upper lobe and left lower lobe. There is also small right pleural effusion. No left pleural effusion. No pneumothorax on either side. Musculoskeletal: The visualized soft tissues of the chest wall are grossly unremarkable. There is expansile lytic lesion in the posterior right third rib with associated bone destruction and soft tissue component, compatible with metastases. No acute vertebral compression fracture or acute displaced rib fracture multiple left old healed rib fractures noted. There are mild multilevel degenerative changes in the visualized spine. Review of the MIP images confirms the above findings. CT ABDOMEN and PELVIS FINDINGS Hepatobiliary: The liver is normal in size. Non-cirrhotic configuration. No suspicious mass. No intrahepatic or extrahepatic bile duct dilation. Gallbladder is surgically absent. Pancreas: Unremarkable. No pancreatic ductal dilatation or surrounding inflammatory changes. Spleen: Within normal  limits. No focal lesion. Adrenals/Urinary Tract: Adrenal glands are unremarkable. There is a large heterogeneous mass in the right kidney currently measuring 7.4 x 9.8 x 10.1 cm, significantly increased since the prior study and compatible with patient's known history of right renal cell carcinoma. There are multiple right perirenal/retrocaval lymph nodes with largest measuring up to 2.3 x 2.5 cm, compatible with metastases. Unremarkable left kidney. No left nephroureterolithiasis or obstructive uropathy. Unremarkable urinary bladder. Stomach/Bowel: Postsurgical changes from  prior gastric bypass noted. Redemonstration of nonspecific hyperattenuating soft tissue along the left wall of the duodenal bulb, which may represent metastatic versus primary neoplastic process. No disproportionate dilation of the small or large bowel loops. No evidence of abnormal bowel wall thickening or inflammatory changes. The appendix is unremarkable. Vascular/Lymphatic: No ascites or pneumoperitoneum. Redemonstration of scattered calcifications throughout the abdomen. Interval enlargement of the pre caval lymph node (series 16, image 38), compatible with metastases. No aneurysmal dilation of the major abdominal arteries. There are mild peripheral atherosclerotic vascular calcifications of the aorta and its major branches. Reproductive: Normal-size anteverted uterus. 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 Redemonstration of a complex mass inseparable from the right side of the fundus of the uterus, grossly similar to the prior study. Other: The visualized soft tissues and abdominal wall are unremarkable. Musculoskeletal: No suspicious osseous lesions. There are mild - moderate multilevel degenerative changes in the visualized spine. Review of the MIP images confirms the above findings. IMPRESSION: 1. Small volume occlusive and nonocclusive  embolism in the segmental pulmonary artery branch in the right lung lower lobe, as described above. No right heart strain or lung infarction. 2. Multiple enlarged mediastinal and bilateral hilar lymph nodes, indeterminate. 3. Heterogeneous opacities mainly in the perihilar/peribronchovascular region. Differential diagnosis includes multi lobar pneumonia versus metastatic deposits. Follow-up to clearing is recommended. 4. New expansile lytic lesion of the posterior right third rib with associated bone destruction and soft tissue, compatible with metastases. 5. Interval increase in the large heterogeneous right renal mass, compatible with patient's known history of renal cell carcinoma. 6. Interval increase in the multiple retroperitoneal/right perirenal lymph nodes, compatible with metastases. 7. Multiple other nonemergent observations, as described above. Aortic Atherosclerosis (ICD10-I70.0). Critical Value/emergent results were called by telephone at the time of interpretation on 10/18/2023 at 3:13 pm to provider [facility] , who verbally acknowledged these results. Electronically Signed   By: Jules Schick M.D.   On: 10/18/2023 15:13   CT ABDOMEN PELVIS W CONTRAST Result Date: 10/18/2023 CLINICAL DATA:  Pulmonary embolism (PE) suspected, high prob; Abdominal trauma, blunt. Unwitnessed fall. History of metastatic right renal cell carcinoma. * Tracking Code: BO * EXAM: CT ANGIOGRAPHY CHEST CT ABDOMEN AND PELVIS WITH CONTRAST TECHNIQUE: Multidetector CT imaging of the chest was performed using the standard protocol during bolus administration of intravenous contrast. Multiplanar CT image reconstructions and MIPs were obtained to evaluate the vascular anatomy. Multidetector CT imaging of the abdomen and pelvis was performed using the standard protocol during bolus administration of intravenous contrast. RADIATION DOSE REDUCTION: This exam was performed according to the departmental dose-optimization program  which includes automated exposure control, adjustment of the mA and/or kV according to patient size and/or use of iterative reconstruction technique. CONTRAST:  OMNIPAQUE IOHEXOL 350 MG/ML SOLN COMPARISON:  CT scan chest, abdomen and pelvis from 03/22/2022. FINDINGS: CTA CHEST FINDINGS Cardiovascular: There is small volume occlusive and nonocclusive embolism in the segmental pulmonary artery branch in the right lung lower lobe with extension into the subsegmental branch. No right heart strain. No lung infarction. Mild cardiomegaly. No pericardial effusion. No aortic aneurysm. There are coronary artery calcifications, in keeping with coronary artery disease. There are also mild peripheral atherosclerotic vascular calcifications of thoracic aorta and its major branches. There is dilation of the main pulmonary trunk measuring up to 3.4 cm, which is nonspecific but can be seen with pulmonary artery hypertension. Mediastinum/Nodes: Visualized thyroid gland appears  grossly unremarkable. No solid / cystic mediastinal masses. The esophagus is nondistended precluding optimal assessment. There are multiple enlarged mediastinal and bilateral hilar lymph nodes with largest in the right lower paratracheal region measuring up to 1.6 x 3.2 cm. These are indeterminate in etiology but new since the prior study from 07/20/2022. No axillary lymphadenopathy by size criteria. Lungs/Pleura: The central tracheo-bronchial tree is patent. There are heterogeneous opacities bilaterally, predominantly in the perihilar and peribronchovascular region, asymmetrically more in the right upper lobe and left lower lobe. There is also small right pleural effusion. No left pleural effusion. No pneumothorax on either side. Musculoskeletal: The visualized soft tissues of the chest wall are grossly unremarkable. There is expansile lytic lesion in the posterior right third rib with associated bone destruction and soft tissue component, compatible  with metastases. No acute vertebral compression fracture or acute displaced rib fracture multiple left old healed rib fractures noted. There are mild multilevel degenerative changes in the visualized spine. Review of the MIP images confirms the above findings. CT ABDOMEN and PELVIS FINDINGS Hepatobiliary: The liver is normal in size. Non-cirrhotic configuration. No suspicious mass. No intrahepatic or extrahepatic bile duct dilation. Gallbladder is surgically absent. Pancreas: Unremarkable. No pancreatic ductal dilatation or surrounding inflammatory changes. Spleen: Within normal limits. No focal lesion. Adrenals/Urinary Tract: Adrenal glands are unremarkable. There is a large heterogeneous mass in the right kidney currently measuring 7.4 x 9.8 x 10.1 cm, significantly increased since the prior study and compatible with patient's known history of right renal cell carcinoma. There are multiple right perirenal/retrocaval lymph nodes with largest measuring up to 2.3 x 2.5 cm, compatible with metastases. Unremarkable left kidney. No left nephroureterolithiasis or obstructive uropathy. Unremarkable urinary bladder. 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 Redemonstration of nonspecific hyperattenuating soft tissue along the left wall of the duodenal bulb, which may represent metastatic versus primary neoplastic process. No disproportionate dilation of the small or large bowel loops. No evidence of abnormal bowel wall thickening or inflammatory changes. The appendix is unremarkable. Vascular/Lymphatic: No ascites or pneumoperitoneum. Redemonstration of scattered calcifications throughout the abdomen. Interval enlargement of the pre caval lymph node (series 16, image 38), compatible with metastases. No aneurysmal dilation of the major abdominal arteries. There are mild peripheral atherosclerotic vascular calcifications of the aorta and its major branches. Reproductive: Normal-size anteverted  uterus. 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 Redemonstration of a complex mass inseparable from the right side of the fundus of the uterus, grossly similar to the prior study. Other: The visualized soft tissues and abdominal wall are unremarkable. Musculoskeletal: No suspicious osseous lesions. There are mild - moderate multilevel degenerative changes in the visualized spine. Review of the MIP images confirms the above findings. IMPRESSION: 1. Small volume occlusive and nonocclusive embolism in the segmental pulmonary artery branch in the right lung lower lobe, as described above. No right heart strain or lung infarction. 2. Multiple enlarged mediastinal and bilateral hilar lymph nodes, indeterminate. 3. Heterogeneous opacities mainly in the perihilar/peribronchovascular region. Differential diagnosis includes multi lobar pneumonia versus metastatic deposits. Follow-up to clearing is recommended. 4. New expansile lytic lesion of the posterior right third rib with associated bone destruction and soft tissue, compatible with metastases. 5. Interval increase in the large heterogeneous right renal mass, compatible with patient's known history of renal cell carcinoma. 6. Interval increase in the multiple retroperitoneal/right perirenal lymph nodes, compatible with metastases. 7. Multiple other nonemergent observations, as described  above. Aortic Atherosclerosis (ICD10-I70.0). Critical Value/emergent results were called by telephone at the time of interpretation on 10/18/2023 at 3:13 pm to provider [facility] , who verbally acknowledged these results. Electronically Signed   By: Jules Schick M.D.   On: 10/18/2023 15:13   CT Angio Head Neck W WO CM Result Date: 10/18/2023 CLINICAL DATA:  Stroke/TIA, determine embolic source fall, ? thrombus on CT wo con EXAM: CT ANGIOGRAPHY HEAD AND NECK WITH AND WITHOUT CONTRAST  TECHNIQUE: Multidetector CT imaging of the head and neck was performed using the standard protocol during bolus administration of intravenous contrast. Multiplanar CT image reconstructions and MIPs were obtained to evaluate the vascular anatomy. Carotid stenosis measurements (when applicable) are obtained utilizing NASCET criteria, using the distal internal carotid diameter as the denominator. RADIATION DOSE REDUCTION: This exam was performed according to the departmental dose-optimization program which includes automated exposure control, adjustment of the mA and/or kV according to patient size and/or use of iterative reconstruction technique. CONTRAST:  OMNIPAQUE IOHEXOL 350 MG/ML SOLN COMPARISON:  Same-day CT head and cervical spine FINDINGS: CT HEAD FINDINGS See same day CT head for intracranial findings. CTA NECK FINDINGS Aortic arch: Standard branching. Imaged portion shows no evidence of aneurysm or dissection. No significant stenosis of the major arch vessel origins. Right carotid system: No evidence of dissection, stenosis (50% or greater), or occlusion. Left carotid system: No evidence of dissection, stenosis (50% or greater), or occlusion. Vertebral arteries: Right-dominant. No evidence of dissection, stenosis (50% or greater), or occlusion. Skeleton: Severe odontogenic disease. Partially imaged right proximal humeral fracture. Other neck: Negative. The left internal jugular vein is noncontrast opacify, but the hypodensity seen on same day cervical spine CT is redemonstrated and remains worrisome for a DVT. Upper chest: There is a soft tissue mass along the right posterior third rib, compatible with metastatic disease. Small right pleural effusion. Clustered nodularity in the right upper lobe with a consolidative perihilar opacity. 12 mm right axillary lymph node. A 2 mm paratracheal lymph node. See separately dictated CT chest abdomen and pelvis for additional findings. Review of the MIP images  confirms the above findings CTA HEAD FINDINGS Anterior circulation: No proximal occlusion, aneurysm, or vascular malformation. 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 Moderate to severe focal stenosis in the proximal P2 segment of the left PCA. Venous sinuses: As permitted by contrast timing, patent. Anatomic variants: Fetal PCA on the left. Review of the MIP images confirms the above findings IMPRESSION: 1. No intracranial large vessel occlusion. 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 2. No hemodynamically significant stenosis in the neck. 3. The left internal jugular vein is not contrast opacified, but the hypodensity seen on same day cervical spine CT is redemonstrated and remains worrisome for a DVT. 4. See same day CT chest abdomen and pelvis for additional findings Electronically Signed   By: Lorenza Cambridge M.D.   On: 10/18/2023 13:03   DG Elbow 2 Views Right Result Date: 10/18/2023 CLINICAL DATA:  Fall.  Unwitnessed fall.  Right shoulder pain. EXAM: RIGHT ELBOW - 2 VIEW; RIGHT HAND - 2 VIEW COMPARISON:  None Available. FINDINGS: There is diffuse osteopenia of the visualized osseous structures. No acute fracture or dislocation. No aggressive osseous lesion. Mild-to-moderate degenerative changes of imaged joints with asymmetric moderate-to-severe involvement of first carpometacarpal joint. Triangular fibrocartilage complex chondrocalcinosis  noted. No radiopaque foreign bodies. Soft tissues are within normal limits. IMPRESSION: *No acute osseous abnormality of the right elbow or right hand. Electronically Signed   By: Jules Schick M.D.   On: 10/18/2023 11:11   DG Hand 2 View Right Result Date: 10/18/2023 CLINICAL DATA:  Fall.  Unwitnessed fall.  Right shoulder pain. EXAM: RIGHT ELBOW - 2 VIEW; RIGHT HAND - 2 VIEW COMPARISON:  None  Available. FINDINGS: There is diffuse osteopenia of the visualized osseous structures. No acute fracture or dislocation. No aggressive osseous lesion. Mild-to-moderate degenerative changes of imaged joints with asymmetric moderate-to-severe involvement of first carpometacarpal joint. Triangular fibrocartilage complex chondrocalcinosis noted. No radiopaque foreign bodies. Soft tissues are within normal limits. IMPRESSION: *No acute osseous abnormality of the right elbow or right hand. Electronically Signed   By: Jules Schick M.D.   On: 10/18/2023 11:11   DG Shoulder Right Result Date: 10/18/2023 CLINICAL DATA:  Fall.  Right shoulder pain. EXAM: RIGHT SHOULDER - 2+ VIEW COMPARISON:  None Available. FINDINGS: Bone mineralization within normal limits for patient's age. There is mildly displaced fracture of the proximal right humerus. No other acute fracture or dislocation. No aggressive osseous lesion. Glenohumeral and acromioclavicular joints are normal in alignment and exhibit mild-to-moderate degenerative changes. No soft tissue swelling. No radiopaque foreign bodies. IMPRESSION: *Mildly displaced fracture of the proximal right humerus. Electronically Signed   By: Jules Schick M.D.   On: 10/18/2023 11:08   CT HEAD WO CONTRAST Result Date: 10/18/2023 CLINICAL DATA:  Blunt poly trauma EXAM: CT HEAD WITHOUT CONTRAST CT CERVICAL SPINE WITHOUT CONTRAST TECHNIQUE: Multidetector CT imaging of the head and cervical spine was performed following the standard protocol without intravenous contrast. Multiplanar CT image reconstructions of the cervical spine were also generated. RADIATION DOSE REDUCTION: This exam was performed according to the departmental dose-optimization program which includes automated exposure control, adjustment of the mA and/or kV according to patient size and/or use of iterative reconstruction technique. COMPARISON:  06/04/2022 FINDINGS: CT HEAD FINDINGS Brain: No evidence of acute infarction,  hemorrhage, hydrocephalus, extra-axial collection or mass lesion/mass effect. Vascular: No hyperdense vessel or unexpected calcification. Skull: Right frontal scalp swelling without fracture. Sinuses/Orbits: No evidence of injury. Mucosal thickening in the paranasal sinuses which is generalized and progressed from 06/04/2022 CT CERVICAL SPINE FINDINGS Alignment: Normal. Skull base and vertebrae: Negative for cervical spine fracture or subluxation. Progressive, destructive lesion in the right posterior third rib, known metastatic disease. Soft tissues and spinal canal: Expanded and peripherally high-density left internal jugular vein suspicious for thrombus. Disc levels:  Mild degenerative change. Upper chest: Partially covered right upper lobe opacity and adenopathy in the upper chest. IMPRESSION: 1. No evidence of acute intracranial or cervical spine injury. 2. Peripherally high-density left internal jugular vein suggesting thrombus. 3. 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 Electronically Signed   By: Tiburcio Pea M.D.   On: 10/18/2023 10:30   CT Cervical Spine Wo Contrast Result Date: 10/18/2023 CLINICAL DATA:  Blunt poly trauma EXAM: CT HEAD WITHOUT CONTRAST CT CERVICAL SPINE WITHOUT CONTRAST TECHNIQUE: Multidetector CT imaging of the head and cervical spine was performed following the standard protocol without intravenous contrast. Multiplanar CT image reconstructions of the cervical spine were also generated. RADIATION DOSE REDUCTION: This exam was performed according to the departmental dose-optimization program which includes automated exposure control, adjustment of the mA and/or kV according to patient size and/or use of iterative reconstruction technique. COMPARISON:  06/04/2022 FINDINGS: CT HEAD  FINDINGS Brain: No evidence of acute infarction, hemorrhage, hydrocephalus, extra-axial collection or mass lesion/mass effect. Vascular: No  hyperdense vessel or unexpected calcification. Skull: Right frontal scalp swelling without fracture. Sinuses/Orbits: No evidence of injury. Mucosal thickening in the paranasal sinuses which is generalized and progressed from 06/04/2022 CT CERVICAL SPINE FINDINGS Alignment: Normal. Skull base and vertebrae: Negative for cervical spine fracture or subluxation. Progressive, destructive lesion in the right posterior third rib, known metastatic disease. Soft tissues and spinal canal: Expanded and peripherally high-density left internal jugular vein suspicious for thrombus. Disc levels:  Mild degenerative change. Upper chest: Partially covered right upper lobe opacity and adenopathy in the upper chest. IMPRESSION: 1. No evidence of acute intracranial or cervical spine injury. 2. Peripherally high-density left internal jugular vein suggesting thrombus. 3. 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 Electronically Signed   By: Tiburcio Pea M.D.   On: 10/18/2023 10:30    EKG: Independently reviewed.  A-fib with RVR, no acute ST changes.  Assessment/Plan Principal Problem:   Afib (HCC) Active Problems:   A-fib (HCC)   Acute pulmonary embolism (HCC)  (please populate well all problems here in Problem List. (For example, if patient is on BP meds at home and you resume or decide to hold them, it is a problem that needs to be her. Same for CAD, COPD, HLD and so on)  RLL segmental PE -Discussed with on-call hematology Dr. Cathie Hoops, who recommend change Eliquis to Xarelto -Given the size and location of the blood clot, low suspicion for right heart dysfunction.  Outpatient echocardiogram -DVT study  A-fib with RVR -Continue Cardizem drip -Xarelto  Hypomagnesemia -IV replacement in the ED  Acute diarrhea -Will send GI pathogen -Probiotics  CAP bacterial -Atypical pneumonia given 1 week of dry cough -Continue ceftriaxone and doxycycline -Check  atypical study Legionella and mycoplasma -Breathing treatment Atrovent and as needed Xopenex -Incentive spirometry and flutter valve  Metastatic renal cancer -CT chest showed some evidence of age indeterminant lymphadenopathy on the right peribronchial area.  Confirmed that patient wishes DNR status and hospice team will follow the patient as well. -Taken off immunotherapy in November 2023 given the poor tolerance -Expect patient go back to facility to continue hospice care after this hospitalization  IIDM -Hold off metformin -Continue Lantus 23 units daily -SSI  Fall and right humerus fracture -Right shoulder-arm sling in place -PT evaluation  DVT prophylaxis: Xarelto Code Status: DNR Family Communication: Family at bedside Disposition Plan: Patient is sick with A-fib with RVR, requiring IV rate control medications, expect more than 2 midnight hospital stay Consults called: None Admission status: PCU admit   Emeline General MD [facility] Pager [phone]  10/18/2023, 3:58 PM

## 2023-10-18 NOTE — ED Triage Notes (Signed)
Pt here via [facility] after a fall. Pt has a large hematome to the right side of her head. Pt also has a right shoulder injury and several abrasions on her right arm. Pt denies remembering the fall. Pt is currently under hospice care and has been declining for the past few months.

## 2023-10-18 NOTE — ED Notes (Signed)
Pt had water BM mixed with urine.  Pt cleaned, brief changed, and Pt repositioned in the bed.

## 2023-10-18 NOTE — ED Notes (Signed)
Patient transported to CT

## 2023-10-19 ENCOUNTER — Telehealth (HOSPITAL_COMMUNITY): Payer: Self-pay | Admitting: Pharmacy Technician

## 2023-10-19 ENCOUNTER — Encounter: Payer: Self-pay | Admitting: Internal Medicine

## 2023-10-19 ENCOUNTER — Encounter: Payer: Self-pay | Admitting: Oncology

## 2023-10-19 ENCOUNTER — Other Ambulatory Visit (HOSPITAL_COMMUNITY): Payer: Self-pay

## 2023-10-19 DIAGNOSIS — S42201A Unspecified fracture of upper end of right humerus, initial encounter for closed fracture: Secondary | ICD-10-CM | POA: Insufficient documentation

## 2023-10-19 DIAGNOSIS — Z794 Long term (current) use of insulin: Secondary | ICD-10-CM

## 2023-10-19 DIAGNOSIS — F3289 Other specified depressive episodes: Secondary | ICD-10-CM

## 2023-10-19 DIAGNOSIS — I2699 Other pulmonary embolism without acute cor pulmonale: Secondary | ICD-10-CM | POA: Diagnosis not present

## 2023-10-19 DIAGNOSIS — I480 Paroxysmal atrial fibrillation: Secondary | ICD-10-CM | POA: Diagnosis not present

## 2023-10-19 DIAGNOSIS — J189 Pneumonia, unspecified organism: Secondary | ICD-10-CM

## 2023-10-19 DIAGNOSIS — E663 Overweight: Secondary | ICD-10-CM

## 2023-10-19 DIAGNOSIS — R197 Diarrhea, unspecified: Secondary | ICD-10-CM | POA: Insufficient documentation

## 2023-10-19 DIAGNOSIS — E1165 Type 2 diabetes mellitus with hyperglycemia: Secondary | ICD-10-CM

## 2023-10-19 DIAGNOSIS — C641 Malignant neoplasm of right kidney, except renal pelvis: Secondary | ICD-10-CM

## 2023-10-19 DIAGNOSIS — I4891 Unspecified atrial fibrillation: Secondary | ICD-10-CM | POA: Diagnosis present

## 2023-10-19 DIAGNOSIS — E871 Hypo-osmolality and hyponatremia: Secondary | ICD-10-CM

## 2023-10-19 LAB — C DIFFICILE QUICK SCREEN W PCR REFLEX
C Diff antigen: NEGATIVE
C Diff interpretation: NOT DETECTED
C Diff toxin: NEGATIVE

## 2023-10-19 LAB — BASIC METABOLIC PANEL
Anion gap: 9 (ref 5–15)
BUN: 17 mg/dL (ref 8–23)
CO2: 24 mmol/L (ref 22–32)
Calcium: 9.8 mg/dL (ref 8.9–10.3)
Chloride: 100 mmol/L (ref 98–111)
Creatinine, Ser: 0.62 mg/dL (ref 0.44–1.00)
GFR, Estimated: >60 mL/min (ref >60)
Glucose, Bld: 227 mg/dL — ABNORMAL HIGH (ref 70–99)
Potassium: 3.5 mmol/L (ref 3.5–5.1)
Sodium: 133 mmol/L — ABNORMAL LOW (ref 135–145)

## 2023-10-19 LAB — CBC
HCT: 34.4 % — ABNORMAL LOW (ref 36.0–46.0)
Hemoglobin: 10.6 g/dL — ABNORMAL LOW (ref 12.0–15.0)
MCH: 25.4 pg — ABNORMAL LOW (ref 26.0–34.0)
MCHC: 30.8 g/dL (ref 30.0–36.0)
MCV: 82.5 fL (ref 80.0–100.0)
Platelets: 333 10*3/uL (ref 150–400)
RBC: 4.17 MIL/uL (ref 3.87–5.11)
RDW: 14.6 % (ref 11.5–15.5)
WBC: 13.1 10*3/uL — ABNORMAL HIGH (ref 4.0–10.5)
nRBC: 0.2 % (ref 0.0–0.2)

## 2023-10-19 LAB — CBG MONITORING, ED
Glucose-Capillary: 279 mg/dL — ABNORMAL HIGH (ref 70–99)
Glucose-Capillary: 248 mg/dL — ABNORMAL HIGH (ref 70–99)
Glucose-Capillary: 270 mg/dL — ABNORMAL HIGH (ref 70–99)

## 2023-10-19 LAB — GLUCOSE, CAPILLARY: Glucose-Capillary: 232 mg/dL — ABNORMAL HIGH (ref 70–99)

## 2023-10-19 MED ORDER — CARVEDILOL 3.125 MG PO TABS
6.2500 mg | ORAL_TABLET | Freq: Two times a day (BID) | ORAL | Status: DC
  Administered 2023-10-19 (×2): 6.25 mg via ORAL
  Filled 2023-10-19 (×2): qty 1

## 2023-10-19 MED ORDER — DILTIAZEM HCL ER COATED BEADS 240 MG PO CP24
240.0000 mg | ORAL_CAPSULE | Freq: Every evening | ORAL | Status: DC
  Administered 2023-10-19 – 2023-10-21 (×3): 240 mg via ORAL
  Filled 2023-10-19 (×5): qty 1

## 2023-10-19 MED ORDER — DILTIAZEM HCL 60 MG PO TABS
60.0000 mg | ORAL_TABLET | Freq: Four times a day (QID) | ORAL | Status: DC
  Administered 2023-10-19 (×2): 60 mg via ORAL
  Filled 2023-10-19 (×2): qty 1

## 2023-10-19 NOTE — Progress Notes (Signed)
PT Cancellation Note  Patient Details Name: Barbara Frank MRN: [id number] DOB: 11-01-1945   Cancelled Treatment:    Reason Eval/Treat Not Completed: Medical issues which prohibited therapy Spoke with MD about pt's medical situation.  Pt still with elevated HR (on cardizem) as well as having PE.  Will hold PT this date, MD hoping to have pt seen by PT tomorrow AM to address d/c planning/safety.  Malachi Pro, DPT 10/19/2023, 10:49 AM

## 2023-10-19 NOTE — Assessment & Plan Note (Addendum)
On admission stool comprehensive panel negative and C. difficile negative.

## 2023-10-19 NOTE — Assessment & Plan Note (Signed)
BMI 29.27

## 2023-10-19 NOTE — Telephone Encounter (Signed)
Patient Product/process development scientist completed.    The patient is insured through [employer]. Patient has Medicare and is not eligible for a copay card, but may be able to apply for patient assistance or Medicare RX Payment Plan (Patient Must reach out to their plan, if eligible for payment plan), if available.    Ran test claim for Xarelto 20 mg and the current 30 day co-pay is $40.00.   This test claim was processed through [facility]- copay amounts may vary at other pharmacies due to pharmacy/plan contracts, or as the patient moves through the different stages of their insurance plan.     Barbara Frank, CPHT Pharmacy Technician III Certified Patient Advocate [facility] Patient Advocate Team Direct Number: [phone]  Fax: [phone]

## 2023-10-19 NOTE — Progress Notes (Addendum)
[facility] ED [facility] Liaison Note  Barbara Frank is a current hospice patient with [facility], contracting with a terminal diagnosis of malignant neoplasm of kidney. Patient reports she experienced a fall at her facility when she caught her foot on the foot of the bed.Facility called hospice to report that patient had a large knot on her head, elevated BP and elevated HR and that EMS had been called. Patient was admitted to [facility] 2.11.2025 with a diagnosis of paroxysmal atrial fibrillation with rapid ventricular response, acute pulmonary embolism, multifocal pneumonia and closed fracture of proximal end of right humerus. Per Dr. Anne Fu with [facility] , this is a related hospital admission.  Visited with patient, PCG's Duanne Guess and Tonya at the bedside.Patient is pleasant and without complaints. She has a large bruise on her right forehead/head and is wearing a sling on her right arm. Her spaghetti remains mostly untouched but Duanne Guess reports she ate her pineapple and graham crackers.  Per bedside RN, HR was not controlled on Diltiazem drip. Switched to po Metoprolol and Cardizem and heart rate is coming down nicely. RN reports patient has not had any complaints of pain except when repositioning patient.  Patient is GIP appropriate for medication management of HR, continued cardiac monitoring, pain evaluation from fracture, electrolyte replacement and treatment of suspected pneumonia with IV antibiotics.  VS: 98.6, 125/86, 109, RR 22, 96% on room air  I/O: 353/not recorded  Abnormal Labs: Sodium: 130 (L) Potassium: 4.3 Chloride: 93 (L) Glucose: 350 (H) Phosphorus: 2.4 (L) Magnesium: 1.5 (L) Albumin: 3.1 (L) WBC: 12.6 (H) Hemoglobin: 11.7 (L) MCH: 25.3 (L) Hemoglobin A1C: 11.1 (H)  Diagnostics: DG Shoulder Right IMPRESSION: *Mildly displaced fracture of the proximal right humerus.  CT Head/CT Cervical Spine IMPRESSION: 1. No evidence of acute intracranial or cervical  spine injury. 2. Peripherally high-density left internal jugular vein suggesting thrombus. 3. 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  DG Elbow/Hand 2 views IMPRESSION: *No acute osseous abnormality of the right elbow or right hand.  IV/PRN meds: Cardizem 1 mg/ml infusion 200 mg loading dose, then continuous infusion @ 15 mg/hr, Rocephin 2 g X 1 and then 1 g Q 24hours IV, LR @ 150 cc/hr, magnesium sulfate 1 g X 1, 0.9% NaCl 1000 cc bolus.  Assessment and plan as per Dr. Cristine Polio note from 2.12.2025  Assessment and Plan: * Paroxysmal atrial fibrillation with rapid ventricular response (HCC) Patient placed on Cardizem drip.  Will try to taper Cardizem drip and start oral Coreg and oral Cardizem.  Patient on Eliquis for anticoagulation.   Acute pulmonary embolism [facility]) Oncology recommending changing Eliquis to Xarelto.  Lower extremity ultrasound negative for DVT.  Left internal jugular DVT   Multifocal pneumonia Started on Rocephin and doxycycline   Renal cancer, right (HCC) Right renal mass enlarging in size.  Mets to lung and bone.  Followed by hospice.  Patient is a DNR.   Hypomagnesemia Patient received IV magnesium   Hyponatremia Continue to follow   Uncontrolled type 2 diabetes mellitus with hyperglycemia, with long-term current use of insulin (HCC) Hemoglobin A1c 11.1.  Patient on 23 units of Semglee insulin and sliding scale.  Holding metformin.   Overweight (BMI 25.0-29.9) BMI 29.27   Closed fracture of proximal end of right humerus Sling.  Pain control.   Diarrhea Stool for C. difficile ordered.  Comprehensive stool panel negative.   Depression On Lexapro  Discharge Planning: met with Lissa Hoard (hospital  liaison) and Elouise Munroe, NP with cancer center. Discussed results and next steps moving forward.  Plan is to continue to evaluate here in the hospital and monitor. Treat what  can be treated with current plan in place with hopeful plan to return to her facility with the support of hospice. Discussed ordering a wheelchair for mobility at the facility and PT to help with transfers. Will continue to assess for appropriate discharge disposition.  Family contact: as above  IDT: updated  Goals of Care: clear, DNR.  Please call with any hospice related questions or concerns.  Thank you, Haynes Bast, BSN, [facility] Liaison  [phone]

## 2023-10-19 NOTE — Assessment & Plan Note (Signed)
On Lexapro

## 2023-10-19 NOTE — Assessment & Plan Note (Addendum)
Hemoglobin A1c 11.1.  Increased to 27 units of Semglee insulin for tomorrow and sliding scale.  Holding metformin.

## 2023-10-19 NOTE — ED Notes (Signed)
Pt lunch set up for her

## 2023-10-19 NOTE — Hospital Course (Addendum)
78 year old female past medical history of metastatic renal cancer to the lungs with retroperitoneal lymph chemotherapy and immunotherapy followed by hospice.  Patient also has a history of paroxysmal atrial fibrillation on Eliquis, diabetes, hypertension presented with new onset cough and shortness of breath and a fall.  Symptoms started about a week ago with a dry cough.  She is also sore on her abdomen from all the coughing.  She has been taking laxatives but now has diarrhea.  CT scan of the chest showed a small pulmonary embolism, left internal jugular DVT, enlarging right renal mass and metastatic lesion to the rib with pneumonia versus metastasis in the chest.  Patient started on antibiotics.  Patient also started on Cardizem drip for rapid atrial fibrillation.  2/12.  Add oral Cardizem and oral Coreg to try to taper off Cardizem drip.  Continue antibiotics. 2/13.  Patient lethargic after pain medication. 2/14.  CT scan negative. 2/15.  Patient did not sleep very well last night.  Will order as needed Seroquel if does not sleep tonight. 2/16.  Slept better last night.  Not having any pain.  Able to straight leg raise today.  Mental status improved. 2/17.  More confused today than yesterday.  Yesterday was a good day. 2/18.  Patient had a better day today.  More alert and able to follow commands and straight leg raise.  Replaced IV magnesium.  Still awaiting a bed offer

## 2023-10-19 NOTE — Assessment & Plan Note (Addendum)
Right renal mass enlarging in size.  Mets to lung and bone.  Patient is a DNR.  In order to go to rehab they rescinded hospice.

## 2023-10-19 NOTE — Assessment & Plan Note (Addendum)
Replaced

## 2023-10-19 NOTE — ED Notes (Signed)
Pt only ate half of a dinner roll for dinner, this RN encouraged pt to eat some and she refused at this time.

## 2023-10-19 NOTE — Assessment & Plan Note (Signed)
Patient tapered off Cardizem drip on 10/19/2023.  Patient on increased dose of Coreg and Cardizem CD.  Patient on Xarelto for anticoagulation.

## 2023-10-19 NOTE — Assessment & Plan Note (Addendum)
Sling.  Oral pain medications only.  Not a candidate for surgery.

## 2023-10-19 NOTE — Progress Notes (Signed)
Progress Note   Patient: Barbara Frank KGM:[id number] DOB: 10-30-1945 DOA: 10/18/2023     1 DOS: the patient was seen and examined on 10/19/2023   Brief hospital course: 78 year old female past medical history of metastatic renal cancer to the lungs with retroperitoneal lymph chemotherapy and immunotherapy followed by hospice.  Patient also has a history of paroxysmal atrial fibrillation on Eliquis, diabetes, hypertension presented with new onset cough and shortness of breath and a fall.  Symptoms started about a week ago with a dry cough.  She is also sore on her abdomen from all the coughing.  She has been taking laxatives but now has diarrhea.  CT scan of the chest showed a small pulmonary embolism, left internal jugular DVT, enlarging right renal mass and metastatic lesion to the rib with pneumonia versus metastasis in the chest.  Patient started on antibiotics.  Patient also started on Cardizem drip for rapid atrial fibrillation.  Acute/12.  Add oral Cardizem and oral Coreg to try to taper off Cardizem drip.  Continue antibiotics.  Assessment and Plan: * Paroxysmal atrial fibrillation with rapid ventricular response (HCC) Patient placed on Cardizem drip.  Will try to taper Cardizem drip and start oral Coreg and oral Cardizem.  Patient on Eliquis for anticoagulation.  Acute pulmonary embolism [facility]) Oncology recommending changing Eliquis to Xarelto.  Lower extremity ultrasound negative for DVT.  Left internal jugular DVT  Multifocal pneumonia Started on Rocephin and doxycycline  Renal cancer, right (HCC) Right renal mass enlarging in size.  Mets to lung and bone.  Followed by hospice.  Patient is a DNR.  Hypomagnesemia Patient received IV magnesium  Hyponatremia Continue to follow  Uncontrolled type 2 diabetes mellitus with hyperglycemia, with long-term current use of insulin (HCC) Hemoglobin A1c 11.1.  Patient on 23 units of Semglee insulin and sliding scale.  Holding  metformin.  Overweight (BMI 25.0-29.9) BMI 29.27  Closed fracture of proximal end of right humerus Sling.  Pain control.  Diarrhea Stool for C. difficile ordered.  Comprehensive stool panel negative.  Depression On Lexapro        Subjective: Patient complains of coughing and soreness in her abdomen secondary to coughing.  Had some diarrhea.  Physical Exam: Vitals:   10/19/23 1100 10/19/23 1107 10/19/23 1130 10/19/23 1200  BP: (!) 146/81 (!) 146/81 139/77 (!) 150/85  Pulse: 97  98 95  Resp: 17  (!) 27 (!) 29  Temp:    98.6 F (37 C)  TempSrc:      SpO2: 94%  94% 93%  Weight:      Height:       Physical Exam HENT:     Head: Normocephalic.     Mouth/Throat:     Pharynx: No oropharyngeal exudate.  Eyes:     General: Lids are normal.     Conjunctiva/sclera: Conjunctivae normal.  Cardiovascular:     Rate and Rhythm: Tachycardia present. Rhythm irregularly irregular.     Heart sounds: Normal heart sounds, S1 normal and S2 normal.  Pulmonary:     Breath sounds: Examination of the right-lower field reveals decreased breath sounds and rhonchi. Examination of the left-lower field reveals decreased breath sounds and rhonchi. Decreased breath sounds and rhonchi present. No wheezing or rales.  Abdominal:     Palpations: Abdomen is soft.     Tenderness: There is generalized abdominal tenderness.  Musculoskeletal:     Right lower leg: Swelling present.     Left lower leg: Swelling present.  Skin:  General: Skin is warm.     Findings: No rash.  Neurological:     Mental Status: She is alert and oriented to person, place, and time.     Data Reviewed: Hemoglobin A1c of 11.1, sodium 130, magnesium 1.5, creatinine 0.64, white blood count 13.1, hemoglobin 10.6, platelet count 333.  Family Communication: Spoke with POA  Disposition: Status is: Inpatient Remains inpatient appropriate because: Trying to taper off Cardizem drip and again on oral medications continue IV  antibiotics for pneumonia  Planned Discharge Destination: To be determined    Time spent: 28 minutes  Author: Alford Highland, MD 10/19/2023 12:35 PM  For on call review [email or web address].

## 2023-10-19 NOTE — Assessment & Plan Note (Signed)
Last sodium normal range

## 2023-10-19 NOTE — Assessment & Plan Note (Addendum)
Patient on Rocephin and doxycycline.  Will do 7 days total.  After coughing lungs sound better than yesterday.  Continue nebulizer treatments.

## 2023-10-19 NOTE — Assessment & Plan Note (Signed)
Oncology recommending changing Eliquis to Xarelto.  Lower extremity ultrasound negative for DVT.  Left internal jugular DVT.

## 2023-10-19 NOTE — ED Notes (Signed)
Pt dinner set up for her

## 2023-10-20 DIAGNOSIS — C641 Malignant neoplasm of right kidney, except renal pelvis: Secondary | ICD-10-CM | POA: Diagnosis not present

## 2023-10-20 DIAGNOSIS — S42291S Other displaced fracture of upper end of right humerus, sequela: Secondary | ICD-10-CM

## 2023-10-20 DIAGNOSIS — I2699 Other pulmonary embolism without acute cor pulmonale: Secondary | ICD-10-CM | POA: Diagnosis not present

## 2023-10-20 DIAGNOSIS — I480 Paroxysmal atrial fibrillation: Secondary | ICD-10-CM | POA: Diagnosis not present

## 2023-10-20 DIAGNOSIS — R5383 Other fatigue: Secondary | ICD-10-CM

## 2023-10-20 DIAGNOSIS — J189 Pneumonia, unspecified organism: Secondary | ICD-10-CM | POA: Diagnosis not present

## 2023-10-20 LAB — GLUCOSE, CAPILLARY
Glucose-Capillary: 219 mg/dL — ABNORMAL HIGH (ref 70–99)
Glucose-Capillary: 241 mg/dL — ABNORMAL HIGH (ref 70–99)
Glucose-Capillary: 222 mg/dL — ABNORMAL HIGH (ref 70–99)
Glucose-Capillary: 190 mg/dL — ABNORMAL HIGH (ref 70–99)
Glucose-Capillary: 229 mg/dL — ABNORMAL HIGH (ref 70–99)

## 2023-10-20 LAB — LEGIONELLA PNEUMOPHILA SEROGP 1 UR AG: L. pneumophila Serogp 1 Ur Ag: NEGATIVE

## 2023-10-20 MED ORDER — GLUCERNA SHAKE PO LIQD
237.0000 mL | Freq: Three times a day (TID) | ORAL | Status: DC
  Administered 2023-10-20 – 2023-10-27 (×15): 237 mL via ORAL

## 2023-10-20 MED ORDER — CARVEDILOL 12.5 MG PO TABS
12.5000 mg | ORAL_TABLET | Freq: Two times a day (BID) | ORAL | Status: DC
  Administered 2023-10-20 – 2023-10-27 (×15): 12.5 mg via ORAL
  Filled 2023-10-20 (×15): qty 1

## 2023-10-20 MED ORDER — ENSURE ENLIVE PO LIQD: 237.0000 mL | Freq: Two times a day (BID) | ORAL | Status: DC

## 2023-10-20 MED ORDER — OXYCODONE HCL 5 MG PO TABS
5.0000 mg | ORAL_TABLET | Freq: Four times a day (QID) | ORAL | Status: DC | PRN
  Administered 2023-10-20: 5 mg via ORAL
  Filled 2023-10-20: qty 1

## 2023-10-20 NOTE — Plan of Care (Signed)
  Problem: Metabolic: Goal: Ability to maintain appropriate glucose levels will improve Outcome: Progressing   Problem: Nutritional: Goal: Maintenance of adequate nutrition will improve Outcome: Progressing   Problem: Skin Integrity: Goal: Risk for impaired skin integrity will decrease Outcome: Progressing   Problem: Tissue Perfusion: Goal: Adequacy of tissue perfusion will improve Outcome: Progressing   Problem: Clinical Measurements: Goal: Ability to maintain clinical measurements within normal limits will improve Outcome: Progressing

## 2023-10-20 NOTE — Assessment & Plan Note (Addendum)
Patient did not sleep very well last night.  Answer some questions.  Does not feel hungry this morning.  Ate 1 bite of egg while I was in the room.  Will give Seroquel at night as needed if she does not sleep.

## 2023-10-20 NOTE — Progress Notes (Signed)
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  Patient also has a history of paroxysmal atrial fibrillation on Eliquis, diabetes, hypertension presented with new onset cough and shortness of breath and a fall.  Symptoms started about a week ago with a dry cough.  She is also sore on her abdomen from all the coughing.  She has been taking laxatives but now has diarrhea.  CT scan of the chest showed a small pulmonary embolism, left internal jugular DVT, enlarging right renal mass and metastatic lesion to the rib with pneumonia versus metastasis in the chest.  Patient started on antibiotics.  Patient also started on Cardizem drip for rapid atrial fibrillation.  2/12.  Add oral Cardizem and oral Coreg to try to taper off Cardizem drip.  Continue antibiotics. 2/13.  Patient lethargic after pain medication.  Assessment and Plan: * Paroxysmal atrial fibrillation with rapid ventricular response (HCC) Patient tapered off Cardizem drip on 10/19/2023.  Patient on increased dose of Coreg and Cardizem CD.  Patient on Xarelto for anticoagulation.  Acute pulmonary embolism [facility]) Oncology recommending changing Eliquis to Xarelto.  Lower extremity ultrasound negative for DVT.  Left internal jugular DVT  Multifocal pneumonia Continue on Rocephin and doxycycline  Renal cancer, right (HCC) Right renal mass enlarging in size.  Mets to lung and bone.  Followed by hospice.  Patient is a DNR.  Hypomagnesemia Replaced  Hyponatremia Continue to follow  Uncontrolled type 2 diabetes mellitus with hyperglycemia, with long-term current use of insulin (HCC) Hemoglobin A1c 11.1.  Patient on 23 units of Semglee insulin and sliding scale.   Holding metformin.  Sugars still elevated but patient not eating very well so hesitant on going up on the Semglee insulin.  Overweight (BMI 25.0-29.9) BMI 29.27  Lethargy Patient just received IV pain medications when I saw her so she was little lethargic.  Continue to monitor.  Change IV pain medications over to oral.  Closed fracture of proximal end of right humerus Sling.  Pain control changed to oral medications only since lethargic with IV meds.  Diarrhea Stool comprehensive panel negative and C. difficile negative.  Depression On Lexapro        Subjective: Patient lethargic after being given Dilaudid for shoulder pain.  Will switch over to oral pain medications and get rid of IV pain medication.  Admitted with pneumonia and fall.  Found to have a right proximal humerus fracture.  Physical Exam: Vitals:   10/19/23 1859 10/19/23 2021 10/20/23 0803 10/20/23 0901  BP: (!) 140/115 (!) 146/71 (!) 144/80   Pulse: (!) 104 (!) 104 (!) 108   Resp: 20 17 17    Temp: 98.4 F (36.9 C) 98.3 F (36.8 C) 98.6 F (37 C)   TempSrc:      SpO2: 94% 94% 92% 94%  Weight:      Height:       Physical Exam HENT:     Head: Normocephalic.     Mouth/Throat:     Pharynx: No oropharyngeal exudate.  Eyes:     General: Lids are normal.     Conjunctiva/sclera: Conjunctivae normal.  Cardiovascular:     Rate and Rhythm: Normal rate. Rhythm irregularly irregular.     Heart sounds: Normal heart sounds, S1 normal  and S2 normal.  Pulmonary:     Breath sounds: Examination of the right-lower field reveals decreased breath sounds. Examination of the left-lower field reveals decreased breath sounds. Decreased breath sounds present. No wheezing, rhonchi or rales.  Abdominal:     Palpations: Abdomen is soft.     Tenderness: There is no abdominal tenderness.  Musculoskeletal:     Right lower leg: Swelling present.     Left lower leg: Swelling present.     Comments: Right arm in sling.  Skin:     General: Skin is warm.     Findings: No rash.  Neurological:     Mental Status: She is alert and oriented to person, place, and time.     Data Reviewed: Sodium 133, creatinine 0.62, white blood count 13.1, hemoglobin 10.6, platelet count 333.  Family Communication: Updated contact on the phone.  Disposition: Status is: Inpatient Remains inpatient appropriate because: Lethargic today after pain medication. Planned Discharge Destination: Either back to her assisted living or will have to look into rehab facility.  TOC and hospice to look into options.    Time spent: 28 minutes  Author: Alford Highland, MD 10/20/2023 12:37 PM  For on call review [email or web address].

## 2023-10-20 NOTE — Evaluation (Signed)
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  Patient also has a history of paroxysmal atrial fibrillation on Eliquis, diabetes, hypertension     Clinical Impressions Chart reviewed, pt greeted in bed, oriented to self, confused throughout but agreeable to attempting mobility. During mobility, pt is anxious throughout and requires step by step cueing for continued participation. Bed mobility completed with MOD-MAX A, STS with MOD A +2 with hemi walker. Pt refuses transfer to bedside chair despite max encouragement. MIN A required for grooming tasks, MAX A for toileting/LB dressing. MAX A to reposition RUE in sling; pt is R hand dominant per her report. Pt will benefit from acute OT to address deficits and to facilitate optimal ADL participation. Pt is left in bed, all needs met. OT will continue to follow.    If plan is discharge home, recommend the following:   A lot of help with walking and/or transfers;A lot of help with bathing/dressing/bathroom     Functional Status Assessment   Patient has had a recent decline in their functional status and demonstrates the ability to make significant improvements in function in a reasonable and predictable amount of time.     Equipment Recommendations   Wheelchair (measurements OT)     Recommendations for Other Services         Precautions/Restrictions   Precautions Precautions: Fall Required Braces or Orthoses: Sling Restrictions Weight Bearing Restrictions Per Provider Order: No (assumed NWBing  due to fracture)     Mobility Bed Mobility Overal bed mobility: Needs Assistance Bed Mobility: Sit to Supine     Supine to sit: Max assist, Mod assist     General bed mobility comments: step by step cueing required    Transfers Overall transfer level: Needs assistance Equipment used: Hemi-walker Transfers: Sit to/from Stand Sit to Stand: +2 physical assistance, Mod assist           General transfer comment: step by step multi modal cues      Balance Overall balance assessment: Needs assistance Sitting-balance support: Feet supported Sitting balance-Leahy Scale: Fair     Standing balance support: Single extremity supported Standing balance-Leahy Scale: Poor                             ADL either performed or assessed with clinical judgement   ADL Overall ADL's : Needs assistance/impaired     Grooming: Wash/dry face;Sitting;Minimal assistance           Upper Body Dressing : Maximal assistance Upper Body Dressing Details (indicate cue type and reason): sling Lower Body Dressing: Maximal assistance;Sitting/lateral leans;Sit to/from stand       Toileting- Architect and Hygiene: Maximal assistance               Vision Patient Visual Report: No change from baseline Additional Comments: will continue to assess     Perception         Praxis         Pertinent Vitals/Pain Pain Assessment Pain Assessment: PAINAD Breathing: normal Negative Vocalization: occasional moan/groan, low speech, negative/disapproving quality Facial  Expression: sad, frightened, frown Body Language: tense, distressed pacing, fidgeting Consolability: distracted or reassured by voice/touch PAINAD Score: 4 Pain Intervention(s): Monitored during session, Repositioned (RN notified)     Extremity/Trunk Assessment Upper Extremity Assessment Upper Extremity Assessment: RUE deficits/detail RUE Deficits / Details: RUE in sling throughout   Lower  Extremity Assessment Lower Extremity Assessment: Generalized weakness       Communication Communication Communication: Impaired Factors Affecting Communication: Difficulty expressing self   Cognition Arousal: Alert Behavior During Therapy: Anxious Cognition: No family/caregiver present to determine baseline, Cognition impaired   Orientation impairments: Place, Time, Situation Awareness: Intellectual awareness impaired, Online awareness impaired Memory impairment (select all impairments): Short-term memory Attention impairment (select first level of impairment): Sustained attention Executive functioning impairment (select all impairments): Initiation                   Following commands: Impaired Following commands impaired: Follows one step commands with increased time     Cueing  General Comments   Cueing Techniques: Verbal cues;Visual cues;Tactile cues      Exercises Other Exercises Other Exercises: edu re: role of OT, role of rehab, discharge recommendations   Shoulder Instructions      Home Living Family/patient expects to be discharged to:: Assisted living                                 Additional Comments: [facility]      Prior Functioning/Environment Prior Level of Function : Needs assist;Patient poor historian/Family not available;History of Falls (last six months)             Mobility Comments: pt is confused, poor historian ADLs Comments: pt is confused, poor historian    OT Problem List: Decreased strength;Decreased activity tolerance;Decreased knowledge of use of DME or AE;Decreased safety awareness;Decreased cognition;Impaired balance (sitting and/or standing);Impaired UE functional use   OT Treatment/Interventions: Self-care/ADL training;DME and/or AE instruction;Therapeutic activities;Balance training;Therapeutic exercise;Neuromuscular education;Visual/perceptual remediation/compensation;Patient/family education;Energy  conservation      OT Goals(Current goals can be found in the care plan section)   Acute Rehab OT Goals Patient Stated Goal: just lie back down OT Goal Formulation: With patient Time For Goal Achievement: 11/03/23 Potential to Achieve Goals: Good ADL Goals Pt Will Perform Grooming: sitting;with supervision Pt Will Perform Lower Body Dressing: with min assist;sitting/lateral leans Pt Will Transfer to Toilet: stand pivot transfer;with contact guard assist Pt Will Perform Toileting - Clothing Manipulation and hygiene: with contact guard assist;sit to/from stand;sitting/lateral leans   OT Frequency:  Min 1X/week    Co-evaluation PT/OT/SLP Co-Evaluation/Treatment: Yes Reason for Co-Treatment: To address functional/ADL transfers;Other (comment) (tolerance)   OT goals addressed during session: ADL's and self-care      AM-PAC OT "6 Clicks" Daily Activity     Outcome Measure Help from another person eating meals?: A Little Help from another person taking care of personal grooming?: A Little Help from another person toileting, which includes using toliet, bedpan, or urinal?: A Lot Help from another person bathing (including washing, rinsing, drying)?: A Lot Help from another person to put on and taking off regular upper body clothing?: A Lot Help from another person to put on and taking off regular lower body clothing?: A Lot 6 Click Score: 14   End of Session Equipment Utilized During Treatment: Gait belt;Other (comment) (hemi walker) Nurse Communication: Mobility status  Activity Tolerance: Patient limited by pain Patient left: in bed;with call bell/phone within reach;with bed alarm  set  OT Visit Diagnosis: Other abnormalities of gait and mobility (R26.89);Muscle weakness (generalized) (M62.81);Unsteadiness on feet (R26.81)                Time: 6213-0865 OT Time Calculation (min): 17 min Charges:  OT General Charges $OT Visit: 1 Visit OT Evaluation $OT Eval Moderate  Complexity: 1 Mod  Oleta Mouse, OTD OTR/L  10/20/23, 12:20 PM

## 2023-10-20 NOTE — Evaluation (Signed)
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  Patient also has a history of paroxysmal atrial fibrillation on Eliquis, diabetes, hypertension  Clinical Impression  Pt with some confusion t/o the session that made full participation difficult.  She needed consistent and repeated cuing and encouragement for each aspect of the eval/treat.  She was able to do some minimal mobility and stand at EOB but was resistant to doing any stepping, weight shifts or attempt to get to the recliner despite much encouragement.  Pt will need further PT to address functional limitations.       If plan is discharge home, recommend the following: Two people to help with walking and/or transfers;Two people to help with bathing/dressing/bathroom;Assistance with cooking/housework;Assistance with feeding;Direct supervision/assist for medications management;Direct supervision/assist for financial management;Assist for transportation;Supervision due to cognitive status   Can travel by private vehicle   No    Equipment Recommendations  (TBD, likely will need HW)  Recommendations for Other Services       Functional Status Assessment Patient has had a recent decline in their functional status and demonstrates the ability to make significant improvements in function in a reasonable and predictable amount of time.     Precautions / Restrictions Precautions Precautions: Fall Required Braces or Orthoses: Sling Restrictions Weight Bearing Restrictions Per Provider Order:  (de facto R UE NWBing,  prox humerus fx)      Mobility  Bed Mobility Overal bed mobility: Needs Assistance Bed Mobility: Sidelying to Sit, Supine to Sit   Sidelying to sit: Mod assist, Max assist Supine to sit: Max assist, Mod assist     General bed mobility comments: Pt hesitant to try doing a lot, she ultimatley was convinced to do a little activity but needed constant cuing and reassurance.  She showed some effort with getting toward EOB and get LEs around but ultimately needed assist with all aspects of transition to/from supine/sitting    Transfers Overall transfer level: Needs assistance Equipment used: Hemi-walker Transfers: Sit to/from Stand Sit to Stand: +2 physical assistance, Mod assist           General transfer comment: Again pt needed encouragement and extra cuing to convince her to attempt, physical assist to stand, use HW appropriately and maintain balance once upright.    Ambulation/Gait                  Stairs            Wheelchair Mobility     Tilt Bed    Modified Rankin (Stroke Patients Only)       Balance Overall balance assessment: Needs assistance                                           Pertinent Vitals/Pain Pain Assessment Pain Assessment: Faces Faces Pain Scale: Hurts even more Pain Location: hyper responsive to any movement of the R UE    Home Living Family/patient expects to be discharged to:: Assisted living  Additional Comments: [facility]    Prior Function Prior Level of Function : Needs assist;Patient poor historian/Family not available;History of Falls (last six months)             Mobility Comments: pt is confused, poor historian ADLs Comments: pt is confused, poor historian     Extremity/Trunk Assessment   Upper Extremity Assessment Upper Extremity Assessment: RUE deficits/detail RUE Deficits / Details: RUE in sling throughout    Lower Extremity Assessment Lower  Extremity Assessment: Generalized weakness       Communication   Communication Communication: Impaired Factors Affecting Communication: Difficulty expressing self    Cognition Arousal: Alert Behavior During Therapy: Anxious                             Following commands: Impaired Following commands impaired: Follows one step commands with increased time     Cueing Cueing Techniques: Verbal cues, Visual cues, Tactile cues     General Comments General comments (skin integrity, edema, etc.): Pt confused and anxious to do a lot, needed a lot of cuing and reinforcement t/o the session to participate    Exercises     Assessment/Plan    PT Assessment Patient needs continued PT services  PT Problem List Decreased strength;Decreased range of motion;Decreased activity tolerance;Decreased balance;Decreased mobility;Pain;Decreased cognition;Decreased knowledge of use of DME;Decreased safety awareness;Decreased knowledge of precautions       PT Treatment Interventions DME instruction;Gait training;Stair training;Functional mobility training;Therapeutic exercise;Therapeutic activities;Balance training;Patient/family education    PT Goals (Current goals can be found in the Care Plan section)  Acute Rehab PT Goals Patient Stated Goal: none stated other than "to lay back down" PT Goal Formulation: Patient unable to participate in goal setting Time For Goal Achievement: 11/02/23 Potential to Achieve Goals: Fair    Frequency Min 1X/week     Co-evaluation   Reason for Co-Treatment: To address functional/ADL transfers;Other (comment) (pt tolerance)           AM-PAC PT "6 Clicks" Mobility  Outcome Measure Help needed turning from your back to your side while in a flat bed without using bedrails?: A Lot Help needed moving from lying on your back to sitting on the side of a flat bed without using bedrails?: A Lot Help needed moving to and from a bed to a chair (including  a wheelchair)?: Total Help needed standing up from a chair using your arms (e.g., wheelchair or bedside chair)?: Total Help needed to walk in hospital room?: Total Help needed climbing 3-5 steps with a railing? : Total 6 Click Score: 8    End of Session Equipment Utilized During Treatment: Gait belt Activity Tolerance: Patient limited by pain (anxious, confused) Patient left: with bed alarm set;with call bell/phone within reach Nurse Communication: Mobility status PT Visit Diagnosis: Muscle weakness (generalized) (M62.81);Difficulty in walking, not elsewhere classified (R26.2)    Time: 2952-8413 PT Time Calculation (min) (ACUTE ONLY): 24 min   Charges:   PT Evaluation $PT Eval Low Complexity: 1 Low   PT General Charges $$ ACUTE PT VISIT: 1 Visit         Malachi Pro, DPT 10/20/2023, 11:14 AM

## 2023-10-20 NOTE — Plan of Care (Signed)
Problem: Education: Goal: Knowledge of General Education information will improve Description: Including pain rating scale, medication(s)/side effects and non-pharmacologic comfort measures Outcome: Progressing   Problem: Nutrition: Goal: Adequate nutrition will be maintained Outcome: Progressing   Problem: Safety: Goal: Ability to remain free from injury will improve Outcome: Progressing

## 2023-10-20 NOTE — Progress Notes (Signed)
[facility] Room 141 [facility] Liaison Note   Barbara Frank is a current hospice patient with [facility], contracting with a terminal diagnosis of malignant neoplasm of kidney. Patient reports she experienced a fall at her facility when she caught her foot on the foot of the bed.Facility called hospice to report that patient had a large knot on her head, elevated BP and elevated HR and that EMS had been called. Patient was admitted to [facility] 2.11.2025 with a diagnosis of paroxysmal atrial fibrillation with rapid ventricular response, acute pulmonary embolism, multifocal pneumonia and closed fracture of proximal end of right humerus. Per Dr. Anne Fu with [facility] , this is a related hospital admission.   Visited with patient at bedside.  Patient sitting up in bed eating breakfast and watching TV.  Patient said she was good and had no needs at the moment.     Patient is GIP appropriate for medication management of HR, continued cardiac monitoring, pain evaluation from fracture, electrolyte replacement and treatment of suspected pneumonia with IV antibiotics.   VS: 98.6, 144/80, 108, RR 17, 94% on room air   I/O: not recorded/not recorded   Abnormal Labs: Sodium: 133 (L) Glucose: 219 (H)  Diagnostics:NO NEW DIAGNOSTICS 2.13.25 DG Shoulder Right IMPRESSION: *Mildly displaced fracture of the proximal right humerus.   CT Head/CT Cervical Spine IMPRESSION: 1. No evidence of acute intracranial or cervical spine injury. 2. Peripherally high-density left internal jugular vein suggesting thrombus. 3. 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   DG Elbow/Hand 2 views IMPRESSION: *No acute osseous abnormality of the right elbow or right hand.   IV/PRN meds: Cardizem 1 mg/ml infusion 200 mg loading dose, then continuous infusion @ 15 mg/hr, Rocephin 2 g X 1 and then 1 g Q 24hours IV  Assessment and plan as per Dr.  Cristine Polio note 2.13.25  * Paroxysmal atrial fibrillation with rapid ventricular response (HCC) Patient tapered off Cardizem drip on 10/19/2023.  Patient on increased dose of Coreg and Cardizem CD.  Patient on Xarelto for anticoagulation.   Acute pulmonary embolism [facility]) Oncology recommending changing Eliquis to Xarelto.  Lower extremity ultrasound negative for DVT.  Left internal jugular DVT   Multifocal pneumonia Continue on Rocephin and doxycycline   Renal cancer, right (HCC) Right renal mass enlarging in size.  Mets to lung and bone.  Followed by hospice.  Patient is a DNR.   Hypomagnesemia Replaced   Hyponatremia Continue to follow   Uncontrolled type 2 diabetes mellitus with hyperglycemia, with long-term current use of insulin (HCC) Hemoglobin A1c 11.1.  Patient on 23 units of Semglee insulin and sliding scale.  Holding metformin.  Sugars still elevated but patient not eating very well so hesitant on going up on the Semglee insulin.   Overweight (BMI 25.0-29.9) BMI 29.27   Lethargy Patient just received IV pain medications when I saw her so she was little lethargic.  Continue to monitor.  Change IV pain medications over to oral.   Closed fracture of proximal end of right humerus Sling.  Pain control changed to oral medications only since lethargic with IV meds.   Diarrhea Stool comprehensive panel negative and C. difficile negative.   Depression On Lexapro   Discharge Planning:  Discussed ordering a wheelchair for mobility at the facility ( house) and PT to help with transfers. Discussed this with OT that is working with patient in the hospital and TOC.   Will continue to assess for appropriate discharge disposition.  Family contact: Met with patient at the bedside.     IDT: updated   Goals of Care: clear, DNR.   Please call with any hospice related questions or concerns.   [facility] Liaison 336 [phone]

## 2023-10-21 ENCOUNTER — Encounter: Payer: Self-pay | Admitting: Oncology

## 2023-10-21 ENCOUNTER — Inpatient Hospital Stay: Payer: Medicare PPO

## 2023-10-21 DIAGNOSIS — J189 Pneumonia, unspecified organism: Secondary | ICD-10-CM | POA: Diagnosis not present

## 2023-10-21 DIAGNOSIS — C641 Malignant neoplasm of right kidney, except renal pelvis: Secondary | ICD-10-CM | POA: Diagnosis not present

## 2023-10-21 DIAGNOSIS — I2699 Other pulmonary embolism without acute cor pulmonale: Secondary | ICD-10-CM | POA: Diagnosis not present

## 2023-10-21 DIAGNOSIS — I480 Paroxysmal atrial fibrillation: Secondary | ICD-10-CM | POA: Diagnosis not present

## 2023-10-21 LAB — CBC
HCT: 35 % — ABNORMAL LOW (ref 36.0–46.0)
Hemoglobin: 10.9 g/dL — ABNORMAL LOW (ref 12.0–15.0)
MCH: 25.4 pg — ABNORMAL LOW (ref 26.0–34.0)
MCHC: 31.1 g/dL (ref 30.0–36.0)
MCV: 81.6 fL (ref 80.0–100.0)
Platelets: 350 10*3/uL (ref 150–400)
RBC: 4.29 MIL/uL (ref 3.87–5.11)
RDW: 14.6 % (ref 11.5–15.5)
WBC: 11.9 10*3/uL — ABNORMAL HIGH (ref 4.0–10.5)
nRBC: 0 % (ref 0.0–0.2)

## 2023-10-21 LAB — MYCOPLASMA PNEUMONIAE ANTIBODY, IGM: Mycoplasma pneumo IgM: <770 U/mL (ref 0–769)

## 2023-10-21 LAB — BASIC METABOLIC PANEL
Anion gap: 8 (ref 5–15)
BUN: 24 mg/dL — ABNORMAL HIGH (ref 8–23)
CO2: 23 mmol/L (ref 22–32)
Calcium: 9.8 mg/dL (ref 8.9–10.3)
Chloride: 104 mmol/L (ref 98–111)
Creatinine, Ser: 0.54 mg/dL (ref 0.44–1.00)
GFR, Estimated: >60 mL/min (ref >60)
Glucose, Bld: 214 mg/dL — ABNORMAL HIGH (ref 70–99)
Potassium: 3.6 mmol/L (ref 3.5–5.1)
Sodium: 135 mmol/L (ref 135–145)

## 2023-10-21 LAB — GLUCOSE, CAPILLARY
Glucose-Capillary: 221 mg/dL — ABNORMAL HIGH (ref 70–99)
Glucose-Capillary: 299 mg/dL — ABNORMAL HIGH (ref 70–99)
Glucose-Capillary: 261 mg/dL — ABNORMAL HIGH (ref 70–99)
Glucose-Capillary: 190 mg/dL — ABNORMAL HIGH (ref 70–99)

## 2023-10-21 MED ORDER — POLYETHYLENE GLYCOL 3350 17 G PO PACK
17.0000 g | PACK | Freq: Every day | ORAL | Status: DC
  Administered 2023-10-21 – 2023-10-27 (×7): 17 g via ORAL
  Filled 2023-10-21 (×7): qty 1

## 2023-10-21 MED ORDER — INSULIN GLARGINE-YFGN 100 UNIT/ML ~~LOC~~ SOLN
25.0000 [IU] | Freq: Every day | SUBCUTANEOUS | Status: DC
  Administered 2023-10-22: 25 [IU] via SUBCUTANEOUS
  Filled 2023-10-21: qty 0.25

## 2023-10-21 MED ORDER — ORAL CARE MOUTH RINSE: 15.0000 mL | OROMUCOSAL | Status: DC | PRN

## 2023-10-21 NOTE — Progress Notes (Signed)
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  Patient also has a history of paroxysmal atrial fibrillation on Eliquis, diabetes, hypertension presented with new onset cough and shortness of breath and a fall.  Symptoms started about a week ago with a dry cough.  She is also sore on her abdomen from all the coughing.  She has been taking laxatives but now has diarrhea.  CT scan of the chest showed a small pulmonary embolism, left internal jugular DVT, enlarging right renal mass and metastatic lesion to the rib with pneumonia versus metastasis in the chest.  Patient started on antibiotics.  Patient also started on Cardizem drip for rapid atrial fibrillation.  2/12.  Add oral Cardizem and oral Coreg to try to taper off Cardizem drip.  Continue antibiotics. 2/13.  Patient lethargic after pain medication. 2/14.  CT scan negative.  Assessment and Plan: * Paroxysmal atrial fibrillation with rapid ventricular response (HCC) Patient tapered off Cardizem drip on 10/19/2023.  Patient on increased dose of Coreg and Cardizem CD.  Patient on Xarelto for anticoagulation.  Heart rate stable.  Acute pulmonary embolism [facility]) Oncology recommending changing Eliquis to Xarelto.  Lower extremity ultrasound negative for DVT.  Left internal jugular DVT.  Multifocal pneumonia Continue on Rocephin and doxycycline for 5 days total  Renal cancer, right (HCC) Right renal mass enlarging in size.  Mets to lung and bone.  Followed by hospice.  Patient is a DNR.  Hypomagnesemia Replaced  Hyponatremia Last sodium normal range  Uncontrolled type 2 diabetes mellitus with hyperglycemia, with long-term current use of insulin (HCC) Hemoglobin  A1c 11.1.  Increase to 25 units of Semglee insulin and sliding scale.  Holding metformin.   Overweight (BMI 25.0-29.9) BMI 29.27  Lethargy Better today than yesterday but still slow with some of her answers and not straight leg raising.  CT scan of the head negative.  Closed fracture of proximal end of right humerus Sling.  Oral pain medications.  Not a candidate for surgery.  Diarrhea Stool comprehensive panel negative and C. difficile negative.  Depression On Lexapro        Subjective: Patient a little slower with her answers but does answer correctly.  Patient unable to straight leg raise but able to push down with her toes.  Physical Exam: Vitals:   10/20/23 1554 10/20/23 2329 10/21/23 0844 10/21/23 1545  BP: 134/67 128/72 (!) 148/90 (!) 119/55  Pulse: 94 88 95 76  Resp: 16 17 18 16   Temp: 97.8 F (36.6 C) 97.7 F (36.5 C) (!) 97.4 F (36.3 C) (!) 97.4 F (36.3 C)  TempSrc:      SpO2: 97% 96% 94% 100%  Weight:      Height:       Physical Exam HENT:     Head: Normocephalic.     Mouth/Throat:     Pharynx: No oropharyngeal exudate.  Eyes:     General: Lids are normal.     Conjunctiva/sclera: Conjunctivae normal.  Cardiovascular:     Rate and Rhythm: Normal rate. Rhythm irregularly irregular.     Heart sounds: Normal heart sounds, S1 normal and S2 normal.  Pulmonary:     Breath sounds: Examination of the right-lower field reveals decreased breath sounds. Examination of  the left-lower field reveals decreased breath sounds. Decreased breath sounds present. No wheezing, rhonchi or rales.  Abdominal:     Palpations: Abdomen is soft.     Tenderness: There is no abdominal tenderness.  Musculoskeletal:     Right lower leg: Swelling present.     Left lower leg: Swelling present.     Comments: Right arm in sling.  Skin:    General: Skin is warm.     Findings: No rash.  Neurological:     Mental Status: She is alert and oriented to person, place, and time.      Data Reviewed: Likewise, 11.9, hemoglobin 10.9, platelet count 350, creatinine 0.54  Family Communication: Left message for Raynelle Fanning but did speak with patient's friend at the bedside this morning permission to speak in front of friend from patient.  Disposition: Status is: Inpatient Remains inpatient appropriate because: Her facility will come evaluate her on Monday to see if they can take her back.  Planned Discharge Destination: Rehab versus assisted living with hospice    Time spent: 30 minutes One of the time was trying to figure out whether I can get her back to her facility.  Author: Alford Highland, MD 10/21/2023 3:49 PM  For on call review [email or web address].

## 2023-10-21 NOTE — Progress Notes (Signed)
[facility] Room 141 [facility] Liaison Note   Ms. Hird is a current hospice patient with [facility], contracting with a terminal diagnosis of malignant neoplasm of kidney. Patient reports she experienced a fall at her facility when she caught her foot on the foot of the bed.Facility called hospice to report that patient had a large knot on her head, elevated BP and elevated HR and that EMS had been called. Patient was admitted to [facility] 2.11.2025 with a diagnosis of paroxysmal atrial fibrillation with rapid ventricular response, acute pulmonary embolism, multifocal pneumonia and closed fracture of proximal end of right humerus. Per Dr. Anne Fu with [facility] , this is a related hospital admission.   Visited with patient at bedside and exchanged report with nurse. Patient remains lethargic today and a CT has been completed to evaluate for any acute changes. TOC is communicating with facility to determine if she will be able to return there. She remains appropriate for inpatient status with recent change in mental status and need for further testing and evaluation.    VS: 97.4/101/16    119/55   spO2 100% room air   I/O: 933/600   Abnormal Labs: BUN 24, Phos 2.4, Mag 1.5, Albumin 3.1, WBC 11.9, Hgb 10.9, Hct 35   Diagnostics:  CT HEAD WITHOUT CONTRAST  IMPRESSION: 1.  No evidence of an acute intracranial abnormality. 2. Right anterior scalp hematoma. 3. 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 4. Pansinusitis.    Electronically Signed   By: Jackey Loge D.O.   On: 10/21/2023 13:30   IV/PRN meds: Rocephin 1 gram IV 24H   Assessment as per Progress Note Dr. Hilton Sinclair 2.14   Paroxysmal atrial fibrillation with rapid ventricular response [facility]) Patient tapered off Cardizem drip on 10/19/2023.  Patient on increased dose of Coreg and Cardizem CD.  Patient on Xarelto for anticoagulation.  Heart rate stable.   Acute pulmonary embolism [facility]) Oncology recommending  changing Eliquis to Xarelto.  Lower extremity ultrasound negative for DVT.  Left internal jugular DVT.   Multifocal pneumonia Continue on Rocephin and doxycycline for 5 days total   Renal cancer, right (HCC) Right renal mass enlarging in size.  Mets to lung and bone.  Followed by hospice.  Patient is a DNR.  Lethargy Better today than yesterday but still slow with some of her answers and not straight leg raising.  CT scan of the head negative.   Closed fracture of proximal end of right humerus Sling.  Oral pain medications.  Not a candidate for surgery.    Discharge Planning:  Ongoing, patient requiring more testing related to recent mental status change, facility to come evaluate Monday for appropriateness to return.    Family contact: None today     IDT: updated   Goals of Care: clear, DNR.   Please call with any hospice related questions or concerns.   Thea Gist, BSN, [facility] Liaison [phone]

## 2023-10-21 NOTE — Inpatient Diabetes Management (Signed)
Inpatient Diabetes Program Recommendations  AACE/ADA: New Consensus Statement on Inpatient Glycemic Control (2015)  Target Ranges:  Prepandial:   less than 140 mg/dL      Peak postprandial:   less than 180 mg/dL (1-2 hours)      Critically ill patients:  140 - 180 mg/dL    Latest Reference Range & Units 10/20/23 07:59 10/20/23 11:55 10/20/23 13:58 10/20/23 17:11 10/20/23 21:41  Glucose-Capillary 70 - 99 mg/dL 161 (H)  5 units Novolog  241 (H)   23 units Semglee 222 (H)  5 units Novolog  190 (H)  3 units Novolog  229 (H)  2 units Novolog   (H): Data is abnormally high  Latest Reference Range & Units 10/21/23 07:59  Glucose-Capillary 70 - 99 mg/dL 096 (H)  (H): Data is abnormally high     Home DM Meds: Semglee 23 units daily     Metformin XR 1000 mg daily   Current Orders: Semglee 23 units Daily      Novolog Moderate Correction Scale/ SSI (0-15 units) TID AC + HS    MD- Please consider:  1. Increase Semglee to 25 units Daily  2. If pt continues with poor PO intake, may consider increasing the frequency of the Novolog SSI to Q4 hours    --Will follow patient during hospitalization--  Ambrose Finland RN, MSN, CDCES Diabetes Coordinator Inpatient Glycemic Control Team Team Pager: [phone] (8a-5p)

## 2023-10-21 NOTE — TOC Progression Note (Signed)
Transition of Care [facility]) - Progression Note    Patient Details  Name: Barbara Frank MRN: [id number] Date of Birth: 10-10-1945  Transition of Care [facility]) CM/SW Contact  Truddie Hidden, RN Phone Number: 10/21/2023, 2:49 PM  Clinical Narrative:    Ginger Carne from [facility] regarding discharge today patient admissions back to [facility] has to be approved by facility.    Spoke with Maralyn Sago, Production designer, theatre/television/film  from [employer] regarding discharge back to facility today. Facility will come to do an assessment early this coming Monday, 2/17 to determine if they will be able to manage patient's care upon her return. MD notified.          Expected Discharge Plan and Services                                               Social Determinants of Health (SDOH) Interventions SDOH Screenings   Food Insecurity: Patient Unable To Answer (10/19/2023)  Housing: Patient Unable To Answer (10/19/2023)  Transportation Needs: Patient Unable To Answer (10/19/2023)  Utilities: Patient Unable To Answer (10/19/2023)  Social Connections: Patient Unable To Answer (10/19/2023)  Tobacco Use: Low Risk  (10/19/2023)    Readmission Risk Interventions     No data to display

## 2023-10-21 NOTE — Care Management Important Message (Signed)
Important Message  Patient Details  Name: Barbara Frank MRN: [id number] Date of Birth: Sep 29, 1945   Important Message Given:  Yes - Medicare IM     Cristela Blue, CMA 10/21/2023, 10:34 AM

## 2023-10-21 NOTE — Plan of Care (Signed)
Problem: Education: Goal: Knowledge of General Education information will improve Description: Including pain rating scale, medication(s)/side effects and non-pharmacologic comfort measures Outcome: Progressing   Problem: Safety: Goal: Ability to remain free from injury will improve Outcome: Progressing

## 2023-10-22 DIAGNOSIS — C641 Malignant neoplasm of right kidney, except renal pelvis: Secondary | ICD-10-CM | POA: Diagnosis not present

## 2023-10-22 DIAGNOSIS — I480 Paroxysmal atrial fibrillation: Secondary | ICD-10-CM | POA: Diagnosis not present

## 2023-10-22 DIAGNOSIS — J189 Pneumonia, unspecified organism: Secondary | ICD-10-CM | POA: Diagnosis not present

## 2023-10-22 DIAGNOSIS — I2699 Other pulmonary embolism without acute cor pulmonale: Secondary | ICD-10-CM | POA: Diagnosis not present

## 2023-10-22 LAB — GLUCOSE, CAPILLARY
Glucose-Capillary: 185 mg/dL — ABNORMAL HIGH (ref 70–99)
Glucose-Capillary: 199 mg/dL — ABNORMAL HIGH (ref 70–99)
Glucose-Capillary: 200 mg/dL — ABNORMAL HIGH (ref 70–99)
Glucose-Capillary: 193 mg/dL — ABNORMAL HIGH (ref 70–99)

## 2023-10-22 MED ORDER — DILTIAZEM HCL ER COATED BEADS 300 MG PO CP24
300.0000 mg | ORAL_CAPSULE | Freq: Every evening | ORAL | Status: DC
  Filled 2023-10-22: qty 1

## 2023-10-22 MED ORDER — INSULIN GLARGINE-YFGN 100 UNIT/ML ~~LOC~~ SOLN
27.0000 [IU] | Freq: Every day | SUBCUTANEOUS | Status: DC
  Administered 2023-10-23: 27 [IU] via SUBCUTANEOUS
  Filled 2023-10-22 (×2): qty 0.27

## 2023-10-22 MED ORDER — OXYCODONE HCL 5 MG PO TABS
2.5000 mg | ORAL_TABLET | Freq: Four times a day (QID) | ORAL | Status: DC | PRN
  Administered 2023-10-24 – 2023-10-25 (×2): 2.5 mg via ORAL
  Filled 2023-10-22 (×2): qty 1

## 2023-10-22 MED ORDER — QUETIAPINE FUMARATE 25 MG PO TABS
12.5000 mg | ORAL_TABLET | Freq: Every evening | ORAL | Status: DC | PRN
  Administered 2023-10-24 – 2023-10-26 (×2): 12.5 mg via ORAL
  Filled 2023-10-22 (×2): qty 1

## 2023-10-22 MED ORDER — MORPHINE SULFATE (PF) 2 MG/ML IV SOLN
1.0000 mg | Freq: Once | INTRAVENOUS | Status: AC
  Administered 2023-10-22: 1 mg via INTRAVENOUS
  Filled 2023-10-22: qty 1

## 2023-10-22 MED ORDER — DILTIAZEM HCL ER COATED BEADS 240 MG PO CP24
240.0000 mg | ORAL_CAPSULE | Freq: Every evening | ORAL | Status: DC
  Administered 2023-10-22 – 2023-10-26 (×5): 240 mg via ORAL
  Filled 2023-10-22 (×5): qty 1

## 2023-10-22 NOTE — Progress Notes (Signed)
On call provider was sent the following message "She is really agitated. And her blood pressure is 195/97. Assessment is hard as she is confused. She does not have any PRN medication for her BP. " In message t was discussed that pt was unable to verbalize her pain or source of agitation due to her poor mentation.  1 mg morphine was given.

## 2023-10-22 NOTE — Progress Notes (Signed)
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  Patient also has a history of paroxysmal atrial fibrillation on Eliquis, diabetes, hypertension presented with new onset cough and shortness of breath and a fall.  Symptoms started about a week ago with a dry cough.  She is also sore on her abdomen from all the coughing.  She has been taking laxatives but now has diarrhea.  CT scan of the chest showed a small pulmonary embolism, left internal jugular DVT, enlarging right renal mass and metastatic lesion to the rib with pneumonia versus metastasis in the chest.  Patient started on antibiotics.  Patient also started on Cardizem drip for rapid atrial fibrillation.  2/12.  Add oral Cardizem and oral Coreg to try to taper off Cardizem drip.  Continue antibiotics. 2/13.  Patient lethargic after pain medication. 2/14.  CT scan negative. 2/15.  Patient did not sleep very well last night.  Will order as needed Seroquel if does not sleep tonight.  Assessment and Plan: * Paroxysmal atrial fibrillation with rapid ventricular response (HCC) Patient tapered off Cardizem drip on 10/19/2023.  Patient on increased dose of Coreg and Cardizem CD.  Patient on Xarelto for anticoagulation.  Heart rate stable.  Acute pulmonary embolism [facility]) Oncology recommending changing Eliquis to Xarelto.  Lower extremity ultrasound negative for DVT.  Left internal jugular DVT.  Multifocal pneumonia Continue on Rocephin and doxycycline for 5 days total  Renal cancer, right (HCC) Right renal mass enlarging in size.  Mets to lung and bone.  Followed by hospice.  Patient is a DNR.  Hypomagnesemia Replaced  Hyponatremia Last sodium normal  range  Uncontrolled type 2 diabetes mellitus with hyperglycemia, with long-term current use of insulin (HCC) Hemoglobin A1c 11.1.  Increase to 27 units of Semglee insulin for tomorrow and sliding scale.  Holding metformin.   Overweight (BMI 25.0-29.9) BMI 29.27  Lethargy Patient did not sleep very well last night.  Answer some questions.  Does not feel hungry this morning.  Ate 1 bite of egg while I was in the room.  Will give Seroquel at night as needed if she does not sleep.  Closed fracture of proximal end of right humerus Sling.  Oral pain medications.  Not a candidate for surgery.  Diarrhea Stool comprehensive panel negative and C. difficile negative.  Depression On Lexapro        Subjective: Patient was restless last night and did not sleep much.  She answers a few questions but slow with her answers.  Has a cough.  Physical Exam: Vitals:   10/21/23 1545 10/22/23 0134 10/22/23 1006 10/22/23 1007  BP: (!) 119/55 (!) 195/97 126/70 126/70  Pulse: 76 95 85 79  Resp: 16 17 17 17   Temp: (!) 97.4 F (36.3 C)   98 F (36.7 C)  TempSrc:  Oral    SpO2: 100% 94% 93% 92%  Weight:      Height:       Physical Exam HENT:     Head: Normocephalic.     Mouth/Throat:     Pharynx: No oropharyngeal exudate.  Eyes:     General: Lids are normal.     Conjunctiva/sclera: Conjunctivae normal.  Cardiovascular:     Rate and Rhythm: Normal rate. Rhythm  irregularly irregular.     Heart sounds: Normal heart sounds, S1 normal and S2 normal.  Pulmonary:     Breath sounds: Examination of the right-lower field reveals decreased breath sounds. Examination of the left-lower field reveals decreased breath sounds. Decreased breath sounds present. No wheezing, rhonchi or rales.  Abdominal:     Palpations: Abdomen is soft.     Tenderness: There is no abdominal tenderness.  Musculoskeletal:     Right lower leg: Swelling present.     Left lower leg: Swelling present.     Comments: Right arm in  sling.  Skin:    General: Skin is warm.     Findings: No rash.     Comments: Bruise right shoulder and right forehead.  Neurological:     Mental Status: She is alert and oriented to person, place, and time.     Data Reviewed: White blood cell count 11.9, hemoglobin 10.9, platelet count 350.  Creatinine 0.54, sodium 135, BUN 24 Family Communication: Spoke with POA on the phone  Disposition: Status is: Inpatient Remains inpatient appropriate because: Assisted living to evaluate on Monday and with that they can take back with hospice following.  Planned Discharge Destination: Potentially back to assisted living with hospice following.    Time spent: 28 minutes  Author: Alford Highland, MD 10/22/2023 12:01 PM  For on call review [email or web address].

## 2023-10-22 NOTE — Progress Notes (Signed)
[facility] Room 141 [facility] Liaison Note   Ms. Darrow is a current hospice patient with Civil engineer, contracting with a terminal diagnosis of malignant neoplasm of kidney. Patient reports she experienced a fall at her facility when she caught her foot on the foot of the bed.Facility called hospice to report that patient had a large knot on her head, elevated BP and elevated HR and that EMS had been called. Patient was admitted to [facility] 2.11.2025 with a diagnosis of paroxysmal atrial fibrillation with rapid ventricular response, acute pulmonary embolism, multifocal pneumonia and closed fracture of proximal end of right humerus. Per Dr. Anne Fu with [facility] , this is a related hospital admission.   Visited with patient at bedside.  Patient sitting up in bed watching TV.  Patient seemed a more confused today.  She did not seem to be in any distress during the visit.  No family present during visit.  VS: 98,  126/70,  79,  17, spO2 92% room air   I/O: 220/200   Abnormal Labs: Glucose 185   Diagnostics:   CT HEAD WITHOUT CONTRAST  IMPRESSION: 1.  No evidence of an acute intracranial abnormality. 2. Right anterior scalp hematoma. 3. 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 4. Pansinusitis.    Electronically Signed   By: Jackey Loge D.O.   On: 10/21/2023 13:30 NO NEW DIAGNOSITICS FOR 2.15.25   IV/PRN meds: Rocephin 1 gram IV 24H   Assessment as per Progress Note Dr. Renae Gloss 2.15.25   Paroxysmal atrial fibrillation with rapid ventricular response [facility]) Patient tapered off Cardizem drip on 10/19/2023.  Patient on increased dose of Coreg and Cardizem CD.  Patient on Xarelto for anticoagulation.  Heart rate stable.   Acute pulmonary embolism [facility]) Oncology recommending changing Eliquis to Xarelto.  Lower extremity ultrasound negative for DVT.  Left internal jugular DVT.   Multifocal pneumonia Continue on Rocephin and doxycycline for 5 days total    Renal cancer, right (HCC) Right renal mass enlarging in size.  Mets to lung and bone.  Followed by hospice.  Patient is a DNR.   Hypomagnesemia Replaced   Hyponatremia Last sodium normal range   Uncontrolled type 2 diabetes mellitus with hyperglycemia, with long-term current use of insulin (HCC) Hemoglobin A1c 11.1.  Increase to 27 units of Semglee insulin for tomorrow and sliding scale.  Holding metformin.    Overweight (BMI 25.0-29.9) BMI 29.27   Lethargy Patient did not sleep very well last night.  Answer some questions.  Does not feel hungry this morning.  Ate 1 bite of egg while I was in the room.  Will give Seroquel at night as needed if she does not sleep.   Closed fracture of proximal end of right humerus Sling.  Oral pain medications.  Not a candidate for surgery.   Diarrhea Stool comprehensive panel negative and C. difficile negative.   Depression On Lexapro     Discharge Planning: RN reported  that patient was "really agitated" early this am with elevated BP.  [facility] to evaluate patient on Monday to see if patient can return.  May need PT and a wheelchair.     Family contact: Visited patient at bedside.  No family present.     IDT: updated   Goals of Care: clear, DNR.   Please call with any hospice related questions or concerns.   [facility] Liaison [phone]

## 2023-10-22 NOTE — Progress Notes (Signed)
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  Patient also has a history of paroxysmal atrial fibrillation on Eliquis, diabetes, hypertension    PT Comments  Pt in bed.  Assisted to sitting EOB with mod/max a x 1.  She initially needs min a x 1 to remains upright but as session progresses she is able to sit unsupported with light verbal cues to correct balance.  She sat for 10 minutes today until fatigued.  She participates in minimal seated AROM and EUE reaching activities but will perform a few but then stop despite cues.  Sling is adjusted for improved support.  She does wash face when given a washcloth.  Returned to supine with max a x 1 and repositioned in bed with max a x 2 and left in upright position for lunch.   If plan is discharge home, recommend the following: Two people to help with walking and/or transfers;Two people to help with bathing/dressing/bathroom;Assistance with cooking/housework;Assistance with feeding;Direct supervision/assist for medications management;Direct supervision/assist for financial management;Assist for transportation;Supervision due to cognitive status   Can travel by private vehicle        Equipment Recommendations       Recommendations for Other Services       Precautions / Restrictions Precautions Precautions: Fall Required Braces or Orthoses: Sling Restrictions Weight Bearing Restrictions Per Provider Order: No (assumed NWBing due to fracture)     Mobility  Bed Mobility Overal bed mobility: Needs Assistance Bed  Mobility: Supine to Sit, Sit to Supine     Supine to sit: Max assist, Mod assist Sit to supine: Mod assist, Max assist     Patient Response: Cooperative, Flat affect  Transfers                   General transfer comment: deferred as +2 not available at the time    Ambulation/Gait                   Stairs             Wheelchair Mobility     Tilt Bed Tilt Bed Patient Response: Cooperative, Flat affect  Modified Rankin (Stroke Patients Only)       Balance Overall balance assessment: Needs assistance Sitting-balance support: Feet supported Sitting balance-Leahy Scale: Fair Sitting balance - Comments: initially light +! assist but does progress to supervision with time                                    Communication Communication Communication: Impaired Factors Affecting Communication: Difficulty expressing self  Cognition Arousal: Alert Behavior During Therapy: WFL for tasks assessed/performed, Flat affect                           PT - Cognition Comments: limited verbalizations during session. Following commands: Impaired Following commands impaired: Follows one step commands with increased time    Cueing Cueing Techniques: Verbal cues, Visual cues, Tactile cues  Exercises Other Exercises Other Exercises: seated AROM with cues to remain  on task and keep balance.  some reaching LUE but would reach a couple times then stop    General Comments        Pertinent Vitals/Pain Pain Assessment Pain Assessment: Faces Faces Pain Scale: Hurts even more Pain Location: hyper responsive to any movement of the R UE, painful adjustment of sling to reposition as it is not supporting shoulder in sitting. Pain Descriptors / Indicators: Sore, Guarding, Grimacing Pain Intervention(s): Limited activity within patient's tolerance, Repositioned, Monitored during session    Home Living                          Prior  Function            PT Goals (current goals can now be found in the care plan section) Progress towards PT goals: Progressing toward goals    Frequency    Min 1X/week      PT Plan      Co-evaluation              AM-PAC PT "6 Clicks" Mobility   Outcome Measure  Help needed turning from your back to your side while in a flat bed without using bedrails?: A Lot Help needed moving from lying on your back to sitting on the side of a flat bed without using bedrails?: A Lot Help needed moving to and from a bed to a chair (including a wheelchair)?: Total Help needed standing up from a chair using your arms (e.g., wheelchair or bedside chair)?: Total Help needed to walk in hospital room?: Total Help needed climbing 3-5 steps with a railing? : Total 6 Click Score: 8    End of Session   Activity Tolerance: Patient limited by pain;Patient limited by fatigue Patient left: with bed alarm set;with call bell/phone within reach;in bed Nurse Communication: Mobility status PT Visit Diagnosis: Muscle weakness (generalized) (M62.81);Difficulty in walking, not elsewhere classified (R26.2)     Time: 0981-1914 PT Time Calculation (min) (ACUTE ONLY): 12 min  Charges:    $Therapeutic Activity: 8-22 mins PT General Charges $$ ACUTE PT VISIT: 1 Visit                    Danielle Dess, PTA 10/22/23, 12:55 PM

## 2023-10-22 NOTE — Plan of Care (Signed)
  Problem: Education: Goal: Ability to describe self-care measures that may prevent or decrease complications (Diabetes Survival Skills Education) will improve Outcome: Not Progressing   Problem: Coping: Goal: Ability to adjust to condition or change in health will improve Outcome: Not Progressing   Problem: Fluid Volume: Goal: Ability to maintain a balanced intake and output will improve Outcome: Not Progressing   Problem: Health Behavior/Discharge Planning: Goal: Ability to identify and utilize available resources and services will improve Outcome: Not Progressing   Problem: Nutritional: Goal: Maintenance of adequate nutrition will improve Outcome: Not Progressing

## 2023-10-23 DIAGNOSIS — I2699 Other pulmonary embolism without acute cor pulmonale: Secondary | ICD-10-CM | POA: Diagnosis not present

## 2023-10-23 DIAGNOSIS — S42294A Other nondisplaced fracture of upper end of right humerus, initial encounter for closed fracture: Secondary | ICD-10-CM

## 2023-10-23 DIAGNOSIS — I480 Paroxysmal atrial fibrillation: Secondary | ICD-10-CM | POA: Diagnosis not present

## 2023-10-23 DIAGNOSIS — J189 Pneumonia, unspecified organism: Secondary | ICD-10-CM | POA: Diagnosis not present

## 2023-10-23 LAB — GLUCOSE, CAPILLARY
Glucose-Capillary: 170 mg/dL — ABNORMAL HIGH (ref 70–99)
Glucose-Capillary: 147 mg/dL — ABNORMAL HIGH (ref 70–99)
Glucose-Capillary: 107 mg/dL — ABNORMAL HIGH (ref 70–99)
Glucose-Capillary: 113 mg/dL — ABNORMAL HIGH (ref 70–99)

## 2023-10-23 LAB — CULTURE, BLOOD (SINGLE): Culture: NO GROWTH

## 2023-10-23 MED ORDER — LEVALBUTEROL HCL 0.63 MG/3ML IN NEBU
0.6300 mg | INHALATION_SOLUTION | Freq: Three times a day (TID) | RESPIRATORY_TRACT | Status: DC
  Administered 2023-10-23 – 2023-10-26 (×9): 0.63 mg via RESPIRATORY_TRACT
  Filled 2023-10-23 (×11): qty 3

## 2023-10-23 NOTE — Progress Notes (Signed)
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  Patient also has a history of paroxysmal atrial fibrillation on Eliquis, diabetes, hypertension presented with new onset cough and shortness of breath and a fall.  Symptoms started about a week ago with a dry cough.  She is also sore on her abdomen from all the coughing.  She has been taking laxatives but now has diarrhea.  CT scan of the chest showed a small pulmonary embolism, left internal jugular DVT, enlarging right renal mass and metastatic lesion to the rib with pneumonia versus metastasis in the chest.  Patient started on antibiotics.  Patient also started on Cardizem drip for rapid atrial fibrillation.  2/12.  Add oral Cardizem and oral Coreg to try to taper off Cardizem drip.  Continue antibiotics. 2/13.  Patient lethargic after pain medication. 2/14.  CT scan negative. 2/15.  Patient did not sleep very well last night.  Will order as needed Seroquel if does not sleep tonight. 2/16.  Slept better last night.  Not having any pain.  Able to straight leg raise today.  Mental status improved.  Assessment and Plan: * Paroxysmal atrial fibrillation with rapid ventricular response (HCC) Patient tapered off Cardizem drip on 10/19/2023.  Patient on increased dose of Coreg and Cardizem CD.  Patient on Xarelto for anticoagulation.  Heart rate stable.  Acute pulmonary embolism [facility]) Oncology recommending changing Eliquis to Xarelto.  Lower extremity ultrasound negative for DVT.  Left internal jugular DVT.  Multifocal pneumonia Continue on Rocephin and doxycycline for 5 days total.  More rhonchi heard in the lungs today will give standing dose nebulizers and reassess  tomorrow.  Renal cancer, right (HCC) Right renal mass enlarging in size.  Mets to lung and bone.  Followed by hospice.  Patient is a DNR.  Hypomagnesemia Replaced  Hyponatremia Last sodium normal range  Uncontrolled type 2 diabetes mellitus with hyperglycemia, with long-term current use of insulin (HCC) Hemoglobin A1c 11.1.  Increased to 27 units of Semglee insulin for tomorrow and sliding scale.  Holding metformin.   Overweight (BMI 25.0-29.9) BMI 29.27  Lethargy Mental status a lot better than the last couple days.  Try to use Tylenol as needed for pain.  CT head did not show any acute intracranial abnormality.  Slept better last night.  Closed fracture of proximal end of right humerus Sling.  Oral pain medications only.  Not a candidate for surgery.  Diarrhea Stool comprehensive panel negative and C. difficile negative.  Depression On Lexapro        Subjective: Patient more awake today.  Answers questions a little bit quicker than the last few days.  Able to straight leg raise.  Admitted with pneumonia and right shoulder fracture.  Assisted living facility will come tomorrow to see if they can take her back with hospice.  Physical Exam: Vitals:   10/22/23 1007 10/22/23 1726 10/23/23 0056 10/23/23 0742  BP: 126/70 136/65 131/66 (!) 144/82  Pulse: 79 89 71 83  Resp: 17 17 16 16   Temp: 98 F (36.7 C) 98 F (36.7 C) 97.6 F (36.4 C) (!) 97.5 F (36.4 C)  TempSrc:      SpO2: 92% 96% 98% 97%  Weight:      Height:  Physical Exam HENT:     Head: Normocephalic.     Mouth/Throat:     Pharynx: No oropharyngeal exudate.  Eyes:     General: Lids are normal.     Conjunctiva/sclera: Conjunctivae normal.  Cardiovascular:     Rate and Rhythm: Normal rate. Rhythm irregularly irregular.     Heart sounds: Normal heart sounds, S1 normal and S2 normal.  Pulmonary:     Breath sounds: Examination of the right-middle field reveals rhonchi. Examination of the left-middle  field reveals rhonchi. Examination of the right-lower field reveals decreased breath sounds and rhonchi. Examination of the left-lower field reveals decreased breath sounds and rhonchi. Decreased breath sounds and rhonchi present. No wheezing or rales.  Abdominal:     Palpations: Abdomen is soft.     Tenderness: There is no abdominal tenderness.  Musculoskeletal:     Right lower leg: Swelling present.     Left lower leg: Swelling present.     Comments: Right arm in sling.  Skin:    General: Skin is warm.     Findings: No rash.     Comments: Bruise right shoulder and right forehead.  Neurological:     Mental Status: She is alert and oriented to person, place, and time.     Data Reviewed: Last creatinine 0.54, last hemoglobin 10.9, last white blood cell count 11.9 last platelet count 350  Family Communication: Spoke with POA at the bedside  Disposition: Status is: Inpatient Remains inpatient appropriate because: Her assisted living facility will evaluate on Monday.  Planned Discharge Destination: Hopefully back to her assisted living facility with hospice    Time spent: 28 minutes  Author: Alford Highland, MD 10/23/2023 3:08 PM  For on call review [email or web address].

## 2023-10-23 NOTE — Plan of Care (Signed)

## 2023-10-23 NOTE — Plan of Care (Signed)
  Problem: Education: Goal: Ability to describe self-care measures that may prevent or decrease complications (Diabetes Survival Skills Education) will improve Outcome: Not Progressing Goal: Individualized Educational Video(s) Outcome: Progressing   Problem: Coping: Goal: Ability to adjust to condition or change in health will improve Outcome: Not Progressing   Problem: Fluid Volume: Goal: Ability to maintain a balanced intake and output will improve Outcome: Not Progressing   Problem: Health Behavior/Discharge Planning: Goal: Ability to identify and utilize available resources and services will improve Outcome: Not Progressing Goal: Ability to manage health-related needs will improve Outcome: Progressing   Problem: Metabolic: Goal: Ability to maintain appropriate glucose levels will improve Outcome: Progressing   Problem: Nutritional: Goal: Maintenance of adequate nutrition will improve Outcome: Not Progressing Goal: Progress toward achieving an optimal weight will improve Outcome: Progressing   Problem: Skin Integrity: Goal: Risk for impaired skin integrity will decrease Outcome: Progressing   Problem: Tissue Perfusion: Goal: Adequacy of tissue perfusion will improve Outcome: Progressing   Problem: Education: Goal: Knowledge of General Education information will improve Description: Including pain rating scale, medication(s)/side effects and non-pharmacologic comfort measures Outcome: Progressing   Problem: Health Behavior/Discharge Planning: Goal: Ability to manage health-related needs will improve Outcome: Progressing   Problem: Clinical Measurements: Goal: Ability to maintain clinical measurements within normal limits will improve Outcome: Progressing Goal: Will remain free from infection Outcome: Progressing Goal: Diagnostic test results will improve Outcome: Progressing Goal: Respiratory complications will improve Outcome: Progressing Goal:  Cardiovascular complication will be avoided Outcome: Progressing   Problem: Activity: Goal: Risk for activity intolerance will decrease Outcome: Progressing   Problem: Nutrition: Goal: Adequate nutrition will be maintained Outcome: Progressing   Problem: Coping: Goal: Level of anxiety will decrease Outcome: Progressing   Problem: Elimination: Goal: Will not experience complications related to bowel motility Outcome: Progressing Goal: Will not experience complications related to urinary retention Outcome: Progressing   Problem: Pain Managment: Goal: General experience of comfort will improve and/or be controlled Outcome: Progressing   Problem: Safety: Goal: Ability to remain free from injury will improve Outcome: Progressing   Problem: Skin Integrity: Goal: Risk for impaired skin integrity will decrease Outcome: Progressing

## 2023-10-23 NOTE — Progress Notes (Signed)
[facility] Liaison Note  [name] is a current hospice patient with [facility] with a terminal diagnosis of malignant neoplasm of kidney. Patient reports she experienced a fall at her facility when she caught her foot on the foot of the bed.Facility called hospice to report that patient had a large knot on her head, elevated BP and elevated HR and that EMS had been called. Patient was admitted to [facility] 2.11.2025 with a diagnosis of paroxysmal atrial fibrillation with rapid ventricular response, acute pulmonary embolism, multifocal pneumonia and closed fracture of proximal end of right humerus. Per Dr. Anne Fu with [facility] , this is a related hospital admission.   Patient more awake today. Scheduled Tylenol for pain relief of humerus fracture. Remains on IV antibiotics for treatment of multifocal pneumonia.   Patient remains GIP appropriate for IV antibiotic treatment of pneumonia and continued assessment of pain and mental status.  V/S: 97.7, 131/69, 90, 16, 97% room air  I/O: 480/not recorded  Abnormal labs: No new lab work  Diagnostics: No new diagnostics  IV/PRN labs: Rocephin 1 g IV Q 24 hours  Assessment and Plan: per Dr. Renae Gloss' s note 2.16.2025  * Paroxysmal atrial fibrillation with rapid ventricular response (HCC) Patient tapered off Cardizem drip on 10/19/2023.  Patient on increased dose of Coreg and Cardizem CD.  Patient on Xarelto for anticoagulation.  Heart rate stable.   Acute pulmonary embolism [facility]) Oncology recommending changing Eliquis to Xarelto.  Lower extremity ultrasound negative for DVT.  Left internal jugular DVT.   Multifocal pneumonia Continue on Rocephin and doxycycline for 5 days total.  More rhonchi heard in the lungs today will give standing dose nebulizers and reassess tomorrow.   Renal cancer, right (HCC) Right renal mass enlarging in size.  Mets to lung and bone.  Followed by hospice.   Patient is a DNR.   Hypomagnesemia Replaced   Hyponatremia Last sodium normal range   Uncontrolled type 2 diabetes mellitus with hyperglycemia, with long-term current use of insulin (HCC) Hemoglobin A1c 11.1.  Increased to 27 units of Semglee insulin for tomorrow and sliding scale.  Holding metformin.    Overweight (BMI 25.0-29.9) BMI 29.27   Lethargy Mental status a lot better than the last couple days.  Try to use Tylenol as needed for pain.  CT head did not show any acute intracranial abnormality.  Slept better last night.   Closed fracture of proximal end of right humerus Sling.  Oral pain medications only.  Not a candidate for surgery.   Diarrhea Stool comprehensive panel negative and C. difficile negative.   Depression On Lexapro  Discharge Planning: [facility] to evaluate tomorrow if able to accept patient back with the support of hospice services.  Family contact: none today  IDT: updated  Goals of Care: clear, DNR, treat what can be treated, return to ALF with hospice.  Please call with any hospice related questions or concerns.  Thank you, Haynes Bast, BSN, [facility] Liaison [phone]

## 2023-10-24 ENCOUNTER — Encounter: Payer: Self-pay | Admitting: Oncology

## 2023-10-24 ENCOUNTER — Encounter: Payer: Self-pay | Admitting: Internal Medicine

## 2023-10-24 DIAGNOSIS — I480 Paroxysmal atrial fibrillation: Secondary | ICD-10-CM | POA: Diagnosis not present

## 2023-10-24 DIAGNOSIS — I2699 Other pulmonary embolism without acute cor pulmonale: Secondary | ICD-10-CM | POA: Diagnosis not present

## 2023-10-24 DIAGNOSIS — R5383 Other fatigue: Secondary | ICD-10-CM | POA: Diagnosis not present

## 2023-10-24 LAB — GLUCOSE, CAPILLARY
Glucose-Capillary: 139 mg/dL — ABNORMAL HIGH (ref 70–99)
Glucose-Capillary: 168 mg/dL — ABNORMAL HIGH (ref 70–99)
Glucose-Capillary: 102 mg/dL — ABNORMAL HIGH (ref 70–99)
Glucose-Capillary: 148 mg/dL — ABNORMAL HIGH (ref 70–99)

## 2023-10-24 MED ORDER — INSULIN GLARGINE-YFGN 100 UNIT/ML ~~LOC~~ SOLN
23.0000 [IU] | Freq: Every day | SUBCUTANEOUS | Status: DC
  Administered 2023-10-24 – 2023-10-27 (×4): 23 [IU] via SUBCUTANEOUS
  Filled 2023-10-24 (×4): qty 0.23

## 2023-10-24 NOTE — Plan of Care (Signed)
  Problem: Clinical Measurements: Goal: Ability to maintain clinical measurements within normal limits will improve Outcome: Progressing Goal: Will remain free from infection Outcome: Progressing Goal: Respiratory complications will improve Outcome: Progressing   Problem: Elimination: Goal: Will not experience complications related to urinary retention Outcome: Progressing   Problem: Health Behavior/Discharge Planning: Goal: Ability to manage health-related needs will improve Outcome: Not Progressing   Problem: Education: Goal: Knowledge of General Education information will improve Description: Including pain rating scale, medication(s)/side effects and non-pharmacologic comfort measures Outcome: Not Progressing   Problem: Clinical Measurements: Goal: Cardiovascular complication will be avoided Outcome: Not Progressing   Problem: Activity: Goal: Risk for activity intolerance will decrease Outcome: Not Progressing   Problem: Pain Managment: Goal: General experience of comfort will improve and/or be controlled Outcome: Not Progressing

## 2023-10-24 NOTE — [place] FL2 (Signed)
[place] MEDICAID FL2 LEVEL OF CARE FORM     IDENTIFICATION  Patient Name: Barbara Frank Birthdate: 22-May-1946 Sex: female Admission Date (Current Location): 10/18/2023  [facility] and [place] Number:  Chiropodist and Address:  [facility], [address] [place], [place] [address]      Provider Number: [id number]  Attending Physician Name and Address:  Alford Highland, MD  Relative Name and Phone Number:  Cydney Ok  Relative  Emergency Contact  [phone]    Current Level of Care: Hospital Recommended Level of Care: Assisted Living Facility Prior Approval Number:    Date Approved/Denied:   PASRR Number: [id number]  Discharge Plan: SNF    Current Diagnoses: Patient Active Problem List   Diagnosis Date Noted   Lethargy 10/20/2023   Multifocal pneumonia 10/19/2023   Hypomagnesemia 10/19/2023   Hyponatremia 10/19/2023   Uncontrolled type 2 diabetes mellitus with hyperglycemia, with long-term current use of insulin (HCC) 10/19/2023   Overweight (BMI 25.0-29.9) 10/19/2023   Diarrhea 10/19/2023   Closed fracture of proximal end of right humerus 10/19/2023   Rapid atrial fibrillation (HCC) 10/19/2023   Acute pulmonary embolism (HCC) 10/18/2023   Adnexal mass 07/28/2022   Dehydration 07/14/2022   Nausea with vomiting 07/14/2022   Altered mental status 03/22/2022   Noncompliance 01/29/2022   Constipation    Generalized weakness    Fall at home, initial encounter 07/23/2021   Lung nodule 07/23/2021   Elevated lactic acid level 07/23/2021   Prolonged QT interval 07/23/2021   Atrial fibrillation, chronic (HCC)    Weakness    Palliative care encounter    Acute cystitis with hematuria    Type 2 diabetes mellitus without complication, with long-term current use of insulin (HCC)    Increased ammonia level    Encephalopathy 03/07/2021   Transient confusion 03/06/2021   Intertrigo 03/06/2021   Encounter for antineoplastic  chemotherapy 01/08/2021   Encounter for antineoplastic immunotherapy 01/08/2021   Goals of care, counseling/discussion 10/17/2020   Hypercalcemia 10/17/2020   Chest mass 10/17/2020   Paroxysmal atrial fibrillation with rapid ventricular response (HCC) 09/26/2020   Depression 09/26/2020   Diabetes type 2, uncontrolled 09/26/2020   Hypokalemia 09/26/2020   Renal cancer, right (HCC) 09/26/2020   Acute delirium 09/26/2020   Ureteral stone 09/26/2020    Orientation RESPIRATION BLADDER Height & Weight     Self  Normal Incontinent Weight: 72.6 kg Height:  5\' 2"  (157.5 cm)  BEHAVIORAL SYMPTOMS/MOOD NEUROLOGICAL BOWEL NUTRITION STATUS      Incontinent Diet (see dc summary)  AMBULATORY STATUS COMMUNICATION OF NEEDS Skin   Extensive Assist Verbally Normal                       Personal Care Assistance Level of Assistance  Bathing, Feeding, Dressing Bathing Assistance: Maximum assistance Feeding assistance: Limited assistance Dressing Assistance: Maximum assistance     Functional Limitations Info  Sight, Hearing, Speech Sight Info: Adequate Hearing Info: Adequate Speech Info: Adequate    SPECIAL CARE FACTORS FREQUENCY  PT (By licensed PT), OT (By licensed OT)     PT Frequency: 5 times per week OT Frequency: 8 times per week            Contractures Contractures Info: Not present    Additional Factors Info  Code Status, Allergies Code Status Info: DNR Allergies Info: kiwi Extract           Current Medications (10/24/2023):  This is the current hospital active medication  list Current Facility-Administered Medications  Medication Dose Route Frequency Provider Last Rate Last Admin   acetaminophen (TYLENOL) tablet 650 mg  650 mg Oral TID Mikey College T, MD   650 mg at 10/24/23 1024   acidophilus (RISAQUAD) capsule 2 capsule  2 capsule Oral TID Mikey College T, MD   2 capsule at 10/24/23 1037   carvedilol (COREG) tablet 12.5 mg  12.5 mg Oral BID WC Alford Highland, MD    12.5 mg at 10/24/23 0840   cefTRIAXone (ROCEPHIN) 1 g in sodium chloride 0.9 % 100 mL IVPB  1 g Intravenous Q24H Mikey College T, MD 200 mL/hr at 10/24/23 1049 1 g at 10/24/23 1049   diltiazem (CARDIZEM CD) 24 hr capsule 240 mg  240 mg Oral QPM Wieting, Richard, MD   240 mg at 10/23/23 1700   doxycycline (VIBRA-TABS) tablet 100 mg  100 mg Oral Q12H Mikey College T, MD   100 mg at 10/24/23 1039   escitalopram (LEXAPRO) tablet 10 mg  10 mg Oral Daily Mikey College T, MD   10 mg at 10/24/23 1022   famotidine (PEPCID) tablet 40 mg  40 mg Oral Daily Mikey College T, MD   40 mg at 10/24/23 1025   feeding supplement (GLUCERNA SHAKE) (GLUCERNA SHAKE) liquid 237 mL  237 mL Oral TID BM Alford Highland, MD   237 mL at 10/23/23 2202   guaiFENesin (MUCINEX) 12 hr tablet 1,200 mg  1,200 mg Oral BID Mikey College T, MD   1,200 mg at 10/24/23 1040   insulin aspart (novoLOG) injection 0-15 Units  0-15 Units Subcutaneous TID WC Mikey College T, MD   3 Units at 10/24/23 1233   insulin aspart (novoLOG) injection 0-5 Units  0-5 Units Subcutaneous QHS Mikey College T, MD   2 Units at 10/20/23 2245   insulin glargine-yfgn (SEMGLEE) injection 23 Units  23 Units Subcutaneous Daily Alford Highland, MD   23 Units at 10/24/23 1017   levalbuterol (XOPENEX) nebulizer solution 0.63 mg  0.63 mg Inhalation TID Alford Highland, MD   0.63 mg at 10/24/23 0743   miconazole (MICOTIN) 2 % cream 1 Application  1 Application Topical BID Mikey College T, MD   1 Application at 10/24/23 1051   Oral care mouth rinse  15 mL Mouth Rinse PRN Alford Highland, MD       oxyCODONE (Oxy IR/ROXICODONE) immediate release tablet 2.5 mg  2.5 mg Oral Q6H PRN Alford Highland, MD   2.5 mg at 10/24/23 0016   polyethylene glycol (MIRALAX / GLYCOLAX) packet 17 g  17 g Oral Daily Alford Highland, MD   17 g at 10/24/23 1042   polyvinyl alcohol (LIQUIFILM TEARS) 1.4 % ophthalmic solution 1 drop  1 drop Both Eyes BID PRN Mikey College T, MD       QUEtiapine (SEROQUEL) tablet  12.5 mg  12.5 mg Oral QHS PRN Alford Highland, MD   12.5 mg at 10/24/23 0016   Rivaroxaban (XARELTO) tablet 15 mg  15 mg Oral BID WC Rockwell Alexandria, RPH   15 mg at 10/24/23 0850   Followed by   Melene Muller ON 11/08/2023] rivaroxaban (XARELTO) tablet 20 mg  20 mg Oral Q supper Rockwell Alexandria, [facility]         Discharge Medications: Please see discharge summary for a list of discharge medications.  Relevant Imaging Results:  Relevant Lab Results:   Additional Information SSN: [id number]  Marlowe Sax, RN

## 2023-10-24 NOTE — Progress Notes (Signed)
[facility] Liaison Note  Current hospice patient.  Spoke with patient's caregiver, Levester Fresh today.  He is seeking short term rehab for Ms. Lenig.  Hospice revocation paperwork sent to [facility] via email for signature.  Patient discharged from hospice as of 2.17.2025.  Norris Cross, RN Nurse Liaison [phone]

## 2023-10-24 NOTE — TOC Progression Note (Addendum)
Transition of Care [facility]) - Progression Note    Patient Details  Name: Marcellene Shivley MRN: [id number] Date of Birth: 1946-01-20  Transition of Care [facility]) CM/SW Contact  Marlowe Sax, RN Phone Number: 10/24/2023, 10:48 AM  Clinical Narrative:   Called and spoke to Maralyn Sago at [facility] to inquire when they plan to come to assess the patient to return, she will speak to the care managers and call me back to let me know  Maralyn Sago called back and stated that they are short staffed, they will take her back to go ahead and send her, they understand that she will require assistance with everything including feeding and that she has hospice and will not be able to get therapy, they want a faxed fl2 and discharge summary to [phone] I called the POA Duanne Guess and explained that [facility] will accept her back but she would not get therapy followed by hospice. I explained that in in order for her to get Ins approval she would have to participate with therapy at a STR , he stated understanding, He decided he would like to rescind hospice and go to short term rehab then return to hospice and [employer]          Expected Discharge Plan and Services                                               Social Determinants of Health (SDOH) Interventions SDOH Screenings   Food Insecurity: Patient Unable To Answer (10/19/2023)  Housing: Patient Unable To Answer (10/19/2023)  Transportation Needs: Patient Unable To Answer (10/19/2023)  Utilities: Patient Unable To Answer (10/19/2023)  Social Connections: Patient Unable To Answer (10/19/2023)  Tobacco Use: Low Risk  (10/19/2023)    Readmission Risk Interventions     No data to display

## 2023-10-24 NOTE — Progress Notes (Signed)
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  Patient also has a history of paroxysmal atrial fibrillation on Eliquis, diabetes, hypertension presented with new onset cough and shortness of breath and a fall.  Symptoms started about a week ago with a dry cough.  She is also sore on her abdomen from all the coughing.  She has been taking laxatives but now has diarrhea.  CT scan of the chest showed a small pulmonary embolism, left internal jugular DVT, enlarging right renal mass and metastatic lesion to the rib with pneumonia versus metastasis in the chest.  Patient started on antibiotics.  Patient also started on Cardizem drip for rapid atrial fibrillation.  2/12.  Add oral Cardizem and oral Coreg to try to taper off Cardizem drip.  Continue antibiotics. 2/13.  Patient lethargic after pain medication. 2/14.  CT scan negative. 2/15.  Patient did not sleep very well last night.  Will order as needed Seroquel if does not sleep tonight. 2/16.  Slept better last night.  Not having any pain.  Able to straight leg raise today.  Mental status improved. 2/17.  More confused today than yesterday.  Yesterday was a good day.  Assessment and Plan: * Lethargy More confused today.  Continue to monitor.  As needed Seroquel at night.  Paroxysmal atrial fibrillation with rapid ventricular response (HCC) Patient tapered off Cardizem drip on 10/19/2023.  Patient on increased dose of Coreg and Cardizem CD.  Patient on Xarelto for anticoagulation.  Heart rate stable.  Acute pulmonary embolism [facility]) Oncology recommending changing Eliquis to Xarelto.  Lower extremity ultrasound negative for DVT.  Left internal jugular DVT.  Multifocal  pneumonia Patient on Rocephin and doxycycline.  Will do 7 days total.  After coughing lungs sound better than yesterday.  Continue nebulizer treatments.  Renal cancer, right (HCC) Right renal mass enlarging in size.  Mets to lung and bone.  Patient is a DNR.  In order to go to rehab they rescinded hospice.  Hypomagnesemia Replaced  Hyponatremia Last sodium normal range  Uncontrolled type 2 diabetes mellitus with hyperglycemia, with long-term current use of insulin (HCC) Hemoglobin A1c 11.1.  Decreased to 23 units of Semglee insulin and sliding scale.  Holding metformin.   Overweight (BMI 25.0-29.9) BMI 29.27  Closed fracture of proximal end of right humerus Sling.  Oral pain medications only.  Not a candidate for surgery.  Diarrhea Stool comprehensive panel negative and C. difficile negative.  Depression On Lexapro        Subjective: Patient answered a few questions but more lethargic today than yesterday.  Admitted with pneumonia.  And right proximal shoulder fracture.  Physical Exam: Vitals:   10/23/23 1931 10/23/23 2153 10/24/23 0730 10/24/23 1448  BP:  137/71 122/60 (!) 143/81  Pulse:   90 87  Resp:  18 18 18   Temp:  98.2 F (36.8 C) 98 F (36.7 C) (!) 97.4 F (36.3 C)  TempSrc:  Axillary Oral   SpO2: 98% 95% 99% 100%  Weight:      Height:       Physical Exam HENT:     Head: Normocephalic.     Mouth/Throat:     Pharynx: No oropharyngeal exudate.  Eyes:     General: Lids  are normal.     Conjunctiva/sclera: Conjunctivae normal.  Cardiovascular:     Rate and Rhythm: Normal rate. Rhythm irregularly irregular.     Heart sounds: Normal heart sounds, S1 normal and S2 normal.  Pulmonary:     Breath sounds: Examination of the right-middle field reveals rhonchi. Examination of the left-middle field reveals rhonchi. Examination of the right-lower field reveals decreased breath sounds and rhonchi. Examination of the left-lower field reveals decreased breath sounds  and rhonchi. Decreased breath sounds and rhonchi present. No wheezing or rales.  Abdominal:     Palpations: Abdomen is soft.     Tenderness: There is no abdominal tenderness.  Musculoskeletal:     Right lower leg: Swelling present.     Left lower leg: Swelling present.     Comments: Right arm in sling.  Skin:    General: Skin is warm.     Findings: No rash.     Comments: Bruise right shoulder and right forehead.  Neurological:     Mental Status: She is lethargic.     Data Reviewed: No new data  Family Communication: Spoke with POA on the phone  Disposition: Status is: Inpatient Remains inpatient appropriate because: POA interested in her going to rehab and they rescinded hospice in order to go to rehab.  Planned Discharge Destination: Rehab    Time spent: 28 minutes  Author: Alford Highland, MD 10/24/2023 4:09 PM  For on call review [email or web address].

## 2023-10-24 NOTE — Plan of Care (Signed)
  Problem: Nutritional: Goal: Maintenance of adequate nutrition will improve Outcome: Progressing Goal: Progress toward achieving an optimal weight will improve Outcome: Progressing   Problem: Skin Integrity: Goal: Risk for impaired skin integrity will decrease Outcome: Progressing   Problem: Tissue Perfusion: Goal: Adequacy of tissue perfusion will improve Outcome: Progressing

## 2023-10-25 DIAGNOSIS — I2699 Other pulmonary embolism without acute cor pulmonale: Secondary | ICD-10-CM | POA: Diagnosis not present

## 2023-10-25 DIAGNOSIS — R5383 Other fatigue: Secondary | ICD-10-CM | POA: Diagnosis not present

## 2023-10-25 DIAGNOSIS — I480 Paroxysmal atrial fibrillation: Secondary | ICD-10-CM | POA: Diagnosis not present

## 2023-10-25 DIAGNOSIS — J189 Pneumonia, unspecified organism: Secondary | ICD-10-CM | POA: Diagnosis not present

## 2023-10-25 LAB — GLUCOSE, CAPILLARY
Glucose-Capillary: 115 mg/dL — ABNORMAL HIGH (ref 70–99)
Glucose-Capillary: 193 mg/dL — ABNORMAL HIGH (ref 70–99)
Glucose-Capillary: 239 mg/dL — ABNORMAL HIGH (ref 70–99)
Glucose-Capillary: 236 mg/dL — ABNORMAL HIGH (ref 70–99)

## 2023-10-25 LAB — CBC WITH DIFFERENTIAL/PLATELET
Abs Immature Granulocytes: 0.24 10*3/uL — ABNORMAL HIGH (ref 0.00–0.07)
Basophils Absolute: 0.1 10*3/uL (ref 0.0–0.1)
Basophils Relative: 1 %
Eosinophils Absolute: 0.1 10*3/uL (ref 0.0–0.5)
Eosinophils Relative: 1 %
HCT: 36.9 % (ref 36.0–46.0)
Hemoglobin: 11.2 g/dL — ABNORMAL LOW (ref 12.0–15.0)
Immature Granulocytes: 2 %
Lymphocytes Relative: 13 %
Lymphs Abs: 1.4 10*3/uL (ref 0.7–4.0)
MCH: 25.1 pg — ABNORMAL LOW (ref 26.0–34.0)
MCHC: 30.4 g/dL (ref 30.0–36.0)
MCV: 82.6 fL (ref 80.0–100.0)
Monocytes Absolute: 1 10*3/uL (ref 0.1–1.0)
Monocytes Relative: 9 %
Neutro Abs: 7.7 10*3/uL (ref 1.7–7.7)
Neutrophils Relative %: 74 %
Platelets: 346 10*3/uL (ref 150–400)
RBC: 4.47 MIL/uL (ref 3.87–5.11)
RDW: 14.9 % (ref 11.5–15.5)
WBC: 10.5 10*3/uL (ref 4.0–10.5)
nRBC: 0 % (ref 0.0–0.2)

## 2023-10-25 LAB — BASIC METABOLIC PANEL
Anion gap: 9 (ref 5–15)
BUN: 18 mg/dL (ref 8–23)
CO2: 25 mmol/L (ref 22–32)
Calcium: 10 mg/dL (ref 8.9–10.3)
Chloride: 103 mmol/L (ref 98–111)
Creatinine, Ser: 0.5 mg/dL (ref 0.44–1.00)
GFR, Estimated: >60 mL/min (ref >60)
Glucose, Bld: 131 mg/dL — ABNORMAL HIGH (ref 70–99)
Potassium: 4.3 mmol/L (ref 3.5–5.1)
Sodium: 137 mmol/L (ref 135–145)

## 2023-10-25 LAB — MAGNESIUM: Magnesium: 1.6 mg/dL — ABNORMAL LOW (ref 1.7–2.4)

## 2023-10-25 MED ORDER — MAGNESIUM SULFATE 2 GM/50ML IV SOLN
2.0000 g | Freq: Once | INTRAVENOUS | Status: AC
  Administered 2023-10-25: 2 g via INTRAVENOUS
  Filled 2023-10-25: qty 50

## 2023-10-25 NOTE — Progress Notes (Signed)
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  Patient also has a history of paroxysmal atrial fibrillation on Eliquis, diabetes, hypertension   OT comments  Chart reviewed to date, pt greeted in room, oriented to self, agreeable to OT tx session targeting improving functional activity tolerance in preparation for improved ADL performance.Improvements noted in mobility tolerance throughout with pt requiring +2 for safe amb on this date approx 20', initial MAX A+1 (with +2 for safety), progress to MIN A +1-2. Supervision for grooming tasks, MAX A for LB dressing. MAX A for UB dressing/adjust sling. HR up to 133 bpm during amb, all other vitals appear stable throughout. Pt is left in chair, all needs met. OT will continue to follow acutely.       If plan is discharge home, recommend the following:  A lot of help with walking and/or transfers;A lot of help with bathing/dressing/bathroom   Equipment Recommendations  Wheelchair (measurements OT)    Recommendations for Other Services      Precautions / Restrictions Precautions Precautions: Fall Recall of Precautions/Restrictions: Impaired Required Braces or Orthoses: Sling Restrictions Weight Bearing Restrictions Per Provider Order: Yes RUE Weight Bearing Per Provider Order: Non weight bearing       Mobility Bed Mobility Overal bed mobility: Needs Assistance Bed Mobility: Supine to Sit     Supine to sit: Max assist, Used rails          Transfers Overall transfer level: Needs assistance Equipment  used: 1 person hand held assist Transfers: Sit to/from Stand Sit to Stand: +2 safety/equipment, From elevated surface, Max assist                 Balance Overall balance assessment: Needs assistance Sitting-balance support: Feet supported Sitting balance-Leahy Scale: Fair     Standing balance support: During functional activity, Reliant on assistive device for balance, Single extremity supported Standing balance-Leahy Scale: Poor                             ADL either performed or assessed with clinical judgement   ADL Overall ADL's : Needs assistance/impaired     Grooming: Wash/dry face;Sitting;Supervision/safety           Upper Body Dressing : Maximal assistance Upper Body Dressing Details (indicate cue type and reason): adjust slign Lower Body Dressing: Maximal assistance;Sitting/lateral leans;Sit to/from stand Lower Body Dressing Details (indicate cue type and reason): socks Toilet Transfer: Minimal assistance Toilet Transfer Details (indicate cue type and reason): via HHA after amb, simulated Toileting- Clothing Manipulation and Hygiene: Maximal assistance       Functional mobility during ADLs:  (Initially MAX A +1-2 via HHA, progress to MIN A +1 with pt pushing IV pole with LUE)      Extremity/Trunk Assessment Upper Extremity Assessment RUE Deficits / Details: RUE in sling throughout            Vision       Perception     Praxis     Communication Communication Communication: Impaired   Cognition Arousal: Alert Behavior During Therapy: WFL for tasks assessed/performed, Flat  affect Cognition: No family/caregiver present to determine baseline, Cognition impaired   Orientation impairments: Place, Time, Situation Awareness: Intellectual awareness impaired, Online awareness impaired Memory impairment (select all impairments): Short-term memory Attention impairment (select first level of impairment): Sustained attention                      Following commands: Impaired Following commands impaired: Follows one step commands with increased time      Cueing   Cueing Techniques: Verbal cues, Visual cues, Tactile cues  Exercises Other Exercises Other Exercises: edu re: role of OT, role of rehab, importance of progressing mobility    Shoulder Instructions       General Comments      Pertinent Vitals/ Pain       Pain Assessment Pain Assessment: PAINAD Breathing: normal Negative Vocalization: occasional moan/groan, low speech, negative/disapproving quality Facial Expression: smiling or inexpressive Body Language: tense, distressed pacing, fidgeting Consolability: distracted or reassured by voice/touch PAINAD Score: 3 Pain Location: RUE with movement Pain Descriptors / Indicators: Sore, Guarding, Grimacing Pain Intervention(s): Monitored during session, Repositioned, Limited activity within patient's tolerance, Premedicated before session  Home Living                                          Prior Functioning/Environment              Frequency  Min 1X/week        Progress Toward Goals  OT Goals(current goals can now be found in the care plan section)  Progress towards OT goals: Progressing toward goals  Acute Rehab OT Goals Time For Goal Achievement: 11/03/23  Plan      Co-evaluation      Reason for Co-Treatment: Complexity of the patient's impairments (multi-system involvement);For patient/therapist safety PT goals addressed during session: Mobility/safety with mobility;Balance;Proper use of DME;Strengthening/ROM        AM-PAC OT "6 Clicks" Daily Activity     Outcome Measure   Help from another person eating meals?: A Little Help from another person taking care of personal grooming?: A Little Help from another person toileting, which includes using toliet, bedpan, or urinal?: A Lot Help from another person bathing (including washing, rinsing, drying)?: A  Lot Help from another person to put on and taking off regular upper body clothing?: A Lot Help from another person to put on and taking off regular lower body clothing?: A Lot 6 Click Score: 14    End of Session    OT Visit Diagnosis: Other abnormalities of gait and mobility (R26.89);Muscle weakness (generalized) (M62.81);Unsteadiness on feet (R26.81)   Activity Tolerance Patient tolerated treatment well   Patient Left in chair;with call bell/phone within reach;with chair alarm set   Nurse Communication Mobility status        Time: 2956-2130 OT Time Calculation (min): 28 min  Charges: OT General Charges $OT Visit: 1 Visit OT Treatments $Therapeutic Activity: 8-22 mins  Oleta Mouse, OTD OTR/L  10/25/23, 3:56 PM

## 2023-10-25 NOTE — Plan of Care (Signed)
  Problem: Coping: Goal: Ability to adjust to condition or change in health will improve Outcome: Progressing   Problem: Fluid Volume: Goal: Ability to maintain a balanced intake and output will improve Outcome: Progressing   Problem: Health Behavior/Discharge Planning: Goal: Ability to identify and utilize available resources and services will improve Outcome: Not Progressing

## 2023-10-25 NOTE — Progress Notes (Signed)
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  Patient also has a history of paroxysmal atrial fibrillation on Eliquis, diabetes, hypertension    PT Comments  PT/OT co-treat for pt/staff safety and pt's poor activity tolerance overall. Pt is alert and cooperative but disoriented.vitals at rest HR: 92 bpm, 117/74 (89), sao2 100%. Per pt's POA, pt's baseline cognition is not great but current presentation is much worse. Confirmed pt's PLOF. Pt was able to ambulate to dinning hall at [employer] without RW without assistance. Currently pt required +2 assistance for safety. Pt's vitals remains stable throughout session with HR elevated to 133 bpm at peak with gait training. No c/o dizziness however did endorse R UE/arm pain. Pt is progressing and seems to have better cognition today versus last PT session. Acute PT will continue to follow and progress per current POC. Recommend +2 assistance for all OOB activity. Highly recommend continued skilled PT at DC to  maximize independence and safety with all ADLs while assisting pt to PLOF.    If plan is discharge home, recommend the following: Two people to help with walking and/or transfers;Two people to help with bathing/dressing/bathroom;Assistance with cooking/housework;Assistance with feeding;Direct supervision/assist for medications management;Direct supervision/assist for financial management;Assist for transportation;Supervision due to cognitive status     Equipment Recommendations  Other (comment) (Defer to next level of  care)       Precautions / Restrictions Precautions Precautions: Fall Restrictions Weight Bearing Restrictions Per Provider Order: No     Mobility  Bed Mobility Overal bed mobility: Needs Assistance Bed Mobility: Supine to Sit  Supine to sit: Max assist, Used rails  General bed mobility comments: Pt was able to achieve EOB siutting with max assist of one. did well with rollingtowards L but from sidelying needed extensive assistance. RUE sling in place throughout    Transfers Overall transfer level: Needs assistance Equipment used: 1 person hand held assist Transfers: Sit to/from Stand Sit to Stand: +2 safety/equipment, From elevated surface, Max assist  General transfer comment: pt stood max assist of 1 with 2nd person close/CGA for safety . pt 's cognition and poor overall balance make pt high fall risk    Ambulation/Gait Ambulation/Gait assistance: Max assist, Min assist, +2 safety/equipment Gait Distance (Feet): 30 Feet Assistive device: 1 person hand held assist, IV Pole Gait Pattern/deviations: Step-to pattern, Step-through pattern, Staggering right, Staggering left, Narrow base of support Gait velocity: decreased  General Gait Details: Pt was able to ambulate to doorway of room and return to recliner. Pt is extremely unsteady however max assist at first with gait progressed to min assist of one. 2nd person for equipment management and additional safety    Balance Overall balance assessment: Needs assistance Sitting-balance support: Feet supported Sitting balance-Leahy Scale: Fair     Standing balance support: Bilateral upper extremity supported, During functional activity, Reliant on assistive device for balance Standing balance-Leahy Scale: Poor       Communication Communication Communication: Impaired  Cognition Arousal: Alert Behavior During Therapy: WFL for tasks assessed/performed, Flat affect   PT - Cognitive impairments: History of cognitive impairments  (per POA, cognition is still much worse  than baseline)   PT - Cognition Comments: limited verbalizations during session. Following commands: Impaired      Cueing Cueing Techniques: Verbal cues, Visual cues, Tactile cues         Pertinent Vitals/Pain Pain Assessment Pain Assessment: PAINAD Breathing: normal Negative Vocalization: occasional moan/groan, low speech, negative/disapproving quality Facial Expression: smiling or inexpressive Body Language: tense, distressed pacing, fidgeting Consolability: distracted or reassured by voice/touch PAINAD Score: 3 Pain Location: hyper responsive to any movement of the R UE, painful adjustment of sling to reposition as it is not supporting shoulder in sitting. Pain Descriptors / Indicators: Sore, Guarding, Grimacing Pain Intervention(s): Limited activity within patient's tolerance, Monitored during session, Premedicated before session, Repositioned     PT Goals (current goals can now be found in the care plan section) Acute Rehab PT Goals Patient Stated Goal: none stated Progress towards PT goals: Progressing toward goals    Frequency    Min 1X/week       Co-evaluation PT/OT/SLP Co-Evaluation/Treatment: Yes Reason for Co-Treatment: Complexity of the patient's impairments (multi-system involvement);For patient/therapist safety PT goals addressed during session: Mobility/safety with mobility;Balance;Proper use of DME;Strengthening/ROM        AM-PAC PT "6 Clicks" Mobility   Outcome Measure  Help needed turning from your back to your side while in a flat bed without using bedrails?: A Lot Help needed moving from lying on your back to sitting on the side of a flat bed without using bedrails?: A Lot Help needed moving to and from a bed to a chair (including a wheelchair)?: A Lot Help needed standing up from a chair using your arms (e.g., wheelchair or bedside chair)?: A Lot Help needed to walk in hospital room?: A Lot Help needed  climbing 3-5 steps with a railing? : Total 6 Click Score: 11    End of Session   Activity Tolerance: Patient tolerated treatment well;Patient limited by lethargy Patient left: in chair;with call bell/phone within reach;with chair alarm set Nurse Communication: Mobility status PT Visit Diagnosis: Muscle weakness (generalized) (M62.81);Difficulty in walking, not elsewhere classified (R26.2)     Time: 9604-5409 PT Time Calculation (min) (ACUTE ONLY): 27 min  Charges:    $Therapeutic Activity: 8-22 mins PT General Charges $$ ACUTE PT VISIT: 1 Visit                     Jetta Lout PTA 10/25/23, 3:26 PM

## 2023-10-25 NOTE — TOC Progression Note (Signed)
Transition of Care [facility]) - Progression Note    Patient Details  Name: Barbara Frank MRN: [id number] Date of Birth: March 14, 1946  Transition of Care [facility]) CM/SW Contact  Marlowe Sax, RN Phone Number: 10/25/2023, 4:16 PM  Clinical Narrative:     Elvina Sidle to review the one bed offer in place to go to [facility] He approved going to [facility] I notified Tonya at [facility] Ins pending       Expected Discharge Plan and Services                                               Social Determinants of Health (SDOH) Interventions SDOH Screenings   Food Insecurity: Patient Unable To Answer (10/19/2023)  Housing: Patient Unable To Answer (10/19/2023)  Transportation Needs: Patient Unable To Answer (10/19/2023)  Utilities: Patient Unable To Answer (10/19/2023)  Social Connections: Patient Unable To Answer (10/19/2023)  Tobacco Use: Low Risk  (10/19/2023)    Readmission Risk Interventions     No data to display

## 2023-10-25 NOTE — Plan of Care (Signed)

## 2023-10-25 NOTE — Progress Notes (Signed)
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  Patient also has a history of paroxysmal atrial fibrillation on Eliquis, diabetes, hypertension presented with new onset cough and shortness of breath and a fall.  Symptoms started about a week ago with a dry cough.  She is also sore on her abdomen from all the coughing.  She has been taking laxatives but now has diarrhea.  CT scan of the chest showed a small pulmonary embolism, left internal jugular DVT, enlarging right renal mass and metastatic lesion to the rib with pneumonia versus metastasis in the chest.  Patient started on antibiotics.  Patient also started on Cardizem drip for rapid atrial fibrillation.  2/12.  Add oral Cardizem and oral Coreg to try to taper off Cardizem drip.  Continue antibiotics. 2/13.  Patient lethargic after pain medication. 2/14.  CT scan negative. 2/15.  Patient did not sleep very well last night.  Will order as needed Seroquel if does not sleep tonight. 2/16.  Slept better last night.  Not having any pain.  Able to straight leg raise today.  Mental status improved. 2/17.  More confused today than yesterday.  Yesterday was a good day. 2/18.  Patient had a better day today.  More alert and able to follow commands and straight leg raise.  Replaced IV magnesium.  Still awaiting a bed offer  Assessment and Plan: * Lethargy Mental status better today.  She has had waxing and waning mental status during the hospital course.  CT head on 2/14 showed no evidence of acute intracranial abnormality, right scalp anterior hematoma, cerebral atrophy and chronic small vessel ischemic change.  As needed Seroquel at night.  Paroxysmal atrial fibrillation  with rapid ventricular response (HCC) Patient tapered off Cardizem drip on 10/19/2023.  Patient on increased dose of Coreg and Cardizem CD.  Patient on Xarelto for anticoagulation.  Heart rate stable.  Acute pulmonary embolism [facility]) Oncology recommending changing Eliquis to Xarelto.  Lower extremity ultrasound negative for DVT.  Left internal jugular DVT.  Multifocal pneumonia Completed a full course of antibiotics.  After coughing lungs sound better than yesterday.  Continue nebulizer treatments.  Renal cancer, right (HCC) Right renal mass enlarging in size.  Mets to lung and bone.  Patient is a DNR.  In order to go to rehab they rescinded hospice.  Hypomagnesemia Replace IV again today  Hyponatremia Last sodium normal range  Uncontrolled type 2 diabetes mellitus with hyperglycemia, with long-term current use of insulin (HCC) Hemoglobin A1c 11.1.  Decreased to 23 units of Semglee insulin and sliding scale.  Holding metformin.   Overweight (BMI 25.0-29.9) BMI 29.27  Closed fracture of proximal end of right humerus Sling.  Oral pain medications only.  Not a candidate for surgery.  Refer to orthopedic as outpatient.  Diarrhea On admission stool comprehensive panel negative and C. difficile negative.  Depression On Lexapro        Subjective: Patient more alert today.  Answers questions.  Straight leg raise with both legs at the same time for me.  Admitted after a fall and found to have rapid atrial fibrillation and a right humerus fracture  Physical Exam: Vitals:   10/24/23 1927 10/24/23 2303 10/25/23 0700 10/25/23 0811  BP:  121/81 120/80  Pulse:  96 99   Resp:  18 18   Temp:  97.8 F (36.6 C) 97.8 F (36.6 C)   TempSrc:  Oral Oral   SpO2: 90% 100% 98% 99%  Weight:      Height:       Physical Exam HENT:     Head: Normocephalic.     Mouth/Throat:     Pharynx: No oropharyngeal exudate.  Eyes:     General: Lids are normal.     Conjunctiva/sclera: Conjunctivae  normal.  Cardiovascular:     Rate and Rhythm: Normal rate. Rhythm irregularly irregular.     Heart sounds: Normal heart sounds, S1 normal and S2 normal.  Pulmonary:     Breath sounds: Examination of the right-lower field reveals decreased breath sounds. Examination of the left-lower field reveals decreased breath sounds. Decreased breath sounds present. No wheezing, rhonchi or rales.  Abdominal:     Palpations: Abdomen is soft.     Tenderness: There is no abdominal tenderness.  Musculoskeletal:     Right lower leg: Swelling present.     Left lower leg: Swelling present.     Comments: Right arm in sling.  Skin:    General: Skin is warm.     Findings: No rash.     Comments: Bruise right shoulder and right forehead.  Neurological:     Mental Status: She is lethargic.     Data Reviewed: Creatinine 0.5, sodium 137, magnesium 1.6, hemoglobin 11.2, white blood cell count 10.5, platelet count 346  Family Communication: Spoke with Duanne Guess on the phone  Disposition: Status is: Inpatient Remains inpatient appropriate because: We do not have a rehab bed yet and then will need insurance authorization.  Planned Discharge Destination: Rehab    Time spent: 27 minutes  Author: Alford Highland, MD 10/25/2023 4:09 PM  For on call review [email or web address].

## 2023-10-25 NOTE — TOC Progression Note (Signed)
Transition of Care [facility]) - Progression Note    Patient Details  Name: Barbara Frank MRN: [id number] Date of Birth: 08/20/1946  Transition of Care [facility]) CM/SW Contact  Marlowe Sax, RN Phone Number: 10/25/2023, 9:20 AM  Clinical Narrative:   No bed offers at this time, resent the bed search         Expected Discharge Plan and Services                                               Social Determinants of Health (SDOH) Interventions SDOH Screenings   Food Insecurity: Patient Unable To Answer (10/19/2023)  Housing: Patient Unable To Answer (10/19/2023)  Transportation Needs: Patient Unable To Answer (10/19/2023)  Utilities: Patient Unable To Answer (10/19/2023)  Social Connections: Patient Unable To Answer (10/19/2023)  Tobacco Use: Low Risk  (10/19/2023)    Readmission Risk Interventions     No data to display

## 2023-10-26 DIAGNOSIS — R5383 Other fatigue: Secondary | ICD-10-CM | POA: Diagnosis not present

## 2023-10-26 LAB — GLUCOSE, CAPILLARY
Glucose-Capillary: 183 mg/dL — ABNORMAL HIGH (ref 70–99)
Glucose-Capillary: 243 mg/dL — ABNORMAL HIGH (ref 70–99)
Glucose-Capillary: 151 mg/dL — ABNORMAL HIGH (ref 70–99)
Glucose-Capillary: 97 mg/dL (ref 70–99)

## 2023-10-26 MED ORDER — LEVALBUTEROL HCL 0.63 MG/3ML IN NEBU
0.6300 mg | INHALATION_SOLUTION | Freq: Four times a day (QID) | RESPIRATORY_TRACT | Status: DC | PRN
Start: 2023-10-26 — End: 2023-10-27

## 2023-10-26 NOTE — Progress Notes (Signed)
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  Patient also has a history of paroxysmal atrial fibrillation on Eliquis, diabetes, hypertension    PT Comments  Pt was long sitting in bed. Pleasantly confused but oriented to self. Poor awareness of situation but able to follow commands well. Pt is agreeable to OOB activity. Does not endorse pain at rest but does have facial grimace at times when RUE adjusted in sling. Pt required extensive assist to exit bed, stand, and ambulate ~ 40 ft with HHA +1. Psychologist, occupational to have +2 assist for additional safety. Pt was repositioned in recliner with chair alarm in place. Author assisted with breakfast tray set up. RN staff in room at conclusion of PT session. DC recs remain appropriate to maximize independence and safety with all ADLs.       If plan is discharge home, recommend the following: A lot of help with walking and/or transfers;Two people to help with bathing/dressing/bathroom;Assistance with cooking/housework;Assistance with feeding;Direct supervision/assist for medications management;Direct supervision/assist for financial management;Assist for transportation;Help with stairs or ramp for entrance;Supervision due to cognitive status     Equipment Recommendations  Other (comment) (defer to next level of care)       Precautions / Restrictions Precautions Precautions: Fall Recall of Precautions/Restrictions: Impaired Required Braces or Orthoses: Sling Restrictions Weight Bearing Restrictions Per Provider Order:  Yes RUE Weight Bearing Per Provider Order: Non weight bearing     Mobility  Bed Mobility Overal bed mobility: Needs Assistance Bed Mobility: Supine to Sit Sidelying to sit: Mod assist Supine to sit: Mod assist, Max assist  General bed mobility comments: pt struggles due to inability to use RUE. VCs/TCs for sequencing    Transfers Overall transfer level: Needs assistance Equipment used: 1 person hand held assist Transfers: Sit to/from Stand Sit to Stand: From elevated surface, Mod assist  General transfer comment: Mod assist to stand from EOB with HHA +1 LUE. gait belt used for additional safety. pt unsteady at first that improved with time    Ambulation/Gait Ambulation/Gait assistance: Min assist, Mod assist Gait Distance (Feet): 40 Feet Assistive device: 1 person hand held assist, IV Pole Gait Pattern/deviations: Step-to pattern, Step-through pattern, Staggering right, Staggering left, Narrow base of support Gait velocity: decreased  General Gait Details: Pt was able to ambulate ~40 ft with HHA + 1 and use of gait belt for additional safety. pt struggles with hemiwalker attempts due to coordination/cognition deficits    Balance Overall balance assessment: Needs assistance Sitting-balance support: Feet supported Sitting balance-Leahy Scale: Fair Sitting balance - Comments: slightly improved from previous date   Standing balance support: Single extremity supported, During functional activity Standing balance-Leahy Scale: Poor Standing balance comment: pt remains high fall risk    Communication Communication Communication: Impaired  Cognition Arousal: Alert Behavior During Therapy: Flat affect   PT - Cognitive impairments: History of cognitive impairments (POA states worse now than at baseline)    PT - Cognition Comments: limited verbalizations during session. Following commands: Impaired Following commands impaired: Follows one step commands with increased time     Cueing Cueing Techniques: Verbal cues, Visual cues, Tactile  cues         Pertinent Vitals/Pain Pain Assessment Pain Assessment: PAINAD Breathing: normal Negative Vocalization: occasional moan/groan, low speech, negative/disapproving quality Facial Expression: smiling or inexpressive Body Language: relaxed Consolability: no need to console PAINAD Score: 1 Pain Location: RUE with movement Pain Descriptors / Indicators: Sore, Guarding, Grimacing Pain Intervention(s): Limited activity within patient's tolerance, Monitored during session, Repositioned     PT Goals (current goals can now be found in the care plan section) Acute Rehab PT Goals Patient Stated Goal: none stated Progress towards PT goals: Progressing toward goals    Frequency    Min 1X/week       Co-evaluation     PT goals addressed during session: Mobility/safety with mobility;Balance;Proper use of DME;Strengthening/ROM        AM-PAC PT "6 Clicks" Mobility   Outcome Measure  Help needed turning from your back to your side while in a flat bed without using bedrails?: A Lot Help needed moving from lying on your back to sitting on the side of a flat bed without using bedrails?: A Lot Help needed moving to and from a bed to a chair (including a wheelchair)?: A Lot Help needed standing up from a chair using your arms (e.g., wheelchair or bedside chair)?: A Lot Help needed to walk in hospital room?: A Lot Help needed climbing 3-5 steps with a railing? : A Lot 6 Click Score: 12    End of Session Equipment Utilized During Treatment: Gait belt Activity Tolerance: Patient tolerated treatment well Patient left: in bed;with call bell/phone within reach;with chair alarm set;with nursing/sitter in room Nurse Communication: Mobility status PT Visit Diagnosis: Muscle weakness (generalized) (M62.81);Difficulty in walking, not elsewhere classified (R26.2)     Time: 4098-1191 PT Time Calculation (min) (ACUTE ONLY):  22 min  Charges:    $Therapeutic Activity: 8-22 mins PT General Charges $$ ACUTE PT VISIT: 1 Visit                     Jetta Lout PTA 10/26/23, 8:22 AM

## 2023-10-26 NOTE — Plan of Care (Signed)
  Problem: Clinical Measurements: Goal: Will remain free from infection Outcome: Progressing   Problem: Clinical Measurements: Goal: Respiratory complications will improve Outcome: Progressing   Problem: Clinical Measurements: Goal: Cardiovascular complication will be avoided Outcome: Progressing   Problem: Activity: Goal: Risk for activity intolerance will decrease Outcome: Progressing   Problem: Nutrition: Goal: Adequate nutrition will be maintained Outcome: Progressing   Problem: Elimination: Goal: Will not experience complications related to bowel motility Outcome: Progressing   Problem: Elimination: Goal: Will not experience complications related to urinary retention Outcome: Progressing   Problem: Pain Managment: Goal: General experience of comfort will improve and/or be controlled Outcome: Progressing   Problem: Safety: Goal: Ability to remain free from injury will improve Outcome: Progressing

## 2023-10-26 NOTE — Plan of Care (Signed)

## 2023-10-26 NOTE — TOC Progression Note (Signed)
Transition of Care [facility]) - Progression Note    Patient Details  Name: Barbara Frank MRN: [id number] Date of Birth: June 14, 1946  Transition of [facility]) CM/SW Contact  Marlowe Sax, RN Phone Number: 10/26/2023, 8:41 AM  Clinical Narrative:    Reached out to [facility] and requested Ins Berkley Harvey, it is pending to go to [facility] [id number]        Expected Discharge Plan and Services                                               Social Determinants of Health (SDOH) Interventions SDOH Screenings   Food Insecurity: Patient Unable To Answer (10/19/2023)  Housing: Patient Unable To Answer (10/19/2023)  Transportation Needs: Patient Unable To Answer (10/19/2023)  Utilities: Patient Unable To Answer (10/19/2023)  Social Connections: Patient Unable To Answer (10/19/2023)  Tobacco Use: Low Risk  (10/19/2023)    Readmission Risk Interventions     No data to display

## 2023-10-26 NOTE — Progress Notes (Signed)
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  Patient also has a history of paroxysmal atrial fibrillation on Eliquis, diabetes, hypertension presented with new onset cough and shortness of breath and a fall.  Symptoms started about a week ago with a dry cough.  She is also sore on her abdomen from all the coughing.  She has been taking laxatives but now has diarrhea.   CT scan of the chest showed a small pulmonary embolism, left internal jugular DVT, enlarging right renal mass and metastatic lesion to the rib with pneumonia versus metastasis in the chest.  Patient started on antibiotics.  Patient also started on Cardizem drip for rapid atrial fibrillation.   2/12.  Add oral Cardizem and oral Coreg to try to taper off Cardizem drip.  Continue antibiotics. 2/13.  Patient lethargic after pain medication. 2/14.  CT scan negative. 2/15.  Patient did not sleep very well last night.  Will order as needed Seroquel if does not sleep tonight. 2/16.  Slept better last night.  Not having any pain.  Able to straight leg raise today.  Mental status improved. 2/17.  More confused today than yesterday.  Yesterday was a good day. 2/18.  Patient had a better day today.  More alert and able to follow commands and straight leg raise.  Replaced IV magnesium.  Still awaiting a bed offer  Assessment & Plan:   Principal Problem:   Lethargy Active Problems:   Paroxysmal atrial fibrillation with rapid ventricular response (HCC)   Acute pulmonary embolism (HCC)   Multifocal pneumonia   Renal cancer, right (HCC)   Hypomagnesemia   Hyponatremia   Uncontrolled type 2 diabetes mellitus with hyperglycemia, with long-term current use of insulin (HCC)   Overweight (BMI  25.0-29.9)   Depression   Diarrhea   Closed fracture of proximal end of right humerus   Rapid atrial fibrillation (HCC)  Lethargy Mental status better today.  She has had waxing and waning mental status during the hospital course.  CT head on 2/14 showed no evidence of acute intracranial abnormality, right scalp anterior hematoma, cerebral atrophy and chronic small vessel ischemic change.  Continue with as needed Seroquel nightly   Paroxysmal atrial fibrillation with rapid ventricular response (HCC) Patient tapered off Cardizem drip on 10/19/2023.  Patient on increased dose of Coreg and Cardizem CD.  Patient on Xarelto for anticoagulation.  Heart rate stable.   Acute pulmonary embolism [facility]) Oncology recommending changing Eliquis to Xarelto.     Multifocal pneumonia Completed a full course of antibiotics.  Lung sounds improved   Renal cancer, right (HCC) Right renal mass enlarging in size.  Mets to lung and bone.  Patient is a DNR.  In order to go to rehab they rescinded hospice.   Hypomagnesemia Monitor and replace as needed   Hyponatremia Last sodium normal range   Uncontrolled type 2 diabetes mellitus with hyperglycemia, with long-term current use of insulin (HCC) Hemoglobin A1c 11.1.  Decreased to 23 units of Semglee insulin and sliding scale.  Holding metformin.    Overweight (BMI 25.0-29.9) BMI 29.27   Closed fracture of proximal end of right humerus Sling.  Oral pain medications only.  Not a candidate for surgery.  Refer to orthopedic as outpatient.   Diarrhea On admission stool comprehensive panel  negative and C. difficile negative.   Depression On Lexapro      DVT prophylaxis: Xarelto Code Status: DNR Family Communication: None Disposition Plan: Status is: Inpatient Remains inpatient appropriate because: Awaiting bed offer.  Medically stable for discharge to skilled nursing facility.   Level of care: Telemetry Medical  Consultants:  None  Procedures:   None  Antimicrobials: None    Subjective: Seen and examined.  Sitting up in chair.  No distress  Objective: Vitals:   10/25/23 2026 10/25/23 2352 10/26/23 0735 10/26/23 0803  BP:  123/70  135/86  Pulse:  88 87 89  Resp:  20 18 16   Temp:  98.2 F (36.8 C)  98.4 F (36.9 C)  TempSrc:  Oral  Oral  SpO2: 96% 95% 94% 96%  Weight:      Height:        Intake/Output Summary (Last 24 hours) at 10/26/2023 1239 Last data filed at 10/25/2023 2200 Gross per 24 hour  Intake 240 ml  Output --  Net 240 ml   Filed Weights   10/18/23 0918  Weight: 72.6 kg    Examination:  General exam: Appears fatigued Respiratory system: Clear to auscultation. Respiratory effort normal. Cardiovascular system: S1-S2, RRR, no murmurs, no pedal edema Gastrointestinal system: Soft, NT/ND, normal bowel sounds Central nervous system: Alert.  Oriented x 2.  Confused.  No focal deficits Extremities: Symmetrically decreased power bilaterally Skin: No rashes, lesions or ulcers Psychiatry: Judgement and insight appear impaired. Mood & affect flattened.     Data Reviewed: I have personally reviewed following labs and imaging studies  CBC: Recent Labs  Lab 10/21/23 0522 10/25/23 0625  WBC 11.9* 10.5  NEUTROABS  --  7.7  HGB 10.9* 11.2*  HCT 35.0* 36.9  MCV 81.6 82.6  PLT 350 346   Basic Metabolic Panel: Recent Labs  Lab 10/19/23 2151 10/21/23 0522 10/25/23 0625  NA 133* 135 137  K 3.5 3.6 4.3  CL 100 104 103  CO2 24 23 25   GLUCOSE 227* 214* 131*  BUN 17 24* 18  CREATININE 0.62 0.54 0.50  CALCIUM 9.8 9.8 10.0  MG  --   --  1.6*   GFR: Estimated Creatinine Clearance: 55.8 mL/min (by C-G formula based on SCr of 0.5 mg/dL). Liver Function Tests: No results for input(s): "AST", "ALT", "ALKPHOS", "BILITOT", "PROT", "ALBUMIN" in the last 168 hours. No results for input(s): "LIPASE", "AMYLASE" in the last 168 hours. No results for input(s): "AMMONIA" in the last 168 hours. Coagulation  Profile: No results for input(s): "INR", "PROTIME" in the last 168 hours. Cardiac Enzymes: No results for input(s): "CKTOTAL", "CKMB", "CKMBINDEX", "TROPONINI" in the last 168 hours. BNP (last 3 results) No results for input(s): "PROBNP" in the last 8760 hours. HbA1C: No results for input(s): "HGBA1C" in the last 72 hours. CBG: Recent Labs  Lab 10/25/23 1123 10/25/23 1635 10/25/23 2121 10/26/23 0803 10/26/23 1137  GLUCAP 193* 239* 236* 183* 243*   Lipid Profile: No results for input(s): "CHOL", "HDL", "LDLCALC", "TRIG", "CHOLHDL", "LDLDIRECT" in the last 72 hours. Thyroid Function Tests: No results for input(s): "TSH", "T4TOTAL", "FREET4", "T3FREE", "THYROIDAB" in the last 72 hours. Anemia Panel: No results for input(s): "VITAMINB12", "FOLATE", "FERRITIN", "TIBC", "IRON", "RETICCTPCT" in the last 72 hours. Sepsis Labs: No results for input(s): "PROCALCITON", "LATICACIDVEN" in the last 168 hours.  Recent Results (from the past 240 hours)  Blood culture (single)     Status: None   Collection Time: 10/18/23 11:00 AM   Specimen: BLOOD  Result Value Ref Range Status   Specimen Description BLOOD LEFT ANTECUBITAL  Final   Special Requests   Final    BOTTLES DRAWN AEROBIC AND ANAEROBIC Blood Culture results may not be optimal due to an inadequate volume of blood received in culture bottles   Culture   Final    NO GROWTH 5 DAYS Performed at [facility], [address] [place], [place] [address]    Report Status 10/23/2023 FINAL  Final  Gastrointestinal Panel by PCR , Stool     Status: None   Collection Time: 10/18/23  5:34 PM   Specimen: Stool  Result Value Ref Range Status   Campylobacter species NOT DETECTED NOT DETECTED Final   Plesimonas shigelloides NOT DETECTED NOT DETECTED Final   Salmonella species NOT DETECTED NOT DETECTED Final   Yersinia enterocolitica NOT DETECTED NOT DETECTED Final   Vibrio species NOT DETECTED NOT DETECTED Final   Vibrio cholerae NOT  DETECTED NOT DETECTED Final   Enteroaggregative E coli (EAEC) NOT DETECTED NOT DETECTED Final   Enteropathogenic E coli (EPEC) NOT DETECTED NOT DETECTED Final   Enterotoxigenic E coli (ETEC) NOT DETECTED NOT DETECTED Final   Shiga like toxin producing E coli (STEC) NOT DETECTED NOT DETECTED Final   Shigella/Enteroinvasive E coli (EIEC) NOT DETECTED NOT DETECTED Final   Cryptosporidium NOT DETECTED NOT DETECTED Final   Cyclospora cayetanensis NOT DETECTED NOT DETECTED Final   Entamoeba histolytica NOT DETECTED NOT DETECTED Final   Giardia lamblia NOT DETECTED NOT DETECTED Final   Adenovirus F40/41 NOT DETECTED NOT DETECTED Final   Astrovirus NOT DETECTED NOT DETECTED Final   Norovirus GI/GII NOT DETECTED NOT DETECTED Final   Rotavirus A NOT DETECTED NOT DETECTED Final   Sapovirus (I, II, IV, and V) NOT DETECTED NOT DETECTED Final    Comment: Performed at [facility], [address] [place], [place] [address]  C Difficile Quick Screen w PCR reflex     Status: None   Collection Time: 10/18/23  5:34 PM   Specimen: STOOL  Result Value Ref Range Status   C Diff antigen NEGATIVE NEGATIVE Final   C Diff toxin NEGATIVE NEGATIVE Final   C Diff interpretation No C. difficile detected.  Final    Comment: Performed at [facility], [address], [place], [place] [address]         Radiology Studies: No results found.      Scheduled Meds:  acetaminophen  650 mg Oral TID   acidophilus  2 capsule Oral TID   carvedilol  12.5 mg Oral BID WC   diltiazem  240 mg Oral QPM   escitalopram  10 mg Oral Daily   famotidine  40 mg Oral Daily   feeding supplement (GLUCERNA SHAKE)  237 mL Oral TID BM   guaiFENesin  1,200 mg Oral BID   insulin aspart  0-15 Units Subcutaneous TID WC   insulin aspart  0-5 Units Subcutaneous QHS   insulin glargine-yfgn  23 Units Subcutaneous Daily   miconazole  1 Application Topical BID   polyethylene glycol  17 g Oral Daily   Rivaroxaban  15  mg Oral BID WC   Followed by   Melene Muller ON 11/08/2023] rivaroxaban  20 mg Oral Q supper   Continuous Infusions:   LOS: 8 days    Tresa Moore, MD [facility]   If 7PM-7AM, please contact night-coverage  10/26/2023, 12:39 PM

## 2023-10-26 NOTE — TOC Progression Note (Signed)
Transition of Care [facility]) - Progression Note    Patient Details  Name: Barbara Frank MRN: [id number] Date of Birth: 07/10/1946  [facility]) CM/SW Contact  Marlowe Sax, RN Phone Number: 10/26/2023, 1:26 PM  Clinical Narrative:    Sherron Monday with Duanne Guess the POA I pulled up Ins to see  status of request, it is approved, [id number]  [facility] will accept the patient tomorrow   Expected Discharge Plan: Skilled Nursing Facility Barriers to Discharge: Insurance Authorization  Expected Discharge Plan and Services   Discharge Planning Services: CM Consult                                           Social Determinants of Health (SDOH) Interventions SDOH Screenings   Food Insecurity: Patient Unable To Answer (10/19/2023)  Housing: Patient Unable To Answer (10/19/2023)  Transportation Needs: Patient Unable To Answer (10/19/2023)  Utilities: Patient Unable To Answer (10/19/2023)  Social Connections: Patient Unable To Answer (10/19/2023)  Tobacco Use: Low Risk  (10/19/2023)    Readmission Risk Interventions     No data to display

## 2023-10-27 DIAGNOSIS — R5383 Other fatigue: Secondary | ICD-10-CM | POA: Diagnosis not present

## 2023-10-27 LAB — GLUCOSE, CAPILLARY
Glucose-Capillary: 84 mg/dL (ref 70–99)
Glucose-Capillary: 162 mg/dL — ABNORMAL HIGH (ref 70–99)

## 2023-10-27 MED ORDER — RIVAROXABAN 2.5 MG PO TABS: ORAL_TABLET | ORAL | Status: DC

## 2023-10-27 MED ORDER — RIVAROXABAN 20 MG PO TABS: 20.0000 mg | ORAL_TABLET | Freq: Every day | ORAL | Status: DC

## 2023-10-27 MED ORDER — RIVAROXABAN 15 MG PO TABS
15.0000 mg | ORAL_TABLET | Freq: Two times a day (BID) | ORAL | Status: AC
Start: 2023-10-27 — End: 2023-11-09

## 2023-10-27 NOTE — TOC Progression Note (Signed)
Transition of Care [facility]) - Progression Note    Patient Details  Name: Barbara Frank MRN: [id number] Date of Birth: Oct 06, 1945  Transition of Care [facility]) CM/SW Contact  Marlowe Sax, RN Phone Number: 10/27/2023, 11:32 AM  Clinical Narrative:     Sherron Monday to Duanne Guess the pOA and notfied him that she will dc to [facility] room 5 [facility] EMS called to arrange transport  Expected Discharge Plan: Skilled Nursing Facility Barriers to Discharge: Insurance Authorization  Expected Discharge Plan and Services   Discharge Planning Services: CM Consult     Expected Discharge Date: 10/27/23                                     Social Determinants of Health (SDOH) Interventions SDOH Screenings   Food Insecurity: Patient Unable To Answer (10/19/2023)  Housing: Patient Unable To Answer (10/19/2023)  Transportation Needs: Patient Unable To Answer (10/19/2023)  Utilities: Patient Unable To Answer (10/19/2023)  Social Connections: Patient Unable To Answer (10/19/2023)  Tobacco Use: Low Risk  (10/19/2023)    Readmission Risk Interventions     No data to display

## 2023-10-27 NOTE — Progress Notes (Signed)
Gave report to Nurse April at [facility]. Nurse acknowledged understanding. Patient transported via EMS.

## 2023-10-27 NOTE — Discharge Summary (Addendum)
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  Patient also has a history of paroxysmal atrial fibrillation on Eliquis, diabetes, hypertension presented with new onset cough and shortness of breath and a fall.  Symptoms started about a week ago with a dry cough.  She is also sore on her abdomen from all the coughing.  She has been taking laxatives but now has diarrhea.   CT scan of the chest showed a small pulmonary embolism, left internal jugular DVT, enlarging right renal mass and metastatic lesion to the rib with pneumonia versus metastasis in the chest.  Patient started on antibiotics.  Patient also started on Cardizem drip for rapid atrial fibrillation.   2/12.  Add oral Cardizem and oral Coreg to try to taper off Cardizem drip.  Continue antibiotics. 2/13.  Patient lethargic after pain medication. 2/14.  CT scan negative. 2/15.  Patient did not sleep very well last night.  Will order as needed Seroquel if does not sleep tonight. 2/16.  Slept better last night.  Not having any pain.  Able to straight leg raise today.  Mental status improved. 2/17.  More confused today than yesterday.  Yesterday was a good day. 2/18.  Patient had a better day today.  More alert and able to follow commands and straight leg raise.  Replaced IV magnesium.  Still awaiting a bed offer 2/20: Bed offer accepted at [facility]    Discharge Diagnoses:  Principal Problem:    Lethargy Active Problems:   Paroxysmal atrial fibrillation with rapid ventricular response (HCC)   Acute pulmonary embolism (HCC)   Multifocal pneumonia   Renal cancer, right (HCC)   Hypomagnesemia   Hyponatremia   Uncontrolled type 2 diabetes mellitus with hyperglycemia, with long-term current use of insulin (HCC)   Overweight (BMI 25.0-29.9)   Depression   Diarrhea   Closed fracture of proximal end of right humerus   Rapid atrial fibrillation (HCC) Lethargy Mental status better today.  She has had waxing and waning mental status during the hospital course.  CT head on 2/14 showed no evidence of acute intracranial abnormality, right scalp anterior hematoma, cerebral atrophy and chronic small vessel ischemic change.     Paroxysmal atrial fibrillation with rapid ventricular response (HCC) Patient tapered off Cardizem drip on 10/19/2023.  Patient on increased dose of Coreg and Cardizem CD.  Patient on Xarelto for anticoagulation.  Heart rate stable.   Acute pulmonary embolism [facility]) Oncology recommending changing Eliquis to Xarelto.  Reflected on DC MAR   Multifocal pneumonia Completed a full course of antibiotics.  Lung sounds improved   Renal cancer, right (HCC) Right renal mass enlarging in size.  Mets to lung and bone.  Patient is a DNR.  In order to go to rehab they rescinded hospice.   Closed fracture of proximal end of right humerus Sling.  Oral pain medications only.  Not a candidate for surgery.  Refer to orthopedic as outpatient.   Depression On Lexapro   Discharge Instructions  Discharge Instructions     Diet - low sodium heart  healthy   Complete by: As directed    Increase activity slowly   Complete by: As directed       Allergies as of 10/27/2023       Reactions   Kiwi Extract         Medication List     STOP taking these medications    apixaban 5 MG Tabs tablet Commonly known as: ELIQUIS   insulin glargine 100 UNIT/ML injection Commonly known  as: LANTUS   metoprolol succinate 25 MG 24 hr tablet Commonly known as: TOPROL-XL   nystatin powder Commonly known as: MYCOSTATIN/NYSTOP       TAKE these medications    acetaminophen 325 MG tablet Commonly known as: TYLENOL Take 650 mg by mouth 3 (three) times daily.   bisacodyl 10 MG suppository Commonly known as: DULCOLAX Place 10 mg rectally daily as needed for moderate constipation. Insert 1 supp per rectum once daily as needed for constipation   carvedilol 6.25 MG tablet Commonly known as: COREG Take 6.25 mg by mouth 2 (two) times daily with a meal.   cetirizine 5 MG tablet Commonly known as: ZYRTEC Take 5 mg by mouth daily. What changed: Another medication with the same name was removed. Continue taking this medication, and follow the directions you see here.   cyanocobalamin 1000 MCG tablet Take 1,000 mcg by mouth daily.   diltiazem 240 MG 24 hr capsule Commonly known as: CARDIZEM CD Take 1 capsule (240 mg total) by mouth every evening.   escitalopram 10 MG tablet Commonly known as: LEXAPRO Take 10 mg by mouth daily.   famotidine 40 MG tablet Commonly known as: PEPCID Take 40 mg by mouth daily.   guaifenesin 100 MG/5ML syrup Commonly known as: ROBITUSSIN Take 200 mg by mouth 4 (four) times daily as needed for cough.   guaifenesin 100 MG/5ML syrup Commonly known as: ROBITUSSIN Take 200 mg by mouth in the morning, at noon, in the evening, and at bedtime.   magnesium hydroxide 400 MG/5ML suspension Commonly known as: MILK OF MAGNESIA Take 30 mLs by mouth daily as needed for mild constipation.   melatonin 3 MG Tabs tablet Take 3 mg by mouth at bedtime.   metFORMIN 500 MG 24 hr tablet Commonly known as: GLUCOPHAGE-XR Take 1 tablet (500 mg total) by mouth 2 (two) times daily. What changed:  how much to take when to take this   miconazole 2 % powder Commonly known as: MICOTIN Apply 1 Application topically in the morning and at bedtime. Apply  topically to affected areas of redness under abdominal folds and breast twice daily until healed.   ondansetron 8 MG tablet Commonly known as: ZOFRAN Take 1 tablet (8 mg total) by mouth every 8 (eight) hours as needed for nausea or vomiting.   polyethylene glycol 17 g packet Commonly known as: MIRALAX / GLYCOLAX Take 17 g by mouth daily as needed.   Psyllium 400 MG Caps Take 400 mg by mouth daily.   Rivaroxaban 15 MG Tabs tablet Commonly known as: XARELTO Take 1 tablet (15 mg total) by mouth 2 (two) times daily with a meal for 13 days.   rivaroxaban 20 MG Tabs tablet Commonly known as: XARELTO Take 1 tablet (20 mg total) by mouth daily with supper. Start taking on: November 10, 2023   Semglee (yfgn) 100 UNIT/ML Pen Generic drug: insulin glargine-yfgn Inject 23 Units into the skin daily. What changed: Another medication with the same name was removed. Continue taking this medication, and follow  the directions you see here.   senna 8.6 MG Tabs tablet Commonly known as: SENOKOT Take 1 tablet by mouth every Monday, Wednesday, and Friday. Oral at bedtime, every week on Monday, Wednesday and Friday   simethicone 80 MG chewable tablet Commonly known as: MYLICON Chew 80 mg by mouth every 8 (eight) hours as needed for flatulence.   Systane 0.4-0.3 % Soln Generic drug: Polyethyl Glycol-Propyl Glycol Apply 1 drop to eye 2 (two) times daily as needed.   TechLite Pen Needles 31G X 5 MM Misc Generic drug: Insulin Pen Needle use with semglee   Insupen Pen Needles 31G X 5 MM Misc Generic drug: Insulin Pen Needle use with semglee        Contact information for follow-up providers     Reinaldo Berber, MD Follow up in 2 week(s).   Specialty: Orthopedic Surgery Why: right humerus fracture Contact information: [address] [place] [address] [phone]              Contact information for after-discharge care     Destination     [facility] .    Service: Skilled Nursing Contact information: [address] [place] [address] [phone]                    Allergies  Allergen Reactions   Kiwi Extract     Consultations: Orthopedics   Procedures/Studies: CT HEAD WO CONTRAST ( ) Result Date: 10/21/2023 CLINICAL DATA:  Provided history: Mental status change, unknown cause. EXAM: CT HEAD WITHOUT CONTRAST TECHNIQUE: Contiguous axial images were obtained from the base of the skull through the vertex without intravenous contrast. RADIATION DOSE REDUCTION: This exam was performed according to the departmental dose-optimization program which includes automated exposure control, adjustment of the mA and/or kV according to patient size and/or use of iterative reconstruction technique. COMPARISON:  Noncontrast head CT and CT angiogram head/neck 10/18/2023. Brain MRI 07/26/2022. FINDINGS: Brain: Mild generalized cerebral atrophy. Patchy and ill-defined hypoattenuation within the cerebral white matter, nonspecific but compatible with mild chronic small vessel ischemic disease. There is no acute intracranial hemorrhage. No demarcated cortical infarct. No extra-axial fluid collection. No evidence of an intracranial mass. No midline shift. Vascular: No hyperdense vessel.  Atherosclerotic calcifications. Skull: No mass or acute finding within the imaged orbits. Sinuses/Orbits: No mass or acute finding within the imaged orbits. Pansinusitis. Other: Right anterior scalp hematoma. IMPRESSION: 1.  No evidence of an acute intracranial abnormality. 2. Right anterior scalp hematoma. 3. 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 4. Pansinusitis. Electronically Signed   By: Jackey Loge D.O.   On: 10/21/2023 13:30   US Venous Img Lower Bilateral (DVT) Result Date: 10/18/2023 CLINICAL DATA:  Pulmonary emboli EXAM: Bilateral LOWER EXTREMITY VENOUS DOPPLER ULTRASOUND TECHNIQUE: Gray-scale sonography with compression, as  well as color and duplex ultrasound, were performed to evaluate the deep venous system(s) from the level of the common femoral vein through the popliteal and proximal calf veins. COMPARISON:  Lower extremity ultrasound 03/16/2021 FINDINGS: VENOUS Normal compressibility of the common femoral, superficial femoral, and popliteal veins, as well as the visualized calf veins. Visualized portions of profunda femoral vein and great saphenous vein unremarkable. No filling defects to suggest DVT on grayscale or color Doppler imaging. Doppler waveforms show normal direction of venous flow, normal respiratory plasticity and response to augmentation. Limited views of the contralateral common femoral vein are unremarkable. OTHER None. Limitations: none IMPRESSION: Negative. Electronically Signed   By: Joselyn Glassman  Stutzman M.D.   On: 10/18/2023 23:09   CT Angio Chest Pulmonary Embolism (PE) W or WO Contrast Result Date: 10/18/2023 CLINICAL DATA:  Pulmonary embolism (PE) suspected, high prob; Abdominal trauma, blunt. Unwitnessed fall. History of metastatic right renal cell carcinoma. * Tracking Code: BO * EXAM: CT ANGIOGRAPHY CHEST CT ABDOMEN AND PELVIS WITH CONTRAST TECHNIQUE: Multidetector CT imaging of the chest was performed using the standard protocol during bolus administration of intravenous contrast. Multiplanar CT image reconstructions and MIPs were obtained to evaluate the vascular anatomy. Multidetector CT imaging of the abdomen and pelvis was performed using the standard protocol during bolus administration of intravenous contrast. RADIATION DOSE REDUCTION: This exam was performed according to the departmental dose-optimization program which includes automated exposure control, adjustment of the mA and/or kV according to patient size and/or use of iterative reconstruction technique. CONTRAST:  OMNIPAQUE IOHEXOL 350 MG/ML SOLN COMPARISON:  CT scan chest, abdomen and pelvis from 03/22/2022. FINDINGS: CTA CHEST  FINDINGS Cardiovascular: There is small volume occlusive and nonocclusive embolism in the segmental pulmonary artery branch in the right lung lower lobe with extension into the subsegmental branch. No right heart strain. No lung infarction. Mild cardiomegaly. No pericardial effusion. No aortic aneurysm. There are coronary artery calcifications, in keeping with coronary artery disease. There are also mild peripheral atherosclerotic vascular calcifications of thoracic aorta and its major branches. There is dilation of the main pulmonary trunk measuring up to 3.4 cm, which is nonspecific but can be seen with pulmonary artery hypertension. Mediastinum/Nodes: Visualized thyroid gland appears grossly unremarkable. No solid / cystic mediastinal masses. The esophagus is nondistended precluding optimal assessment. There are multiple enlarged mediastinal and bilateral hilar lymph nodes with largest in the right lower paratracheal region measuring up to 1.6 x 3.2 cm. These are indeterminate in etiology but new since the prior study from 07/20/2022. No axillary lymphadenopathy by size criteria. Lungs/Pleura: The central tracheo-bronchial tree is patent. There are heterogeneous opacities bilaterally, predominantly in the perihilar and peribronchovascular region, asymmetrically more in the right upper lobe and left lower lobe. There is also small right pleural effusion. No left pleural effusion. No pneumothorax on either side. Musculoskeletal: The visualized soft tissues of the chest wall are grossly unremarkable. There is expansile lytic lesion in the posterior right third rib with associated bone destruction and soft tissue component, compatible with metastases. No acute vertebral compression fracture or acute displaced rib fracture multiple left old healed rib fractures noted. There are mild multilevel degenerative changes in the visualized spine. Review of the MIP images confirms the above findings. CT ABDOMEN and PELVIS  FINDINGS Hepatobiliary: The liver is normal in size. Non-cirrhotic configuration. No suspicious mass. No intrahepatic or extrahepatic bile duct dilation. Gallbladder is surgically absent. Pancreas: Unremarkable. No pancreatic ductal dilatation or surrounding inflammatory changes. Spleen: Within normal limits. No focal lesion. Adrenals/Urinary Tract: Adrenal glands are unremarkable. There is a large heterogeneous mass in the right kidney currently measuring 7.4 x 9.8 x 10.1 cm, significantly increased since the prior study and compatible with patient's known history of right renal cell carcinoma. There are multiple right perirenal/retrocaval lymph nodes with largest measuring up to 2.3 x 2.5 cm, compatible with metastases. Unremarkable left kidney. No left nephroureterolithiasis or obstructive uropathy. Unremarkable urinary bladder. 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 Redemonstration of nonspecific hyperattenuating soft tissue along the left wall of the duodenal bulb, which may represent metastatic versus primary neoplastic process. No disproportionate dilation of the small or large bowel loops. No evidence of  abnormal bowel wall thickening or inflammatory changes. The appendix is unremarkable. Vascular/Lymphatic: No ascites or pneumoperitoneum. Redemonstration of scattered calcifications throughout the abdomen. Interval enlargement of the pre caval lymph node (series 16, image 38), compatible with metastases. No aneurysmal dilation of the major abdominal arteries. There are mild peripheral atherosclerotic vascular calcifications of the aorta and its major branches. Reproductive: Normal-size anteverted uterus. 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 Redemonstration of a complex mass inseparable from the right side of the fundus of the uterus, grossly similar to the prior study. Other: The  visualized soft tissues and abdominal wall are unremarkable. Musculoskeletal: No suspicious osseous lesions. There are mild - moderate multilevel degenerative changes in the visualized spine. Review of the MIP images confirms the above findings. IMPRESSION: 1. Small volume occlusive and nonocclusive embolism in the segmental pulmonary artery branch in the right lung lower lobe, as described above. No right heart strain or lung infarction. 2. Multiple enlarged mediastinal and bilateral hilar lymph nodes, indeterminate. 3. Heterogeneous opacities mainly in the perihilar/peribronchovascular region. Differential diagnosis includes multi lobar pneumonia versus metastatic deposits. Follow-up to clearing is recommended. 4. New expansile lytic lesion of the posterior right third rib with associated bone destruction and soft tissue, compatible with metastases. 5. Interval increase in the large heterogeneous right renal mass, compatible with patient's known history of renal cell carcinoma. 6. Interval increase in the multiple retroperitoneal/right perirenal lymph nodes, compatible with metastases. 7. Multiple other nonemergent observations, as described above. Aortic Atherosclerosis (ICD10-I70.0). Critical Value/emergent results were called by telephone at the time of interpretation on 10/18/2023 at 3:13 pm to provider [employer] [facility] , who verbally acknowledged these results. Electronically Signed   By: Jules Schick M.D.   On: 10/18/2023 15:13   CT ABDOMEN PELVIS W CONTRAST Result Date: 10/18/2023 CLINICAL DATA:  Pulmonary embolism (PE) suspected, high prob; Abdominal trauma, blunt. Unwitnessed fall. History of metastatic right renal cell carcinoma. * Tracking Code: BO * EXAM: CT ANGIOGRAPHY CHEST CT ABDOMEN AND PELVIS WITH CONTRAST TECHNIQUE: Multidetector CT imaging of the chest was performed using the standard protocol during bolus administration of intravenous contrast. Multiplanar CT image reconstructions and MIPs  were obtained to evaluate the vascular anatomy. Multidetector CT imaging of the abdomen and pelvis was performed using the standard protocol during bolus administration of intravenous contrast. RADIATION DOSE REDUCTION: This exam was performed according to the departmental dose-optimization program which includes automated exposure control, adjustment of the mA and/or kV according to patient size and/or use of iterative reconstruction technique. CONTRAST:  OMNIPAQUE IOHEXOL 350 MG/ML SOLN COMPARISON:  CT scan chest, abdomen and pelvis from 03/22/2022. FINDINGS: CTA CHEST FINDINGS Cardiovascular: There is small volume occlusive and nonocclusive embolism in the segmental pulmonary artery branch in the right lung lower lobe with extension into the subsegmental branch. No right heart strain. No lung infarction. Mild cardiomegaly. No pericardial effusion. No aortic aneurysm. There are coronary artery calcifications, in keeping with coronary artery disease. There are also mild peripheral atherosclerotic vascular calcifications of thoracic aorta and its major branches. There is dilation of the main pulmonary trunk measuring up to 3.4 cm, which is nonspecific but can be seen with pulmonary artery hypertension. Mediastinum/Nodes: Visualized thyroid gland appears grossly unremarkable. No solid / cystic mediastinal masses. The esophagus is nondistended precluding optimal assessment. There are multiple enlarged mediastinal and bilateral hilar lymph nodes with largest in the right lower paratracheal region measuring up to 1.6 x 3.2  cm. These are indeterminate in etiology but new since the prior study from 07/20/2022. No axillary lymphadenopathy by size criteria. Lungs/Pleura: The central tracheo-bronchial tree is patent. There are heterogeneous opacities bilaterally, predominantly in the perihilar and peribronchovascular region, asymmetrically more in the right upper lobe and left lower lobe. There is also small right  pleural effusion. No left pleural effusion. No pneumothorax on either side. Musculoskeletal: The visualized soft tissues of the chest wall are grossly unremarkable. There is expansile lytic lesion in the posterior right third rib with associated bone destruction and soft tissue component, compatible with metastases. No acute vertebral compression fracture or acute displaced rib fracture multiple left old healed rib fractures noted. There are mild multilevel degenerative changes in the visualized spine. Review of the MIP images confirms the above findings. CT ABDOMEN and PELVIS FINDINGS Hepatobiliary: The liver is normal in size. Non-cirrhotic configuration. No suspicious mass. No intrahepatic or extrahepatic bile duct dilation. Gallbladder is surgically absent. Pancreas: Unremarkable. No pancreatic ductal dilatation or surrounding inflammatory changes. Spleen: Within normal limits. No focal lesion. Adrenals/Urinary Tract: Adrenal glands are unremarkable. There is a large heterogeneous mass in the right kidney currently measuring 7.4 x 9.8 x 10.1 cm, significantly increased since the prior study and compatible with patient's known history of right renal cell carcinoma. There are multiple right perirenal/retrocaval lymph nodes with largest measuring up to 2.3 x 2.5 cm, compatible with metastases. Unremarkable left kidney. No left nephroureterolithiasis or obstructive uropathy. Unremarkable urinary bladder. 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 Redemonstration of nonspecific hyperattenuating soft tissue along the left wall of the duodenal bulb, which may represent metastatic versus primary neoplastic process. No disproportionate dilation of the small or large bowel loops. No evidence of abnormal bowel wall thickening or inflammatory changes. The appendix is unremarkable. Vascular/Lymphatic: No ascites or pneumoperitoneum. Redemonstration of scattered calcifications throughout the abdomen.  Interval enlargement of the pre caval lymph node (series 16, image 38), compatible with metastases. No aneurysmal dilation of the major abdominal arteries. There are mild peripheral atherosclerotic vascular calcifications of the aorta and its major branches. Reproductive: Normal-size anteverted uterus. 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 Redemonstration of a complex mass inseparable from the right side of the fundus of the uterus, grossly similar to the prior study. Other: The visualized soft tissues and abdominal wall are unremarkable. Musculoskeletal: No suspicious osseous lesions. There are mild - moderate multilevel degenerative changes in the visualized spine. Review of the MIP images confirms the above findings. IMPRESSION: 1. Small volume occlusive and nonocclusive embolism in the segmental pulmonary artery branch in the right lung lower lobe, as described above. No right heart strain or lung infarction. 2. Multiple enlarged mediastinal and bilateral hilar lymph nodes, indeterminate. 3. Heterogeneous opacities mainly in the perihilar/peribronchovascular region. Differential diagnosis includes multi lobar pneumonia versus metastatic deposits. Follow-up to clearing is recommended. 4. New expansile lytic lesion of the posterior right third rib with associated bone destruction and soft tissue, compatible with metastases. 5. Interval increase in the large heterogeneous right renal mass, compatible with patient's known history of renal cell carcinoma. 6. Interval increase in the multiple retroperitoneal/right perirenal lymph nodes, compatible with metastases. 7. Multiple other nonemergent observations, as described above. Aortic Atherosclerosis (ICD10-I70.0). Critical Value/emergent results were called by telephone at the time of interpretation on 10/18/2023 at 3:13 pm to provider [facility] , who verbally  acknowledged these results. Electronically Signed   By: Rhea Belton  Ramiro Harvest M.D.   On: 10/18/2023 15:13   CT Angio Head Neck W WO CM Result Date: 10/18/2023 CLINICAL DATA:  Stroke/TIA, determine embolic source fall, ? thrombus on CT wo con EXAM: CT ANGIOGRAPHY HEAD AND NECK WITH AND WITHOUT CONTRAST TECHNIQUE: Multidetector CT imaging of the head and neck was performed using the standard protocol during bolus administration of intravenous contrast. Multiplanar CT image reconstructions and MIPs were obtained to evaluate the vascular anatomy. Carotid stenosis measurements (when applicable) are obtained utilizing NASCET criteria, using the distal internal carotid diameter as the denominator. RADIATION DOSE REDUCTION: This exam was performed according to the departmental dose-optimization program which includes automated exposure control, adjustment of the mA and/or kV according to patient size and/or use of iterative reconstruction technique. CONTRAST:  OMNIPAQUE IOHEXOL 350 MG/ML SOLN COMPARISON:  Same-day CT head and cervical spine FINDINGS: CT HEAD FINDINGS See same day CT head for intracranial findings. CTA NECK FINDINGS Aortic arch: Standard branching. Imaged portion shows no evidence of aneurysm or dissection. No significant stenosis of the major arch vessel origins. Right carotid system: No evidence of dissection, stenosis (50% or greater), or occlusion. Left carotid system: No evidence of dissection, stenosis (50% or greater), or occlusion. Vertebral arteries: Right-dominant. No evidence of dissection, stenosis (50% or greater), or occlusion. Skeleton: Severe odontogenic disease. Partially imaged right proximal humeral fracture. Other neck: Negative. The left internal jugular vein is noncontrast opacify, but the hypodensity seen on same day cervical spine CT is redemonstrated and remains worrisome for a DVT. Upper chest: There is a soft tissue mass along the right posterior third rib, compatible with  metastatic disease. Small right pleural effusion. Clustered nodularity in the right upper lobe with a consolidative perihilar opacity. 12 mm right axillary lymph node. A 2 mm paratracheal lymph node. See separately dictated CT chest abdomen and pelvis for additional findings. Review of the MIP images confirms the above findings CTA HEAD FINDINGS Anterior circulation: No proximal occlusion, aneurysm, or vascular malformation. 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 Moderate to severe focal stenosis in the proximal P2 segment of the left PCA. Venous sinuses: As permitted by contrast timing, patent. Anatomic variants: Fetal PCA on the left. Review of the MIP images confirms the above findings IMPRESSION: 1. No intracranial large vessel occlusion. 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 2. No hemodynamically significant stenosis in the neck. 3. The left internal jugular vein is not contrast opacified, but the hypodensity seen on same day cervical spine CT is redemonstrated and remains worrisome for a DVT. 4. See same day CT chest abdomen and pelvis for additional findings Electronically Signed   By: Lorenza Cambridge M.D.   On: 10/18/2023 13:03   DG Elbow 2 Views Right Result Date: 10/18/2023 CLINICAL DATA:  Fall.  Unwitnessed fall.  Right shoulder pain. EXAM: RIGHT ELBOW - 2 VIEW; RIGHT HAND - 2 VIEW COMPARISON:  None Available. FINDINGS: There is diffuse osteopenia of the visualized osseous structures. No acute fracture or dislocation. No aggressive osseous lesion. Mild-to-moderate degenerative changes of imaged joints with asymmetric moderate-to-severe involvement of first carpometacarpal joint. Triangular fibrocartilage complex chondrocalcinosis noted. No radiopaque foreign bodies. Soft tissues are within normal limits. IMPRESSION: *No acute osseous  abnormality of the right elbow or right hand. Electronically Signed   By: Jules Schick M.D.   On: 10/18/2023 11:11  DG Hand 2 View Right Result Date: 10/18/2023 CLINICAL DATA:  Fall.  Unwitnessed fall.  Right shoulder pain. EXAM: RIGHT ELBOW - 2 VIEW; RIGHT HAND - 2 VIEW COMPARISON:  None Available. FINDINGS: There is diffuse osteopenia of the visualized osseous structures. No acute fracture or dislocation. No aggressive osseous lesion. Mild-to-moderate degenerative changes of imaged joints with asymmetric moderate-to-severe involvement of first carpometacarpal joint. Triangular fibrocartilage complex chondrocalcinosis noted. No radiopaque foreign bodies. Soft tissues are within normal limits. IMPRESSION: *No acute osseous abnormality of the right elbow or right hand. Electronically Signed   By: Jules Schick M.D.   On: 10/18/2023 11:11   DG Shoulder Right Result Date: 10/18/2023 CLINICAL DATA:  Fall.  Right shoulder pain. EXAM: RIGHT SHOULDER - 2+ VIEW COMPARISON:  None Available. FINDINGS: Bone mineralization within normal limits for patient's age. There is mildly displaced fracture of the proximal right humerus. No other acute fracture or dislocation. No aggressive osseous lesion. Glenohumeral and acromioclavicular joints are normal in alignment and exhibit mild-to-moderate degenerative changes. No soft tissue swelling. No radiopaque foreign bodies. IMPRESSION: *Mildly displaced fracture of the proximal right humerus. Electronically Signed   By: Jules Schick M.D.   On: 10/18/2023 11:08   CT HEAD WO CONTRAST Result Date: 10/18/2023 CLINICAL DATA:  Blunt poly trauma EXAM: CT HEAD WITHOUT CONTRAST CT CERVICAL SPINE WITHOUT CONTRAST TECHNIQUE: Multidetector CT imaging of the head and cervical spine was performed following the standard protocol without intravenous contrast. Multiplanar CT image reconstructions of the cervical spine were also generated. RADIATION DOSE REDUCTION: This exam was performed  according to the departmental dose-optimization program which includes automated exposure control, adjustment of the mA and/or kV according to patient size and/or use of iterative reconstruction technique. COMPARISON:  06/04/2022 FINDINGS: CT HEAD FINDINGS Brain: No evidence of acute infarction, hemorrhage, hydrocephalus, extra-axial collection or mass lesion/mass effect. Vascular: No hyperdense vessel or unexpected calcification. Skull: Right frontal scalp swelling without fracture. Sinuses/Orbits: No evidence of injury. Mucosal thickening in the paranasal sinuses which is generalized and progressed from 06/04/2022 CT CERVICAL SPINE FINDINGS Alignment: Normal. Skull base and vertebrae: Negative for cervical spine fracture or subluxation. Progressive, destructive lesion in the right posterior third rib, known metastatic disease. Soft tissues and spinal canal: Expanded and peripherally high-density left internal jugular vein suspicious for thrombus. Disc levels:  Mild degenerative change. Upper chest: Partially covered right upper lobe opacity and adenopathy in the upper chest. IMPRESSION: 1. No evidence of acute intracranial or cervical spine injury. 2. Peripherally high-density left internal jugular vein suggesting thrombus. 3. 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 Electronically Signed   By: Tiburcio Pea M.D.   On: 10/18/2023 10:30   CT Cervical Spine Wo Contrast Result Date: 10/18/2023 CLINICAL DATA:  Blunt poly trauma EXAM: CT HEAD WITHOUT CONTRAST CT CERVICAL SPINE WITHOUT CONTRAST TECHNIQUE: Multidetector CT imaging of the head and cervical spine was performed following the standard protocol without intravenous contrast. Multiplanar CT image reconstructions of the cervical spine were also generated. RADIATION DOSE REDUCTION: This exam was performed according to the departmental dose-optimization program which includes automated exposure  control, adjustment of the mA and/or kV according to patient size and/or use of iterative reconstruction technique. COMPARISON:  06/04/2022 FINDINGS: CT HEAD FINDINGS Brain: No evidence of acute infarction, hemorrhage, hydrocephalus, extra-axial collection or mass lesion/mass effect. Vascular: No hyperdense vessel or unexpected calcification. Skull: Right frontal scalp swelling without fracture. Sinuses/Orbits: No evidence of injury. Mucosal thickening in the paranasal  sinuses which is generalized and progressed from 06/04/2022 CT CERVICAL SPINE FINDINGS Alignment: Normal. Skull base and vertebrae: Negative for cervical spine fracture or subluxation. Progressive, destructive lesion in the right posterior third rib, known metastatic disease. Soft tissues and spinal canal: Expanded and peripherally high-density left internal jugular vein suspicious for thrombus. Disc levels:  Mild degenerative change. Upper chest: Partially covered right upper lobe opacity and adenopathy in the upper chest. IMPRESSION: 1. No evidence of acute intracranial or cervical spine injury. 2. Peripherally high-density left internal jugular vein suggesting thrombus. 3. 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 Electronically Signed   By: Tiburcio Pea M.D.   On: 10/18/2023 10:30      Subjective: Seen and examined on day of DC.  Stable, appropriate for dc to SNF  Discharge Exam: Vitals:   10/26/23 2145 10/27/23 0742  BP: 136/70 125/70  Pulse: 82 66  Resp: 18   Temp: (!) 97.1 F (36.2 C) (!) 97.5 F (36.4 C)  SpO2: 99% 100%   Vitals:   10/26/23 0803 10/26/23 1622 10/26/23 2145 10/27/23 0742  BP: 135/86 (!) 140/65 136/70 125/70  Pulse: 89 95 82 66  Resp: 16 17 18    Temp: 98.4 F (36.9 C) 97.9 F (36.6 C) (!) 97.1 F (36.2 C) (!) 97.5 F (36.4 C)  TempSrc: Oral Oral Oral   SpO2: 96% 99% 99% 100%  Weight:      Height:        General: Pt is alert,  awake, not in acute distress Cardiovascular: RRR, S1/S2 +, no rubs, no gallops Respiratory: CTA bilaterally, no wheezing, no rhonchi Abdominal: Soft, NT, ND, bowel sounds + Extremities: no edema, no cyanosis    The results of significant diagnostics from this hospitalization (including imaging, microbiology, ancillary and laboratory) are listed below for reference.     Microbiology: Recent Results (from the past 240 hours)  Blood culture (single)     Status: None   Collection Time: 10/18/23 11:00 AM   Specimen: BLOOD  Result Value Ref Range Status   Specimen Description BLOOD LEFT ANTECUBITAL  Final   Special Requests   Final    BOTTLES DRAWN AEROBIC AND ANAEROBIC Blood Culture results may not be optimal due to an inadequate volume of blood received in culture bottles   Culture   Final    NO GROWTH 5 DAYS Performed at [facility], [address] [place], [place] [address]    Report Status 10/23/2023 FINAL  Final  Gastrointestinal Panel by PCR , Stool     Status: None   Collection Time: 10/18/23  5:34 PM   Specimen: Stool  Result Value Ref Range Status   Campylobacter species NOT DETECTED NOT DETECTED Final   Plesimonas shigelloides NOT DETECTED NOT DETECTED Final   Salmonella species NOT DETECTED NOT DETECTED Final   Yersinia enterocolitica NOT DETECTED NOT DETECTED Final   Vibrio species NOT DETECTED NOT DETECTED Final   Vibrio cholerae NOT DETECTED NOT DETECTED Final   Enteroaggregative E coli (EAEC) NOT DETECTED NOT DETECTED Final   Enteropathogenic E coli (EPEC) NOT DETECTED NOT DETECTED Final   Enterotoxigenic E coli (ETEC) NOT DETECTED NOT DETECTED Final   Shiga like toxin producing E coli (STEC) NOT DETECTED NOT DETECTED Final   Shigella/Enteroinvasive E coli (EIEC) NOT DETECTED NOT DETECTED Final   Cryptosporidium NOT DETECTED NOT DETECTED Final   Cyclospora cayetanensis NOT DETECTED NOT DETECTED Final   Entamoeba histolytica NOT DETECTED NOT  DETECTED  Final   Giardia lamblia NOT DETECTED NOT DETECTED Final   Adenovirus F40/41 NOT DETECTED NOT DETECTED Final   Astrovirus NOT DETECTED NOT DETECTED Final   Norovirus GI/GII NOT DETECTED NOT DETECTED Final   Rotavirus A NOT DETECTED NOT DETECTED Final   Sapovirus (I, II, IV, and V) NOT DETECTED NOT DETECTED Final    Comment: Performed at [facility], [address], [place], [place] [address]  C Difficile Quick Screen w PCR reflex     Status: None   Collection Time: 10/18/23  5:34 PM   Specimen: STOOL  Result Value Ref Range Status   C Diff antigen NEGATIVE NEGATIVE Final   C Diff toxin NEGATIVE NEGATIVE Final   C Diff interpretation No C. difficile detected.  Final    Comment: Performed at [facility], [address] [place], [place] [address]     Labs: BNP (last 3 results) No results for input(s): "BNP" in the last 8760 hours. Basic Metabolic Panel: Recent Labs  Lab 10/21/23 0522 10/25/23 0625  NA 135 137  K 3.6 4.3  CL 104 103  CO2 23 25  GLUCOSE 214* 131*  BUN 24* 18  CREATININE 0.54 0.50  CALCIUM 9.8 10.0  MG  --  1.6*   Liver Function Tests: No results for input(s): "AST", "ALT", "ALKPHOS", "BILITOT", "PROT", "ALBUMIN" in the last 168 hours. No results for input(s): "LIPASE", "AMYLASE" in the last 168 hours. No results for input(s): "AMMONIA" in the last 168 hours. CBC: Recent Labs  Lab 10/21/23 0522 10/25/23 0625  WBC 11.9* 10.5  NEUTROABS  --  7.7  HGB 10.9* 11.2*  HCT 35.0* 36.9  MCV 81.6 82.6  PLT 350 346   Cardiac Enzymes: No results for input(s): "CKTOTAL", "CKMB", "CKMBINDEX", "TROPONINI" in the last 168 hours. BNP: Invalid input(s): "POCBNP" CBG: Recent Labs  Lab 10/26/23 0803 10/26/23 1137 10/26/23 1625 10/26/23 2056 10/27/23 0747  GLUCAP 183* 243* 151* 97 84   D-Dimer No results for input(s): "DDIMER" in the last 72 hours. Hgb A1c No results for input(s): "HGBA1C" in the last 72 hours. Lipid Profile No results  for input(s): "CHOL", "HDL", "LDLCALC", "TRIG", "CHOLHDL", "LDLDIRECT" in the last 72 hours. Thyroid function studies No results for input(s): "TSH", "T4TOTAL", "T3FREE", "THYROIDAB" in the last 72 hours.  Invalid input(s): "FREET3" Anemia work up No results for input(s): "VITAMINB12", "FOLATE", "FERRITIN", "TIBC", "IRON", "RETICCTPCT" in the last 72 hours. Urinalysis    Component Value Date/Time   COLORURINE YELLOW (A) 10/18/2023 1336   APPEARANCEUR CLEAR (A) 10/18/2023 1336   LABSPEC >1.046 (H) 10/18/2023 1336   PHURINE 5.0 10/18/2023 1336   GLUCOSEU >=500 (A) 10/18/2023 1336   HGBUR NEGATIVE 10/18/2023 1336   BILIRUBINUR NEGATIVE 10/18/2023 1336   KETONESUR 20 (A) 10/18/2023 1336   PROTEINUR NEGATIVE 10/18/2023 1336   NITRITE POSITIVE (A) 10/18/2023 1336   LEUKOCYTESUR NEGATIVE 10/18/2023 1336   Sepsis Labs Recent Labs  Lab 10/21/23 0522 10/25/23 0625  WBC 11.9* 10.5   Microbiology Recent Results (from the past 240 hours)  Blood culture (single)     Status: None   Collection Time: 10/18/23 11:00 AM   Specimen: BLOOD  Result Value Ref Range Status   Specimen Description BLOOD LEFT ANTECUBITAL  Final   Special Requests   Final    BOTTLES DRAWN AEROBIC AND ANAEROBIC Blood Culture results may not be optimal due to an inadequate volume of blood received in culture bottles   Culture   Final  NO GROWTH 5 DAYS Performed at [facility], [address] [place], [place] [address]    Report Status 10/23/2023 FINAL  Final  Gastrointestinal Panel by PCR , Stool     Status: None   Collection Time: 10/18/23  5:34 PM   Specimen: Stool  Result Value Ref Range Status   Campylobacter species NOT DETECTED NOT DETECTED Final   Plesimonas shigelloides NOT DETECTED NOT DETECTED Final   Salmonella species NOT DETECTED NOT DETECTED Final   Yersinia enterocolitica NOT DETECTED NOT DETECTED Final   Vibrio species NOT DETECTED NOT DETECTED Final   Vibrio cholerae NOT DETECTED  NOT DETECTED Final   Enteroaggregative E coli (EAEC) NOT DETECTED NOT DETECTED Final   Enteropathogenic E coli (EPEC) NOT DETECTED NOT DETECTED Final   Enterotoxigenic E coli (ETEC) NOT DETECTED NOT DETECTED Final   Shiga like toxin producing E coli (STEC) NOT DETECTED NOT DETECTED Final   Shigella/Enteroinvasive E coli (EIEC) NOT DETECTED NOT DETECTED Final   Cryptosporidium NOT DETECTED NOT DETECTED Final   Cyclospora cayetanensis NOT DETECTED NOT DETECTED Final   Entamoeba histolytica NOT DETECTED NOT DETECTED Final   Giardia lamblia NOT DETECTED NOT DETECTED Final   Adenovirus F40/41 NOT DETECTED NOT DETECTED Final   Astrovirus NOT DETECTED NOT DETECTED Final   Norovirus GI/GII NOT DETECTED NOT DETECTED Final   Rotavirus A NOT DETECTED NOT DETECTED Final   Sapovirus (I, II, IV, and V) NOT DETECTED NOT DETECTED Final    Comment: Performed at [facility], [address] [place], [place] [address]  C Difficile Quick Screen w PCR reflex     Status: None   Collection Time: 10/18/23  5:34 PM   Specimen: STOOL  Result Value Ref Range Status   C Diff antigen NEGATIVE NEGATIVE Final   C Diff toxin NEGATIVE NEGATIVE Final   C Diff interpretation No C. difficile detected.  Final    Comment: Performed at [facility], [address] [place], [place] [address]     Time coordinating discharge: Over 30 minutes  SIGNED:   Tresa Moore, MD  [facility] 10/27/2023, 11:13 AM Pager   If 7PM-7AM, please contact night-coverage

## 2023-10-27 NOTE — Plan of Care (Signed)
   Problem: Education: Goal: Knowledge of General Education information will improve Description Including pain rating scale, medication(s)/side effects and non-pharmacologic comfort measures Outcome: Progressing   Problem: Activity: Goal: Risk for activity intolerance will decrease Outcome: Progressing   Problem: Safety: Goal: Ability to remain free from injury will improve Outcome: Progressing

## 2024-03-06 DEATH — deceased

## 2024-04-02 ENCOUNTER — Encounter: Payer: Self-pay | Admitting: Oncology

## 2024-04-17 ENCOUNTER — Other Ambulatory Visit: Payer: Self-pay
